# Patient Record
Sex: Female | Born: 1945 | Race: White | Hispanic: No | Marital: Married | Smoking: Former smoker
Health system: Southern US, Community
[De-identification: ages and names within clinical notes are randomized; demographics above are authoritative.]

## PROBLEM LIST (undated history)

## (undated) DIAGNOSIS — F419 Anxiety disorder, unspecified: Secondary | ICD-10-CM

## (undated) DIAGNOSIS — K859 Acute pancreatitis without necrosis or infection, unspecified: Secondary | ICD-10-CM

## (undated) DIAGNOSIS — Z86718 Personal history of other venous thrombosis and embolism: Secondary | ICD-10-CM

## (undated) DIAGNOSIS — I774 Celiac artery compression syndrome: Secondary | ICD-10-CM

## (undated) DIAGNOSIS — B029 Zoster without complications: Secondary | ICD-10-CM

## (undated) DIAGNOSIS — D689 Coagulation defect, unspecified: Secondary | ICD-10-CM

## (undated) DIAGNOSIS — M810 Age-related osteoporosis without current pathological fracture: Secondary | ICD-10-CM

## (undated) DIAGNOSIS — M8580 Other specified disorders of bone density and structure, unspecified site: Secondary | ICD-10-CM

## (undated) DIAGNOSIS — I771 Stricture of artery: Secondary | ICD-10-CM

## (undated) DIAGNOSIS — I499 Cardiac arrhythmia, unspecified: Secondary | ICD-10-CM

## (undated) DIAGNOSIS — H3530 Unspecified macular degeneration: Secondary | ICD-10-CM

## (undated) DIAGNOSIS — R609 Edema, unspecified: Secondary | ICD-10-CM

## (undated) DIAGNOSIS — K219 Gastro-esophageal reflux disease without esophagitis: Secondary | ICD-10-CM

## (undated) DIAGNOSIS — D735 Infarction of spleen: Secondary | ICD-10-CM

## (undated) DIAGNOSIS — M1990 Unspecified osteoarthritis, unspecified site: Secondary | ICD-10-CM

## (undated) DIAGNOSIS — C50919 Malignant neoplasm of unspecified site of unspecified female breast: Secondary | ICD-10-CM

## (undated) HISTORY — PX: MENISCUS REPAIR: SHX5179

## (undated) HISTORY — DX: Personal history of other venous thrombosis and embolism: Z86.718

## (undated) HISTORY — DX: Malignant neoplasm of unspecified site of unspecified female breast: C50.919

## (undated) HISTORY — DX: Zoster without complications: B02.9

## (undated) HISTORY — DX: Stricture of artery: I77.1

## (undated) HISTORY — DX: Coagulation defect, unspecified: D68.9

## (undated) HISTORY — DX: Anxiety disorder, unspecified: F41.9

## (undated) HISTORY — PX: BREAST LUMPECTOMY: SHX2

## (undated) HISTORY — PX: TONSILLECTOMY: SUR1361

## (undated) HISTORY — DX: Age-related osteoporosis without current pathological fracture: M81.0

## (undated) HISTORY — DX: Edema, unspecified: R60.9

## (undated) HISTORY — DX: Unspecified macular degeneration: H35.30

## (undated) HISTORY — DX: Unspecified osteoarthritis, unspecified site: M19.90

## (undated) HISTORY — DX: Celiac artery compression syndrome: I77.4

## (undated) HISTORY — DX: Infarction of spleen: D73.5

## (undated) HISTORY — DX: Other specified disorders of bone density and structure, unspecified site: M85.80

## (undated) HISTORY — DX: Cardiac arrhythmia, unspecified: I49.9

## (undated) HISTORY — DX: Acute pancreatitis without necrosis or infection, unspecified: K85.90

---

## 1998-02-26 ENCOUNTER — Other Ambulatory Visit: Admission: RE | Admit: 1998-02-26 | Discharge: 1998-02-26 | Payer: Self-pay | Admitting: *Deleted

## 1999-02-27 ENCOUNTER — Other Ambulatory Visit: Admission: RE | Admit: 1999-02-27 | Discharge: 1999-02-27 | Payer: Self-pay | Admitting: *Deleted

## 2000-03-31 ENCOUNTER — Other Ambulatory Visit: Admission: RE | Admit: 2000-03-31 | Discharge: 2000-03-31 | Payer: Self-pay | Admitting: *Deleted

## 2001-04-11 ENCOUNTER — Other Ambulatory Visit: Admission: RE | Admit: 2001-04-11 | Discharge: 2001-04-11 | Payer: Self-pay | Admitting: *Deleted

## 2001-04-12 ENCOUNTER — Other Ambulatory Visit: Admission: RE | Admit: 2001-04-12 | Discharge: 2001-04-12 | Payer: Self-pay | Admitting: Radiology

## 2001-04-22 ENCOUNTER — Encounter: Payer: Self-pay | Admitting: Surgery

## 2001-04-22 ENCOUNTER — Ambulatory Visit (HOSPITAL_BASED_OUTPATIENT_CLINIC_OR_DEPARTMENT_OTHER): Admission: RE | Admit: 2001-04-22 | Discharge: 2001-04-22 | Payer: Self-pay | Admitting: Surgery

## 2001-04-22 ENCOUNTER — Encounter (INDEPENDENT_AMBULATORY_CARE_PROVIDER_SITE_OTHER): Payer: Self-pay | Admitting: *Deleted

## 2001-05-02 ENCOUNTER — Ambulatory Visit: Admission: RE | Admit: 2001-05-02 | Discharge: 2001-07-31 | Payer: Self-pay | Admitting: Radiation Oncology

## 2001-11-02 HISTORY — PX: FOOT SURGERY: SHX648

## 2002-05-02 ENCOUNTER — Other Ambulatory Visit: Admission: RE | Admit: 2002-05-02 | Discharge: 2002-05-02 | Payer: Self-pay | Admitting: *Deleted

## 2002-05-18 ENCOUNTER — Ambulatory Visit (HOSPITAL_BASED_OUTPATIENT_CLINIC_OR_DEPARTMENT_OTHER): Admission: RE | Admit: 2002-05-18 | Discharge: 2002-05-18 | Payer: Self-pay | Admitting: Orthopedic Surgery

## 2002-10-19 ENCOUNTER — Ambulatory Visit (HOSPITAL_BASED_OUTPATIENT_CLINIC_OR_DEPARTMENT_OTHER): Admission: RE | Admit: 2002-10-19 | Discharge: 2002-10-19 | Payer: Self-pay | Admitting: Orthopedic Surgery

## 2003-11-19 ENCOUNTER — Other Ambulatory Visit: Admission: RE | Admit: 2003-11-19 | Discharge: 2003-11-19 | Payer: Self-pay | Admitting: *Deleted

## 2004-12-26 ENCOUNTER — Ambulatory Visit: Payer: Self-pay | Admitting: Oncology

## 2005-06-24 ENCOUNTER — Other Ambulatory Visit: Admission: RE | Admit: 2005-06-24 | Discharge: 2005-06-24 | Payer: Self-pay | Admitting: *Deleted

## 2005-06-29 ENCOUNTER — Ambulatory Visit: Payer: Self-pay | Admitting: Oncology

## 2005-12-29 ENCOUNTER — Ambulatory Visit: Payer: Self-pay | Admitting: Oncology

## 2006-05-31 ENCOUNTER — Other Ambulatory Visit: Admission: RE | Admit: 2006-05-31 | Discharge: 2006-05-31 | Payer: Self-pay | Admitting: Radiology

## 2006-07-01 ENCOUNTER — Ambulatory Visit: Payer: Self-pay | Admitting: Oncology

## 2006-07-06 LAB — COMPREHENSIVE METABOLIC PANEL
ALT: 23 U/L (ref 0–40)
BUN: 18 mg/dL (ref 6–23)
CO2: 26 mEq/L (ref 19–32)
Calcium: 9.5 mg/dL (ref 8.4–10.5)
Chloride: 104 mEq/L (ref 96–112)
Creatinine, Ser: 0.82 mg/dL (ref 0.40–1.20)
Glucose, Bld: 115 mg/dL — ABNORMAL HIGH (ref 70–99)

## 2006-07-06 LAB — CBC WITH DIFFERENTIAL/PLATELET
BASO%: 0.3 % (ref 0.0–2.0)
Basophils Absolute: 0 10*3/uL (ref 0.0–0.1)
Eosinophils Absolute: 0.3 10*3/uL (ref 0.0–0.5)
HCT: 41.3 % (ref 34.8–46.6)
HGB: 14.9 g/dL (ref 11.6–15.9)
LYMPH%: 17.5 % (ref 14.0–48.0)
MONO#: 0.6 10*3/uL (ref 0.1–0.9)
NEUT#: 6.6 10*3/uL — ABNORMAL HIGH (ref 1.5–6.5)
NEUT%: 72.7 % (ref 39.6–76.8)
Platelets: 312 10*3/uL (ref 145–400)
WBC: 9.1 10*3/uL (ref 3.9–10.0)
lymph#: 1.6 10*3/uL (ref 0.9–3.3)

## 2006-08-25 ENCOUNTER — Other Ambulatory Visit: Admission: RE | Admit: 2006-08-25 | Discharge: 2006-08-25 | Payer: Self-pay | Admitting: *Deleted

## 2007-11-30 ENCOUNTER — Other Ambulatory Visit: Admission: RE | Admit: 2007-11-30 | Discharge: 2007-11-30 | Payer: Self-pay | Admitting: *Deleted

## 2008-08-02 ENCOUNTER — Ambulatory Visit: Payer: Self-pay | Admitting: Internal Medicine

## 2008-08-30 ENCOUNTER — Ambulatory Visit: Payer: Self-pay | Admitting: Internal Medicine

## 2008-09-25 ENCOUNTER — Ambulatory Visit: Payer: Self-pay | Admitting: Internal Medicine

## 2008-11-13 ENCOUNTER — Ambulatory Visit: Payer: Self-pay | Admitting: Internal Medicine

## 2008-12-31 ENCOUNTER — Ambulatory Visit: Payer: Self-pay | Admitting: Internal Medicine

## 2009-01-22 ENCOUNTER — Ambulatory Visit: Payer: Self-pay | Admitting: Internal Medicine

## 2009-03-22 ENCOUNTER — Ambulatory Visit: Payer: Self-pay | Admitting: Internal Medicine

## 2009-04-18 ENCOUNTER — Ambulatory Visit: Payer: Self-pay | Admitting: Internal Medicine

## 2009-08-27 ENCOUNTER — Ambulatory Visit: Payer: Self-pay | Admitting: Internal Medicine

## 2009-10-31 ENCOUNTER — Ambulatory Visit: Payer: Self-pay | Admitting: Internal Medicine

## 2009-12-23 ENCOUNTER — Ambulatory Visit: Payer: Self-pay | Admitting: Internal Medicine

## 2010-01-23 ENCOUNTER — Ambulatory Visit: Payer: Self-pay | Admitting: Internal Medicine

## 2010-01-23 ENCOUNTER — Encounter: Payer: Self-pay | Admitting: Internal Medicine

## 2010-03-06 ENCOUNTER — Ambulatory Visit: Payer: Self-pay | Admitting: Internal Medicine

## 2010-03-06 DIAGNOSIS — J328 Other chronic sinusitis: Secondary | ICD-10-CM

## 2010-03-06 DIAGNOSIS — J309 Allergic rhinitis, unspecified: Secondary | ICD-10-CM | POA: Insufficient documentation

## 2010-03-06 LAB — CONVERTED CEMR LAB: IgE (Immunoglobulin E), Serum: 228.6 intl units/mL — ABNORMAL HIGH (ref 0.0–180.0)

## 2010-03-07 LAB — CONVERTED CEMR LAB
Basophils Absolute: 0 10*3/uL (ref 0.0–0.1)
Basophils Relative: 0.4 % (ref 0.0–3.0)
Eosinophils Absolute: 0.4 10*3/uL (ref 0.0–0.7)
Eosinophils Relative: 4.1 % (ref 0.0–5.0)
HCT: 43.2 % (ref 36.0–46.0)
Hemoglobin: 15.3 g/dL — ABNORMAL HIGH (ref 12.0–15.0)
Lymphocytes Relative: 20.1 % (ref 12.0–46.0)
Lymphs Abs: 2.1 10*3/uL (ref 0.7–4.0)
MCHC: 35.3 g/dL (ref 30.0–36.0)
MCV: 92.7 fL (ref 78.0–100.0)
Monocytes Absolute: 0.7 10*3/uL (ref 0.1–1.0)
Monocytes Relative: 6.9 % (ref 3.0–12.0)
Neutro Abs: 7.2 10*3/uL (ref 1.4–7.7)
Neutrophils Relative %: 68.5 % (ref 43.0–77.0)
Platelets: 296 10*3/uL (ref 150.0–400.0)
RBC: 4.66 M/uL (ref 3.87–5.11)
RDW: 12.4 % (ref 11.5–14.6)
WBC: 10.5 10*3/uL (ref 4.5–10.5)

## 2010-04-10 ENCOUNTER — Ambulatory Visit: Payer: Self-pay | Admitting: Internal Medicine

## 2010-04-25 ENCOUNTER — Encounter: Payer: Self-pay | Admitting: Internal Medicine

## 2010-07-17 ENCOUNTER — Ambulatory Visit: Payer: Self-pay | Admitting: Internal Medicine

## 2010-08-12 ENCOUNTER — Ambulatory Visit: Payer: Self-pay | Admitting: Internal Medicine

## 2010-12-04 NOTE — Assessment & Plan Note (Signed)
Summary: allergy problem/ mbw   Vital Signs:  Patient profile:   65 year old female Weight:      171 pounds O2 Sat:      96 % on Room air Pulse rate:   84 / minute BP sitting:   126 / 66  (left arm) Cuff size:   regular  Vitals Entered By: Reynaldo Minium CMA (Mar 06, 2010 2:45 PM)  O2 Flow:  Room air  Primary Provider/Referring Provider:  Baxley  CC:  Allergy Consult-Dr.Baxley.  History of Present Illness: Mar 06, 2010- 64 yoF seen courtesy of Dr Lenord Fellers, concerned about recurrent sinus problems and possible allergy. She denies any hx of allergy or sinus problem before about 8 years ago when she had dental surgery on an upper front tooth- relevance unclear.. Since then she's had occasional congestion especially in Winter and Spring. In the last 2 years this has been more pronounced and maybe less clearly seasonal. She describes rhinorhea, sneeze, watery eyes, ear pressure. Occasionally some dyspnea, but no cough or wheeze. She denies bloody or purulent discharge. Treated repeatedly with Z paks for otitis and sinusitis, but says it doesn't seem to work as well as it did.   Current Medications (verified): 1)  Spironolactone 100 Mg Tabs (Spironolactone) .... Take 1 By Mouth Once Daily 2)  Ocuvite  Tabs (Multiple Vitamins-Minerals) .... Take 1 By Mouth Once Daily 3)  B-100 Complex  Tabs (Vitamins-Lipotropics) .... Take 1 By Mouth Once Daily 4)  Caltrate 600+d 600-400 Mg-Unit Tabs (Calcium Carbonate-Vitamin D) .... Take 1 By Mouth Once Daily  Allergies (verified): 1)  ! Codeine 2)  ! Pcn 3)  ! * Tequin 4)  ! Left Arm Bp Ck Only  Family History: Cancer-self Heart Disease-Aunt  Social History: Married with children Smoked x 38 years 1ppd; quit 2002 Self Employed   Impression & Recommendations:  Problem # 1:  ALLERGIC RHINITIS (ICD-477.9)  Adutlt onset rhinitis with waqtering eyues,eustachian dysfunction. She doesn't clearly recognize a pattrern beyong Winter/Spring, so much  of this may be viral.She has had a lot of Z paks and I can't exclude a choronic sinusitis. Eventually she may need a sinus CT. For now we will send an IgE assay and let her try allegra or Claritin, since she doesn't like the way Zyrtec makes her feel. Consider a steroid nasal spray and/ or Singulair. Consider skin testing.  Orders: Consultation Level IV [id number]) T-Allergy Profile Region II-[place], [place], [place], [place], [place] (5484) TLB-CBC Platelet - w/Differential (85025-CBCD)  Medications Added to Medication List This Visit: 1)  Spironolactone 100 Mg Tabs (Spironolactone) .... Take 1 by mouth once daily 2)  Ocuvite Tabs (Multiple vitamins-minerals) .... Take 1 by mouth once daily 3)  B-100 Complex Tabs (Vitamins-lipotropics) .... Take 1 by mouth once daily 4)  Caltrate 600+d 600-400 Mg-unit Tabs (Calcium carbonate-vitamin d) .... Take 1 by mouth once daily  Patient Instructions: 1)  Please schedule a follow-up appointment in 1 month. 2)  Consider a different antihistamine instead of Zyrtec since you don't like the way it makes you feel. 3)  Allegra/ fexofenadine  12 or 24 hour 4)  Claritin/ loratadine 5)  Try otc decongestant like Sudafed or Sudafed-PE 6)  Lab  Appended Document: allergy problem/ mbw     Primary Provider/Referring Provider:  Baxley   History of Present Illness: This is a rewrite- original closed before proof reading. 03/06/10- 64yoF seen in allergy consultation at kind request of Dr Lenord Fellers, concerned about recurrent sinus problems  and possible allergy. She denies any hx of allergy or sinus problems before about 8 years ago,when she had dental surgery on an upper front tooth- relevance unclear. Since then she's had occasional congestion especialy in winter and spring. In the last 2 years this has been more pronounced and maybe less clearly seasonal. She describes rhinorhea, sneeze, wateryeyes, ear pressure. Occasionally some dyspnea, but no cough or wheeze.She denies bloody or purulent  discharge. She snores withut reported apnea. Treated several times for sinus headache and otitis with Zpaks, but says it doesn't seem to work as well as it did.  Zyrtec and sudafed worked well, but she blamed them for stomach upset, possibly taking antibioitic at the same  time. Dislikes the way Zyrtec makes her feel less energetic. had take Miacalcin in the past but it irritated her nose. Prednisone gave her too much energy- unpleasant. Has had episodes of epsitaxis for which she will use short term Afrin. Has had 2 episodes of vertigo. There had been concern about water under the house in past- remediated and now dry without mold or mildew.  Allergies: 1)  ! Codeine 2)  ! Pcn 3)  ! * Tequin 4)  ! 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  The patient denies anorexia, fever, weight loss, weight gain, vision loss, decreased hearing, hoarseness, chest pain, syncope, peripheral edema, prolonged cough, hemoptysis, abdominal pain, and severe indigestion/heartburn.         palpitation toothache  Vital Signs:  Patient profile:   65 year old female Weight:      171 pounds O2 Sat:      96 % on Room air Pulse rate:   84 / minute Resp:     18 per minute BP sitting:   126 / 66  (left arm) Cuff size:   regular  O2 Flow:  Room air  Physical  Exam  Additional Exam:  General: A/Ox3; pleasant and cooperative, NAD, calm SKIN: no rash, lesions NODES: no lymphadenopathy HEENT: [place]/AT, EOM- WNL, Conjuctivae- clear, PERRLA, TM-WNL, Nose- turbinate edema, Throat- clear and wnl, Mallampati  III, no drainage NECK: Supple w/ fair ROM, JVD- none, normal carotid impulses w/o bruits Thyroid- normal to palpation CHEST: Clear to P&A HEART: RRR, no m/g/r heard ABDOMEN: Soft and nl; nml bowel sounds; no organomegaly or masses noted GNF:AOZH, nl pulses, no edema  NEURO: Grossly intact to observation      Impression & Recommendations:  Problem # 1:  RHINOSINUSITIS, CHRONIC (ICD-473.8)  Adult onset rhinitis with watering eyes,eustachian dysfunction. She doesn't clearly recognize a pattern beyond Winter/Spring, so much of this may be viral. She has had a lot of Z paks and I can't exclude a chronic sinusitis. Eventually she may need a sinus CT. For now we will send an IgE assay and let her try allegra or Claritin, since she doesn't like the way  Zyrtec makes her feel. Consider a steroid nasal spray and/ or Singulair. Consider skin testing.  Orders: Consultation Level IV (16109)  Other Orders: TLB-CBC Platelet - w/Differential (85025-[id number]) T-Allergy Profile Region II-[place], [place], [place], [place], [place] [id number])  Patient Instructions: 1)  Please schedule a follow-up appointment in 1 month. 2)  Consider a different antihistamine for trial instead of Zyrtec, since you don't like the way it makes you feel. 3)  Claritin/ loratadine 4)  Allegra/ fexofenadine 12 or 24 hour 5)  Lab

## 2010-12-04 NOTE — Letter (Signed)
Summary: Date Range: 12-23-09 to 01-23-10/Mary Waymond Cera MD  Date Range: 12-23-09 to 01-23-10/Mary Waymond Cera MD   Imported By: Sherian Rein 03/10/2010 10:52:37  _____________________________________________________________________  External Attachment:    Type:   Image     Comment:   External Document

## 2010-12-04 NOTE — Assessment & Plan Note (Signed)
Summary: rov 1 month///kp   Primary Provider/Referring Provider:  Lenord Fellers  CC:  1 month follow up visit-allergies. .  History of Present Illness: CC:  Allergy Consult-Dr.Baxley.  History of Present Illness: Mar 06, 2010- 65 yoF seen courtesy of Dr Lenord Fellers, concerned about recurrent sinus problems and possible allergy. She denies any hx of allergy or sinus problem before about 8 years ago when she had dental surgery on an upper front tooth- relevance unclear.. Since then she's had occasional congestion especially in Winter and Spring. In the last 2 years this has been more pronounced and maybe less clearly seasonal. She describes rhinorhea, sneeze, watery eyes, ear pressure. Occasionally some dyspnea, but no cough or wheeze. She denies bloody or purulent discharge. Treated repeatedly with Z paks for otitis and sinusitis, but says it doesn't seem to work as well as it did.  April 10, 2010- Recurrent rhinosinusitis As usual, she is doing well in the summer. We discussed her experience with congestion and need for Sudafed especially in the winter time. In the winter she gets a "sinus" headache, that she notes inmproves on travel to [place], and worsens again as they come back into the [place], not necessarily back to her home.  Miacalcin irritated her nose- epistaxis and made eyes water. Admits some tendency to snore.   Preventive Screening-Counseling & Management  Alcohol-Tobacco     Smoking Status: quit     Year Started: 1968     Year Quit: 2003  Current Medications (verified): 1)  Spironolactone 100 Mg Tabs (Spironolactone) .... Take 1 By Mouth Once Daily 2)  Ocuvite  Tabs (Multiple Vitamins-Minerals) .... Take 1 By Mouth Once Daily 3)  B-100 Complex  Tabs (Vitamins-Lipotropics) .... Take 1 By Mouth Once Daily 4)  Caltrate 600+d 600-400 Mg-Unit Tabs (Calcium Carbonate-Vitamin D) .... Take 1 By Mouth Once Daily 5)  Vision Formula  Tabs (Multiple Vitamins-Minerals) .... Take 2 By Mouth  Once Daily  Allergies (verified): 1)  ! Codeine 2)  ! Pcn 3)  ! * Tequin 4)  ! 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  The patient denies shortness of breath with activity, shortness of breath at rest, productive cough, non-productive cough, coughing up blood, chest pain, irregular heartbeats, acid heartburn, indigestion, loss of appetite, weight change, abdominal pain, difficulty swallowing, sore throat, tooth/dental problems, and headaches.    Vital Signs:  Patient profile:   65 year old female O2 Sat:      97 % on Room air Pulse rate:   86 / minute BP sitting:   122 / 74  (left arm) Cuff size:   regular  Vitals Entered By: Reynaldo Minium CMA (April 10, 2010 1:57 PM)  O2 Flow:  Room air CC: 1 month follow up visit-allergies.    Physical Exam  Additional Exam:  General: A/Ox3; pleasant and cooperative, NAD, calm SKIN:  no rash, lesions NODES: no lymphadenopathy HEENT: [place]/AT, EOM- WNL, Conjuctivae- clear, PERRLA, TM-WNL, Nose- turbinate edema, Throat- clear and wnl, Mallampati  III, no drainage NECK: Supple w/ fair ROM, JVD- none, normal carotid impulses w/o bruits Thyroid- normal to palpation CHEST: Clear to P&A HEART: RRR, no m/g/r heard ABDOMEN: Soft and nl; ZOX:WRUE, nl pulses, no edema  NEURO: Grossly  intact to observation      Impression & Recommendations:  Problem # 1:  ALLERGIC RHINITIS (ICD-477.9)  Based on specific IgE I can't identify an allergy pattern to explain winter symptoms and her elevated total IgE. She is worried about hx of epistaxis that may have been related to winter drying and to Miacalcin. She is interested in seeing if an ENT  could cauterize a bleeding-prone area before next winter. Family had a good general ENT experience with Dr Dorma Russell. If there isn't much concern about epistaxis, I would think to offer a nasal streroid next winter if needed.  Problem # 2:  RHINOSINUSITIS, CHRONIC (ICD-473.8) There may be a chronic irritant/ vasomotor component. anatomic narrowing can be assessed on ENT exam.  Medications Added to Medication List This Visit: 1)  Vision Formula Tabs (Multiple vitamins-minerals) .... Take 2 by mouth once daily  Other Orders: Est. Patient Level IV (45409) ENT Referral (ENT)  Patient Instructions: 1)  Please schedule a follow-up appointment as needed. 2)  See [facility] for ENT referral

## 2010-12-04 NOTE — Consult Note (Signed)
Summary: [facility]  [facility]   Imported By: Sherian Rein 05/01/2010 07:56:41  _____________________________________________________________________  External Attachment:    Type:   Image     Comment:   External Document

## 2010-12-09 ENCOUNTER — Ambulatory Visit (INDEPENDENT_AMBULATORY_CARE_PROVIDER_SITE_OTHER): Payer: Medicare Other | Admitting: Internal Medicine

## 2010-12-09 DIAGNOSIS — J069 Acute upper respiratory infection, unspecified: Secondary | ICD-10-CM

## 2010-12-09 DIAGNOSIS — H6690 Otitis media, unspecified, unspecified ear: Secondary | ICD-10-CM

## 2011-03-20 NOTE — Op Note (Signed)
[place]  Patient:    Annette Beltran, Annette Beltran                          MRN: [id number] Proc. Date: 04/22/01 Adm. Date:  [id number] Disc. Date: [id number] Attending:  Charlton Haws CC:         Darius Bump, M.D.  Almedia Balls. Randell Patient, M.D.  Jeralyn Ruths, M.D.   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  PROCEDURE:  Right partial mastectomy with sentinel lymph node dissection and blue dye injection.  SURGEON:  Currie Paris, M.D.  ANESTHESIA:  General.  CLINICAL HISTORY:  This patient is a 65 year old who was recently found to have a right breast mass which on biopsy showed carcinoma, possibly tubular. After discussion with the patient and alternatives we elected to proceed to a right partial mastectomy/lumpectomy with blue dye injection and sentinel node dissection.  DESCRIPTION OF PROCEDURE:  The patient was seen in the holding area and questions reviewed and answered.  She was then taken to the operating room and after satisfactory general anesthesia the right breast was prepped and draped. Approximately 4 cc lymphazurin blue dye was injected subareolarly and a little bit in the dermis in the prior biopsy site in the upper outer quadrant of the breast.  This was massaged in for a few minutes.  Then using a neoprobe I found a hot area in the axilla, made a transverse low axillary incision directly over this.  The subcutaneous tissues were divided and as I got into the axillary fat I did see two blue lymphatics both of which I traced a little further up into the axillae and found a cluster of blue nodes that I could not really separate one from the other but which were quite hot.  These were pulled out en block and I trimmed some of the fat off but could not really identify  the planes of dissection readily.  I sent this as a hot blue sentinel nodes.  The second blue lymphatic, a little bit posteriorly, was identified, and using a neoprobe I found another hot area adjacent to that and found what appeared to be two nodes, again stuck together and these were excised.  There was no palpable other adenopathy nor any other blue hot areas and counts returned to baseline at 0-15 with the nodes being as high as 1500.  A pack was placed here while waiting for pathology.  Attention was returned to the breast.  There was a prior core biopsy site in the upper outer quadrant and this was directly over the tumor so I made an elliptical incision over and to include the core site.  Using cautery I took a wide excision going down the chest wall leaving only a few mm of fat on the deep margin.  By palpation I felt like I was well around this in all directions although it felt a little close along the superior margins so I took another 3 mm of superior margin.  I was almost into the axillae in doing so and there was really no breast tissue there.  There was some breast tissue inferiorly but it was felt to be normal.  This was sent for touch preps and for pathology to look at.  The wound was irrigated.  I put some  clips in to mark the margins of excision and closed with some 3-0 Vicryl.  The axillary incision was also closed with 3-0 Vicryl.  The skin was closed with 4-0 monacryl in both sites.  I injected with Marcaine prior to closing to help with postoperatively analgesia.  The patient tolerated the procedure well.  Pathology reported that the sentinel nodes were negative and the margin appeared to be adequate on examination and sectioning of the specimen.  The patient tolerated the procedure well.  There were no operative complications.  All counts were correct. DD:  04/22/01 TD:  04/24/01 Job: [id number]

## 2011-03-20 NOTE — Op Note (Signed)
Annette Beltran, Annette Beltran                           ACCOUNT NO.:  [id number]   MEDICAL RECORD NO.:  [id number]                   PATIENT TYPE:  AMB   LOCATION:  DSC                                  FACILITY:  [facility]   PHYSICIAN:  Nadara Mustard, M.D.                DATE OF BIRTH:  November 09, 1945   DATE OF PROCEDURE:  10/19/2002  DATE OF DISCHARGE:                                 OPERATIVE REPORT   PREOPERATIVE DIAGNOSES:  Hallux rigidus, left great toe.   POSTOPERATIVE DIAGNOSES:  Hallux rigidus, left great toe.   PROCEDURE:  Left great toe cheilectomy.   SURGEON:  Nadara Mustard, M.D.   ANESTHESIA:  LMA.   ESTIMATED BLOOD LOSS:  Minimal.   TOURNIQUET TIME:  Esmarch at the ankle for approximately 22 minutes.   ANTIBIOTICS:  1 gm of Kefzol.   DISPOSITION:  To PACU in a stable condition.   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  She is status post a fusion of the  right great toe and presents at this time for a cheilectomy of the left  great toe.  She does have a noncongruent joint with significant degenerative  changes.  The options of cheilectomy versus fusion were discussed, and the  patient wished to proceed with a cheilectomy.  The risks and benefits were  discussed, including infection, neurovascular injury, persistent pain, and  need for additional surgery.  The patient states that he understands and  wishes to proceed at this time.   PROCEDURE:  The patient was brought to OR room #5 and underwent a general  LMA anesthetic.  After an adequate level of anesthesia had been obtained,  the patient's left lower extremity was prepped using Duraprep and draped in  a sterile field, and Ioban was used to cover all exposed skin.  A dorsal  incision was made longitudinally over the MCP joint of the great toe.  This  was carried down to the joint.  A subperiosteal dissection was then  performed around the edges  of the joint to expose the MCP joint.  Using an  oscillating saw and an osteotome, the distal osteophytic spurs were removed  from the distal metatarsal head.  Osteophytic spurs off the proximal first  phalanx were also removed, and ostephytic spurs off the medial and lateral  edges of the MCP joint were also debrided on both sides of the joint.  The  wound was irrigated with normal saline.  Loose bodies were removed.  The  patient preoperatively had a total of 10 degrees of range of motion, and  postoperatively, he had 70 degrees of dorsiflexion.  After the wound was  irrigated, the incision was closed with a vertical mattress with 2-0 nylon.  The wound was covered with Adaptic orthopedic sponges, sterile Webril, and a  loosely wrapped Coban  dressing was  applied.  The patient was extubated and taken to PACU in stable condition.  Plans for non-weightbearing.  Followup in the office in two weeks.  She may  bathe at three weeks and weightbearing as tolerated at two weeks with  progressive range of motion.                                               Nadara Mustard, M.D.    MVD/MEDQ  D:  10/19/2002  T:  10/20/2002  Job:  [id number]

## 2011-03-20 NOTE — Op Note (Signed)
[place]  Patient:    Annette Beltran, Annette Beltran Visit Number: [id number] MRN: [id number]          Service Type: DSU Location: [facility] Attending Physician:  Nadara Mustard Dictated by:   Nadara Mustard, M.D. Proc. Date: 05/18/02 Admit Date:  05/18/2002 Discharge Date: 05/18/2002                             Operative Report  PREOPERATIVE DIAGNOSIS:  Severe hallux rigis, right great toe.  POSTOPERATIVE DIAGNOSIS:  Severe hallux rigis, right great toe.  PROCEDURE:  Right great toe MTP fusion.  SURGEON:  Nadara Mustard, M.D.  ANESTHESIA:  General LMA.  ESTIMATED BLOOD LOSS:  Minimal.  ANTIBIOTICS: One gram of Kefzol.  TOURNIQUET TIME:  Forty-one minutes with the Esmarch at the ankle.  DRAINS:  None.  COMPLICATIONS:  None.  DISPOSITION:  To PACU in stable condition.  INDICATIONS OF PROCEDURE:  The patient is a 65 year old woman who is status post a cheilectomy for a hallux rigis of her right great toe.  The patient has failed further conservative care.  She has essentially no range of motion of the MTP joint.  She has large osteophytic bone spurs and presents at this time for surgical intervention.  The risks and benefits were discussed including infection, neurovascular injury, persistent pain, failure of the fusion, or need for additional surgery.  The patient states that she understands and wishes to proceed at this time.  DESCRIPTION OF PROCEDURE:  The patient was brought to OR #5 and underwent a general anesthetic.  After adequate level of anesthesia was obtained, the patients right lower extremity was prepped using DuraPrep and draped into a sterile field.  Collier Flowers was used to cover all exposed skin.  She previously had a dorsal curvilinear incision which was used for her chielectomy.  While this was not in the optimal position, this was used to minimize the risk of wound problems due to the small area of the toe.  The previous incision was  used. This was carried sharply down to bone.  The large osteophytes were removed using an oscillating saw and an osteotome.  The distal aspects of both bone articular surfaces were removed and the toe was placed in about 20 degrees of dorsiflexion equal to the dorsiflexion of the other toes in slight valgus. This was stabilized with a K-wire and then was further stabilized with two cortical screws, 3.5 cortical screws used in a lag screw technique.  C-arm fluoroscopy verified reduction in the AP and lateral planes.  The great toe was plantar grade with the lesser toes.  The wound was irrigated with normal saline.  The wound was closed using a vertical mattress with 2-0 nylon with no tension on the skin.  The wound was covered with Adaptic orthopedic sponges, sterile web roll, and a loosely wrapped Coban.  The patient was extubated and taken to the PACU in stable condition with plan for postoperative shoe, nonweight-bearing.  Follow up in the office in one week.  The Esmarch was used at the ankle for tourniquet control and it was used for approximately 41 minutes.  The patient has prescriptions at home for pain medications. Follow up in the office in one week. Dictated by:   Nadara Mustard, M.D. Attending Physician:  Nadara Mustard DD:  05/18/02 TD:  05/23/02 Job: [id number]

## 2011-06-07 ENCOUNTER — Emergency Department (HOSPITAL_COMMUNITY): Payer: Medicare Other

## 2011-06-07 ENCOUNTER — Inpatient Hospital Stay (HOSPITAL_COMMUNITY)
Admission: EM | Admit: 2011-06-07 | Discharge: 2011-06-11 | DRG: 815 | Disposition: A | Discharge status: Home / Self Care | Payer: Medicare Other | Attending: Internal Medicine | Admitting: Internal Medicine

## 2011-06-07 DIAGNOSIS — Z880 Allergy status to penicillin: Secondary | ICD-10-CM

## 2011-06-07 DIAGNOSIS — I708 Atherosclerosis of other arteries: Secondary | ICD-10-CM | POA: Diagnosis present

## 2011-06-07 DIAGNOSIS — D7389 Other diseases of spleen: Principal | ICD-10-CM | POA: Diagnosis present

## 2011-06-07 DIAGNOSIS — Z923 Personal history of irradiation: Secondary | ICD-10-CM

## 2011-06-07 DIAGNOSIS — I774 Celiac artery compression syndrome: Secondary | ICD-10-CM | POA: Diagnosis present

## 2011-06-07 DIAGNOSIS — Z853 Personal history of malignant neoplasm of breast: Secondary | ICD-10-CM

## 2011-06-07 DIAGNOSIS — Z79899 Other long term (current) drug therapy: Secondary | ICD-10-CM

## 2011-06-07 LAB — URINALYSIS, ROUTINE W REFLEX MICROSCOPIC
Bilirubin Urine: NEGATIVE
Ketones, ur: NEGATIVE mg/dL
Nitrite: NEGATIVE
Protein, ur: NEGATIVE mg/dL

## 2011-06-07 LAB — DIFFERENTIAL
Basophils Relative: 0 % (ref 0–1)
Eosinophils Relative: 1 % (ref 0–5)
Lymphs Abs: 1.8 10*3/uL (ref 0.7–4.0)
Monocytes Absolute: 0.9 10*3/uL (ref 0.1–1.0)
Monocytes Relative: 7 % (ref 3–12)

## 2011-06-07 LAB — COMPREHENSIVE METABOLIC PANEL
ALT: 25 U/L (ref 0–35)
AST: 28 U/L (ref 0–37)
Albumin: 4.1 g/dL (ref 3.5–5.2)
Alkaline Phosphatase: 52 U/L (ref 39–117)
BUN: 13 mg/dL (ref 6–23)
CO2: 25 mEq/L (ref 19–32)
Calcium: 10 mg/dL (ref 8.4–10.5)
Chloride: 100 mEq/L (ref 96–112)
Creatinine, Ser: 0.55 mg/dL (ref 0.50–1.10)
GFR calc Af Amer: >60 mL/min (ref >60)
GFR calc non Af Amer: >60 mL/min (ref >60)
Glucose, Bld: 100 mg/dL — ABNORMAL HIGH (ref 70–99)
Potassium: 4.1 mEq/L (ref 3.5–5.1)
Sodium: 136 mEq/L (ref 135–145)
Total Bilirubin: 0.5 mg/dL (ref 0.3–1.2)
Total Protein: 7.7 g/dL (ref 6.0–8.3)

## 2011-06-07 LAB — OCCULT BLOOD, POC DEVICE: Fecal Occult Bld: NEGATIVE

## 2011-06-07 LAB — CBC
HCT: 40.1 % (ref 36.0–46.0)
Hemoglobin: 14.3 g/dL (ref 12.0–15.0)
MCH: 31.3 pg (ref 26.0–34.0)
MCHC: 35.7 g/dL (ref 30.0–36.0)
RBC: 4.57 MIL/uL (ref 3.87–5.11)

## 2011-06-07 LAB — LIPASE, BLOOD: Lipase: 51 U/L (ref 11–59)

## 2011-06-07 MED ORDER — IOHEXOL 300 MG/ML  SOLN
100.0000 mL | Freq: Once | INTRAMUSCULAR | Status: AC | PRN
  Administered 2011-06-07: 100 mL via INTRAVENOUS

## 2011-06-08 ENCOUNTER — Inpatient Hospital Stay (HOSPITAL_COMMUNITY): Payer: Medicare Other

## 2011-06-08 DIAGNOSIS — D689 Coagulation defect, unspecified: Secondary | ICD-10-CM

## 2011-06-08 LAB — COMPREHENSIVE METABOLIC PANEL
ALT: 21 U/L (ref 0–35)
AST: 21 U/L (ref 0–37)
CO2: 27 mEq/L (ref 19–32)
Calcium: 8.9 mg/dL (ref 8.4–10.5)
Chloride: 100 mEq/L (ref 96–112)
GFR calc Af Amer: >60 mL/min (ref >60)
GFR calc non Af Amer: >60 mL/min (ref >60)
Glucose, Bld: 102 mg/dL — ABNORMAL HIGH (ref 70–99)
Sodium: 135 mEq/L (ref 135–145)
Total Bilirubin: 0.8 mg/dL (ref 0.3–1.2)

## 2011-06-08 LAB — MONONUCLEOSIS SCREEN: Mono Screen: NEGATIVE

## 2011-06-08 MED ORDER — IOHEXOL 300 MG/ML  SOLN
100.0000 mL | Freq: Once | INTRAMUSCULAR | Status: AC | PRN
  Administered 2011-06-08: 100 mL via INTRAVENOUS

## 2011-06-08 NOTE — H&P (Signed)
Annette Beltran, Annette Beltran               ACCOUNT NO.:  [id number]  MEDICAL RECORD NO.:  [id number]  LOCATION:  WLED                         FACILITY:  [facility]  PHYSICIAN:  Gery Pray, MD      DATE OF BIRTH:  Jan 26, 1946  DATE OF ADMISSION:  06/07/2011 DATE OF DISCHARGE:                             HISTORY & PHYSICAL   PRIMARY CARE PHYSICIAN:  Luanna Cole. Lenord Fellers, MD  GASTROENTEROLOGIST:  Hedwig Morton. Juanda Chance, MD with [employer].  CODE STATUS:  Full code.  The patient goes to team 1.  CHIEF COMPLAINT:  Left upper quadrant abdominal pain.  HISTORY OF PRESENT ILLNESS:  This is a pleasant 66 year old female who states that she woke up this morning and had left upper quadrant abdominal pain. She states that she has never had this before.  She has no fever or chills.  No nausea.  No vomiting.  No diarrhea.  It was not associated with food. She had no cough.  She may have had a mild shortness of breath, mostly associated with the pain.  She had no burning urination.  The pain became progressively worse, it was constant, it was dull, at its worst ranges of 8/10 to 9/10.  She became concerned and came to the ER. History provided by the patient who is alert and oriented.  Husband present at bedside.  PAST MEDICAL HISTORY:  Stage I breast cancer over 10 years ago, treated with lumpectomy and radiation.  She also has a history of colitis.  PAST SURGICAL HISTORY:  Lumpectomy and 3 foot surgeries.  MEDICATIONS:  Spironolactone and Xanax p.r.n.  ALLERGIES: 1. CODEINE. 2. PENICILLIN. 3. TEQUIN.  SOCIAL HISTORY:  Negative for tobacco, alcohol, or illicit drugs.  The patient is married to Annette Beltran who is at the bedside.  FAMILY HISTORY:  Significant for cancer and osteoporosis.  REVIEW OF SYSTEMS:  All 10-point systems reviewed are negative except as noted in HPI.  PHYSICAL EXAMINATION:  VITAL SIGNS:  Blood pressure 152/85, pulse 65, respirations 16, temperature 97.7. GENERAL:  Alert and  oriented female, currently in no acute distress. EYES:  Pink conjunctivae.  PERRLA.  No scleral icterus. ENT:  Moist oral mucosa.  Trachea midline.  No obvious dental caries. NECK:  Supple.  Good range of motion.  No thyromegaly. LUNGS:  Clear to auscultation bilaterally.  No wheeze.  No use of accessory muscles. CARDIOVASCULAR:  Regular rate and rhythm without murmurs, rubs, or gallops.  No JVD. ABDOMEN:  Soft.  Positive bowel sounds.  Positive tenderness to palpation in the left upper quadrant.  No guarding.  No rebound. No acute surgical abdomen.  I am unable to assess organomegaly. NEUROLOGIC:  Cranial nerves II through XII grossly intact.  Sensation intact.  MUSCULOSKELETAL: Strength 5/5 in all extremities.  No clubbing, cyanosis, or edema. SKIN:  No rashes.  No subcutaneous crepitation.  No decubitus. PSYCHIATRIC:  The patient is alert, oriented.  No homicidal or suicidal ideations.  LABORATORY DATA:  CT abdomen and pelvis shows wedge-shape hyper enhancing splenic lesions, suspicious for splenic infarcts.  White blood count 12.6, hemoglobin 14.3, platelets 253.  UA is negative with rare bacteria, white blood cells 0-2.  Occult blood  is negative.  Sodium 136, potassium 4.1, chloride 100, CO2 of 25, glucose 100, BUN 13, creatinine 0.55.  LFTs are normal.  Lipase is 51.  EKG shows normal sinus rhythm.  ASSESSMENT AND PLAN: 1. Left upper quadrant pain. 2. Splenic infarcts.  It is unclear what could be the possible     etiology for the patient's splenic infarct. Different causes could     be some sort of an emboli, ?septic emboli vs cardiac.  The patient has     normal sinus rhythm.  No atrial fibrillation on her EKG.  We will     go ahead and order blood cultures to evaluate for infection,     however, the patient is afebrile.  Presence of a hypercoagulable     state such as malignancy, antiphospholipid syndrome could cause     this.  The patient does have a history of breast  cancer.  I will go     ahead and order tumor markers and do an anticoagulation workup.     Other causes could be lymphoma, however, the patient's CT scan is     not showing any evidence of this.  The patient has had no recent     trauma to her abdomen, therefore, this is not a possible cause.     Another possible cause could be infectious mononucleosis which     seems unlikely in this case, but I will go ahead and order     monospot. 3. History of breast cancer. 4. Colitis, stable.          ______________________________ Gery Pray, MD     DC/MEDQ  D:  06/08/2011  T:  06/08/2011  Job:  [id number]  Electronically Signed by Gery Pray MD on 06/08/2011 04:53:06 AM

## 2011-06-09 LAB — CBC
HCT: 38.3 % (ref 36.0–46.0)
MCH: 31.3 pg (ref 26.0–34.0)
MCHC: 35.2 g/dL (ref 30.0–36.0)
MCV: 88.9 fL (ref 78.0–100.0)
Platelets: 234 10*3/uL (ref 150–400)
RDW: 12 % (ref 11.5–15.5)

## 2011-06-09 LAB — COMPREHENSIVE METABOLIC PANEL
Alkaline Phosphatase: 53 U/L (ref 39–117)
BUN: 9 mg/dL (ref 6–23)
Calcium: 9.2 mg/dL (ref 8.4–10.5)
Creatinine, Ser: 0.62 mg/dL (ref 0.50–1.10)
GFR calc Af Amer: >60 mL/min (ref >60)
Glucose, Bld: 96 mg/dL (ref 70–99)
Potassium: 3.8 mEq/L (ref 3.5–5.1)
Total Protein: 7.2 g/dL (ref 6.0–8.3)

## 2011-06-09 LAB — DIFFERENTIAL
Eosinophils Absolute: 0.3 10*3/uL (ref 0.0–0.7)
Eosinophils Relative: 3 % (ref 0–5)
Lymphocytes Relative: 15 % (ref 12–46)
Lymphs Abs: 1.8 10*3/uL (ref 0.7–4.0)
Monocytes Absolute: 1 10*3/uL (ref 0.1–1.0)
Monocytes Relative: 9 % (ref 3–12)

## 2011-06-09 LAB — PROTEIN C ACTIVITY: Protein C Activity: 165 % — ABNORMAL HIGH (ref 75–133)

## 2011-06-09 LAB — SEDIMENTATION RATE: Sed Rate: 25 mm/hr — ABNORMAL HIGH (ref 0–22)

## 2011-06-10 ENCOUNTER — Inpatient Hospital Stay (HOSPITAL_COMMUNITY): Payer: Medicare Other

## 2011-06-10 ENCOUNTER — Inpatient Hospital Stay: Payer: Medicare Other

## 2011-06-10 LAB — CBC
MCH: 31.6 pg (ref 26.0–34.0)
MCHC: 36 g/dL (ref 30.0–36.0)
Platelets: 223 10*3/uL (ref 150–400)
RBC: 4.27 MIL/uL (ref 3.87–5.11)

## 2011-06-10 LAB — DIFFERENTIAL
Basophils Relative: 1 % (ref 0–1)
Eosinophils Absolute: 0.3 10*3/uL (ref 0.0–0.7)
Monocytes Absolute: 1 10*3/uL (ref 0.1–1.0)
Monocytes Relative: 10 % (ref 3–12)
Neutrophils Relative %: 66 % (ref 43–77)

## 2011-06-10 LAB — BASIC METABOLIC PANEL
CO2: 24 mEq/L (ref 19–32)
Calcium: 9.1 mg/dL (ref 8.4–10.5)
Creatinine, Ser: 0.6 mg/dL (ref 0.50–1.10)

## 2011-06-10 LAB — HEPARIN LEVEL (UNFRACTIONATED): Heparin Unfractionated: 0.3 IU/mL (ref 0.30–0.70)

## 2011-06-10 LAB — FACTOR 5 LEIDEN

## 2011-06-10 LAB — HEPATITIS B SURFACE ANTIGEN: Hepatitis B Surface Ag: NEGATIVE

## 2011-06-10 MED ORDER — IOHEXOL 300 MG/ML  SOLN: 150.0000 mL | Freq: Once | INTRAMUSCULAR | Status: AC | PRN

## 2011-06-10 MED ORDER — IOHEXOL 300 MG/ML  SOLN
150.0000 mL | Freq: Once | INTRAMUSCULAR | Status: AC | PRN
  Administered 2011-06-10: 95 mL via INTRA_ARTERIAL

## 2011-06-11 ENCOUNTER — Telehealth: Payer: Self-pay | Admitting: Internal Medicine

## 2011-06-11 LAB — ANTIPHOSPHOLIPID SYNDROME EVAL, BLD
Anticardiolipin IgA: 3 APL U/mL — ABNORMAL LOW (ref <22)
Anticardiolipin IgG: 11 GPL U/mL — ABNORMAL LOW (ref <23)
Anticardiolipin IgM: 7 MPL U/mL — ABNORMAL LOW (ref <11)
Lupus Anticoagulant: NOT DETECTED
Phosphatydalserine, IgA: <20 U/mL (ref <20)
Phosphatydalserine, IgM: <25 U/mL (ref <25)

## 2011-06-11 NOTE — Telephone Encounter (Signed)
We will see her Monday. Have spoken with her by phone Monday and Tuesday while she was hospitalized and cared for by Hospitalists.

## 2011-06-12 ENCOUNTER — Encounter: Payer: Self-pay | Admitting: Internal Medicine

## 2011-06-12 ENCOUNTER — Other Ambulatory Visit: Payer: Self-pay | Admitting: Oncology

## 2011-06-12 DIAGNOSIS — D735 Infarction of spleen: Secondary | ICD-10-CM

## 2011-06-12 DIAGNOSIS — C50919 Malignant neoplasm of unspecified site of unspecified female breast: Secondary | ICD-10-CM

## 2011-06-12 NOTE — Consult Note (Signed)
Annette Beltran, Annette Beltran NO.:  [id number]  MEDICAL RECORD NO.:  [id number]  LOCATION:  1442                         FACILITY:  [facility]  PHYSICIAN:  Annette Beltran, M.D.DATE OF BIRTH:  09-10-1946  DATE OF CONSULTATION: DATE OF DISCHARGE:                                CONSULTATION   I was asked to consult on this 65 year old [place] woman, I formerly followed for a remote history of stage I breast cancer.  Annette Beltran presented this time with acute left upper quadrant pain.  This brought her to the emergency room on August 5th where initial lab work included a normal lipase at 51 and normal liver function tests and normal CMET as well as a CBC, which showed white cell count of 12.6, absolute neutrophil count 9.8, hemoglobin 14.3, MCV 87.7, and platelets 253,000.  A CT of the abdomen and pelvis was obtained to further evaluate the left upper quadrant pain and this showed two wedge-shaped hypoenhancing areas in the spleen consistent with splenic infarct.  There was really nothing else of note in the CTs of the abdomen and pelvis.  Certainly, no evidence of cancer, no evidence of adenopathy, and importantly no evidence of splenomegaly.  The pancreas in particular also appeared unremarkable.  There was some atherosclerotic calcification of the abdominal aorta.  The patient was admitted and further workup included a CT angio of the chest on August 6th.  This showed no evidence of pulmonary embolus.  The atherosclerotic changes in the thoracic aorta were felt to be minimal. There was no aneurysm or dissection.  Again in the chest, there was no adenopathy and no evidence of recurrent breast cancer or any other suggestion of primary tumor.  At this time, I felt the patient should be on a monitored bed under heparin.  This was done and cardiogram was obtained. This showed only bradycardia with occasional atrial escaped beats.  24- hour monitoring here was  unremarkable.  The differential of isolated splenic infarct is broad.  In the absence of splenomegaly, a primary hematologic problem such as myeloproliferative syndrome or myeloid metaplasia, a primary non- Hodgkin's lymphoma, or similar problems would be extremely unlikely. All of them are associated with massive splenomegaly.  The cause of the infarcts certainly could be embolic and the patient had an echocardiogram to evaluate this on August 7th.  This showed an ejection fraction in the 55%-60% range with normal wall motion.  There was minimal left ventricular diastolic dysfunction, mild aortic regurgitation, trivial mitral regurgitation with mildly thickened leaflet, some increased atrial septal thickness, and unremarkable inferior vena cava consistent with normal central venous pressures.  There was nothing to suggest the heart as an embolic source, and given the appearance of the aorta atherosclerotic emboli were also unlikely  Accordingly, we obtained an arteriogram on August 8th to evaluate for arteritis.  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  Additional views showed a short segment narrowing of the celiac artery approximately 70% just distal to its origin from the abdominal aorta. because of this a decision was made not to do subselective injection of the  celiac artery because of concerns regarding dissection or occlusion.  I think at this point we have a clear understanding of why the patient had left upper quadrant pain and namely she had splenic infarcts.  I think we have a better idea of why she got the splenic infarcts, namely there is significant narrowing of the celiac artery and the splenic artery with poststenotic dilatation of the splenic artery likely leading to turbulence and clot formation.  What is less clear at this point is why theseparticular arteries are  stenotic.  We worked the patient up for possible arteritis.  She had an elevated C-reactive protein at 10, but the ESR was only 25.  Hepatitis B surface antigen was negative. Importantly, we do not see evidence of a pan arteritis and the renal arteries in particular were looked at and were unremarkable.  I doubt that this is Takayasu's, though that would be in the differential. ANCA is pending.  A second possibility would be polycythemia vera.  This can cause both arterial and venous clots.  We have a JAK2 pending.  If that were positive, I think we would consider lifelong anticoagulation with warfarin; however, I do not expect that lab test to be positive.  The possibility of intermittent subclinical pancreatitis has to be considered in this patient, who does drink 2 or 3 glasses of wine daily. There is in fact some evidence of inflammation ("fat stranding") in the relevant area by CT scans. It is possible that she may have had mild symptoms of abdominal discomfort and since she has a history of collagenous colitis, she may have thought that these mild pancreatitis symptoms were related to that. At any rate, I have strongly advised the patient to drink no more than 3 to 5 drinks a week.  I think that would certainly take care of that issue.  She does have a history of collagenous colitis made by Dr. Juanda Beltran about 10 years ago.  It is causing the patient some diarrhea.  I think she and Dr. Juanda Beltran might discuss whether budesonide would be helpful to her diarrhea problem (I do not think this was available 10 years ago when she was originally diagnosed).  However, I do not believe the collagenous colitis is likely the reason for her focal, celiac, and splenic arterial narrowing.  At least, I cannot find any reports of that in the literature.  Finally, the patient was worked up for hypercoagulable state.  She was negative for the factor V Leiden mutation, negative for  antiphospholipid syndrome workup and has normal protein C and protein S levels.  We still have a prothrombin gene mutation study pending at this time.  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  A question is how to follow up on this. We could consider observation only, aspirin, aspirin plus clopidrogel or warfarin. On the analogy of treatment of a completed heart infarct I would treat this  with aspirin alone, and she will be initially on 325  mg daily.  I am going to see her again in about 6 weeks.  We will repeat a splenic ultrasound prior to that visit.  If all looks stable, we will probably switch to 81 mg at that time.  By then, the patient also will have had a 30-day cardiac monitor and we will have those results as well as the final results  of her lab work here.  Annette Beltran certainly knows to call for any problems that may develop in the future.     Annette Beltran, M.D.     Ronna Polio  D:  06/11/2011  T:  06/11/2011  Job:  [id number]  cc:   Dr. Doy Mince. Lenord Fellers, M.D. Fax: [phone]  Hedwig Morton. Annette Chance, MD [address] [place] [address]  Thereasa Solo. Little, M.D. Fax: [phone]  Electronically Signed by Ruthann Cancer M.D. on 06/12/2011 08:51:01 AM

## 2011-06-12 NOTE — Telephone Encounter (Signed)
Left a message for patient to call me. 

## 2011-06-12 NOTE — Telephone Encounter (Signed)
Patient returned our call. Scheduled her on 06/22/11 at 10:00 AM. Letter mailed.

## 2011-06-12 NOTE — Consult Note (Signed)
NAMEANAALICIA, REIMANN NO.:  [id number]  MEDICAL RECORD NO.:  [id number]  LOCATION:  1442                         FACILITY:  [facility]  PHYSICIAN:  Lowella Dell, M.D.DATE OF BIRTH:  April 21, 1946  DATE OF CONSULTATION: DATE OF DISCHARGE:                                CONSULTATION   HISTORY:  Annette Beltran is a 65 year old [place] woman that I formally followed for history of breast cancer.  This is detailed below, but in brief, she was released from followup as of September 2007.  The patient was in her usual state of good health until Sunday, August 5.  The day before, for example, was a normal day, she went to the farmer's market, did some cooking, had no fever, rash, weakness, pruritus, bleeding, sweats, or any other complaints.  She goes to the gym 3 times a week and tolerates exercise without difficulty.  Yesterday morning, however, she developed a sudden pain in the left upper quadrant.  This persisted and actually got worse, so by the time, her husband came home, she felt it would be best if she were evaluated in the emergency room.  Evaluation there showed unremarkable vitals and particularly no fever.  The lab work included a normal lipase, normal creatinine, normal liver function tests, normal electrolytes, and a CBC showed a white cell count of 12.6 with an absolute neutrophil of 9.8 and normal lymphocyte count at 1.8, hemoglobin of 14.3 with an MCV of 87.7 and a platelet count of 253,000.  CT scan of the abdomen and pelvis was obtained and shows 2 wedge-shaped hypoenhancing areas consistent with infarcts.  The patient was accordingly admitted for further evaluation.  PAST MEDICAL HISTORY:  Significant for collagenous colitis followed by Dr. Lina Sar and initially diagnosed in 2003, this has not required any treatment or other interventions.  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  Larry,her husband of 10 years is  present today. Annette Beltran has 2 daughters and multiple grandchildren.  She is a [employer].  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  The tumor was grade 1.  There was no lymph node involvement.  She received postlumpectomy radiation, then aromatase inhibitors until September 2007 when she was released from followup.  ALLERGIES:  No known drug allergies.  CURRENT MEDICATIONS:  She is on potassium, hydromorphone, ondansetron, Hydrocodone, APAP, Ambien, and Tylenol.  REVIEW OF SYSTEMS:  There have been no fever, no drenching night sweats, no malaise, no unexplained fatigue or weight loss, and in short, no "B" symptoms.  She denies unusual headaches, seizures, strokes, or fainting spells, or any visual changes.  She has a cough sometimes associated with allergies and that is the only time she feels a little "short of breath."  She does have epistaxis sometimes when she blows her nose, this is minimal.  She denies palpitations, chest pain, or pressure and particularly she never has shortness of breath when exercising vigorously at the gym, which as stated she does 3 times a week.  She does have irregular bowel movements.  This is not new for her with her history of "colitis," but this has not changed in frequency, intensity, or pattern, has been no bleeding per rectum or melena.  There has been no dysuria or hematuria.  No rash or any skin changes.  She has no moles that she is concerned about.  She denies a history of hypertension, diabetes, or thyroid problems.  PHYSICAL EXAMINATION:  GENERAL:  This evening, Annette Beltran appears comfortable (she had 1 mg of hydromorphone approximately 2 hours ago and had had no other pain medicines for approximately 12 hours prior). VITAL SIGNS:  Temperature is 98.3, pulse 63, respiratory rate 18, and blood  pressure 145/70.  Her room air saturation is 94%. HEENT:  Sclerae are not icteric.  Pupils are equal and reactive. Oropharynx is clear.  I do not palpate any peripheral adenopathy. LUNGS:  No crackles or wheezes with good excursion bilaterally. HEART:  An irregular rate.  The rhythm appears to be "bunched" with 3 beats at a time, but not otherwise in an obvious pattern.  I do not hear a murmur. ABDOMEN:  Soft.  No tenderness to palpation.  Bowel sounds are positive. I do not palpate a spleen. EXTREMITIES:  There is no peripheral edema.  There are no skin changes. NEUROLOGIC:  Nonfocal.  LABORATORY DATA:  Labs are as already noted, in addition, the patient had a normal CA 15-3 and CA 27-29 today.  She had a normal LDH. Urinalysis showed rare bacteria, but was otherwise unremarkable with no blood or nitrites.  Stool was negative for occult blood.  IMAGING:  Films as already discussed.  IMPRESSION AND PLAN:  A 65 year old [place] woman with a remote history of stage I breast cancer detailed above, presenting with acute left upper quadrant pain, with CT of the abdomen and pelvis consistent with splenic infarcts.  I reviewed the CT scans with the radiologist and in addition to the infarcts, there was a possible small filling defect in the portal vein, which may represent a clot.  There was, however, no portal hypertension by films, no evidence of liver abnormality, and no adenopathy. Importantly, the spleen was not enlarged.  I do not think we are dealing with a primary splenic lymphoma, or with a myelodysplastic or myeloproliferative syndrome since in these cases, the spleen is almost invariably enlarged.  There is also no evidence of adenopathy and there is no edema or venous engorgement or other signs or symptoms to suggest portal hypertension.  There is also no liver abnormality noted.  Finally, there is no evidence of recurrent breast cancer in these films (also the patient had  a negative mammogram a year ago at [facility]).  I think it is much more likely that we are dealing with an embolic cause of these infarcts, I requested an electrocardiogram this evening, which showed bradycardia with a rate of 53 with atrial escape beats.  I think it would be helpful to have the patient in a monitored bed to assess for arrhythmias. Dr. Janee Morn has already written for transthoracic echocardiogram and it may be that transesophageal echo may also be needed.  He has also written for a CT angiogram of the chest I think that will be useful as well although I do not think we will see evidence of pulmonary embolism.  If nothing else it will reassurance that there is no evidence of a breast cancer recurrence in the chest, as there is not in the abdomen or pelvis.  It may show significant  atherosclerotic lesions, which may be a noncardiac cause of emboli if that is what we are dealing with.  In the interim, I would suggest the patient be heparinized.  I have discussed this with the patient and with Dr. Janee Morn and went ahead and wrote the order.  The patient has seen Dr. Julieanne Manson, in Cardiology and she requests if a cardiology consult is obtained that he be contacted.     Lowella Dell, M.D.     Ronna Polio  D:  06/08/2011  T:  06/09/2011  Job:  [id number]  cc:   Dr. Janee Morn  [facility]. Lenord Fellers, M.D. Fax: [phone]  Thereasa Solo. Little, M.D. Fax: [phone]  Darius Bump, M.D. Fax: [phone]  Electronically Signed by Ruthann Cancer M.D. on 06/12/2011 08:40:44 AM

## 2011-06-14 LAB — CULTURE, BLOOD (ROUTINE X 2)
Culture  Setup Time: 201208060833
Culture: NO GROWTH
Culture: NO GROWTH

## 2011-06-15 ENCOUNTER — Encounter: Payer: Self-pay | Admitting: Internal Medicine

## 2011-06-15 ENCOUNTER — Ambulatory Visit (INDEPENDENT_AMBULATORY_CARE_PROVIDER_SITE_OTHER): Payer: Medicare Other | Admitting: Internal Medicine

## 2011-06-15 VITALS — BP 112/58 | HR 68 | Temp 98.8°F | Ht 64.0 in | Wt 170.0 lb

## 2011-06-15 DIAGNOSIS — D7389 Other diseases of spleen: Secondary | ICD-10-CM

## 2011-06-15 DIAGNOSIS — I774 Celiac artery compression syndrome: Secondary | ICD-10-CM

## 2011-06-15 DIAGNOSIS — F411 Generalized anxiety disorder: Secondary | ICD-10-CM

## 2011-06-15 DIAGNOSIS — F419 Anxiety disorder, unspecified: Secondary | ICD-10-CM

## 2011-06-15 DIAGNOSIS — K52831 Collagenous colitis: Secondary | ICD-10-CM

## 2011-06-15 DIAGNOSIS — Z853 Personal history of malignant neoplasm of breast: Secondary | ICD-10-CM

## 2011-06-15 DIAGNOSIS — D735 Infarction of spleen: Secondary | ICD-10-CM

## 2011-06-15 DIAGNOSIS — K5289 Other specified noninfective gastroenteritis and colitis: Secondary | ICD-10-CM

## 2011-06-15 MED ORDER — SPIRONOLACTONE 100 MG PO TABS: 100.0000 mg | ORAL_TABLET | Freq: Every day | ORAL | Status: DC

## 2011-06-17 ENCOUNTER — Encounter: Payer: Self-pay | Admitting: Internal Medicine

## 2011-06-18 ENCOUNTER — Other Ambulatory Visit (HOSPITAL_COMMUNITY): Payer: Medicare Other

## 2011-06-19 ENCOUNTER — Ambulatory Visit (HOSPITAL_COMMUNITY)
Admission: RE | Admit: 2011-06-19 | Discharge: 2011-06-19 | Disposition: A | Discharge status: Home / Self Care | Payer: Medicare Other | Source: Ambulatory Visit | Attending: Oncology | Admitting: Oncology

## 2011-06-19 DIAGNOSIS — D7389 Other diseases of spleen: Secondary | ICD-10-CM | POA: Insufficient documentation

## 2011-06-19 DIAGNOSIS — D735 Infarction of spleen: Secondary | ICD-10-CM

## 2011-06-21 DIAGNOSIS — K52831 Collagenous colitis: Secondary | ICD-10-CM | POA: Insufficient documentation

## 2011-06-22 ENCOUNTER — Encounter: Payer: Self-pay | Admitting: Internal Medicine

## 2011-06-22 ENCOUNTER — Other Ambulatory Visit (INDEPENDENT_AMBULATORY_CARE_PROVIDER_SITE_OTHER): Payer: Medicare Other

## 2011-06-22 ENCOUNTER — Ambulatory Visit (INDEPENDENT_AMBULATORY_CARE_PROVIDER_SITE_OTHER): Payer: Medicare Other | Admitting: Internal Medicine

## 2011-06-22 VITALS — BP 104/60 | HR 64 | Ht 65.0 in | Wt 174.0 lb

## 2011-06-22 DIAGNOSIS — K52831 Collagenous colitis: Secondary | ICD-10-CM

## 2011-06-22 DIAGNOSIS — R197 Diarrhea, unspecified: Secondary | ICD-10-CM

## 2011-06-22 DIAGNOSIS — K5289 Other specified noninfective gastroenteritis and colitis: Secondary | ICD-10-CM

## 2011-06-22 LAB — SEDIMENTATION RATE: Sed Rate: 28 mm/hr — ABNORMAL HIGH (ref 0–22)

## 2011-06-22 MED ORDER — PREDNISONE 10 MG PO TABS: ORAL_TABLET | ORAL | Status: DC

## 2011-06-22 MED ORDER — PEG-KCL-NACL-NASULF-NA ASC-C 100 G PO SOLR: 1.0000 | Freq: Once | ORAL | Status: DC

## 2011-06-22 NOTE — Patient Instructions (Addendum)
You have been scheduled for a colonoscopy. Please follow written instructions given to you at your visit today.  Please pick up your Moviprep kit at the pharmacy within the next 2-3 days. Your physician has requested that you go to the basement for the following lab work before leaving today: TtG, Sedimentation Rate, Iga We have sent the following medications to your pharmacy for you to pick up at your convenience: Bentyl 10 mg. Take 1 tablet by mouth at night. CC: Dr Sharlet Salina, Dr Darnelle Catalan

## 2011-06-22 NOTE — Progress Notes (Signed)
Annette Beltran 04/05/1946 MRN [id number]     History of Present Illness:  This is a 65 year old white female with a history of collagenous colitis made on a colonoscopy in January 2005. She was having diarrhea and she continues to have diarrheal stools during the day as well as at night. She was recently hospitalized for LUQ abdominal pain attributed to splenic infarcts. No particular cause has been found. She has a history of breast cancer in 2002. She has lactose intolerance. She denies abdominal pain except for the pain attributed to splenic infarcts which has now subsided. Her weight has been stable.   Past Medical History  Diagnosis Date  . Breast cancer   . Macular degeneration   . Fluid retention   . Colitis, collagenous   . Vitamin D deficiency   . Osteopenia   . Celiac artery stenosis   . Splenic infarction   . Arthritis   . History of blood clots   . Anxiety   . Arrhythmia    Past Surgical History  Procedure Date  . Foot surgery 2003    x 3  . Breast lumpectomy     right    reports that she quit smoking about 10 years ago. She has never used smokeless tobacco. She reports that she drinks alcohol. She reports that she does not use illicit drugs. family history is negative for Colon cancer. Allergies  Allergen Reactions  . Codeine Nausea And Vomiting  . Tequin     Severe stomach pain  . Penicillins Nausea Only and Rash        Review of Systems: Denies dysphagia, odynophagia, shortness of breath or chest pain  The remainder of the 10  point ROS is negative except as outlined in H&P   Physical Exam: General appearance  Well developed, in no distress. Eyes- non icteric. HEENT nontraumatic, normocephalic. Mouth no lesions, tongue papillated, no cheilosis. Neck supple without adenopathy, thyroid not enlarged, no carotid bruits, no JVD. Lungs Clear to auscultation bilaterally. Cor normal S1 normal S2, regular rhythm , no murmur,  quiet precordium. Abdomen  mildly obese. Soft. Nontender. Normoactive bowel sounds. No abnormal rushes. No fullness. Rectal: Soft Hemoccult negative stool. Extremities no pedal edema. Skin no lesions. Neurological alert and oriented x 3. Psychological normal mood and affect.  Assessment and Plan:  Problem #1 Collagenous colitis diagnosed 7 years ago. Low-grade diarrhea continues. It seems to bother her at night. She may be interested in treatment. We will repeat a colonoscopy and biopsies to confirm the diagnosis. We discussed the possibility of a short term Entocort taper. In the meantime, we will try Bentyl 10 mg at bedtime to keep her from having bowel movements at night. We are also checking tissue transglutaminase levels and sedimentation rate as well as IgA level.   06/22/2011 Lina Sar

## 2011-06-23 ENCOUNTER — Telehealth: Payer: Self-pay | Admitting: *Deleted

## 2011-06-23 NOTE — Telephone Encounter (Signed)
Left patient a message with results.

## 2011-06-23 NOTE — Telephone Encounter (Signed)
Message copied by Daphine Deutscher on Tue Jun 23, 2011  1:45 PM ------      Message from: Hart Carwin      Created: Tue Jun 23, 2011  1:02 PM       Please call pt with normal sprue test.

## 2011-07-05 NOTE — Consult Note (Signed)
Annette Beltran, Annette Beltran               ACCOUNT NO.:  [id number]  MEDICAL RECORD NO.:  [id number]  LOCATION:  1442                         FACILITY:  [facility]  PHYSICIAN:  Thereasa Solo. Huxley Shurley, M.D. DATE OF BIRTH:  14-May-1946  DATE OF CONSULTATION:  06/10/2011 DATE OF DISCHARGE:                                CONSULTATION   HISTORY OF PRESENT ILLNESS:  Ms. Annette Beltran is a 65 year old female followed by Dr. Lenord Fellers who was admitted on June 07, 2011 with splenic infarcts. We were asked to see her regarding the indications and need for a transesophageal echocardiogram.  The patient had seen Dr. Clarene Duke in the past.  She had a stress test approximately 7 years ago for some dyspnea that was reportedly normal, she has not seen him since.  She has no other cardiac history, there is no history of coronary artery disease or prior heart catheterization.  She denies any history of arrhythmia or tachycardia.  On telemetry, she has had PACs and sinus rhythm, but no documented arrhythmia.  PAST MEDICAL HISTORY:  Remarkable for breast cancer, she was treated with surgery and radiation in 2002.  She had seen Dr. Juanda Chance in the past for history of colitis.  HOME MEDICATIONS: 1. Xanax 0.5 p.r.n. 2. Aldactone 100 mg a day. 3. Ocuvite daily. 4. Aleve p.r.n.  ALLERGIES: 1. PENICILLIN. 2. CODEINE. 3. TEQUIN.  SOCIAL HISTORY:  She is married.  She is a nonsmoker.  She has 2 children.  FAMILY HISTORY:  Unremarkable.  REVIEW OF SYSTEMS:  Essentially unremarkable except for noted above. For complete review of systems, see admission history and physical.  PHYSICAL EXAMINATION:  VITAL SIGNS:  Blood pressure 107/67, pulse 64, temperature 98.4. GENERAL:  She is a well-developed, well-nourished female in no acute distress. HEENT:  Normocephalic.  Extraocular movements are intact.  Sclerae are nonicteric.  Lids and conjunctivae within normal limits. NECK:  Without JVD or bruits. CHEST:  Clear to auscultation  percussion. CARDIAC:  Reveals regular rate and rhythm without murmur, rub, or gallop.  Normal S1 and S2. ABDOMEN:  Nontender, nondistended. EXTREMITIES:  Without edema.  Distal pulses are intact. NEURO:  Grossly intact.  She is awake, alert, oriented, cooperative, moves all extremities without obvious deficit. SKIN:  Cool and dry.  LABORATORY DATA:  White count 9.5, hemoglobin 13.5, hematocrit 37.5, platelets 223.  Sodium 139, potassium 3.8, BUN 11, creatinine 0.6. Hypercoagulable profile per Dr. Darnelle Catalan, who saw her in consult is unremarkable.  IMPRESSION: 1. Embolic splenic infarcts. 2. History of breast cancer, treated with surgery and radiation in     2002. 3. Past history of colitis.  PLAN:  Echocardiogram has been done and was relatively unremarkable with an EF of 55% to 60%.  Her left atrium is mildly enlarged at 36.  She will be seen by Dr. Fredirick Maudlin partner today and further recommendations regarding a transesophageal echo are pending.     Abelino Derrick, P.A.   ______________________________ Thereasa Solo. Shyonna Carlin, M.D.    Lenard Lance  D:  06/10/2011  T:  06/10/2011  Job:  [id number]  cc:   Thereasa Solo. Seena Face, M.D. Fax: [phone]  Luanna Cole. Lenord Fellers, M.D. Fax: [phone]  Electronically Signed by Franky Macho  Estevan Ryder. on 06/10/2011 10:57:52 AM Electronically Signed by Julieanne Manson M.D. on 07/05/2011 02:26:52 PM

## 2011-07-08 NOTE — Discharge Summary (Signed)
Annette Beltran, Annette Beltran               ACCOUNT NO.:  [id number]  MEDICAL RECORD NO.:  [id number]  LOCATION:  1442                         FACILITY:  [facility]  PHYSICIAN:  Annette Beltran       DATE OF BIRTH:  19-Aug-1946  DATE OF ADMISSION:  06/07/2011 DATE OF DISCHARGE:  06/11/2011                              DISCHARGE SUMMARY   PRIMARY CARE PHYSICIAN:  Annette Beltran, M.D.  REASON FOR ADMISSION:  Left upper quadrant abdominal pain.  DISCHARGE DIAGNOSES: 1. Splenic infarction. 2. 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 3. History of stage I breast cancer over 10 years ago, treated with     lumpectomy and radiation. 4. History of colitis.  DISCHARGE MEDICATIONS: 1. Aspirin 325 mg p.o. daily. 2. Aleve 220 mg 1 tablet every 12 hours as needed for pain. 3. Calcium carbonate with vitamin D one tablet p.o. daily. 4. Unknown tablet p.o. b.i.d. 5. Ocuvite 1 tablet p.o. daily. 6. PreserVision 1 tablet p.o. daily. 7. Spironolactone 100 mg p.o. daily. 8. Vitamin B complex 1 tablet p.o. daily. 9. Xanax 0.5 mg 1 tablet daily at bedtime.  RADIOLOGY: 1. Abdominal arteriogram showed a short-segment stenosis of celiac     artery approximately 70%, long segment narrowing of the central     aspect of both common hepatic and splenic arteries with post     stenotic dilatation within the mid aspect of the splenic artery.     The constellation of these findings are nonspecific, however, may     be seen secondary to an isolated vasculitis, the sequel of prior     trauma, dissection, or even result of remote adjacent inflammatory     or infectious disease as pancreatitis. 2. CT angio of the chest on August 6th showed negative for significant     PE.  There is scattered bilateral atelectasis, probable splenic     infarct. 3. CT abdomen on August 5th showed wedge-shape hypoenhancing splenic     lesions suspicious for splenic infarcts.  Finding called to the ED     physician at the time of  interpretation.  BRIEF HISTORY AND EXAMINATION:  Mrs. Annette Beltran is a 65 year old female with a past medical history of remote stage I breast cancer treated with lumpectomy with radiation.  The patient came into the hospital complaining about left upper quadrant abdominal pain.  The onset was very sudden.  The patient woke up the morning as the pain and stated she had never had any pain like this before.  The patient denies any nausea, vomiting, diarrhea.  Denies any relationship of the pain with food. Denies any fever.  The pain is 8/9, constant, progressively worse since she woke up.  Upon initial evaluation in the emergency department, CT abdomen and pelvis with contrast was done.  It showed 2 areas of hypodense lesions, which were consistent with splenic infarcts.  The patient admitted to the hospital for further evaluation.  BRIEF HOSPITAL COURSE: 1. Splenic infarct.  As mentioned above, there are 2 areas of wedge-     shaped hypodense lesions consistent with splenic infarct.  The     patient's hematologist Dr. Darnelle Beltran was consulted as well as hercardiologist  Dr. Clarene Beltran.  The patient worked out for connective     tissue disease for hypercoagulable state as well as RT riders.  Her     protein C, protein S, antiphospholipid antibodies, and factor V     Leiden were all normal or consistent with the current clot     situation.  Hepatitis B surface antigen was negative as well.  The     patient also has CA 15-3, which was negative for breast and cancer     antigen 27.29 was negative as well.  Monospot test was negative. So, at that time, Cardiology was consulted to evaluate for necessity of transesophageal echocardiogram.  But before that, abdominal aortogram was ordered which showed stenosis in the celiac trunk and some stenosis in the hepatic arteries as well, which might likely caused the splenic infarction.  Dr. Darnelle Beltran not recommended a full dose of aspirin at 325 until the patient  sees him and probably at that time he will switch her to low-dose aspirin.  At time of discharge, the patient was complaining about no pain, ambulating, and had no other complaints.  Please note that 2-D echocardiogram showed ejection fraction of about 58% and there were no wall motion abnormalities.  There were no intraventricular lesions identified.  Dr. Clarene Beltran deferred TEE as the aortogram results can explain the splenic infarction. 1. Other health issues.  The patient appears to be relatively healthy.     She is taking supplements and Aleve as needed for pain.  The     patient is taking spironolactone, it was not clear to me with that     for fluid retention or hypotension, but that was continued at the     time of discharge.  The patient to follow up with her primary care     physician as well as the other consultants.  DISCHARGE INSTRUCTIONS: 1. Activity as tolerated. 2. Disposition, home. 3. Diet, heart-healthy diet.     Annette Beltran     ME/MEDQ  D:  06/11/2011  T:  06/11/2011  Job:  [id number]  cc:   Annette Beltran, M.D. Fax: [phone]  Annette Morton. Juanda Chance, Beltran [address] [place] [address]  Annette Beltran, M.D. Fax: [phone]  Annette Beltran, M.D. Fax: [phone]  Electronically Signed by Annette Beltran  on 07/08/2011 02:47:01 PM

## 2011-07-13 ENCOUNTER — Other Ambulatory Visit: Payer: Self-pay | Admitting: Oncology

## 2011-07-13 ENCOUNTER — Encounter (HOSPITAL_BASED_OUTPATIENT_CLINIC_OR_DEPARTMENT_OTHER): Payer: Medicare Other | Admitting: Oncology

## 2011-07-13 DIAGNOSIS — C50419 Malignant neoplasm of upper-outer quadrant of unspecified female breast: Secondary | ICD-10-CM

## 2011-07-13 LAB — COMPREHENSIVE METABOLIC PANEL
Albumin: 4.3 g/dL (ref 3.5–5.2)
BUN: 17 mg/dL (ref 6–23)
CO2: 22 mEq/L (ref 19–32)
Calcium: 9.9 mg/dL (ref 8.4–10.5)
Glucose, Bld: 94 mg/dL (ref 70–99)
Potassium: 4.6 mEq/L (ref 3.5–5.3)
Sodium: 137 mEq/L (ref 135–145)
Total Protein: 6.7 g/dL (ref 6.0–8.3)

## 2011-07-13 LAB — CBC WITH DIFFERENTIAL/PLATELET
Basophils Absolute: 0 10*3/uL (ref 0.0–0.1)
Eosinophils Absolute: 0.3 10*3/uL (ref 0.0–0.5)
HGB: 14.3 g/dL (ref 11.6–15.9)
LYMPH%: 18.1 % (ref 14.0–49.7)
MCV: 89.8 fL (ref 79.5–101.0)
MONO#: 0.6 10*3/uL (ref 0.1–0.9)
NEUT#: 5.5 10*3/uL (ref 1.5–6.5)
Platelets: 244 10*3/uL (ref 145–400)
RBC: 4.45 10*6/uL (ref 3.70–5.45)
RDW: 12 % (ref 11.2–14.5)
WBC: 7.9 10*3/uL (ref 3.9–10.3)

## 2011-07-22 ENCOUNTER — Encounter (HOSPITAL_BASED_OUTPATIENT_CLINIC_OR_DEPARTMENT_OTHER): Payer: Medicare Other | Admitting: Oncology

## 2011-07-22 DIAGNOSIS — Z170 Estrogen receptor positive status [ER+]: Secondary | ICD-10-CM

## 2011-07-22 DIAGNOSIS — Z853 Personal history of malignant neoplasm of breast: Secondary | ICD-10-CM

## 2011-07-22 DIAGNOSIS — D7389 Other diseases of spleen: Secondary | ICD-10-CM

## 2011-07-30 DIAGNOSIS — D735 Infarction of spleen: Secondary | ICD-10-CM | POA: Insufficient documentation

## 2011-07-30 DIAGNOSIS — F419 Anxiety disorder, unspecified: Secondary | ICD-10-CM | POA: Insufficient documentation

## 2011-07-30 DIAGNOSIS — Z853 Personal history of malignant neoplasm of breast: Secondary | ICD-10-CM | POA: Insufficient documentation

## 2011-07-30 NOTE — Progress Notes (Signed)
  Subjective:    Patient ID: Annette Beltran, female    DOB: February 18, 1946, 65 y.o.   MRN: [id number]  HPI for hospital followup of recent severe left upper quadrant pain which turned out to be do to 2 areas of wedged shaped hypodense lesions consistent with splenic infarctions. Patient was extremely frightened about this. She has a remote history of stage I breast cancer treated with lumpectomy and radiation followed by Dr. Darnelle Catalan. History of collagenous colitis treated by Dr. Lina Sar. History of osteopenia and vitamin D deficiency. History of macular degeneration and fluid retention. Had foot surgery in both feet by Dr. due to 2003. Had Pneumovax May 2010 and influenza immunization October 2011. Had Cardiolite study 2006, bone density study October 2011 and shingles vaccine 2008. Patient accompanied by her husband today. She was seen by cardiologist in the hospital is well. No evidence of pulmonary embolism during hospitalization. CT angiogram the chest was negative. Patient was placed on heparin and antibiotics. She had toxic granulation on CBC. It was felt she had a significant narrowing of the celiac artery and splenic artery with post stenotic dilatation of the splenic artery likely leading to turbulence and clot formation. Is less clear why these arteries are stenotic. She had an elevated C. reactive protein FTN the sedimentation rate was only 25. Hepatitis B surface antigen was negative. No evidence of pan arteritis. Patient is placed on aspirin 325 mg daily. Patient was worked up for hypercoagulable state and that workup was negative for factor V Leiden mutation, negative for antiphospholipid syndrome, patient had normal protein C and protein S.    Review of Systems     Objective:   Physical Exam chest clear to auscultation; cardiac exam regular rate and rhythm without ectopy; extremities without edema        Assessment & Plan:  Recent admission for left upper quadrant pain with diagnosis  of to splenic infarcts apparently do to stenosis of celiac and splenic arteries. Patient now on aspirin therapy.  Remote history of breast cancer  Anxiety  History of collagenous colitis  Return in 6 months

## 2011-07-31 ENCOUNTER — Other Ambulatory Visit: Payer: Self-pay

## 2011-07-31 MED ORDER — ALPRAZOLAM 0.5 MG PO TABS: 0.5000 mg | ORAL_TABLET | Freq: Every evening | ORAL | Status: DC | PRN

## 2011-08-14 ENCOUNTER — Encounter: Payer: Self-pay | Admitting: Internal Medicine

## 2011-08-20 ENCOUNTER — Ambulatory Visit (AMBULATORY_SURGERY_CENTER): Payer: Medicare Other | Admitting: Internal Medicine

## 2011-08-20 ENCOUNTER — Encounter: Payer: Self-pay | Admitting: Internal Medicine

## 2011-08-20 VITALS — BP 146/77 | HR 70 | Temp 97.6°F | Resp 20 | Ht 65.0 in | Wt 174.0 lb

## 2011-08-20 DIAGNOSIS — Z1211 Encounter for screening for malignant neoplasm of colon: Secondary | ICD-10-CM

## 2011-08-20 DIAGNOSIS — K5289 Other specified noninfective gastroenteritis and colitis: Secondary | ICD-10-CM

## 2011-08-20 DIAGNOSIS — K52831 Collagenous colitis: Secondary | ICD-10-CM

## 2011-08-20 MED ORDER — SODIUM CHLORIDE 0.9 % IV SOLN: 500.0000 mL | INTRAVENOUS | Status: DC

## 2011-08-21 ENCOUNTER — Telehealth: Payer: Self-pay | Admitting: *Deleted

## 2011-08-21 NOTE — Telephone Encounter (Signed)

## 2011-08-25 ENCOUNTER — Encounter: Payer: Self-pay | Admitting: Internal Medicine

## 2011-08-25 ENCOUNTER — Ambulatory Visit (INDEPENDENT_AMBULATORY_CARE_PROVIDER_SITE_OTHER): Payer: Medicare Other | Admitting: Internal Medicine

## 2011-08-25 DIAGNOSIS — Z23 Encounter for immunization: Secondary | ICD-10-CM

## 2011-08-25 NOTE — Progress Notes (Signed)
  Subjective:    Patient ID: Annette Beltran, female    DOB: 25-Jul-1946, 66 y.o.   MRN: [id number]  HPI influenza immunization administered today. Not seen by M.D.    Review of Systems     Objective:   Physical Exam        Assessment & Plan:

## 2011-08-25 NOTE — Patient Instructions (Signed)
Influenza immunization administered today

## 2011-08-27 ENCOUNTER — Telehealth: Payer: Self-pay | Admitting: *Deleted

## 2011-08-27 NOTE — Telephone Encounter (Signed)
OK , no steroids, please check with [place]  Soon to give [place] a follow up, if we can help.

## 2011-08-27 NOTE — Telephone Encounter (Signed)
Message copied by Daphine Deutscher on Thu Aug 27, 2011 10:13 AM ------      Message from: Hart Carwin      Created: Wed Aug 26, 2011 10:12 PM       I would like to start her on Prednisone low dose 10mg , po qd x 4 weeks, 5 mg po qd x 4 weeks, then 5mg  po qod x 4 weeks. #60, if she is willing to take it. Entecort is too expensive,

## 2011-08-27 NOTE — Telephone Encounter (Signed)
Left a message for patient to call

## 2011-08-27 NOTE — Telephone Encounter (Signed)
Left patient a message with Dr. Regino Schultze answer and for her to call us with follow up on how she is doing soon.

## 2011-08-27 NOTE — Telephone Encounter (Signed)
Spoke with patient and she states Dr. Juanda Chance told her she would not have to go on steroids if she could handle the way things are now. She states she is doing okay and does not want to take the steroids because they hype her up and she cannot sleep.

## 2011-09-29 ENCOUNTER — Other Ambulatory Visit: Payer: Self-pay

## 2011-09-29 MED ORDER — ALPRAZOLAM 0.5 MG PO TABS: 0.5000 mg | ORAL_TABLET | Freq: Every evening | ORAL | Status: DC | PRN

## 2011-12-07 ENCOUNTER — Telehealth: Payer: Self-pay | Admitting: Internal Medicine

## 2011-12-07 NOTE — Telephone Encounter (Signed)
With her medical problems, she needs OV- can see tomorrow.

## 2011-12-08 ENCOUNTER — Ambulatory Visit (INDEPENDENT_AMBULATORY_CARE_PROVIDER_SITE_OTHER): Payer: Medicare Other | Admitting: Internal Medicine

## 2011-12-08 ENCOUNTER — Encounter: Payer: Self-pay | Admitting: Internal Medicine

## 2011-12-08 VITALS — BP 130/74 | HR 68 | Temp 97.3°F | Wt 175.0 lb

## 2011-12-08 DIAGNOSIS — J329 Chronic sinusitis, unspecified: Secondary | ICD-10-CM

## 2011-12-08 DIAGNOSIS — H6503 Acute serous otitis media, bilateral: Secondary | ICD-10-CM

## 2011-12-08 DIAGNOSIS — H6500 Acute serous otitis media, unspecified ear: Secondary | ICD-10-CM

## 2011-12-08 NOTE — Progress Notes (Signed)
  Subjective:    Patient ID: Annette Beltran, female    DOB: 05/12/1946, 66 y.o.   MRN: [id number]  HPI 2 week history of URI symptoms. Yesterday, had maxillary sinus pressure and right periorbital pain. No fever. No significant cough. Voice has been hoarse. Feels better today.  History of splenic infarcts. Now taking aspirin 81 mg daily.    Review of Systems     Objective:   Physical Exam  HEENT exam: Both TMs are full bilaterally the right more so than the left. TMs are not red. Pharynx is slightly injected. Neck is supple without adenopathy. Chest is clear to auscultation. Cardiac exam regular rate and rhythm        Assessment & Plan:  Sinusitis  Bilateral serous otitis media  Plan: Zithromax Z-Pak 2 tablets by mouth day one followed by 1 tablet by mouth days 2 through 5 with 1 refill. Prescription faxed to [facility]

## 2011-12-08 NOTE — Patient Instructions (Signed)
Take Zithromax Z-PAK as directed. You have one refill on this prescription. Call if not better in 2 weeks or sooner if worse.

## 2011-12-24 ENCOUNTER — Other Ambulatory Visit: Payer: Medicare Other | Admitting: Internal Medicine

## 2011-12-24 DIAGNOSIS — E7800 Pure hypercholesterolemia, unspecified: Secondary | ICD-10-CM

## 2011-12-24 DIAGNOSIS — J309 Allergic rhinitis, unspecified: Secondary | ICD-10-CM

## 2011-12-24 DIAGNOSIS — Z853 Personal history of malignant neoplasm of breast: Secondary | ICD-10-CM

## 2011-12-24 DIAGNOSIS — E559 Vitamin D deficiency, unspecified: Secondary | ICD-10-CM

## 2011-12-24 DIAGNOSIS — F419 Anxiety disorder, unspecified: Secondary | ICD-10-CM

## 2011-12-25 ENCOUNTER — Ambulatory Visit (INDEPENDENT_AMBULATORY_CARE_PROVIDER_SITE_OTHER): Payer: Medicare Other | Admitting: Internal Medicine

## 2011-12-25 ENCOUNTER — Encounter: Payer: Self-pay | Admitting: Internal Medicine

## 2011-12-25 DIAGNOSIS — Z23 Encounter for immunization: Secondary | ICD-10-CM

## 2011-12-25 DIAGNOSIS — M8580 Other specified disorders of bone density and structure, unspecified site: Secondary | ICD-10-CM

## 2011-12-25 DIAGNOSIS — Z0000 Encounter for general adult medical examination without abnormal findings: Secondary | ICD-10-CM

## 2011-12-25 LAB — CBC WITH DIFFERENTIAL/PLATELET
Basophils Relative: 1 % (ref 0–1)
Eosinophils Absolute: 0.4 10*3/uL (ref 0.0–0.7)
Eosinophils Relative: 4 % (ref 0–5)
HCT: 43.1 % (ref 36.0–46.0)
Hemoglobin: 14.9 g/dL (ref 12.0–15.0)
MCH: 31.6 pg (ref 26.0–34.0)
MCHC: 34.6 g/dL (ref 30.0–36.0)
MCV: 91.3 fL (ref 78.0–100.0)
Monocytes Absolute: 0.6 10*3/uL (ref 0.1–1.0)
Monocytes Relative: 7 % (ref 3–12)
RDW: 13.2 % (ref 11.5–15.5)

## 2011-12-25 LAB — POCT URINALYSIS DIPSTICK
Blood, UA: NEGATIVE
Ketones, UA: NEGATIVE
Protein, UA: NEGATIVE
Spec Grav, UA: 1.025
Urobilinogen, UA: NEGATIVE

## 2011-12-25 LAB — COMPREHENSIVE METABOLIC PANEL
Albumin: 4.3 g/dL (ref 3.5–5.2)
Alkaline Phosphatase: 53 U/L (ref 39–117)
BUN: 12 mg/dL (ref 6–23)
Creat: 0.79 mg/dL (ref 0.50–1.10)
Glucose, Bld: 91 mg/dL (ref 70–99)
Potassium: 4.7 mEq/L (ref 3.5–5.3)
Total Bilirubin: 0.8 mg/dL (ref 0.3–1.2)

## 2011-12-25 LAB — LIPID PANEL
HDL: 62 mg/dL (ref >39)
LDL Cholesterol: 107 mg/dL — ABNORMAL HIGH (ref 0–99)
Total CHOL/HDL Ratio: 3.2 Ratio
Triglycerides: 153 mg/dL — ABNORMAL HIGH (ref <150)

## 2011-12-27 ENCOUNTER — Encounter: Payer: Self-pay | Admitting: Internal Medicine

## 2012-01-03 DIAGNOSIS — M8580 Other specified disorders of bone density and structure, unspecified site: Secondary | ICD-10-CM | POA: Insufficient documentation

## 2012-01-03 NOTE — Progress Notes (Signed)
Subjective:    Patient ID: Annette Beltran, female    DOB: [birth year], 66 y.o.   MRN: [id number]  HPI 66 year old white female recently hospitalized with abdominal pain and found to have 2 splenic infarctions. Was seen by Dr. Darnelle Catalan. Hypercoagulable workup was negative. Patient was found to have on abdominal aortogram stenosis in the celiac trunk (celiac artery stenosis of 70%) and some stenosis in the hepatic arteries as well which may have caused a splenic infarction. Dr. Darnelle Catalan recommended full dose aspirin 325 mg daily. She's done well since that time. 2-D echocardiogram showed ejection fraction of 58% with no wall motion abnormalities.  History of stage I breast cancer treated with lumpectomy and radiation. History of colitis followed by Dr. Juanda Chance. History of anxiety treated with Xanax. Patient had colonoscopy March 09/22/2003. Had negative Cardiolite study in 2006 after abnormal Bruce protocol exercise stress test.  Patient was diagnosed with breast cancer in 2002. Became menopausal at age 54. In 2002 was smoking one pack per day. Social alcohol consumption. Has taken aspirin a lactone for leg swelling for many years. Had been unable to get her to quit that. Breast cancer was on the right. Sentinel no dissection was negative. Tumor was ER/PR positive,HER-2 neu negative. 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 She was treated with radiation and then was on Arava Demadex from October 2002 and 09/13/2002 then Femara until June 2004, then on Evista and subsequently Aromasin. Antiestrogen treatment was stopped in 2007.  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 Also had eustachian tube dysfunction.  History of fractured fifth metatarsal October 2005 during a trip to [place]. Patient had bone density study October 2011 showing T score in femoral neck -2.2 and spine of -0.3. This is consistent with osteopenia and currently has been treated in the past with  Miacalcin nasal spray. Now just with calcium supplementation and vitamin D.    Review of Systems  Constitutional: Negative.   HENT: Positive for rhinorrhea.   Eyes:       History of macular degeneration  Respiratory: Negative.   Cardiovascular: Negative.   Gastrointestinal:       History of collagenous colitis currently not on any treatment  Genitourinary: Negative.   Musculoskeletal: Negative.   Neurological: Negative.   Hematological: Negative.   Psychiatric/Behavioral:       Anxiety       Objective:   Physical Exam  Nursing note and vitals reviewed. Constitutional: She is oriented to person, place, and time. She appears well-developed and well-nourished. No distress.  HENT:  Head: Normocephalic and atraumatic.  Right Ear: External ear normal.  Left Ear: External ear normal.  Mouth/Throat: Oropharynx is clear and moist.  Eyes: Conjunctivae and EOM are normal. Pupils are equal, round, and reactive to light. Right eye exhibits no discharge. Left eye exhibits no discharge. No scleral icterus.  Neck: Neck supple. No JVD present. No thyromegaly present.  Cardiovascular: Normal rate, normal heart sounds and intact distal pulses.   No murmur heard. Pulmonary/Chest: Effort normal and breath sounds normal. She has no wheezes. She has no rales.  Abdominal: Soft. Bowel sounds are normal. She exhibits no distension and no mass. There is no tenderness. There is no rebound and no guarding.       No splenomegaly  Musculoskeletal: She exhibits no edema.  Lymphadenopathy:    She has no cervical adenopathy.  Neurological: She is alert and oriented to person, place, and time. She has normal reflexes. No  cranial nerve deficit. Coordination normal.  Skin: Skin is warm and dry.  Psychiatric: She has a normal mood and affect. Her behavior is normal.          Assessment & Plan:  2 splenic infarctions from the celiac artery stenosis August 2012 now treated with aspirin  History of  breast cancer 2002 now off antiestrogen therapy since 2007  Anxiety  Dependent edema treated with broad a lactone  Macular degeneration  History of collagenous colitis followed by Dr. Juanda Chance  History of vitamin D deficiency  Osteopenia  Allergic rhinitis  Plan: Patient is to return in one year or as needed. Okay to refill Xanax  for anxiety.

## 2012-01-03 NOTE — Patient Instructions (Signed)
Continue same medications. Return one year or as needed 

## 2012-02-04 ENCOUNTER — Ambulatory Visit (INDEPENDENT_AMBULATORY_CARE_PROVIDER_SITE_OTHER): Payer: Medicare Other | Admitting: Internal Medicine

## 2012-02-04 ENCOUNTER — Encounter: Payer: Self-pay | Admitting: Internal Medicine

## 2012-02-04 VITALS — BP 136/86 | HR 64 | Temp 98.9°F | Wt 176.0 lb

## 2012-02-04 DIAGNOSIS — F4321 Adjustment disorder with depressed mood: Secondary | ICD-10-CM

## 2012-02-04 DIAGNOSIS — J329 Chronic sinusitis, unspecified: Secondary | ICD-10-CM

## 2012-02-05 ENCOUNTER — Encounter: Payer: Self-pay | Admitting: Internal Medicine

## 2012-02-05 DIAGNOSIS — F4321 Adjustment disorder with depressed mood: Secondary | ICD-10-CM | POA: Insufficient documentation

## 2012-02-05 NOTE — Progress Notes (Signed)
  Subjective:    Patient ID: Annette Beltran, female    DOB: 1946-02-04, 66 y.o.   MRN: [id number]  HPI Patient's mother died recently in [place] of complications of a cerebral hemorrhage. Family made decision to take her off life support. Patient says mother had a living will. Despite this, patient is having significant grief reaction. Not sleeping well. In the meantime, has developed upper respiratory infection symptoms and sinusitis. Sounds nasally congested. Has maxillary sinus pressure. No fever or shaking chills. Has malaise and fatigue.    Review of Systems     Objective:   Physical Exam HEENT exam: TMs are normal, pharynx clear, sounds nasally congested, neck supple, chest clear. Spent 30 minutes speaking with patient about grief reaction.        Assessment & Plan:  Sinusitis  Grief reaction  Plan: Takes Xanax sparingly as needed for anxiety with grief reaction. Zithromax Z-PAK take 2 tablets by mouth day one followed by 1 tablet by mouth days 2 through 5.

## 2012-02-05 NOTE — Patient Instructions (Signed)
Take Zithromax Z-PAK as prescribed. Use Xanax as needed for grief reaction. Return as needed.

## 2012-02-08 ENCOUNTER — Telehealth: Payer: Self-pay | Admitting: Internal Medicine

## 2012-02-08 NOTE — Telephone Encounter (Signed)
error 

## 2012-03-04 ENCOUNTER — Telehealth: Payer: Self-pay | Admitting: Internal Medicine

## 2012-03-04 DIAGNOSIS — J069 Acute upper respiratory infection, unspecified: Secondary | ICD-10-CM

## 2012-03-04 NOTE — Telephone Encounter (Signed)
Call in Zithromax Z Pak 2 tabs day 1 then 1 tab days 2-5. See in one week if no better.  It has been nearly 30 days since last seen.  MJB

## 2012-03-04 NOTE — Telephone Encounter (Signed)
Called in RX for Z-Pack to [facility] per MJB orders.  Adv pt of this info and adv pt to be seen if not feeling better in 1 week.  Pt verbalizes understanding.

## 2012-04-01 ENCOUNTER — Encounter: Payer: Self-pay | Admitting: Internal Medicine

## 2012-04-01 ENCOUNTER — Ambulatory Visit (INDEPENDENT_AMBULATORY_CARE_PROVIDER_SITE_OTHER): Payer: BLUE CROSS/BLUE SHIELD | Admitting: Internal Medicine

## 2012-04-01 VITALS — BP 126/74 | HR 68 | Temp 98.9°F | Wt 176.0 lb

## 2012-04-01 DIAGNOSIS — H6590 Unspecified nonsuppurative otitis media, unspecified ear: Secondary | ICD-10-CM | POA: Insufficient documentation

## 2012-04-01 NOTE — Patient Instructions (Signed)
Take Zithromax Z-Pak 2 tablets day one followed by 1 tablet by mouth days 2 through 5 with 1 refill given. Take Xanax 0.5 mg twice daily as needed for anxiety. Consider allergy testing if symptoms recur.

## 2012-04-01 NOTE — Progress Notes (Signed)
  Subjective:    Patient ID: Omari Koslosky, female    DOB: 03-29-1946, 66 y.o.   MRN: [id number]  HPI 66 year old white female with history of recurrent bouts of otitis media and anxiety. Has had several bouts this spring with otitis media. Says left ear is hurting today. Says she has never been allergy tested. May want to consider that in the near future. Some sinus drainage. Has not been using saline in her nostrils as much as she did last year.    Review of Systems     Objective:   Physical Exam both TMs are full but the right one is much fuller than the left. They are not red. Pharynx is clear. Neck is supple. Chest clear.        Assessment & Plan:  Bilateral serous otitis media  Possible allergic rhinitis  Anxiety  Plan: At her request refill Xanax 0.5 mg #60 one by mouth twice daily with 3 refills. Zithromax Z-PAK take 2 tablets by mouth day one followed by 1 tablet by mouth days 2 through 5 with 1 refill.

## 2012-04-25 ENCOUNTER — Encounter: Payer: Self-pay | Admitting: Internal Medicine

## 2012-04-25 ENCOUNTER — Ambulatory Visit (INDEPENDENT_AMBULATORY_CARE_PROVIDER_SITE_OTHER): Payer: BLUE CROSS/BLUE SHIELD | Admitting: Internal Medicine

## 2012-04-25 VITALS — BP 128/78 | HR 68 | Temp 98.6°F | Ht 64.5 in | Wt 175.5 lb

## 2012-04-25 DIAGNOSIS — F341 Dysthymic disorder: Secondary | ICD-10-CM

## 2012-04-25 DIAGNOSIS — G4700 Insomnia, unspecified: Secondary | ICD-10-CM

## 2012-04-25 DIAGNOSIS — B029 Zoster without complications: Secondary | ICD-10-CM

## 2012-04-25 DIAGNOSIS — F329 Major depressive disorder, single episode, unspecified: Secondary | ICD-10-CM

## 2012-04-30 NOTE — Patient Instructions (Addendum)
Start Lexapro after finishing course of Valtrex. Take 10 mg of Lexapro daily. Take Xanax as needed for anxiety. Take Vicodin as needed for right leg pain. Return in 4-6 weeks for reevaluation of depression.

## 2012-04-30 NOTE — Progress Notes (Signed)
  Subjective:    Patient ID: Annette Beltran, female    DOB: 15-Jun-1946, 66 y.o.   MRN: [id number]  HPI 66 year old white female who said she developed severe pain popliteal fossa right leg on Friday, June 21. She could get any sleep and didn't know what was causing the pain. Had not had any knee injury or overexertion. Within the next 48 hours a rash developed in the popliteal area of her right leg. It's been irritated feeling. No prior history of shingles. This made her quite anxious. She did not seek emergency attention over the weekend. Still having pain in the right knee shooting down right lower extremity. Did have chickenpox as a child.    Her friends think she is depressed she says. She still grieving the loss of her mother. Her daughter recently separated from her husband and she's worried about her grandson. Multiple stresses and patient has history of anxiety and insomnia. Spent 20 minutes talking with patient about these issues.  Review of Systems     Objective:   Physical Exam has a vesicular rash with macular erythema popliteal fossa right lower extremity. No evidence of secondary infection. She is alert and oriented x3. Affect and judgment are appropriate but she seems a bit down. With regard to right leg. Muscle strength is normal. Deep tendon reflexes are 2+ and symmetrical. Chest is clear. Cardiac exam regular rate and rhythm.        Assessment & Plan:  Depression  Anxiety herpes zoster right popliteal fossa  Herpes zoster right popliteal fossa  Anxiety  Depression  Insomnia  Plan: Patient does not want to go for counseling at this point in time. Start Lexapro 10 mg daily. Has Xanax for anxiety. Valtrex 1 g 3 times a day for 7 days. Call if not better in 48-72 hours with regard to shingles outbreak. Vicodin 5/500 one by mouth every 6 hours when necessary pain (#40).

## 2012-05-16 ENCOUNTER — Other Ambulatory Visit: Payer: Self-pay

## 2012-05-16 MED ORDER — SPIRONOLACTONE 100 MG PO TABS: 100.0000 mg | ORAL_TABLET | Freq: Every day | ORAL | Status: DC

## 2012-05-26 ENCOUNTER — Telehealth: Payer: Self-pay | Admitting: Internal Medicine

## 2012-05-26 NOTE — Telephone Encounter (Signed)
Pt called in; states to please advise Annette Beltran that she doesn't want to keep appt on 7/26 @ 12:00.  Feels she doesn't want to try any other medication at this time for depression.  States if she decides she needs something, she'll call us back and make an appointment.   Dr. Lenord Fellers was made aware of this info and appointment cancelled.

## 2012-05-27 ENCOUNTER — Ambulatory Visit (INDEPENDENT_AMBULATORY_CARE_PROVIDER_SITE_OTHER): Payer: BLUE CROSS/BLUE SHIELD | Admitting: Internal Medicine

## 2012-05-27 ENCOUNTER — Encounter: Payer: Self-pay | Admitting: Internal Medicine

## 2012-05-27 ENCOUNTER — Ambulatory Visit: Payer: BLUE CROSS/BLUE SHIELD | Admitting: Internal Medicine

## 2012-05-27 ENCOUNTER — Ambulatory Visit (HOSPITAL_COMMUNITY)
Admission: RE | Admit: 2012-05-27 | Discharge: 2012-05-27 | Disposition: A | Discharge status: Home / Self Care | Payer: Medicare Other | Source: Ambulatory Visit | Attending: Internal Medicine | Admitting: Internal Medicine

## 2012-05-27 VITALS — BP 126/72 | HR 76 | Temp 98.5°F | Ht 64.5 in | Wt 175.0 lb

## 2012-05-27 DIAGNOSIS — Z8619 Personal history of other infectious and parasitic diseases: Secondary | ICD-10-CM

## 2012-05-27 DIAGNOSIS — R52 Pain, unspecified: Secondary | ICD-10-CM

## 2012-05-27 DIAGNOSIS — M79606 Pain in leg, unspecified: Secondary | ICD-10-CM

## 2012-05-27 DIAGNOSIS — F411 Generalized anxiety disorder: Secondary | ICD-10-CM

## 2012-05-27 DIAGNOSIS — M79609 Pain in unspecified limb: Secondary | ICD-10-CM

## 2012-05-27 DIAGNOSIS — F419 Anxiety disorder, unspecified: Secondary | ICD-10-CM

## 2012-05-27 DIAGNOSIS — M25561 Pain in right knee: Secondary | ICD-10-CM

## 2012-05-27 DIAGNOSIS — F329 Major depressive disorder, single episode, unspecified: Secondary | ICD-10-CM

## 2012-05-27 DIAGNOSIS — M25569 Pain in unspecified knee: Secondary | ICD-10-CM

## 2012-05-27 NOTE — Progress Notes (Signed)
  Subjective:    Patient ID: Annette Beltran, female    DOB: Mar 28, 1946, 66 y.o.   MRN: [id number]  HPI C/o right knee pain and swelling onset last night. Did not sleep because of pain. Hx Herpes zoster back of knee popliteal area late June. She says it hurts to bend her knee and she's noted some slight swelling about the knee. Was doing a lot of work yesterday around the house and helping husband move things to the addict but says she was not climbing stairs that much. Recently said she thought she was depressed after the death of her mother and some other family situations. Tried initially on low-dose antidepressant which she only took for a few days and said she could not tolerate. Does not want to take another antidepressant at this point in time.    Review of Systems     Objective:   Physical Exam complaining bitterly of knee pain. Right knee is slightly swollen without significant effusion but there were prominent superficial varicosities over right knee. Calf is nontender. Decreased range of motion right knee secondary to pain. Tender in right medial knee joint and along the right medial collateral ligament. Homans sign is negative. She is very anxious. 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 She is alert and oriented x3. No lower extremity edema.        Assessment & Plan:  Right knee pain   Hx Herpes zoster posterior leg   R/o DVT  Depression-patient did not tolerate antidepressant and does not want to try another one at this point in time  Anxiety   Addendum:  Doppler of RLE negative for DVT  Plan: Patient referred to orthopedist. Appointment for Wednesday, July 31. He is to ice knee down for 20 minutes twice daily. Offered Sterapred DS 10 mg 6 day dosepak but she declined. Gave her samples of Celebrex 200 mg daily.

## 2012-05-27 NOTE — Progress Notes (Signed)
VASCULAR LAB PRELIMINARY  PRELIMINARY  PRELIMINARY  PRELIMINARY  Right lower extremity venous duplex completed.    Preliminary report:  Right:  No evidence of DVT, superficial thrombosis, or Baker's cyst.  Paloma Grange, RVS 05/27/2012, 4:03 PM

## 2012-05-28 ENCOUNTER — Encounter: Payer: Self-pay | Admitting: Internal Medicine

## 2012-05-28 NOTE — Patient Instructions (Addendum)
Appointment made to see orthopedist Wednesday, July 31. Takes Celebrex 200 mg daily (samples provided). Ice knee down for 20 minutes twice daily. Avoid heavy activity until seen orthopedist.

## 2012-07-01 ENCOUNTER — Telehealth: Payer: Self-pay

## 2012-07-01 NOTE — Telephone Encounter (Signed)
Patient calls today stating her shingles has returned behind her left  Knee. Has a rash and pain in lower leg and shin. Per Dr. Lenord Fellers, refer to derm. Is established at [facility], so will be seen by Dr. Nicholas Lose today at 3:00 pm. She is informed of this appointment.

## 2012-07-05 ENCOUNTER — Telehealth: Payer: Self-pay

## 2012-07-05 NOTE — Telephone Encounter (Signed)
Wanted you to know she saw Dr. Nicholas Lose on Friday, and was diagnosed with tinea versicolor. Per Dr. Nicholas Lose, if the 1st shingles diagnosis was certain, then she may be experiencing post-herpetic  Neuralgia, and would probably benefit from some Gabapentin.

## 2012-07-12 ENCOUNTER — Encounter: Payer: Self-pay | Admitting: Internal Medicine

## 2012-07-12 ENCOUNTER — Ambulatory Visit (INDEPENDENT_AMBULATORY_CARE_PROVIDER_SITE_OTHER): Payer: Medicare Other | Admitting: Internal Medicine

## 2012-07-12 VITALS — BP 126/74 | HR 80 | Temp 99.2°F

## 2012-07-12 DIAGNOSIS — M25569 Pain in unspecified knee: Secondary | ICD-10-CM

## 2012-07-14 ENCOUNTER — Other Ambulatory Visit: Payer: Medicare Other | Admitting: Internal Medicine

## 2012-07-14 DIAGNOSIS — Z0189 Encounter for other specified special examinations: Secondary | ICD-10-CM

## 2012-07-14 LAB — CREATININE, SERUM: Creat: 0.8 mg/dL (ref 0.50–1.10)

## 2012-07-14 LAB — BUN: BUN: 15 mg/dL (ref 6–23)

## 2012-07-16 ENCOUNTER — Other Ambulatory Visit: Payer: Medicare Other

## 2012-07-20 ENCOUNTER — Other Ambulatory Visit: Payer: Self-pay | Admitting: Internal Medicine

## 2012-07-20 ENCOUNTER — Ambulatory Visit
Admission: RE | Admit: 2012-07-20 | Discharge: 2012-07-20 | Disposition: A | Discharge status: Home / Self Care | Payer: Medicare Other | Source: Ambulatory Visit | Attending: Internal Medicine | Admitting: Internal Medicine

## 2012-07-20 ENCOUNTER — Telehealth: Payer: Self-pay

## 2012-07-20 DIAGNOSIS — M25569 Pain in unspecified knee: Secondary | ICD-10-CM

## 2012-07-21 NOTE — Telephone Encounter (Signed)
Patient informed of MRI knee results. Per Dr. Lenord Fellers, needs to see an orthopedist, of her choice.

## 2012-07-22 ENCOUNTER — Telehealth: Payer: Self-pay

## 2012-07-22 NOTE — Telephone Encounter (Signed)
Appointment scheduled for patient to see Dr. Cleophas Dunker, her choice, on 08/12/2012. Informed of this

## 2012-07-30 NOTE — Progress Notes (Signed)
  Subjective:    Patient ID: Annette Beltran, female    DOB: 10-May-1946, 66 y.o.   MRN: [id number]  HPI Patient continued to have intermittent pain right popliteal area. She thought she may have had a bout of shingles  originally but I am not convinced of that. Now she thinks she might have postherpetic neuralgia. She did see orthopedist recently who did some plain x-rays and told her everything looked okay but she still has recurrent, intermittent pain which is rather severe. Difficulty ambulating because of pain. She saw dermatologist recently who told her she had tinea versicolor.    Review of Systems     Objective:   Physical Exam patient has good range of motion in the joint. No evidence of popliteal cyst. Does appear to have dermatitis consistent with tinea versicolor popliteal area        Assessment & Plan:  Popliteal pain-rule out internal the range right knee  Tinea versicolor-treated by dermatologist  Plan: Patient is to have MRI of knee tomorrow.  Addendum: Patient has large tear right lateral meniscus with parameniscal cyst which extends deep and caudally. Refer to orthopedist of her choice.

## 2012-07-30 NOTE — Patient Instructions (Addendum)
We will arrange for MRI of your knee to rule out internal derangement. If internal derangement is discovered, you  will need to see orthopedist

## 2012-08-10 ENCOUNTER — Ambulatory Visit (INDEPENDENT_AMBULATORY_CARE_PROVIDER_SITE_OTHER): Payer: BLUE CROSS/BLUE SHIELD | Admitting: Internal Medicine

## 2012-08-10 DIAGNOSIS — Z23 Encounter for immunization: Secondary | ICD-10-CM

## 2012-10-07 ENCOUNTER — Encounter: Payer: Self-pay | Admitting: Internal Medicine

## 2012-10-07 ENCOUNTER — Ambulatory Visit (INDEPENDENT_AMBULATORY_CARE_PROVIDER_SITE_OTHER): Payer: Medicare Other | Admitting: Internal Medicine

## 2012-10-07 VITALS — BP 128/74 | HR 72 | Temp 98.9°F

## 2012-10-07 DIAGNOSIS — J069 Acute upper respiratory infection, unspecified: Secondary | ICD-10-CM

## 2012-10-07 DIAGNOSIS — H6593 Unspecified nonsuppurative otitis media, bilateral: Secondary | ICD-10-CM

## 2012-10-07 DIAGNOSIS — H6590 Unspecified nonsuppurative otitis media, unspecified ear: Secondary | ICD-10-CM

## 2012-10-07 MED ORDER — AZITHROMYCIN 250 MG PO TABS: ORAL_TABLET | ORAL | Status: DC

## 2012-10-07 NOTE — Progress Notes (Signed)
  Subjective:    Patient ID: Annette Beltran, female    DOB: [birth year], 66 y.o.   MRN: [id number]  HPI   Patient recently had right arthroscopic knee surgery by Dr. Cleophas Dunker  and is recovering well from that. Hasn't been able to return to exercise program yet. Has developed left ear pain. Has some nasal congestion. No sore throat. No fever or shaking chills. History of recurrent bouts of serous otitis media. History of anxiety.    Review of Systems     Objective:   Physical Exam Left TM:   is slightly full but not red. Right TM: is full and dull but not red. Neck: Supple; Pharynx is only slightly injected without exudate. Chest is clear.        Assessment & Plan:   Bilateral serous otitis media  URI  Plan: Zithromax Z-Pak-take 2 tablets day one followed by 1 tablet days 2 through 5 with 1 refill. If not better in one week have prescription refill. Prescription called into [facility] at patient request

## 2012-10-07 NOTE — Patient Instructions (Addendum)
Take Zithromax Z-PAK as directed. If not better in one week have prescription refilled

## 2012-10-10 MED ORDER — SPIRONOLACTONE 100 MG PO TABS: 100.0000 mg | ORAL_TABLET | Freq: Every day | ORAL | Status: DC

## 2012-10-10 NOTE — Addendum Note (Signed)
Addended by: Judy Pimple on: 10/10/2012 11:01 AM   Modules accepted: Orders

## 2012-10-21 ENCOUNTER — Other Ambulatory Visit: Payer: Self-pay

## 2012-10-21 MED ORDER — ALPRAZOLAM 0.5 MG PO TABS: 0.5000 mg | ORAL_TABLET | Freq: Every evening | ORAL | Status: DC | PRN

## 2012-10-24 ENCOUNTER — Encounter: Payer: Self-pay | Admitting: Internal Medicine

## 2012-10-24 ENCOUNTER — Ambulatory Visit (INDEPENDENT_AMBULATORY_CARE_PROVIDER_SITE_OTHER): Payer: Medicare Other | Admitting: Internal Medicine

## 2012-10-24 VITALS — BP 146/72 | HR 72 | Temp 98.4°F

## 2012-10-24 DIAGNOSIS — L304 Erythema intertrigo: Secondary | ICD-10-CM

## 2012-10-24 DIAGNOSIS — B349 Viral infection, unspecified: Secondary | ICD-10-CM

## 2012-10-24 DIAGNOSIS — F4321 Adjustment disorder with depressed mood: Secondary | ICD-10-CM

## 2012-10-24 DIAGNOSIS — H6590 Unspecified nonsuppurative otitis media, unspecified ear: Secondary | ICD-10-CM

## 2012-10-24 DIAGNOSIS — L538 Other specified erythematous conditions: Secondary | ICD-10-CM

## 2012-10-24 DIAGNOSIS — B9789 Other viral agents as the cause of diseases classified elsewhere: Secondary | ICD-10-CM

## 2012-10-24 DIAGNOSIS — F419 Anxiety disorder, unspecified: Secondary | ICD-10-CM

## 2012-10-24 DIAGNOSIS — F411 Generalized anxiety disorder: Secondary | ICD-10-CM

## 2012-10-24 MED ORDER — METHYLPREDNISOLONE ACETATE 80 MG/ML IJ SUSP
80.0000 mg | Freq: Once | INTRAMUSCULAR | Status: AC
  Administered 2012-10-24: 80 mg via INTRAMUSCULAR

## 2012-10-24 NOTE — Patient Instructions (Addendum)
Takes Xanax as needed for anxiety. You have been given injection of Depo-Medrol IM today for serous otitis media. Please try to rest and take care of herself.

## 2012-10-24 NOTE — Progress Notes (Signed)
  Subjective:    Patient ID: Annette Beltran, female    DOB: 09/17/1946, 66 y.o.   MRN: [id number]  HPI Patient in today complaining of swollen glands in her neck. She has had 2 rounds of Zithromax. Is under a lot of stress with daughter who is going through a divorce. Also having grief reaction having lost her mother this year. Says she's tired and worn out. Spent 20 minutes talking with patient about her symptoms and her situation. She also has noticed a fine rash right side of her neck. She's wondering if it's a drug reaction to Zithromax.    Review of Systems     Objective:   Physical Exam erythematous macular rash right neck which is very limited and seems to be discrete in the folds of her skin. Anterior cervical nodes bilaterally. TMs are full bilaterally but not red. Pharynx slightly injected. Neck is supple. Chest is clear. Cardiac exam is normal.        Assessment & Plan:  Viral syndrome  Persistent bilateral serous otitis media  Fatigue  Grief reaction  Intertrigo right neck  Plan: Depo-Medrol 80 mg IM for persistent serous otitis media. Patient is to rest, drink plenty of fluids, take Xanax as needed for anxiety. Xanax was refilled to [facility]  today . For intertrigo, Lotrisone cream 30 g to use twice daily to right neck.  Spent 25 minutes talking with patient about her situation.

## 2012-10-24 NOTE — Addendum Note (Signed)
Addended by: Judy Pimple on: 10/24/2012 04:19 PM   Modules accepted: Orders

## 2012-11-17 ENCOUNTER — Encounter: Payer: Self-pay | Admitting: Internal Medicine

## 2012-11-17 ENCOUNTER — Ambulatory Visit (INDEPENDENT_AMBULATORY_CARE_PROVIDER_SITE_OTHER): Payer: Medicare Other | Admitting: Internal Medicine

## 2012-11-17 VITALS — BP 118/74 | HR 68 | Temp 98.5°F

## 2012-11-17 DIAGNOSIS — B349 Viral infection, unspecified: Secondary | ICD-10-CM

## 2012-11-17 DIAGNOSIS — H6590 Unspecified nonsuppurative otitis media, unspecified ear: Secondary | ICD-10-CM

## 2012-11-17 DIAGNOSIS — H6593 Unspecified nonsuppurative otitis media, bilateral: Secondary | ICD-10-CM

## 2012-11-17 DIAGNOSIS — J309 Allergic rhinitis, unspecified: Secondary | ICD-10-CM

## 2012-11-17 DIAGNOSIS — B9789 Other viral agents as the cause of diseases classified elsewhere: Secondary | ICD-10-CM

## 2012-11-17 DIAGNOSIS — J029 Acute pharyngitis, unspecified: Secondary | ICD-10-CM

## 2012-11-17 NOTE — Patient Instructions (Addendum)
Rapid strep screen is is negative. Consider allergy testing. Take Claritin 10 mg daily.

## 2012-11-17 NOTE — Progress Notes (Signed)
  Subjective:    Patient ID: Annette Beltran, female    DOB: [birth year], 67 y.o.   MRN: [id number]  HPI 67 year old white female who was here  December 6  and again December 23 with respiratory infection symptoms. In early December, she had 2  Zithromax Z-Paks. On December 23 she got an injection of Depo-Medrol which she said helped a great deal. She did rest of the holidays and didn't push herself which I think also help. She was clearly under a lot of stress with family issues. This past weekend she entertained her brother and his wife. This coming weekend she has folks coming to her house for an exposition of retail clothing. She says she doesn't feel well. Says that her throat hurts in her ears hurt. She has no fever. She says she did see Dr. Shella Maxim a while back who did not think she needed allergy testing. She was sent to an ENT physician regarding nosebleeds. I am wondering if some of the symptoms could be allergy related since they seem to be recurrent and frequent.    Review of Systems     Objective:   Physical Exam HEENT exam: Has bilateral serous otitis media with TMs full bilaterally. Pharynx is only very slightly injected. Rapid strep screen is negative. Neck is supple without adenopathy. Chest is clear.        Assessment & Plan:  Persistent bilateral serous otitis media  Pharyngitis  Possible viral syndrome versus allergic rhinitis  Plan: I did not give patient any medication at all today. Suggested she try Claritin 10 mg daily. Do not take over-the-counter decongestants. She has a history of splenic infarction. She may want to consider allergy testing however she tells me today she is about to have dental implants on February 3. She will be on Cleocin preoperatively per Dr. Retta Mac. She does not want to pursue allergy testing at this point in time. She is to call if symptoms do not improve or become worse such as discolored nasal drainage, discolored sputum production, fever  chills etc.

## 2013-01-05 ENCOUNTER — Other Ambulatory Visit: Payer: Medicare Other | Admitting: Internal Medicine

## 2013-01-05 DIAGNOSIS — M810 Age-related osteoporosis without current pathological fracture: Secondary | ICD-10-CM

## 2013-01-05 DIAGNOSIS — Z79899 Other long term (current) drug therapy: Secondary | ICD-10-CM

## 2013-01-05 DIAGNOSIS — Z0000 Encounter for general adult medical examination without abnormal findings: Secondary | ICD-10-CM

## 2013-01-05 DIAGNOSIS — E559 Vitamin D deficiency, unspecified: Secondary | ICD-10-CM

## 2013-01-05 LAB — CBC WITH DIFFERENTIAL/PLATELET
Eosinophils Absolute: 0.4 10*3/uL (ref 0.0–0.7)
HCT: 42.3 % (ref 36.0–46.0)
Hemoglobin: 15 g/dL (ref 12.0–15.0)
Lymphs Abs: 2.2 10*3/uL (ref 0.7–4.0)
MCH: 32.3 pg (ref 26.0–34.0)
MCHC: 35.5 g/dL (ref 30.0–36.0)
Monocytes Absolute: 0.8 10*3/uL (ref 0.1–1.0)
Monocytes Relative: 9 % (ref 3–12)
Neutro Abs: 5.8 10*3/uL (ref 1.7–7.7)
Neutrophils Relative %: 62 % (ref 43–77)
RBC: 4.65 MIL/uL (ref 3.87–5.11)

## 2013-01-05 LAB — LIPID PANEL
Cholesterol: 211 mg/dL — ABNORMAL HIGH (ref 0–200)
HDL: 75 mg/dL (ref >39)
Triglycerides: 115 mg/dL (ref <150)

## 2013-01-05 LAB — COMPREHENSIVE METABOLIC PANEL
Albumin: 4.2 g/dL (ref 3.5–5.2)
BUN: 15 mg/dL (ref 6–23)
CO2: 23 mEq/L (ref 19–32)
Glucose, Bld: 99 mg/dL (ref 70–99)
Potassium: 4.7 mEq/L (ref 3.5–5.3)
Sodium: 139 mEq/L (ref 135–145)
Total Protein: 7.1 g/dL (ref 6.0–8.3)

## 2013-01-06 ENCOUNTER — Encounter: Payer: Medicare Other | Admitting: Internal Medicine

## 2013-01-10 ENCOUNTER — Ambulatory Visit (INDEPENDENT_AMBULATORY_CARE_PROVIDER_SITE_OTHER): Payer: BLUE CROSS/BLUE SHIELD | Admitting: Internal Medicine

## 2013-01-10 ENCOUNTER — Encounter: Payer: Self-pay | Admitting: Internal Medicine

## 2013-01-10 VITALS — BP 142/74 | HR 84 | Temp 99.7°F

## 2013-01-10 DIAGNOSIS — H6690 Otitis media, unspecified, unspecified ear: Secondary | ICD-10-CM

## 2013-01-10 DIAGNOSIS — H6693 Otitis media, unspecified, bilateral: Secondary | ICD-10-CM

## 2013-01-10 DIAGNOSIS — J069 Acute upper respiratory infection, unspecified: Secondary | ICD-10-CM

## 2013-01-11 ENCOUNTER — Encounter: Payer: Self-pay | Admitting: Internal Medicine
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
## 2013-01-11 NOTE — Patient Instructions (Addendum)
Take Zithromax Z-Pak 2 tablets day one followed by 1 tablet days 2 through 5.

## 2013-01-30 ENCOUNTER — Encounter: Payer: Medicare Other | Admitting: Internal Medicine

## 2013-02-03 ENCOUNTER — Ambulatory Visit (INDEPENDENT_AMBULATORY_CARE_PROVIDER_SITE_OTHER): Payer: Medicare Other | Admitting: Internal Medicine

## 2013-02-03 ENCOUNTER — Encounter: Payer: Self-pay | Admitting: Internal Medicine

## 2013-02-03 ENCOUNTER — Other Ambulatory Visit (HOSPITAL_COMMUNITY)
Admission: RE | Admit: 2013-02-03 | Discharge: 2013-02-03 | Disposition: A | Discharge status: Home / Self Care | Payer: Medicare Other | Source: Ambulatory Visit | Attending: Internal Medicine | Admitting: Internal Medicine

## 2013-02-03 VITALS — BP 140/76 | HR 76 | Temp 98.7°F | Ht 64.25 in | Wt 174.0 lb

## 2013-02-03 DIAGNOSIS — Z853 Personal history of malignant neoplasm of breast: Secondary | ICD-10-CM

## 2013-02-03 DIAGNOSIS — H6590 Unspecified nonsuppurative otitis media, unspecified ear: Secondary | ICD-10-CM

## 2013-02-03 DIAGNOSIS — Z0000 Encounter for general adult medical examination without abnormal findings: Secondary | ICD-10-CM

## 2013-02-03 DIAGNOSIS — M949 Disorder of cartilage, unspecified: Secondary | ICD-10-CM

## 2013-02-03 DIAGNOSIS — H6593 Unspecified nonsuppurative otitis media, bilateral: Secondary | ICD-10-CM

## 2013-02-03 DIAGNOSIS — F411 Generalized anxiety disorder: Secondary | ICD-10-CM

## 2013-02-03 DIAGNOSIS — Z124 Encounter for screening for malignant neoplasm of cervix: Secondary | ICD-10-CM | POA: Insufficient documentation

## 2013-02-03 DIAGNOSIS — K52831 Collagenous colitis: Secondary | ICD-10-CM

## 2013-02-03 DIAGNOSIS — M8580 Other specified disorders of bone density and structure, unspecified site: Secondary | ICD-10-CM

## 2013-02-03 DIAGNOSIS — R609 Edema, unspecified: Secondary | ICD-10-CM

## 2013-02-03 DIAGNOSIS — K5289 Other specified noninfective gastroenteritis and colitis: Secondary | ICD-10-CM

## 2013-02-03 DIAGNOSIS — Z8639 Personal history of other endocrine, nutritional and metabolic disease: Secondary | ICD-10-CM

## 2013-02-03 DIAGNOSIS — J309 Allergic rhinitis, unspecified: Secondary | ICD-10-CM

## 2013-02-03 DIAGNOSIS — I774 Celiac artery compression syndrome: Secondary | ICD-10-CM

## 2013-02-03 DIAGNOSIS — Z87891 Personal history of nicotine dependence: Secondary | ICD-10-CM
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
## 2013-02-07 ENCOUNTER — Other Ambulatory Visit: Payer: Self-pay

## 2013-02-07 MED ORDER — SPIRONOLACTONE 100 MG PO TABS: 100.0000 mg | ORAL_TABLET | Freq: Every day | ORAL | Status: DC

## 2013-04-27 ENCOUNTER — Telehealth: Payer: Self-pay

## 2013-04-27 DIAGNOSIS — N760 Acute vaginitis: Secondary | ICD-10-CM

## 2013-04-27 DIAGNOSIS — K1230 Oral mucositis (ulcerative), unspecified: Secondary | ICD-10-CM

## 2013-04-27 DIAGNOSIS — K121 Other forms of stomatitis: Secondary | ICD-10-CM

## 2013-04-27 MED ORDER — MAGIC MOUTHWASH W/LIDOCAINE: 10.0000 mL | Freq: Four times a day (QID) | ORAL | Status: DC

## 2013-04-27 NOTE — Telephone Encounter (Signed)
Patient declined the Diflucan. Has already purchased otc Monistat 3 day treatment.

## 2013-04-27 NOTE — Telephone Encounter (Signed)
Has several canker sores in her mouth, also a yeast infection for which she is using Vagisil externally only. Advised to get some OTC Monistat or Gynelotrimin to use internally. She voices understanding.

## 2013-04-27 NOTE — Telephone Encounter (Signed)
Prescribe Magic Mouthwash 16 oz 2 tsp swish do not swallow qid. Also, Diflucan 150 mg po once.

## 2013-05-01 ENCOUNTER — Telehealth: Payer: Self-pay | Admitting: Internal Medicine

## 2013-05-01 NOTE — Telephone Encounter (Signed)
Patient would like to be excused from jury duty. She states she has a history of colitis and when she get nervous she has diarrhea. She is scheduled for duty on 05/30/13 at [facility]. Juror number [id number]. She understands that Dr. Juanda Chance is out of the office this week and will not answer request until next week.

## 2013-05-01 NOTE — Telephone Encounter (Signed)
She ought to have an appointment first since  Last OV 08/2011 and she did not want to have a treatment for collagenous colitis. If she is having diarrhea then we need to consider Entecort or Prednisone.

## 2013-05-02 NOTE — Telephone Encounter (Signed)
Spoke with patient and scheduled on 05/16/13 at 8:45 AM with Dr. Juanda Chance.

## 2013-05-12 ENCOUNTER — Other Ambulatory Visit: Payer: Self-pay | Admitting: Internal Medicine

## 2013-05-12 NOTE — Telephone Encounter (Signed)
Refill x 6 months 

## 2013-05-16 ENCOUNTER — Ambulatory Visit (INDEPENDENT_AMBULATORY_CARE_PROVIDER_SITE_OTHER): Payer: Medicare Other | Admitting: Internal Medicine

## 2013-05-16 ENCOUNTER — Encounter: Payer: Self-pay | Admitting: Internal Medicine

## 2013-05-16 VITALS — BP 130/60 | HR 84 | Ht 64.5 in | Wt 175.5 lb

## 2013-05-16 DIAGNOSIS — K5289 Other specified noninfective gastroenteritis and colitis: Secondary | ICD-10-CM

## 2013-05-16 MED ORDER — BUDESONIDE 3 MG PO CP24: 3.0000 mg | ORAL_CAPSULE | ORAL | Status: DC

## 2013-05-16 NOTE — Patient Instructions (Addendum)
We have sent the following medications to your pharmacy for you to pick up at your convenience: entocort  We have given you a jury letter today.  CC: Dr [email or web address]

## 2013-05-16 NOTE — Progress Notes (Signed)
Annette Beltran 1946/07/15 MRN [id number]   History of Present Illness:  This is a 67 year old white female with chronic diarrhea, up to 5 bowel movements a day, usually in the mornings. She has a history of collagenous colitis on a colonoscopy in 2004 and again in October 2012. Biopsies showed intraepithelial lymphocytes and subepithelial collagen. She has declined using steroids in the past but she has called recently wanting an excuse for jury duty on account of the diarrhea. I asked her to come to discuss her diarrhea again. She agrees that her daily schedule is limited by her frequent diarrhea and urgency. She denies any rectal bleeding or abdominal pain. Her sprue profile in the past was negative. She has been reluctant to take steroids again based on her past experience. She has a history of splenic infarcts.   Past Medical History  Diagnosis Date  . Breast cancer   . Macular degeneration   . Fluid retention   . Colitis, collagenous   . Vitamin D deficiency   . Osteopenia   . Celiac artery stenosis   . Splenic infarction   . Arthritis   . History of blood clots   . Anxiety   . Arrhythmia   . Shingles    Past Surgical History  Procedure Laterality Date  . Foot surgery  2003    x 3  . Breast lumpectomy      right  . Meniscus repair Right     reports that she quit smoking about 12 years ago. Her smoking use included Cigarettes. She has a 9 pack-year smoking history. She has never used smokeless tobacco. She reports that  drinks alcohol. She reports that she does not use illicit drugs. family history is negative for Colon cancer. Allergies  Allergen Reactions  . Codeine Nausea And Vomiting  . Tequin     Severe stomach pain  . Penicillins Nausea Only and Rash        Review of Systems: Stable weight. No abdominal pain  The remainder of the 10 point ROS is negative except as outlined in H&P   Physical Exam: General appearance  Well developed, in no distress. Eyes- non  icteric. HEENT nontraumatic, normocephalic. Mouth no lesions, tongue papillated, no cheilosis. Neck supple without adenopathy, thyroid not enlarged, no carotid bruits, no JVD. Lungs Clear to auscultation bilaterally. Cor normal S1, normal S2, regular rhythm, no murmur,  quiet precordium. Abdomen: Soft nontender abdomen with slightly hyperactive bowel sounds. No tenderness. No tympany.  Rectal: Not done. Extremities no pedal edema. Skin no lesions. Neurological alert and oriented x 3. Psychological normal mood and affect.  Assessment and Plan:  Problem #61 67 year old white female with collagenous colitis of at least 10 years duration. We have discussed again  treatment with Entocort and she is willing to try a small dose for 30 days.   Another alternative is for her to take antispasmodics or antidiarrheals such as Imodium. She is up-to-date on her colonoscopy.  Per patient's request, we will provide an excuse for jury duty today due to her diarrhea.  05/16/2013 Lina Sar

## 2013-05-22 ENCOUNTER — Other Ambulatory Visit: Payer: Self-pay

## 2013-07-10 ENCOUNTER — Encounter: Payer: Self-pay | Admitting: Internal Medicine

## 2013-07-10 ENCOUNTER — Ambulatory Visit (INDEPENDENT_AMBULATORY_CARE_PROVIDER_SITE_OTHER): Payer: Medicare Other | Admitting: Internal Medicine

## 2013-07-10 VITALS — BP 136/76 | HR 72 | Temp 99.2°F | Wt 177.5 lb

## 2013-07-10 DIAGNOSIS — R1012 Left upper quadrant pain: Secondary | ICD-10-CM

## 2013-07-23 NOTE — Patient Instructions (Addendum)
Continue same medications and return in one year or as needed. 

## 2013-07-23 NOTE — Progress Notes (Signed)
  Subjective:    Patient ID: Annette Beltran, female    DOB: 08-07-1946, 67 y.o.   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 Had onset of left upper quadrant pain yesterday. No nausea, vomiting, fever, or chills. She also has a history of collagenous colitis and a remote history of breast cancer. Is very busy. 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 Is under a lot of stress with family issues with her daughter and her husband's son. She is very anxious and worried about recurrence of splenic infarction. No constipation or diarrhea. In fact seems to be better today with less pain. Pain is not nearly as severe as when she had the splenic infarctions.    Review of Systems     Objective:   Physical Exam Bowel sounds are active. Abdomen is soft and nondistended. No hepatosplenomegaly appreciated. Mild left upper quadrant tenderness without rebound. Chest clear to auscultation. Cardiac exam regular rate and rhythm normal S1 and S2. Skin is warm and dry. Affect is anxious.        Assessment & Plan:  Left upper quadrant abdominal pain-suspect musculoskeletal pain  Anxiety-discussed situational stress at length  Plan: We will continue close observation. She will call if symptoms persist or worsen.  Time spent with patient 25 minutes

## 2013-07-23 NOTE — Patient Instructions (Addendum)
Continue close observation. Call if symptoms do not improve or worsen.

## 2013-08-16 ENCOUNTER — Ambulatory Visit (INDEPENDENT_AMBULATORY_CARE_PROVIDER_SITE_OTHER): Payer: Medicare Other | Admitting: Internal Medicine

## 2013-08-16 DIAGNOSIS — Z23 Encounter for immunization: Secondary | ICD-10-CM

## 2013-08-21 ENCOUNTER — Ambulatory Visit: Payer: Medicare Other | Admitting: Internal Medicine

## 2013-08-21 ENCOUNTER — Telehealth: Payer: Self-pay | Admitting: Internal Medicine

## 2013-08-21 ENCOUNTER — Emergency Department (HOSPITAL_COMMUNITY)
Admission: EM | Admit: 2013-08-21 | Discharge: 2013-08-21 | Disposition: A | Discharge status: Home / Self Care | Payer: Medicare Other | Attending: Emergency Medicine | Admitting: Emergency Medicine

## 2013-08-21 ENCOUNTER — Emergency Department (HOSPITAL_COMMUNITY): Payer: Medicare Other

## 2013-08-21 ENCOUNTER — Encounter (HOSPITAL_COMMUNITY): Payer: Self-pay | Admitting: Emergency Medicine

## 2013-08-21 DIAGNOSIS — Z7982 Long term (current) use of aspirin: Secondary | ICD-10-CM | POA: Insufficient documentation

## 2013-08-21 DIAGNOSIS — E559 Vitamin D deficiency, unspecified: Secondary | ICD-10-CM | POA: Insufficient documentation

## 2013-08-21 DIAGNOSIS — Z862 Personal history of diseases of the blood and blood-forming organs and certain disorders involving the immune mechanism: Secondary | ICD-10-CM | POA: Insufficient documentation

## 2013-08-21 DIAGNOSIS — H3530 Unspecified macular degeneration: Secondary | ICD-10-CM | POA: Insufficient documentation

## 2013-08-21 DIAGNOSIS — Z853 Personal history of malignant neoplasm of breast: Secondary | ICD-10-CM | POA: Insufficient documentation

## 2013-08-21 DIAGNOSIS — Z8619 Personal history of other infectious and parasitic diseases: Secondary | ICD-10-CM | POA: Insufficient documentation

## 2013-08-21 DIAGNOSIS — Z880 Allergy status to penicillin: Secondary | ICD-10-CM | POA: Insufficient documentation

## 2013-08-21 DIAGNOSIS — F411 Generalized anxiety disorder: Secondary | ICD-10-CM | POA: Insufficient documentation

## 2013-08-21 DIAGNOSIS — Z8679 Personal history of other diseases of the circulatory system: Secondary | ICD-10-CM | POA: Insufficient documentation

## 2013-08-21 DIAGNOSIS — Z79899 Other long term (current) drug therapy: Secondary | ICD-10-CM | POA: Insufficient documentation

## 2013-08-21 DIAGNOSIS — Z8719 Personal history of other diseases of the digestive system: Secondary | ICD-10-CM | POA: Insufficient documentation

## 2013-08-21 DIAGNOSIS — R1012 Left upper quadrant pain: Secondary | ICD-10-CM | POA: Insufficient documentation

## 2013-08-21 DIAGNOSIS — Z87891 Personal history of nicotine dependence: Secondary | ICD-10-CM | POA: Insufficient documentation

## 2013-08-21 DIAGNOSIS — Z8739 Personal history of other diseases of the musculoskeletal system and connective tissue: Secondary | ICD-10-CM | POA: Insufficient documentation

## 2013-08-21 DIAGNOSIS — R109 Unspecified abdominal pain: Secondary | ICD-10-CM

## 2013-08-21 DIAGNOSIS — Z86718 Personal history of other venous thrombosis and embolism: Secondary | ICD-10-CM | POA: Insufficient documentation

## 2013-08-21 DIAGNOSIS — M129 Arthropathy, unspecified: Secondary | ICD-10-CM | POA: Insufficient documentation

## 2013-08-21 DIAGNOSIS — Z8639 Personal history of other endocrine, nutritional and metabolic disease: Secondary | ICD-10-CM | POA: Insufficient documentation

## 2013-08-21 LAB — CBC WITH DIFFERENTIAL/PLATELET
Basophils Absolute: 0.1 10*3/uL (ref 0.0–0.1)
Basophils Relative: 1 % (ref 0–1)
Eosinophils Absolute: 0.8 10*3/uL — ABNORMAL HIGH (ref 0.0–0.7)
Lymphocytes Relative: 16 % (ref 12–46)
MCH: 31.2 pg (ref 26.0–34.0)
MCHC: 35.3 g/dL (ref 30.0–36.0)
Monocytes Relative: 9 % (ref 3–12)
Neutro Abs: 6.4 10*3/uL (ref 1.7–7.7)
Neutrophils Relative %: 66 % (ref 43–77)
Platelets: 317 10*3/uL (ref 150–400)
RDW: 12.9 % (ref 11.5–15.5)

## 2013-08-21 LAB — POCT I-STAT, CHEM 8
Calcium, Ion: 1.2 mmol/L (ref 1.13–1.30)
Chloride: 103 mEq/L (ref 96–112)
Glucose, Bld: 100 mg/dL — ABNORMAL HIGH (ref 70–99)
HCT: 41 % (ref 36.0–46.0)
Hemoglobin: 13.9 g/dL (ref 12.0–15.0)
Potassium: 4.1 mEq/L (ref 3.5–5.1)
Sodium: 140 mEq/L (ref 135–145)

## 2013-08-21 MED ORDER — IOHEXOL 350 MG/ML SOLN
100.0000 mL | Freq: Once | INTRAVENOUS | Status: AC | PRN
  Administered 2013-08-21: 100 mL via INTRAVENOUS

## 2013-08-21 NOTE — ED Notes (Signed)
Patient reports that she has been c/o left upper abdominal pain x 1 month. Patient saw her PCP and was told she had a pulled muscle and was told to take Aleve. Patient states at times the Aleve does not relieve the pain. Patient c/o increased pain when she takes a deep breath. [place] also reports that she was told to take Nexium when she c/o mid epigastric pain x 1 week.

## 2013-08-21 NOTE — Telephone Encounter (Signed)
Pt. Seen here with LUQ abdominal pain on Sept 8th thought to be musculoskeletal in nature but has hx of slenic infarcts. Called to day c/o of recurrent abd pain. Advise to go to ED for evaluation with hx of splenic infarcts.

## 2013-08-21 NOTE — ED Provider Notes (Signed)
CSN: [id number]     Arrival date & time 08/21/13  1231 History   First MD Initiated Contact with Patient 08/21/13 1400     Chief Complaint  Patient presents with  . Abdominal Pain   (Consider location/radiation/quality/duration/timing/severity/associated sxs/prior Treatment) HPI Comments: 67 year old female, history of breast cancer as well as a splenic infarct, states that over the last month there has been left upper quadrant abdominal pain for the last month, this is intermittent, worse with deep breathing, not associated with swelling of the legs. Nothing seems to make this better, no associated fevers or coughing. She did note that she was short of breath while walking in the last couple of days. She has seen her family doctor, initially treated conservatively for possible muscle strain symptoms have worsened and that she was sent her from her family doctor's office. She does state that she is improving over the last week by taking Nexium.  Patient is a 67 y.o. female presenting with abdominal pain. The history is provided by the patient.  Abdominal Pain   Past Medical History  Diagnosis Date  . Breast cancer   . Macular degeneration   . Fluid retention   . Colitis, collagenous   . Vitamin D deficiency   . Osteopenia   . Celiac artery stenosis   . Splenic infarction   . Arthritis   . History of blood clots   . Anxiety   . Arrhythmia   . Shingles    Past Surgical History  Procedure Laterality Date  . Foot surgery  2003    x 3  . Breast lumpectomy      right  . Meniscus repair Right    Family History  Problem Relation Age of Onset  . Colon cancer Neg Hx   . Aneurysm Mother   . Cancer Father   . Cancer Brother   . Heart failure Other    History  Substance Use Topics  . Smoking status: Former Smoker -- 0.50 packs/day for 18 years    Types: Cigarettes    Quit date: 11/02/2000  . Smokeless tobacco: Never Used  . Alcohol Use: Yes     Comment: 1-2 alcohol drinks/day    OB History   Grav Para Term Preterm Abortions TAB SAB Ect Mult Living                 Review of Systems  Gastrointestinal: Positive for abdominal pain.  All other systems reviewed and are negative.    Allergies  Codeine; Tequin; and Penicillins  Home Medications   Current Outpatient Rx  Name  Route  Sig  Dispense  Refill  . ALPRAZolam (XANAX) 0.5 MG tablet   Oral   Take 0.5 mg by mouth at bedtime as needed for sleep.         . Ascorbic Acid (VITAMIN C PO)   Oral   Take 1 tablet by mouth 2 (two) times daily.         Marland Kitchen aspirin 81 MG tablet   Oral   Take 81 mg by mouth daily.         Marland Kitchen b complex vitamins tablet   Oral   Take 1 tablet by mouth daily.         . diphenhydrAMINE (BENADRYL) 25 MG tablet   Oral   Take 25 mg by mouth every 6 (six) hours as needed for itching or allergies.         . Esomeprazole Magnesium (NEXIUM PO)  Oral   Take 1 tablet by mouth daily.         Marland Kitchen loratadine (CLARITIN) 10 MG tablet   Oral   Take 10 mg by mouth daily.         . Multiple Vitamins-Minerals (PRESERVISION AREDS PO)   Oral   Take by mouth.           . naproxen sodium (ANAPROX) 220 MG tablet   Oral   Take 220 mg by mouth as needed (pain).          Marland Kitchen spironolactone (ALDACTONE) 100 MG tablet   Oral   Take 1 tablet (100 mg total) by mouth daily.   90 tablet   3    BP 111/51  Pulse 70  Temp(Src) 98.4 F (36.9 C) (Oral)  Resp 16  SpO2 97% Physical Exam  Nursing note and vitals reviewed. Constitutional: She appears well-developed and well-nourished. No distress.  HENT:  Head: Normocephalic and atraumatic.  Mouth/Throat: Oropharynx is clear and moist. No oropharyngeal exudate.  Eyes: Conjunctivae and EOM are normal. Pupils are equal, round, and reactive to light. Right eye exhibits no discharge. Left eye exhibits no discharge. No scleral icterus.  Neck: Normal range of motion. Neck supple. No JVD present. No thyromegaly present.   Cardiovascular: Normal rate, regular rhythm, normal heart sounds and intact distal pulses.  Exam reveals no gallop and no friction rub.   No murmur heard. Pulmonary/Chest: Effort normal and breath sounds normal. No respiratory distress. She has no wheezes. She has no rales.  Abdominal: Soft. Bowel sounds are normal. She exhibits no distension and no mass. There is tenderness ( Minimal epigastric and left upper quadrant tenderness, no guarding, no masses).  Musculoskeletal: Normal range of motion. She exhibits no edema and no tenderness.  Lymphadenopathy:    She has no cervical adenopathy.  Neurological: She is alert. Coordination normal.  Skin: Skin is warm and dry. No rash noted. No erythema.  Psychiatric: She has a normal mood and affect. Her behavior is normal.    ED Course  Procedures (including critical care time) Labs Review Labs Reviewed  CBC WITH DIFFERENTIAL - Abnormal; Notable for the following:    Eosinophils Relative 8 (*)    Eosinophils Absolute 0.8 (*)    All other components within normal limits  POCT I-STAT, CHEM 8 - Abnormal; Notable for the following:    Glucose, Bld 100 (*)    All other components within normal limits   Imaging Review Ct Angio Chest Pe W/cm &/or Wo Cm  08/21/2013   CLINICAL DATA:  Patient complains of left upper abdominal pain for 1 month, worse with deep inspiration, patient has history of blood clots and breast cancer  EXAM: CT ANGIOGRAPHY CHEST WITH CONTRAST  TECHNIQUE: Multidetector CT imaging of the chest was performed using the standard protocol during bolus administration of intravenous contrast. Multiplanar CT image reconstructions including MIPs were obtained to evaluate the vascular anatomy.  CONTRAST:  OMNIPAQUE IOHEXOL 350 MG/ML SOLN  COMPARISON:  06/08/2011  FINDINGS: Mild dependent atelectasis with no significant pulmonary parenchymal abnormality. No filling defect in the pulmonary arterial system. Mild cardiac enlargement. Thoracic  aorta shows no evidence of dissection or dilatation. There is mild atherosclerotic calcification of the aorta and coronary arteries. No significant hilar or mediastinal adenopathy. No pleural effusions. No significant soft tissue or axillary abnormalities. No acute osseous findings. Scans through the upper abdomen show no significant abnormalities.  Review of the MIP images confirms the above findings.  IMPRESSION: No evidence of pulmonary arterial embolism. No acute abnormalities.   Electronically Signed   By: Esperanza Heir M.D.   On: 08/21/2013 15:36    EKG Interpretation     Ventricular Rate:  65 PR Interval:    QRS Duration: 89 QT Interval:  422 QTC Calculation: 439 R Axis:   71 Text Interpretation:  Atrial fibrillation Low voltage, precordial leads since last tracing no significant change            MDM   1. Abdominal  pain, other specified site    Overall the patient is well-appearing, normal vital signs, no hypoxia or tachycardia, history of prior venous thromboembolism or arterial thromboembolism with splenic infarct. Her breast cancer predisposes her to thromboembolic disease, with her shortness of breath and left-sided pain would be concern for pulmonary embolism as well as recurrent splenic injury. CT scan ordered, labs, the patient appeared comfortable and is only having intermittent pain at this time.  Pt has normal CT, labs reassuring, can f/u with PMD.  Meds given in ED:  Medications  iohexol (OMNIPAQUE) 350 MG/ML injection 100 mL (100 mLs Intravenous Contrast Given 08/21/13 1504)    New Prescriptions   No medications on file      Vida Roller, MD 08/21/13 1614

## 2013-08-21 NOTE — ED Notes (Signed)
Bed: I[facility]47 Expected date:  Expected time:  Means of arrival:  Comments: [place]

## 2013-08-22 ENCOUNTER — Encounter: Payer: Self-pay | Admitting: Internal Medicine

## 2013-08-22 ENCOUNTER — Ambulatory Visit
Admission: RE | Admit: 2013-08-22 | Discharge: 2013-08-22 | Disposition: A | Discharge status: Home / Self Care | Payer: Medicare Other | Source: Ambulatory Visit | Attending: Internal Medicine | Admitting: Internal Medicine

## 2013-08-22 ENCOUNTER — Ambulatory Visit (INDEPENDENT_AMBULATORY_CARE_PROVIDER_SITE_OTHER): Payer: Medicare Other | Admitting: Internal Medicine

## 2013-08-22 VITALS — BP 122/70 | HR 60 | Temp 98.6°F | Wt 166.0 lb

## 2013-08-22 DIAGNOSIS — M25519 Pain in unspecified shoulder: Secondary | ICD-10-CM

## 2013-08-22 DIAGNOSIS — R109 Unspecified abdominal pain: Secondary | ICD-10-CM

## 2013-08-22 DIAGNOSIS — M25512 Pain in left shoulder: Secondary | ICD-10-CM

## 2013-08-22 DIAGNOSIS — R1012 Left upper quadrant pain: Secondary | ICD-10-CM

## 2013-08-22 LAB — COMPREHENSIVE METABOLIC PANEL
Albumin: 4 g/dL (ref 3.5–5.2)
BUN: 14 mg/dL (ref 6–23)
CO2: 23 mEq/L (ref 19–32)
Calcium: 9.6 mg/dL (ref 8.4–10.5)
Chloride: 101 mEq/L (ref 96–112)
Creat: 0.8 mg/dL (ref 0.50–1.10)
Glucose, Bld: 138 mg/dL — ABNORMAL HIGH (ref 70–99)
Potassium: 4.1 mEq/L (ref 3.5–5.3)
Sodium: 138 mEq/L (ref 135–145)

## 2013-08-22 LAB — AMYLASE: Amylase: 124 U/L — ABNORMAL HIGH (ref 0–105)

## 2013-08-22 LAB — LIPASE: Lipase: 446 U/L — ABNORMAL HIGH (ref 0–75)

## 2013-08-22 LAB — CBC WITH DIFFERENTIAL/PLATELET
HCT: 41.4 % (ref 36.0–46.0)
Hemoglobin: 14 g/dL (ref 12.0–15.0)
Lymphocytes Relative: 17 % (ref 12–46)
MCHC: 33.8 g/dL (ref 30.0–36.0)
Monocytes Absolute: 0.4 10*3/uL (ref 0.1–1.0)
Neutro Abs: 9.2 10*3/uL — ABNORMAL HIGH (ref 1.7–7.7)
Neutrophils Relative %: 79 % — ABNORMAL HIGH (ref 43–77)
Platelets: 329 10*3/uL (ref 150–400)
RDW: 12.1 % (ref 11.5–15.5)
WBC: 11.6 10*3/uL — ABNORMAL HIGH (ref 4.0–10.5)

## 2013-08-22 MED ORDER — IOHEXOL 300 MG/ML  SOLN
100.0000 mL | Freq: Once | INTRAMUSCULAR | Status: AC | PRN
  Administered 2013-08-22: 100 mL via INTRAVENOUS

## 2013-08-22 MED ORDER — ALPRAZOLAM 0.5 MG PO TABS: 0.5000 mg | ORAL_TABLET | Freq: Two times a day (BID) | ORAL | Status: DC

## 2013-08-22 MED ORDER — IOHEXOL 300 MG/ML  SOLN
40.0000 mL | Freq: Once | INTRAMUSCULAR | Status: AC | PRN
  Administered 2013-08-22: 40 mL via ORAL

## 2013-08-22 NOTE — Addendum Note (Signed)
Addended by: Fayne Mediate on: 08/22/2013 02:28 PM   Modules accepted: Orders

## 2013-08-22 NOTE — Patient Instructions (Signed)
Increase Xanax to twice daily. Go for CT of the abdomen with contrast, left shoulder and left rib detail films today. Take Percogesic for pain.

## 2013-08-22 NOTE — Progress Notes (Signed)
  Subjective:    Patient ID: Annette Beltran, female    DOB: 1946/08/26, 67 y.o.   MRN: [id number]  HPI Patient went to the emergency department at my direction yesterday complaining of left upper quadrant abdominal pain. She had a CT of the abdomen with images into the upper abdomen which showed no evidence of splenic infarction, no PE and no tumor. She was offered pain medication which she declined. She cannot tolerate codeine. Around midnight she awakened with left upper quadrant abdominal pain which she says radiated up into her left shoulder. She took 2 Percogesic tablets and in about 45 minutes was able to get pain relief. Patient says she is very tense. She only takes Xanax once a day. Has a lot of situational stress. History of collagenous colitis. She saw Dr. Juanda Chance in September. Patient has several bowel movements a day due to colitis. Feels better after bowel movement. Recently started taking some Nexium for presumed GE reflux. Had some epigastric and substernal chest pain at the time. This is improved. Says she has had 2 severe attacks of pain in the left upper quadrant. One was last night and one was about 3 weeks ago at a ball game in [place]. At the ball game she took 2 Aleve and one aspirin and got better. No nausea and vomiting. No  chills or fever. Lab work was done in the emergency department.    Review of Systems     Objective:   Physical Exam she is anxious and tense. Skin is warm and dry. Nodes none. Chest is clear to auscultation without rales or wheezing. Cardiac exam regular rate and rhythm, tachycardia. Abdomen soft nondistended. Bowel sounds are active. She is tender in the left upper quadrant with slight rebound tenderness. She's tender in the epigastric area. She is tender left shoulder and left upper chest. She has full range of motion of the left shoulder. Extremities are without edema.        Assessment & Plan:  Left shoulder pain  Left chest wall pain  Left  upper quadrant abdominal pain  Anxiety  History of collagenous colitis  Epigastric pain  Plan: Patient will be sent for CT of the abdomen with contrast. She will have left rib detail and left shoulder films. She will take Percogesic for pain. She will continue with over-the-counter Nexium. She will increase Xanax to 0.5 mg twice daily.  45 minutes spent with patient

## 2013-08-23 ENCOUNTER — Telehealth: Payer: Self-pay | Admitting: *Deleted

## 2013-08-23 DIAGNOSIS — R109 Unspecified abdominal pain: Secondary | ICD-10-CM

## 2013-08-23 NOTE — Telephone Encounter (Signed)
Patient notified of appointment tomorrow, will Lucrezia Europe, [place] (in Dr. Regino Schultze office-quickest appt their office had), at 2pm.  Patient verbalized understanding and understanding of clear liquid/bland diet.

## 2013-08-24 ENCOUNTER — Ambulatory Visit (INDEPENDENT_AMBULATORY_CARE_PROVIDER_SITE_OTHER): Payer: Medicare Other | Admitting: Physician Assistant

## 2013-08-24 ENCOUNTER — Encounter: Payer: Self-pay | Admitting: Physician Assistant

## 2013-08-24 VITALS — BP 110/70 | HR 64 | Ht 64.5 in | Wt 173.2 lb

## 2013-08-24 DIAGNOSIS — R9389 Abnormal findings on diagnostic imaging of other specified body structures: Secondary | ICD-10-CM

## 2013-08-24 DIAGNOSIS — K859 Acute pancreatitis without necrosis or infection, unspecified: Secondary | ICD-10-CM

## 2013-08-24 NOTE — Patient Instructions (Signed)
We scheduled the MRI/MRCP for Sun 09-03-2013 at 2:00 PM.  [facility], [phone]. Arrive at 1:45 PM.  Have nothing to eat or drink for 4 hours prior to the test.    No Alcohol. Use Tylenol or aleve as needed. Use Demerol for severe pain. Stop Nexium.  We made  You an appointment with Dr. Juanda Chance on 09-26-2013 at 3:15 PM.

## 2013-08-24 NOTE — Progress Notes (Signed)
reviewed and agree, I will follow up, likely need an EUS or follow up imaging in 3 months

## 2013-08-24 NOTE — Progress Notes (Signed)
Subjective:    Patient ID: Annette Beltran, female    DOB: [birth year], 67 y.o.   MRN: [id number]  HPI  Annette Beltran is a pleasant 67 year old female known to Dr. Juanda Chance who has history of collagenous colitis. She's not currently maintained on any prescription medications and tries to manage her symptoms by dietary measures. She comes in today after being referred by [facility] for an episode of acute pancreatitis. Patient reports an episode of left upper abdominal pain which occurred about 3 weeks ago while she was at a sporting event. He said this episode lasted for about 2 hours was not associated with any nausea or vomiting and then seemed to resolve. Since that time she has had some tenderness and vague discomfort in her left and upper abdomen and somewhat decreased appetite. Earlier this week she had a second episode of more intense abdominal pain which again lasted for several hours and then E. stopped. She had Percocet at home and took to those which did seem to help. She was seen by Dr. Lenord Fellers had labs done showing a WBC of 11.6, hemoglobin of 14 electrolytes within normal limits liver function studies within normal limits, lipase of 446, and amylase of 124. She underwent CT scan of the abdomen and pelvis on 08/22/2013 which showed no calcified gallstones, there did appear to be slight prominence of the pancreatic duct in the distal body and tail of the pancreas and a small low attenuation structure emanating from the tail of the pancreas not seen on any previous images and may represent a pseudocyst. Also consider a small cystic pancreatic tail carcinoma. There was a small amount of pancreatic exudate present. Also noted some concentric narrowing of the proximal transverse colon near the hepatic flexure possibly due to under distention. Patient has been placed on a clear liquid diet and says that over the past 2 days she is actually having less discomfort and states she wants to make it clear that she has  never had severe pain but more discomfort. She is tolerating clear liquids without difficulty and is hungry. She has no complaints of cough shortness of breath fever chills or change in her bowel habits. She says normal for her is to have 4-5 bowel movements per day. She has not started any new medications other than Nexium which she just been taking for a week. She does drink alcohol the form of 1 generally 2 glasses per day. She's not had any episodes of prior pancreatitis.     Review of Systems  Constitutional: Negative.   HENT: Negative.   Eyes: Negative.   Respiratory: Negative.   Cardiovascular: Negative.   Gastrointestinal: Positive for abdominal pain and diarrhea.  Endocrine: Negative.   Genitourinary: Negative.   Musculoskeletal: Negative.   Skin: Negative.   Allergic/Immunologic: Negative.   Neurological: Negative.   Hematological: Negative.   Psychiatric/Behavioral: Negative.    Outpatient Prescriptions Prior to Visit  Medication Sig Dispense Refill  . ALPRAZolam (XANAX) 0.5 MG tablet Take 1 tablet (0.5 mg total) by mouth 2 (two) times daily.  60 tablet  3  . Ascorbic Acid (VITAMIN C PO) Take 1 tablet by mouth 2 (two) times daily.      Marland Kitchen aspirin 81 MG tablet Take 81 mg by mouth daily.      Marland Kitchen b complex vitamins tablet Take 1 tablet by mouth daily.      . diphenhydrAMINE (BENADRYL) 25 MG tablet Take 25 mg by mouth every 6 (six) hours as needed for itching or  allergies.      . Esomeprazole Magnesium (NEXIUM PO) Take 1 tablet by mouth daily.      Marland Kitchen loratadine (CLARITIN) 10 MG tablet Take 10 mg by mouth daily.      . Multiple Vitamins-Minerals (PRESERVISION AREDS PO) Take by mouth.        . spironolactone (ALDACTONE) 100 MG tablet Take 1 tablet (100 mg total) by mouth daily.  90 tablet  3   No facility-administered medications prior to visit.   Allergies  Allergen Reactions  . Codeine Nausea And Vomiting  . Tequin     Severe stomach pain  . Penicillins Nausea Only and  Rash   Patient Active Problem List   Diagnosis Date Noted  . Recurrent serous otitis media 04/01/2012  . Grief reaction 02/05/2012  . Osteopenia 01/03/2012  . Anxiety 07/30/2011  . 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 History of breast cancer 07/30/2011  . Collagenous colitis 06/21/2011    Class: History of  . RHINOSINUSITIS, CHRONIC 03/06/2010  . ALLERGIC RHINITIS 03/06/2010   History  Substance Use Topics  . Smoking status: Former Smoker -- 0.50 packs/day for 18 years    Types: Cigarettes    Quit date: 11/02/2000  . Smokeless tobacco: Never Used  . Alcohol Use: Yes     Comment: 1-2 alcohol drinks/day   family history includes Aneurysm in her mother; Cancer in her brother and father; Heart failure in her other. There is no history of Colon cancer.     Objective:   Physical Exam well-developed white female in no acute distress, pleasant blood pressure 110/70 pulse 64 height 5 foot 4 weight 173. HEENT; nontraumatic normocephalic EOMI PERRLA sclera anicteric, Supple ;no JVD, Cardiovascular; regular rate and rhythm with S1-S2 no murmur or gallop, Pulmonary; clear bilaterally, Abdomen ;soft very mildly tender in the epigastrium and left upper quadrant there is no guarding or rebound no palpable mass or hepatosplenomegaly, Bowel sounds are present, Rectal; exam not done, Extremities; no clubbing cyanosis or edema skin warm dry, Psych; mood and affect normal and appropriate.      Assessment & Plan: #1 #1  #47   #16  67 year old female with acute pancreatitis involving the tail of the pancreas with mild peripancreatic exudate possible early pseudocyst and prominence of the distal pancreatic duct. Etiology of pancreatitis is unclear, will need to rule out a small cystic pancreatic neoplasm in the tail of the pancreas. No gallstones seen on CT scan but gallbladder is in situ Patient does use alcohol regularly/generally 2 glasses of wine per day  #2 history of splenic infarct #3 history of  breast cancer #4 collagenous colitis stable  Plan; long discussion with patient today, will advance to low-fat full liquid diet then low-fat diet as she tolerates She will use Tylenol or a leave sparingly for pain and also has Demerol which was given to her by Dr. Lenord Fellers for more severe pain She is asked to stop EtOH use Will schedule for MRI abdomen/MRCP in 7-10 days to allow time for her pancreatitis to cool down She is aware to call for any worsening of symptoms in the interim and have scheduled for a followup visit with Dr. Juanda Chance in 2 weeks. She may need further workup with EUS depending on findings on MRI.

## 2013-08-25 ENCOUNTER — Ambulatory Visit: Payer: Medicare Other | Admitting: Internal Medicine

## 2013-08-31 ENCOUNTER — Telehealth: Payer: Self-pay | Admitting: Internal Medicine

## 2013-08-31 NOTE — Telephone Encounter (Signed)
Spoke with patient and reassured her that we will call with MRI results and plans as soon as we have results reviewed.

## 2013-09-03 ENCOUNTER — Ambulatory Visit
Admission: RE | Admit: 2013-09-03 | Discharge: 2013-09-03 | Disposition: A | Discharge status: Home / Self Care | Payer: Medicare Other | Source: Ambulatory Visit | Attending: Physician Assistant | Admitting: Physician Assistant

## 2013-09-03 DIAGNOSIS — R9389 Abnormal findings on diagnostic imaging of other specified body structures: Secondary | ICD-10-CM

## 2013-09-03 DIAGNOSIS — K859 Acute pancreatitis without necrosis or infection, unspecified: Secondary | ICD-10-CM

## 2013-09-03 MED ORDER — GADOBENATE DIMEGLUMINE 529 MG/ML IV SOLN
15.0000 mL | Freq: Once | INTRAVENOUS | Status: AC | PRN
  Administered 2013-09-03: 15 mL via INTRAVENOUS

## 2013-09-26 ENCOUNTER — Other Ambulatory Visit (INDEPENDENT_AMBULATORY_CARE_PROVIDER_SITE_OTHER): Payer: Medicare Other

## 2013-09-26 ENCOUNTER — Ambulatory Visit (INDEPENDENT_AMBULATORY_CARE_PROVIDER_SITE_OTHER): Payer: Medicare Other | Admitting: Internal Medicine

## 2013-09-26 ENCOUNTER — Encounter: Payer: Self-pay | Admitting: Internal Medicine

## 2013-09-26 VITALS — BP 120/70 | HR 66 | Ht 64.5 in | Wt 167.6 lb

## 2013-09-26 DIAGNOSIS — R932 Abnormal findings on diagnostic imaging of liver and biliary tract: Secondary | ICD-10-CM

## 2013-09-26 DIAGNOSIS — K862 Cyst of pancreas: Secondary | ICD-10-CM

## 2013-09-26 DIAGNOSIS — K859 Acute pancreatitis without necrosis or infection, unspecified: Secondary | ICD-10-CM

## 2013-09-26 LAB — SEDIMENTATION RATE: Sed Rate: 42 mm/hr — ABNORMAL HIGH (ref 0–22)

## 2013-09-26 LAB — AMYLASE: Amylase: 237 U/L — ABNORMAL HIGH (ref 27–131)

## 2013-09-26 NOTE — Progress Notes (Signed)
Annette Beltran 05-01-1946 MRN [id number]        History of Present Illness:  This is a 67 year old white female with collagenous colitis diagnosed in 2004 with subsequent hospitalization for splenic infarcts in 2012 and now with episode of acute pancreatitis in October 2014 presenting with acute abdominal pain which resolved within days. CT scan and MRI of the pancreas showed mildly dilated main pancreatic duct. Pancreatic body was atrophied.. 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 Her amylase and lipase was elevated. She saw Annette Beltran  and was put on strict low-fat diet. She has lost about 7 pounds and is feeling better well. She denies abdominal pain. Her collagenous colitis is still symptomatic causing her to have frequent diarrhea but she does not want to be treated for it. She drinks one to 2 glasses of wine/day and has  a positive family history of gallbladder disease in one of her cousins.The etiology of pancreatitis is unknown.   Past Medical History  Diagnosis Date  . Breast cancer   . Macular degeneration   . Fluid retention   . Colitis, collagenous   . Vitamin D deficiency   . Osteopenia   . Celiac artery stenosis   . Splenic infarction   . Arthritis   . History of blood clots   . Anxiety   . Arrhythmia   . Shingles   . Pancreatitis    Past Surgical History  Procedure Laterality Date  . Foot surgery  2003    x 3, 2 on right, 1 on left  . Breast lumpectomy Right     radiation for 6 weeks  . Meniscus repair Right     reports that she quit smoking about 12 years ago. Her smoking use included Cigarettes. She has a 9 pack-year smoking history. She has never used smokeless tobacco. She reports that she drinks alcohol. She reports that she does not use illicit drugs. family history includes Aneurysm in her mother; Heart failure in  her paternal aunt; Pancreatic cancer in her maternal grandmother; Prostate cancer in her father; Skin cancer in her brother. There is no history of Colon cancer. Allergies  Allergen Reactions  . Codeine Nausea And Vomiting  . Tequin     Severe stomach pain  . Penicillins Nausea Only and Rash        Review of Systems: Denies abdominal pain fever jaundice  The remainder of the 10 point ROS is negative except as outlined in H&P   Physical Exam: General appearance  Well developed, in no distress. Eyes- non icteric. HEENT nontraumatic, normocephalic. Mouth no lesions, tongue papillated, no cheilosis. Neck supple without adenopathy, thyroid not enlarged, no carotid bruits, no JVD. Lungs Clear to auscultation bilaterally. Cor normal S1, normal S2, regular rhythm, no murmur,  quiet precordium. Abdomen: Soft nontender Rectal: Not done Extremities no pedal edema. Skin no lesions. Neurological alert and oriented x 3. Psychological normal mood and affect.  Assessment and Plan:  2 white female with recent episode of acute pancreatitis of unknown etiology. Possibilities include autoimmune, biliary or, less likely,  alcohol related. She has concomitant thrombosis or portal vein and splenic veins and has a history of splenic infarctions 2 years ago. One wonders if there is a hypercoagulable state. She has collagenous colitis based on autoimmune process. She had collagenous colitis in 2004 and again in the October 2012. Her tissue  transglutaminase is negative but her sedimentation rate has been elevated to 28. We will recheck her amylase, lipase and sedimentation rate is today we will obtain upper abdominal ultrasound for followup of the pancreatic pseudocyst and of gallbladder in 3 months which would be in mid February 2015. She will remain on strict low-fat diet   09/26/2013 Lina Sar

## 2013-09-26 NOTE — Patient Instructions (Addendum)
Your physician has requested that you go to the basement for the following lab work before leaving today: Amylase, Lipase, Sed Rate  You have been scheduled for an abdominal ultrasound at [facility] (1st floor of hospital) on Wednesday 12/06/13 at 8:30 am. Please arrive 15 minutes prior to your appointment for registration. Make certain not to have anything to eat or drink 6 hours prior to your appointment. Should you need to reschedule your appointment, please contact radiology at [phone]. This test typically takes about 30 minutes to perform.  CC: Dr Lenord Fellers, Dr Darnelle Catalan

## 2013-09-27 LAB — LIPASE: Lipase: 513 U/L — ABNORMAL HIGH (ref 11.0–59.0)

## 2013-10-17 ENCOUNTER — Telehealth: Payer: Self-pay | Admitting: Internal Medicine

## 2013-10-17 DIAGNOSIS — K859 Acute pancreatitis without necrosis or infection, unspecified: Secondary | ICD-10-CM

## 2013-10-17 NOTE — Telephone Encounter (Signed)
Patient states she was bad over the weekend and ate things she should not(eggs benedict, grits, pecan pie, wine). Last night, she had abdominal pain and swelling. She has not had to use pain medication since last night. She is feeling better today but her husband made her call.

## 2013-10-17 NOTE — Telephone Encounter (Signed)
OK, she is for a procedure tomorrow.

## 2013-10-18 NOTE — Telephone Encounter (Signed)
Patient is allergic to Codeine( nausea and vomiting) Do you want to order some thing besides Tramadol?

## 2013-10-18 NOTE — Telephone Encounter (Signed)
Please obtain amylase, lipase, LFT's and CBC in follow up of pancreatitis. Usually Tramadol does not cross react with Codeine allergy. OK to fill.

## 2013-10-18 NOTE — Telephone Encounter (Signed)
Patient does not have [place] until 11/13/2013. She is doing better today swelling has gone down, less pain. She is worried about needing something for pain over the holidays. She has Demerol 4 tablets left that her PCP gave her. Please, advise.

## 2013-10-18 NOTE — Telephone Encounter (Signed)
Please send Tramadol 50mg , #20, 1 or 2 po q 4-6 hrs prn pain no refill

## 2013-10-19 MED ORDER — TRAMADOL HCL 50 MG PO TABS: ORAL_TABLET | ORAL | Status: DC

## 2013-10-19 NOTE — Telephone Encounter (Signed)
Spoke with patient and gave her Dr. Regino Schultze recommendations. Labs in EPIC.

## 2013-10-20 ENCOUNTER — Other Ambulatory Visit (INDEPENDENT_AMBULATORY_CARE_PROVIDER_SITE_OTHER): Payer: Medicare Other

## 2013-10-20 DIAGNOSIS — K859 Acute pancreatitis without necrosis or infection, unspecified: Secondary | ICD-10-CM

## 2013-10-20 LAB — HEPATIC FUNCTION PANEL
ALT: 23 U/L (ref 0–35)
AST: 23 U/L (ref 0–37)
Albumin: 3.6 g/dL (ref 3.5–5.2)
Alkaline Phosphatase: 55 U/L (ref 39–117)
Total Bilirubin: 0.6 mg/dL (ref 0.3–1.2)
Total Protein: 7.5 g/dL (ref 6.0–8.3)

## 2013-10-20 LAB — CBC WITH DIFFERENTIAL/PLATELET
Basophils Absolute: 0 10*3/uL (ref 0.0–0.1)
Basophils Relative: 0.5 % (ref 0.0–3.0)
Eosinophils Absolute: 0.3 10*3/uL (ref 0.0–0.7)
Eosinophils Relative: 2.6 % (ref 0.0–5.0)
HCT: 37.8 % (ref 36.0–46.0)
Lymphs Abs: 1.8 10*3/uL (ref 0.7–4.0)
Monocytes Relative: 8.5 % (ref 3.0–12.0)
Neutrophils Relative %: 70.7 % (ref 43.0–77.0)
Platelets: 448 10*3/uL — ABNORMAL HIGH (ref 150.0–400.0)
RDW: 12.9 % (ref 11.5–14.6)
WBC: 10.4 10*3/uL (ref 4.5–10.5)

## 2013-10-23 ENCOUNTER — Other Ambulatory Visit: Payer: Self-pay | Admitting: *Deleted

## 2013-10-23 ENCOUNTER — Telehealth: Payer: Self-pay | Admitting: *Deleted

## 2013-10-23 DIAGNOSIS — K859 Acute pancreatitis without necrosis or infection, unspecified: Secondary | ICD-10-CM

## 2013-10-23 NOTE — Telephone Encounter (Signed)
Spoke with patient to give her lab results. She states she feels like she is 6 months pregnant. States abdomen is swollen and she "feels awful." States she is not eating much because she is afraid too. Patient gave verbal update on what she is eating and she is following a low fat diet well. She states the pain medication makes her dizzy and she is asking if she can take Aleve. Patient is concerned because she is not feeling any better. Please, advise.

## 2013-10-23 NOTE — Telephone Encounter (Signed)
Spoke with patient's husband and scheduled her at 3:00 PM on 10/24/13. He will have patient call me(she is not available currently)

## 2013-10-23 NOTE — Telephone Encounter (Signed)
Please work her in with a health extender. May need CT scan to follow  Up on pancreatic cysts.

## 2013-10-24 ENCOUNTER — Ambulatory Visit (INDEPENDENT_AMBULATORY_CARE_PROVIDER_SITE_OTHER): Payer: Medicare Other | Admitting: Physician Assistant

## 2013-10-24 ENCOUNTER — Encounter: Payer: Self-pay | Admitting: Physician Assistant

## 2013-10-24 ENCOUNTER — Other Ambulatory Visit: Payer: Medicare Other

## 2013-10-24 VITALS — BP 130/70 | HR 60 | Ht 64.5 in | Wt 165.4 lb

## 2013-10-24 DIAGNOSIS — R141 Gas pain: Secondary | ICD-10-CM

## 2013-10-24 DIAGNOSIS — R140 Abdominal distension (gaseous): Secondary | ICD-10-CM

## 2013-10-24 DIAGNOSIS — K859 Acute pancreatitis without necrosis or infection, unspecified: Secondary | ICD-10-CM | POA: Insufficient documentation

## 2013-10-24 NOTE — Progress Notes (Signed)
Reviewed and agree.

## 2013-10-24 NOTE — Patient Instructions (Signed)
Please go to the basement level to have your labs drawn.  Take Omeprazole 20 mg daily. Amy Esterwood PA-C will talk to Dr. Lina Sar about a possible CT scan.  We will let you knowl

## 2013-10-24 NOTE — Progress Notes (Signed)
Subjective:    Patient ID: Annette Beltran, female    DOB: September 15, 1946, 67 y.o.   MRN: [id number]  HPI  Annette Beltran is a pleasant 67 year old white female known to Dr. Lina Beltran who has history of collagenous colitis previous history of breast cancer and history of a splenic infarct in 2012. She had hypercoagulable workup at that time which was negative. She was seen in October of 2014 with an acute episode of pancreatitis. She did not require hospitalization. CT scan of the abdomen and pelvis at that time showed an acute pancreatitis with slight prominence of the pancreatic duct and distal body and tail of the pancreas there was also a structure emanating from the tail of the pancreas possibly representing a pseudocyst she also had some concentric narrowing of the proximal transverse colon near the hepatic flexure possibly due to under distention. Subsequent MRI MRCP was done which showed pancreatic body and tail atrophy mild dilation of the pancreatic duct in the body of the pancreas and several fluid collections between the gastric fundus and splenic hilum consistent with early pseudocysts. She also had splenic vein thrombosis felt to be chronic She was seen back by Dr. Juanda Beltran in November, feeling better but some persistence of upper Donald discomfort. Her lipase had remained elevated for multiple weeks. This was checked on 1125 and was still elevated at 513. She had a lipase checked last week which was 76. She comes in today for followup stating that her abdominal discomfort has really never completely resolved. She has lost about 20 pounds but says she's been bile following a very strict low fat diet. She says she'll have good days and bad days and that her upper abdominal discomfort and bloating comments and goes. She gets some sharp pains in her epigastrium as well she has had some recent heartburn as well and indigestion. She has taken Ultram but this makes her a bit dizzy so has been substituted with  Aleve. Most days taking 3 or 4 Aleve. She's not currently on omeprazole or any other PPI. She says her stools state loose has not had any significant change in her bowel habits but says she usually does feel better after a bowel movement.    Review of Systems  Constitutional: Positive for appetite change.  HENT: Negative.   Eyes: Negative.   Respiratory: Negative.   Cardiovascular: Negative.   Gastrointestinal: Positive for abdominal pain, diarrhea and abdominal distention.  Endocrine: Negative.   Genitourinary: Negative.   Musculoskeletal: Negative.   Skin: Negative.   Allergic/Immunologic: Negative.   Neurological: Negative.   Hematological: Negative.   Psychiatric/Behavioral: Negative.    Outpatient Prescriptions Prior to Visit  Medication Sig Dispense Refill  . ALPRAZolam (XANAX) 0.5 MG tablet Take 1 tablet (0.5 mg total) by mouth 2 (two) times daily.  60 tablet  3  . Ascorbic Acid (VITAMIN C PO) Take 1 tablet by mouth 2 (two) times daily.      Marland Kitchen aspirin 81 MG tablet Take 81 mg by mouth daily.      Marland Kitchen b complex vitamins tablet Take 1 tablet by mouth daily.      . diphenhydrAMINE (BENADRYL) 25 MG tablet Take 25 mg by mouth every 6 (six) hours as needed for itching or allergies.      Marland Kitchen loratadine (CLARITIN) 10 MG tablet Take 10 mg by mouth daily.      . Multiple Vitamins-Minerals (PRESERVISION AREDS PO) Take by mouth.        . spironolactone (ALDACTONE)  100 MG tablet Take 1 tablet (100 mg total) by mouth daily.  90 tablet  3  . traMADol (ULTRAM) 50 MG tablet Take one or two po every 4-6 hours prn pain  20 tablet  0   No facility-administered medications prior to visit.   Allergies  Allergen Reactions  . Codeine Nausea And Vomiting  . Tequin     Severe stomach pain  . Penicillins Nausea Only and Rash   Patient Active Problem List   Diagnosis Date Noted  . Pancreatitis, acute 10/24/2013  . Recurrent serous otitis media 04/01/2012  . Grief reaction 02/05/2012  .  Osteopenia 01/03/2012  . Anxiety 07/30/2011  . 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 History of breast cancer 07/30/2011  . Collagenous colitis 06/21/2011    Class: History of  . RHINOSINUSITIS, CHRONIC 03/06/2010  . ALLERGIC RHINITIS 03/06/2010   History  Substance Use Topics  . Smoking status: Former Smoker -- 0.50 packs/day for 18 years    Types: Cigarettes    Quit date: 11/02/2000  . Smokeless tobacco: Never Used  . Alcohol Use: Yes     Comment: 1-2 alcohol drinks/day      family history includes Aneurysm in her mother; Heart failure in her paternal aunt; Pancreatic cancer in her maternal grandmother; Prostate cancer in her father; Skin cancer in her brother. There is no history of Colon cancer.  Objective:   Physical Exam Well-developed older white female in no acute distress, pleasant blood pressure 130/70 pulse 60 height 5 foot 4 weight 165. HEENT ;nontraumatic normocephalic EOMI PERRLA sclera anicteric, Supple; no JVD, Cardiovascular; regular rate and rhythm with S1-S2 no murmur or gallop, Pulmonary; clear bilaterally is, Abdomen; soft mildly tender across the upper abdomen there is no guarding or rebound no palpable mass or hepatosplenomegaly bowel sounds are present, Rectal; exam not done, Extremities; no clubbing cyanosis or edema skin warm dry and       Assessment & Plan:  #73  67 year old female with an episode of acute pancreatitis onset October 2014 she had a significant amount of inflammation and exudate and probably has small pseudocysts. She has had persistent upper abdominal discomfort which has not been severe Etiology of the pancreatitis is not clear this may be autoimmune, she also does consume some alcohol. #2 history of collagenous colitis which has been stable and not requiring any maintenance medication. Last colonoscopy was in 2004. Patient does feel that her abdominal pain improves after evacuating her bowels. We may need to consider a followup colonoscopy #3  GERD-probably being exacerbated by NSAIDs and pancreatitis #4 chronic splenic vein thrombosis #5 history of splenic infarct 2012  Plan; patient is caution to limit the use of Aleve Add omeprazole 20 mg by mouth every morning Low-fat diet Check ANA and IgG Patient is scheduled for an ultrasound in January and then has followup with Dr. Juanda Beltran in February. I feel that CT of the abdomen and pelvis should be repeated as well to followup on the extensive pancreatitis and pseudocysts. I will discuss with Dr. Juanda Beltran. Patient did not want to schedule now until after the holidays. When patient returns for followup also need to discuss followup colonoscopy.

## 2013-11-07 ENCOUNTER — Telehealth: Payer: Self-pay | Admitting: *Deleted

## 2013-11-07 ENCOUNTER — Other Ambulatory Visit: Payer: Self-pay | Admitting: *Deleted

## 2013-11-07 DIAGNOSIS — K859 Acute pancreatitis without necrosis or infection, unspecified: Secondary | ICD-10-CM

## 2013-11-07 NOTE — Telephone Encounter (Signed)
Spoke with Rose and scheduled for CT abd, pelvis with contrast on 11/09/13 at 9:00 AM. Contrast at 7 and 8 AM. NPO 4 hours prior. Left a message for patient to call me.

## 2013-11-07 NOTE — Telephone Encounter (Signed)
Spoke with patient and gave her appointment date, time and instructions for CT. She will come for labs also. She states she is feeling great now. No pain and she is not taking pain medication. The Tramadol made her have nausea and diarrhea. She is asking if she has to have the CT since she is much better. Per Nicoletta Ba, PA . May cancel CT. Patient notified.

## 2013-11-07 NOTE — Telephone Encounter (Signed)
Message copied by Hulan Saas on Tue Nov 07, 2013 10:42 AM ------      Message from: Nicoletta Ba S      Created: Mon Nov 06, 2013  5:00 PM       Yes, I would like her to have a CT abd/pelvis with Contrast- for follow up pancreatitis with pseudocyts      ----- Message -----         From: Hulan Saas, RN         Sent: 11/06/2013   2:28 PM           To: Amy S Esterwood, PA-C            Amy,      I have a note that you might order a CT on this patient. Do you want too do this?      Aisia Correira       ------

## 2013-11-09 ENCOUNTER — Other Ambulatory Visit: Payer: Medicare Other

## 2013-11-13 ENCOUNTER — Telehealth: Payer: Self-pay | Admitting: Internal Medicine

## 2013-11-13 ENCOUNTER — Other Ambulatory Visit (INDEPENDENT_AMBULATORY_CARE_PROVIDER_SITE_OTHER): Payer: Medicare HMO

## 2013-11-13 ENCOUNTER — Ambulatory Visit (HOSPITAL_COMMUNITY)
Admission: RE | Admit: 2013-11-13 | Discharge: 2013-11-13 | Disposition: A | Discharge status: Home / Self Care | Payer: Medicare HMO | Source: Ambulatory Visit | Attending: Internal Medicine | Admitting: Internal Medicine

## 2013-11-13 DIAGNOSIS — R109 Unspecified abdominal pain: Secondary | ICD-10-CM

## 2013-11-13 DIAGNOSIS — K863 Pseudocyst of pancreas: Secondary | ICD-10-CM

## 2013-11-13 DIAGNOSIS — D7389 Other diseases of spleen: Secondary | ICD-10-CM | POA: Insufficient documentation

## 2013-11-13 DIAGNOSIS — R932 Abnormal findings on diagnostic imaging of liver and biliary tract: Secondary | ICD-10-CM | POA: Insufficient documentation

## 2013-11-13 DIAGNOSIS — K859 Acute pancreatitis without necrosis or infection, unspecified: Secondary | ICD-10-CM

## 2013-11-13 DIAGNOSIS — K862 Cyst of pancreas: Secondary | ICD-10-CM

## 2013-11-13 DIAGNOSIS — K8020 Calculus of gallbladder without cholecystitis without obstruction: Secondary | ICD-10-CM | POA: Insufficient documentation

## 2013-11-13 DIAGNOSIS — Z8719 Personal history of other diseases of the digestive system: Secondary | ICD-10-CM | POA: Insufficient documentation

## 2013-11-13 LAB — COMPREHENSIVE METABOLIC PANEL
ALBUMIN: 3.7 g/dL (ref 3.5–5.2)
ALT: 23 U/L (ref 0–35)
AST: 22 U/L (ref 0–37)
Alkaline Phosphatase: 60 U/L (ref 39–117)
BUN: 14 mg/dL (ref 6–23)
CALCIUM: 9.6 mg/dL (ref 8.4–10.5)
CO2: 28 meq/L (ref 19–32)
CREATININE: 0.9 mg/dL (ref 0.4–1.2)
Chloride: 102 mEq/L (ref 96–112)
GFR: 69.35 mL/min (ref >60.00)
Glucose, Bld: 108 mg/dL — ABNORMAL HIGH (ref 70–99)
POTASSIUM: 4.3 meq/L (ref 3.5–5.1)
Sodium: 138 mEq/L (ref 135–145)
Total Bilirubin: 0.5 mg/dL (ref 0.3–1.2)
Total Protein: 7.4 g/dL (ref 6.0–8.3)

## 2013-11-13 LAB — CBC WITH DIFFERENTIAL/PLATELET
BASOS ABS: 0.1 10*3/uL (ref 0.0–0.1)
Basophils Relative: 1 % (ref 0.0–3.0)
Eosinophils Absolute: 0.4 10*3/uL (ref 0.0–0.7)
Eosinophils Relative: 3.2 % (ref 0.0–5.0)
HEMATOCRIT: 38.7 % (ref 36.0–46.0)
Hemoglobin: 13.5 g/dL (ref 12.0–15.0)
LYMPHS PCT: 14.7 % (ref 12.0–46.0)
Lymphs Abs: 1.8 10*3/uL (ref 0.7–4.0)
MCHC: 34.9 g/dL (ref 30.0–36.0)
MCV: 85.8 fl (ref 78.0–100.0)
MONO ABS: 0.7 10*3/uL (ref 0.1–1.0)
Monocytes Relative: 5.3 % (ref 3.0–12.0)
Neutro Abs: 9.5 10*3/uL — ABNORMAL HIGH (ref 1.4–7.7)
Neutrophils Relative %: 75.8 % (ref 43.0–77.0)
PLATELETS: 395 10*3/uL (ref 150.0–400.0)
RBC: 4.52 Mil/uL (ref 3.87–5.11)
RDW: 13.4 % (ref 11.5–14.6)
WBC: 12.5 10*3/uL — ABNORMAL HIGH (ref 4.5–10.5)

## 2013-11-13 LAB — AMYLASE: AMYLASE: 110 U/L (ref 27–131)

## 2013-11-13 LAB — LIPASE
Lipase: 96 U/L — ABNORMAL HIGH (ref 11.0–59.0)
Lipase: 98 U/L — ABNORMAL HIGH (ref 11.0–59.0)

## 2013-11-13 MED ORDER — MEPERIDINE HCL 50 MG PO TABS: ORAL_TABLET | ORAL | Status: DC

## 2013-11-13 NOTE — Telephone Encounter (Signed)
Per Nicoletta Ba, PA,  Have patient continue pain medications she is taking. Patient states she is taking Demerol not Darvon. She only has one pill left.Spoke with Rose and scheduled CT on 11/14/13 at 2:00 PM at [place] 4 hours prior. Contrast 2 and 1 hour prior. Per Nicoletta Ba, PA , Demerol 50 mg #15 tablets. Take 1/2 tablet every 6 hour prn pain. Patient states her insurance has changed to [place] from [place]. She will bring a new card today when she comes for labs.

## 2013-11-13 NOTE — Telephone Encounter (Signed)
Spoke with patient and she had her ultrasound this AM. She had been doing great until this morning at 5:15 AM. She woke up with pain. She took a Darvon she had on hand.(tramadol made her sick in past). It has helped ease the pain but it is still there. Please, advise.

## 2013-11-13 NOTE — Telephone Encounter (Signed)
Patient is allergic to Codeine. Receiving a warning for Vicodin. Please, advise.

## 2013-11-13 NOTE — Telephone Encounter (Signed)
Ok- US shows gallstones and a probable pseudocyst in tail of pancreas. I think she needs to have a follow up CT abd/pelvis to further evaluate pancreas and pseudocyts.  Should eat clears to day , and get labs- lipase, cmet,cbc. Offer her vicoden if she needs something else for pain

## 2013-11-14 ENCOUNTER — Other Ambulatory Visit: Payer: Self-pay | Admitting: Physician Assistant

## 2013-11-14 ENCOUNTER — Ambulatory Visit (HOSPITAL_COMMUNITY)
Admission: RE | Admit: 2013-11-14 | Discharge: 2013-11-14 | Disposition: A | Discharge status: Home / Self Care | Payer: Medicare HMO | Source: Ambulatory Visit | Attending: Physician Assistant | Admitting: Physician Assistant

## 2013-11-14 ENCOUNTER — Telehealth: Payer: Self-pay | Admitting: Internal Medicine

## 2013-11-14 ENCOUNTER — Ambulatory Visit
Admission: RE | Admit: 2013-11-14 | Discharge: 2013-11-14 | Disposition: A | Discharge status: Home / Self Care | Payer: Medicare HMO | Source: Ambulatory Visit | Attending: Physician Assistant | Admitting: Physician Assistant

## 2013-11-14 DIAGNOSIS — K863 Pseudocyst of pancreas: Secondary | ICD-10-CM

## 2013-11-14 DIAGNOSIS — K859 Acute pancreatitis without necrosis or infection, unspecified: Secondary | ICD-10-CM | POA: Insufficient documentation

## 2013-11-14 DIAGNOSIS — R109 Unspecified abdominal pain: Secondary | ICD-10-CM

## 2013-11-14 DIAGNOSIS — K862 Cyst of pancreas: Secondary | ICD-10-CM

## 2013-11-14 DIAGNOSIS — K8020 Calculus of gallbladder without cholecystitis without obstruction: Secondary | ICD-10-CM | POA: Insufficient documentation

## 2013-11-14 MED ORDER — IOHEXOL 300 MG/ML  SOLN: 80.0000 mL | Freq: Once | INTRAMUSCULAR | Status: AC | PRN

## 2013-11-14 MED ORDER — IOHEXOL 300 MG/ML  SOLN
80.0000 mL | Freq: Once | INTRAMUSCULAR | Status: AC | PRN
  Administered 2013-11-14: 80 mL via INTRAVENOUS

## 2013-11-14 NOTE — Telephone Encounter (Signed)
Spoke with patient and she states she is eating congealed salad with celery. Encouraged patient to eat bland foods only. She states she "will try."

## 2013-11-15 ENCOUNTER — Ambulatory Visit (INDEPENDENT_AMBULATORY_CARE_PROVIDER_SITE_OTHER): Payer: Medicare HMO | Admitting: Internal Medicine

## 2013-11-15 ENCOUNTER — Encounter: Payer: Self-pay | Admitting: Internal Medicine

## 2013-11-15 VITALS — BP 110/70 | HR 68 | Ht 64.5 in | Wt 159.0 lb

## 2013-11-15 DIAGNOSIS — K8020 Calculus of gallbladder without cholecystitis without obstruction: Secondary | ICD-10-CM

## 2013-11-15 DIAGNOSIS — K859 Acute pancreatitis without necrosis or infection, unspecified: Secondary | ICD-10-CM

## 2013-11-15 DIAGNOSIS — K862 Cyst of pancreas: Secondary | ICD-10-CM

## 2013-11-15 DIAGNOSIS — K863 Pseudocyst of pancreas: Secondary | ICD-10-CM

## 2013-11-15 MED ORDER — OMEPRAZOLE 20 MG PO CPDR: 20.0000 mg | DELAYED_RELEASE_CAPSULE | Freq: Every day | ORAL | Status: DC

## 2013-11-15 NOTE — Progress Notes (Signed)
Annette Beltran 02-09-1946 [id number]  Note: This dictation was prepared with Dragon digital system. Any transcriptional errors that result from this procedure are unintentional.   History of Present Illness:  This is a 68 year old white female with a known history of breast cancer, collagenous colitis since 2004 and hospitalization for splenic infarcts in 2012 with a recent onset of acute pancreatitis starting in October 2014 presenting with acute abdominal pain which subsided after several days. A CT scan and MRCP showed a dilated main pancreatic duct. There was no mass. There were several pseudocyst and there was some stranding at the splenic vein with partial thrombosis of the splenic vein and portal vein. Her amylase and lipase has been elevated. She has had about 3 glasses of wine since October 2014 but has no significant alcohol intake history. She has a positive family history of gallbladder disease and presently has multiple gallstones as per an upper abdominal ultrasound. She was evaluated for hypocoagulable state in 2012 and it was negative. Her most recent CT scan of the abdomen showed a 4x5 cm pancreatic pseudocyst with mildly dilated main pancreatic duct and gallstones. She had an aortogram in August 2012 which showed a short segment of the celiac artery with 70% stenosis and a long segment narrowing of the common hepatic and splenic arteries with post stenotic dilatation.possibly a results of prior inflammatory process, possibly pancreatitis. She is currently not having any abdominal pain. She had a brief attack of abdominal pain last week which woke her up at 5 in the morning. She denies fever. She has continued to lose weight from 176 to a current 154 pounds.    Past Medical History  Diagnosis Date  . Breast cancer   . Macular degeneration   . Fluid retention   . Colitis, collagenous   . Vitamin D deficiency   . Osteopenia   . Celiac artery stenosis   . Splenic infarction   .  Arthritis   . History of blood clots   . Anxiety   . Arrhythmia   . Shingles   . Pancreatitis     Past Surgical History  Procedure Laterality Date  . Foot surgery  2003    x 3, 2 on right, 1 on left  . Breast lumpectomy Right     radiation for 6 weeks  . Meniscus repair Right     Allergies  Allergen Reactions  . Codeine Nausea And Vomiting  . Tequin     Severe stomach pain  . Penicillins Nausea Only and Rash    Family history and social history have been reviewed.  Review of Systems: Negative for abdominal pain. Positive for diarrhea. Negative for rectal bleeding  The remainder of the 10 point ROS is negative except as outlined in the H&P  Physical Exam: General Appearance Well developed, in no distress Eyes  Non icteric  HEENT  Non traumatic, normocephalic  Mouth No lesion, tongue papillated, no cheilosis Neck Supple without adenopathy, thyroid not enlarged, no carotid bruits, no JVD Lungs Clear to auscultation bilaterally COR Normal S1, normal S2, regular rhythm, no murmur, quiet precordium Abdomen minimal tenderness in left upper quadrant. No fullness or pulsations Rectal not done Extremities  No pedal edema Skin No lesions Neurological Alert and oriented x 3 Psychological Normal mood and affect  Assessment and Plan:   Problem #1 known cholelithiasis with an otherwise normal-appearing gallbladder. She has acute pancreatitis with development of a pancreatic pseudocyst occupying the gastrosplenic space measuring 4x5 cm. She has a mildly  dilated main pancreatic duct consistent with chronic pancreatitis. The pancreatitis may be biliary in origin or possibly autoimmune. She has t collagenous colitis. She has also splenic vein and portal vein thrombosis which raises a question of a chronic inflammatory process in the past affecting the portal vein. She will likely need a cholecystectomy but before we refer her, I would like to do an EUS to define her pancreas and the  pancreatic pseudocyst to r/o PMNeoplasm. I have instructed her on a strict low fat diet and absolutely no alcohol. She does not want her collagenous colitis to be treated. She was on Entocort in the past.but prefers to have frequent stools.    Delfin Edis 11/15/2013

## 2013-11-15 NOTE — Patient Instructions (Addendum)
You will be scheduled for an endoscopic ultrasound with Dr Owens Loffler. We will be in touch with you on that appointment once we have discussed your case with Dr Ardis Hughs.   We have sent the following medications to your pharmacy for you to pick up at your convenience: Prilosec 20 mg daily  Please follow a low fat diet.  CC: Dr Merrilee Jansky. [place], Dr Owens Loffler  Fat and Cholesterol Control Diet Fat and cholesterol levels in your blood and organs are influenced by your diet. High levels of fat and cholesterol may lead to diseases of the heart, small and large blood vessels, gallbladder, liver, and pancreas. CONTROLLING FAT AND CHOLESTEROL WITH DIET Although exercise and lifestyle factors are important, your diet is key. That is because certain foods are known to raise cholesterol and others to lower it. The goal is to balance foods for their effect on cholesterol and more importantly, to replace saturated and trans fat with other types of fat, such as monounsaturated fat, polyunsaturated fat, and omega-3 fatty acids. On average, a person should consume no more than 15 to 17 g of saturated fat daily. Saturated and trans fats are considered "bad" fats, and they will raise LDL cholesterol. Saturated fats are primarily found in animal products such as meats, butter, and cream. However, that does not mean you need to give up all your favorite foods. Today, there are good tasting, low-fat, low-cholesterol substitutes for most of the things you like to eat. Choose low-fat or nonfat alternatives. Choose round or loin cuts of red meat. These types of cuts are lowest in fat and cholesterol. Chicken (without the skin), fish, veal, and ground [place] breast are great choices. Eliminate fatty meats, such as hot dogs and salami. Even shellfish have little or no saturated fat. Have a 3 oz (85 g) portion when you eat lean meat, poultry, or fish. Trans fats are also called "partially hydrogenated oils." They are oils that  have been scientifically manipulated so that they are solid at room temperature resulting in a longer shelf life and improved taste and texture of foods in which they are added. Trans fats are found in stick margarine, some tub margarines, cookies, crackers, and baked goods.  When baking and cooking, oils are a great substitute for butter. The monounsaturated oils are especially beneficial since it is believed they lower LDL and raise HDL. The oils you should avoid entirely are saturated tropical oils, such as coconut and palm.  Remember to eat a lot from food groups that are naturally free of saturated and trans fat, including fish, fruit, vegetables, beans, grains (barley, rice, couscous, bulgur wheat), and pasta (without cream sauces).  IDENTIFYING FOODS THAT LOWER FAT AND CHOLESTEROL  Soluble fiber may lower your cholesterol. This type of fiber is found in fruits such as apples, vegetables such as broccoli, potatoes, and carrots, legumes such as beans, peas, and lentils, and grains such as barley. Foods fortified with plant sterols (phytosterol) may also lower cholesterol. You should eat at least 2 g per day of these foods for a cholesterol lowering effect.  Read package labels to identify low-saturated fats, trans fat free, and low-fat foods at the supermarket. Select cheeses that have only 2 to 3 g saturated fat per ounce. Use a heart-healthy tub margarine that is free of trans fats or partially hydrogenated oil. When buying baked goods (cookies, crackers), avoid partially hydrogenated oils. Breads and muffins should be made from whole grains (whole-wheat or whole oat flour, instead of "  flour" or "enriched flour"). Buy non-creamy canned soups with reduced salt and no added fats.  FOOD PREPARATION TECHNIQUES  Never deep-fry. If you must fry, either stir-fry, which uses very little fat, or use non-stick cooking sprays. When possible, broil, bake, or roast meats, and steam vegetables. Instead of putting  butter or margarine on vegetables, use lemon and herbs, applesauce, and cinnamon (for squash and sweet potatoes). Use nonfat yogurt, salsa, and low-fat dressings for salads.  LOW-SATURATED FAT / LOW-FAT FOOD SUBSTITUTES Meats / Saturated Fat (g)  Avoid: Steak, marbled (3 oz/85 g) / 11 g  Choose: Steak, lean (3 oz/85 g) / 4 g  Avoid: Hamburger (3 oz/85 g) / 7 g  Choose: Hamburger, lean (3 oz/85 g) / 5 g  Avoid: Ham (3 oz/85 g) / 6 g  Choose: Ham, lean cut (3 oz/85 g) / 2.4 g  Avoid: Chicken, with skin, dark meat (3 oz/85 g) / 4 g  Choose: Chicken, skin removed, dark meat (3 oz/85 g) / 2 g  Avoid: Chicken, with skin, light meat (3 oz/85 g) / 2.5 g  Choose: Chicken, skin removed, light meat (3 oz/85 g) / 1 g Dairy / Saturated Fat (g)  Avoid: Whole milk (1 cup) / 5 g  Choose: Low-fat milk, 2% (1 cup) / 3 g  Choose: Low-fat milk, 1% (1 cup) / 1.5 g  Choose: Skim milk (1 cup) / 0.3 g  Avoid: Hard cheese (1 oz/28 g) / 6 g  Choose: Skim milk cheese (1 oz/28 g) / 2 to 3 g  Avoid: Cottage cheese, 4% fat (1 cup) / 6.5 g  Choose: Low-fat cottage cheese, 1% fat (1 cup) / 1.5 g  Avoid: Ice cream (1 cup) / 9 g  Choose: Sherbet (1 cup) / 2.5 g  Choose: Nonfat frozen yogurt (1 cup) / 0.3 g  Choose: Frozen fruit bar / trace  Avoid: Whipped cream (1 tbs) / 3.5 g  Choose: Nondairy whipped topping (1 tbs) / 1 g Condiments / Saturated Fat (g)  Avoid: Mayonnaise (1 tbs) / 2 g  Choose: Low-fat mayonnaise (1 tbs) / 1 g  Avoid: Butter (1 tbs) / 7 g  Choose: Extra light margarine (1 tbs) / 1 g  Avoid: Coconut oil (1 tbs) / 11.8 g  Choose: Olive oil (1 tbs) / 1.8 g  Choose: Corn oil (1 tbs) / 1.7 g  Choose: Safflower oil (1 tbs) / 1.2 g  Choose: Sunflower oil (1 tbs) / 1.4 g  Choose: Soybean oil (1 tbs) / 2.4 g  Choose: Canola oil (1 tbs) / 1 g Document Released: 10/19/2005 Document Revised: 02/13/2013 Document Reviewed: 04/09/2011 Exit[employer] Patient Information 2014  [place], [place].  Dr Renold Genta, Dr Jana Hakim, Dr Ardis Hughs, Sanda Linger

## 2013-11-17 ENCOUNTER — Telehealth: Payer: Self-pay

## 2013-11-17 ENCOUNTER — Other Ambulatory Visit: Payer: Self-pay

## 2013-11-17 ENCOUNTER — Telehealth: Payer: Self-pay | Admitting: Internal Medicine

## 2013-11-17 ENCOUNTER — Other Ambulatory Visit: Payer: Self-pay | Admitting: Internal Medicine

## 2013-11-17 DIAGNOSIS — R933 Abnormal findings on diagnostic imaging of other parts of digestive tract: Secondary | ICD-10-CM

## 2013-11-17 NOTE — Telephone Encounter (Signed)
Patient states she has been on Spironolactone since she was 68 years old. She called Dr. Renold Genta to get it refilled and she was told they would not refill because it can cause pancreatitis. Wants to know if she should not be on it. Please, advise.

## 2013-11-17 NOTE — Telephone Encounter (Signed)
Annette Beltran, She needs upper EUS, radial +/- linear for abnormal pancreas, pancreatic cyst, dilated pancreatic duct. 60 min, ++ MAC, next avail EUS spot [name], We'll get her scheduled.

## 2013-11-17 NOTE — Telephone Encounter (Signed)
She should stop taking it 

## 2013-11-17 NOTE — Telephone Encounter (Signed)
Drugs can cause  Pancreatitis. Would like her to stay off this for now.

## 2013-11-17 NOTE — Telephone Encounter (Signed)
Pt has been scheduled for EUS on 11/23/13 830 am EUS scheduled, pt instructed and medications reviewed.  Patient instructions mailed to home.  Patient to call with any questions or concerns.

## 2013-11-20 ENCOUNTER — Encounter (HOSPITAL_COMMUNITY): Payer: Self-pay | Admitting: *Deleted

## 2013-11-20 ENCOUNTER — Encounter (HOSPITAL_COMMUNITY): Payer: Self-pay | Admitting: Pharmacy Technician

## 2013-11-20 NOTE — Telephone Encounter (Signed)
Spoke with patient and gave her Dr. Nichola Sizer recommendation. Patient states her MyChart has expired and she needs a new number

## 2013-11-20 NOTE — Telephone Encounter (Signed)
Left a message that I have a new activation code for her.

## 2013-11-21 NOTE — Telephone Encounter (Signed)
Spoke with patient and gave her the number activation code.

## 2013-11-23 ENCOUNTER — Other Ambulatory Visit: Payer: Self-pay | Admitting: *Deleted

## 2013-11-23 ENCOUNTER — Telehealth: Payer: Self-pay | Admitting: Internal Medicine

## 2013-11-23 ENCOUNTER — Telehealth: Payer: Self-pay | Admitting: *Deleted

## 2013-11-23 ENCOUNTER — Ambulatory Visit (HOSPITAL_COMMUNITY): Payer: Medicare HMO | Admitting: Certified Registered"

## 2013-11-23 ENCOUNTER — Encounter (HOSPITAL_COMMUNITY): Payer: Medicare HMO | Admitting: Certified Registered"

## 2013-11-23 ENCOUNTER — Ambulatory Visit (HOSPITAL_COMMUNITY)
Admission: RE | Admit: 2013-11-23 | Discharge: 2013-11-23 | Disposition: A | Discharge status: Home / Self Care | Payer: Medicare HMO | Source: Ambulatory Visit | Attending: Gastroenterology | Admitting: Gastroenterology

## 2013-11-23 ENCOUNTER — Telehealth: Payer: Self-pay | Admitting: Oncology

## 2013-11-23 ENCOUNTER — Encounter (HOSPITAL_COMMUNITY)
Admission: RE | Disposition: A | Discharge status: Home / Self Care | Payer: Self-pay | Source: Ambulatory Visit | Attending: Gastroenterology

## 2013-11-23 ENCOUNTER — Encounter (HOSPITAL_COMMUNITY): Payer: Self-pay | Admitting: *Deleted

## 2013-11-23 DIAGNOSIS — C251 Malignant neoplasm of body of pancreas: Secondary | ICD-10-CM

## 2013-11-23 DIAGNOSIS — I774 Celiac artery compression syndrome: Secondary | ICD-10-CM | POA: Insufficient documentation

## 2013-11-23 DIAGNOSIS — K862 Cyst of pancreas: Secondary | ICD-10-CM | POA: Insufficient documentation

## 2013-11-23 DIAGNOSIS — K8689 Other specified diseases of pancreas: Secondary | ICD-10-CM

## 2013-11-23 DIAGNOSIS — R933 Abnormal findings on diagnostic imaging of other parts of digestive tract: Secondary | ICD-10-CM

## 2013-11-23 DIAGNOSIS — K859 Acute pancreatitis without necrosis or infection, unspecified: Secondary | ICD-10-CM | POA: Insufficient documentation

## 2013-11-23 DIAGNOSIS — K5289 Other specified noninfective gastroenteritis and colitis: Secondary | ICD-10-CM | POA: Insufficient documentation

## 2013-11-23 DIAGNOSIS — K869 Disease of pancreas, unspecified: Secondary | ICD-10-CM

## 2013-11-23 DIAGNOSIS — I81 Portal vein thrombosis: Secondary | ICD-10-CM | POA: Insufficient documentation

## 2013-11-23 DIAGNOSIS — K8020 Calculus of gallbladder without cholecystitis without obstruction: Secondary | ICD-10-CM | POA: Insufficient documentation

## 2013-11-23 DIAGNOSIS — D7389 Other diseases of spleen: Secondary | ICD-10-CM | POA: Insufficient documentation

## 2013-11-23 DIAGNOSIS — Z853 Personal history of malignant neoplasm of breast: Secondary | ICD-10-CM | POA: Insufficient documentation

## 2013-11-23 DIAGNOSIS — K863 Pseudocyst of pancreas: Secondary | ICD-10-CM

## 2013-11-23 HISTORY — PX: EUS: SHX5427

## 2013-11-23 SURGERY — UPPER ENDOSCOPIC ULTRASOUND (EUS) LINEAR
Anesthesia: Monitor Anesthesia Care

## 2013-11-23 MED ORDER — CIPROFLOXACIN IN D5W 400 MG/200ML IV SOLN
INTRAVENOUS | Status: DC | PRN
  Administered 2013-11-23: 400 mg via INTRAVENOUS

## 2013-11-23 MED ORDER — PROPOFOL 10 MG/ML IV BOLUS
INTRAVENOUS | Status: AC
  Filled 2013-11-23: qty 20

## 2013-11-23 MED ORDER — SODIUM CHLORIDE 0.9 % IV SOLN: INTRAVENOUS | Status: DC

## 2013-11-23 MED ORDER — CIPROFLOXACIN HCL 500 MG PO TABS: 500.0000 mg | ORAL_TABLET | Freq: Two times a day (BID) | ORAL | Status: DC

## 2013-11-23 MED ORDER — LIDOCAINE HCL (CARDIAC) 20 MG/ML IV SOLN
INTRAVENOUS | Status: AC
  Filled 2013-11-23: qty 5

## 2013-11-23 MED ORDER — PROPOFOL INFUSION 10 MG/ML OPTIME
INTRAVENOUS | Status: DC | PRN
  Administered 2013-11-23: 75 ug/kg/min via INTRAVENOUS

## 2013-11-23 MED ORDER — LACTATED RINGERS IV SOLN
INTRAVENOUS | Status: DC
  Administered 2013-11-23: 1000 mL via INTRAVENOUS

## 2013-11-23 MED ORDER — CIPROFLOXACIN IN D5W 400 MG/200ML IV SOLN
INTRAVENOUS | Status: AC
  Filled 2013-11-23: qty 200

## 2013-11-23 MED ORDER — LIDOCAINE HCL (CARDIAC) 20 MG/ML IV SOLN
INTRAVENOUS | Status: DC | PRN
  Administered 2013-11-23: 50 mg via INTRAVENOUS

## 2013-11-23 NOTE — Telephone Encounter (Signed)
Low fat diet, no alsohol, tell her I have spoken to Dr Ardis Hughs

## 2013-11-23 NOTE — Progress Notes (Signed)
Pt wants to see Dr. Fredric Dine with [place]

## 2013-11-23 NOTE — Discharge Instructions (Signed)

## 2013-11-23 NOTE — Telephone Encounter (Signed)
s.w. pt adn advised on Jan appt .Marland KitchenMarland Kitchenpt ok and aware

## 2013-11-23 NOTE — Op Note (Signed)
[facility] [place], [address]   ENDOSCOPIC ULTRASOUND PROCEDURE REPORT  PATIENT: Annette, Beltran  MR#: [id number] BIRTHDATE: [birth year]  GENDER: Female ENDOSCOPIST: Milus Banister, MD REFERRED BY:  Lafayette Dragon, M.D. 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  Since then imaging has shown increasing cystic fluid collection in/around tail of pancreas; ++ weight loss 20 pounds since October 2014. MEDICATIONS: MAC sedation, administered by CRNA and Cipro 400 mg IV   DESCRIPTION OF PROCEDURE:   After the risks benefits and alternatives of the procedure were  explained, informed consent was obtained. The patient was then placed in the left, lateral, decubitus postion and IV sedation was administered. Throughout the procedure, the patients blood pressure, pulse and oxygen saturations were monitored continuously.  Under direct visualization, the [id number]  endoscope was introduced through the mouth  and advanced to the descending duodenum .  Water was used as necessary to provide an acoustic interface.  Upon completion of the imaging, water was removed and the patient was sent to the recovery room in satisfactory condition.   Endoscopic findings: 1. Non-obstructing Schatzki's type ring at GE junction otherwise UGI tract was normal  EUS findings: 1. Irregular pancreas throught the gland except for fairly normal appearing head, neck of parenchyma.  There was a large pancreatic/peripancreatid fluid collection at the tail of pancreas (measuring at least 5cm). This was nearly completely aspirated using a single transgastric pass of a 22 guage EUS FNA needle.  This yielded 25cc of chocolate colored fluid which was all sent for cytology. 2. The main pancreatic duct was normal in head, neck and then there was a transition to irregular (ectactic and somewhat dilated) main duct in the region of body, neck.  At this location there was very subtle 2.5cm mass like soft tissue that was more hypoechoic than surrounding parenchyma. This mass directly abuts the splenic vein but no other major vessels. The mass was sampled with two transgastric passes with a 25 guage EUS FNA needle. 3. There 2-3 scattered, round, hyopechoic lymphnodes near the pancreatic tail fluid collection, not sampled. 4. CBD was normal, non-dilated. 5. Gallbladder contained stones  Impression: 2.5cm subtle mass in body/neck of pancreas which is causing obstruction, dilation of the main pancreatid duct. The mass directly abuts the splenic vein but no other major vessels. Preliminary cytology review from the mass is positive for malignancy (adenocarcinoma), awaiting final reading. The fluid collection in region of tail of pancreas is probably result of main PD obstruction caused by the mass. The fluid was aspirated and sent for cytology, preliminary reading showed old blood, no clear neoplastic cells. She will complete 3 days of twice daily cipro. My office will begin referrals to Dr. Benay Spice, Barry Dienes.  I discussed findings with Dr. Olevia Perches today.   _______________________________ eSigned:  Milus Banister, MD 11/23/2013 10:16 AM   PATIENT NAME:  Annette, Beltran MR#: [id number]

## 2013-11-23 NOTE — Telephone Encounter (Signed)
Spoke with patient by phone and gave appointment with Dr. Jana Hakim for 11/30/13.  She requested appointment with dietician ASAP.  Referral placed and appointment given.  She was appreciative of the call.

## 2013-11-23 NOTE — Anesthesia Preprocedure Evaluation (Addendum)
Anesthesia Evaluation  Patient identified by MRN, date of birth, ID band Patient awake    Reviewed: Allergy & Precautions, H&P , NPO status , Patient's Chart, lab work & pertinent test results  Airway Mallampati: II TM Distance: >3 FB Neck ROM: Full    Dental no notable dental hx.    Pulmonary neg pulmonary ROS, former smoker,  breath sounds clear to auscultation  Pulmonary exam normal       Cardiovascular negative cardio ROS  Rhythm:Regular Rate:Normal     Neuro/Psych negative neurological ROS  negative psych ROS   GI/Hepatic negative GI ROS, Neg liver ROS,   Endo/Other  negative endocrine ROS  Renal/GU negative Renal ROS  negative genitourinary   Musculoskeletal negative musculoskeletal ROS (+)   Abdominal   Peds negative pediatric ROS (+)  Hematology negative hematology ROS (+)   Anesthesia Other Findings Two temporary crowns upper back left  Reproductive/Obstetrics negative OB ROS                          Anesthesia Physical Anesthesia Plan  ASA: II  Anesthesia Plan: MAC   Post-op Pain Management:    Induction:   Airway Management Planned:   Additional Equipment:   Intra-op Plan:   Post-operative Plan:   Informed Consent: I have reviewed the patients History and Physical, chart, labs and discussed the procedure including the risks, benefits and alternatives for the proposed anesthesia with the patient or authorized representative who has indicated his/her understanding and acceptance.   Dental advisory given  Plan Discussed with: CRNA  Anesthesia Plan Comments:         Anesthesia Quick Evaluation

## 2013-11-23 NOTE — Transfer of Care (Signed)
Immediate Anesthesia Transfer of Care Note  Patient: Annette Beltran  Procedure(s) Performed: Procedure(s): UPPER ENDOSCOPIC ULTRASOUND (EUS) LINEAR (N/A)  Patient Location: PACU  Anesthesia Type:MAC  Level of Consciousness: awake, alert  and oriented  Airway & Oxygen Therapy: Patient Spontanous Breathing and Patient connected to nasal cannula oxygen  Post-op Assessment: Report given to PACU RN and Post -op Vital signs reviewed and stable  Post vital signs: Reviewed and stable  Complications: No apparent anesthesia complications

## 2013-11-23 NOTE — Telephone Encounter (Signed)
Patient wants to know if she needs to stay on low fat diet. Please, advise.

## 2013-11-23 NOTE — Interval H&P Note (Signed)
History and Physical Interval Note:  11/23/2013 8:42 AM  Annette Beltran  has presented today for surgery, with the diagnosis of Nonspecific (abnormal) findings on radiological and other examination of gastrointestinal tract [793.4]  The various methods of treatment have been discussed with the patient and family. After consideration of risks, benefits and other options for treatment, the patient has consented to  Procedure(s): UPPER ENDOSCOPIC ULTRASOUND (EUS) LINEAR (N/A) as a surgical intervention .  The patient's history has been reviewed, patient examined, no change in status, stable for surgery.  I have reviewed the patient's chart and labs.  Questions were answered to the patient's satisfaction.     Milus Banister

## 2013-11-23 NOTE — H&P (View-Only) (Signed)
Annette Beltran 11/16/1945 [id number]  Note: This dictation was prepared with Dragon digital system. Any transcriptional errors that result from this procedure are unintentional.   History of Present Illness:  This is a 67-year-old white female with a known history of breast cancer, collagenous colitis since 2004 and hospitalization for splenic infarcts in 2012 with a recent onset of acute pancreatitis starting in October 2014 presenting with acute abdominal pain which subsided after several days. A CT scan and MRCP showed a dilated main pancreatic duct. There was no mass. There were several pseudocyst and there was some stranding at the splenic vein with partial thrombosis of the splenic vein and portal vein. Her amylase and lipase has been elevated. She has had about 3 glasses of wine since October 2014 but has no significant alcohol intake history. She has a positive family history of gallbladder disease and presently has multiple gallstones as per an upper abdominal ultrasound. She was evaluated for hypocoagulable state in 2012 and it was negative. Her most recent CT scan of the abdomen showed a 4x5 cm pancreatic pseudocyst with mildly dilated main pancreatic duct and gallstones. She had an aortogram in August 2012 which showed a short segment of the celiac artery with 70% stenosis and a long segment narrowing of the common hepatic and splenic arteries with post stenotic dilatation.possibly a results of prior inflammatory process, possibly pancreatitis. She is currently not having any abdominal pain. She had a brief attack of abdominal pain last week which woke her up at 5 in the morning. She denies fever. She has continued to lose weight from 176 to a current 154 pounds.    Past Medical History  Diagnosis Date  . Breast cancer   . Macular degeneration   . Fluid retention   . Colitis, collagenous   . Vitamin D deficiency   . Osteopenia   . Celiac artery stenosis   . Splenic infarction   .  Arthritis   . History of blood clots   . Anxiety   . Arrhythmia   . Shingles   . Pancreatitis     Past Surgical History  Procedure Laterality Date  . Foot surgery  2003    x 3, 2 on right, 1 on left  . Breast lumpectomy Right     radiation for 6 weeks  . Meniscus repair Right     Allergies  Allergen Reactions  . Codeine Nausea And Vomiting  . Tequin     Severe stomach pain  . Penicillins Nausea Only and Rash    Family history and social history have been reviewed.  Review of Systems: Negative for abdominal pain. Positive for diarrhea. Negative for rectal bleeding  The remainder of the 10 point ROS is negative except as outlined in the H&P  Physical Exam: General Appearance Well developed, in no distress Eyes  Non icteric  HEENT  Non traumatic, normocephalic  Mouth No lesion, tongue papillated, no cheilosis Neck Supple without adenopathy, thyroid not enlarged, no carotid bruits, no JVD Lungs Clear to auscultation bilaterally COR Normal S1, normal S2, regular rhythm, no murmur, quiet precordium Abdomen minimal tenderness in left upper quadrant. No fullness or pulsations Rectal not done Extremities  No pedal edema Skin No lesions Neurological Alert and oriented x 3 Psychological Normal mood and affect  Assessment and Plan:   Problem #1 known cholelithiasis with an otherwise normal-appearing gallbladder. She has acute pancreatitis with development of a pancreatic pseudocyst occupying the gastrosplenic space measuring 4x5 cm. She has a mildly   dilated main pancreatic duct consistent with chronic pancreatitis. The pancreatitis may be biliary in origin or possibly autoimmune. She has t collagenous colitis. She has also splenic vein and portal vein thrombosis which raises a question of a chronic inflammatory process in the past affecting the portal vein. She will likely need a cholecystectomy but before we refer her, I would like to do an EUS to define her pancreas and the  pancreatic pseudocyst to r/o PMNeoplasm. I have instructed her on a strict low fat diet and absolutely no alcohol. She does not want her collagenous colitis to be treated. She was on Entocort in the past.but prefers to have frequent stools.    Delfin Edis 11/15/2013

## 2013-11-23 NOTE — Anesthesia Postprocedure Evaluation (Signed)
  Anesthesia Post-op Note  Patient: Annette Beltran  Procedure(s) Performed: Procedure(s) (LRB): UPPER ENDOSCOPIC ULTRASOUND (EUS) LINEAR (N/A)  Patient Location: PACU  Anesthesia Type: MAC  Level of Consciousness: awake and alert   Airway and Oxygen Therapy: Patient Spontanous Breathing  Post-op Pain: mild  Post-op Assessment: Post-op Vital signs reviewed, Patient's Cardiovascular Status Stable, Respiratory Function Stable, Patent Airway and No signs of Nausea or vomiting  Last Vitals:  Filed Vitals:   11/23/13 1002  BP: 131/59  Pulse: 47  Temp:   Resp: 18    Post-op Vital Signs: stable   Complications: No apparent anesthesia complications

## 2013-11-24 ENCOUNTER — Telehealth: Payer: Self-pay

## 2013-11-24 ENCOUNTER — Encounter (HOSPITAL_COMMUNITY): Payer: Self-pay | Admitting: Gastroenterology

## 2013-11-24 DIAGNOSIS — C259 Malignant neoplasm of pancreas, unspecified: Secondary | ICD-10-CM

## 2013-11-24 NOTE — Telephone Encounter (Signed)
Message copied by Barron Alvine on Fri Nov 24, 2013  8:37 AM ------      Message from: Owens Loffler P      Created: Thu Nov 23, 2013 10:18 AM       See below, newly diagnosed pancreatic adenocarcinoma.            Noel Henandez, she needs referral to Drs. Julieanne Manson, [place].            Barnett Applebaum, Can you put her on for next week GI cancer conference.                        Impression:      2.5cm subtle mass in body/neck of pancreas which is causing obstruction, dilation of the main pancreatid duct. The mass directly abuts the splenic vein but no other major vessels. Preliminary cytology review from the mass is positive for malignancy (adenocarcinoma), awaiting final reading. The fluid collection in region of tail of pancreas is probably result of main PD obstruction caused by the mass. The fluid was aspirated and sent for cytology, preliminary reading showed old blood, no clear neoplastic cells. She will complete 3 days of twice daily cipro.  My office will begin referrals to Dr. Benay Spice, Barry Dienes.  I discussed findings with Dr. Olevia Perches today.       ------

## 2013-11-24 NOTE — Telephone Encounter (Signed)
Referral has been made.

## 2013-11-24 NOTE — Telephone Encounter (Signed)
Spoke with patient and gave her recommendations. 

## 2013-11-24 NOTE — Telephone Encounter (Signed)
Referral made Barnett Applebaum has appt for Dr Barry Dienes

## 2013-11-24 NOTE — Telephone Encounter (Signed)
Message copied by Barron Alvine on Fri Nov 24, 2013  8:41 AM ------      Message from: Owens Loffler P      Created: Thu Nov 23, 2013 10:18 AM       See below, newly diagnosed pancreatic adenocarcinoma.            Alizae Bechtel, she needs referral to Drs. Julieanne Manson, [place].            Barnett Applebaum, Can you put her on for next week GI cancer conference.                        Impression:      2.5cm subtle mass in body/neck of pancreas which is causing obstruction, dilation of the main pancreatid duct. The mass directly abuts the splenic vein but no other major vessels. Preliminary cytology review from the mass is positive for malignancy (adenocarcinoma), awaiting final reading. The fluid collection in region of tail of pancreas is probably result of main PD obstruction caused by the mass. The fluid was aspirated and sent for cytology, preliminary reading showed old blood, no clear neoplastic cells. She will complete 3 days of twice daily cipro.  My office will begin referrals to Dr. Benay Spice, Barry Dienes.  I discussed findings with Dr. Olevia Perches today.       ------

## 2013-11-27 ENCOUNTER — Telehealth: Payer: Self-pay | Admitting: Gastroenterology

## 2013-11-27 NOTE — Telephone Encounter (Signed)
See results note from this AM.

## 2013-11-27 NOTE — Telephone Encounter (Signed)
On call note @ 2200. Pt calls with fever of 100.3 this evening and several episodes of LUQ pain earlier today. She took aleve about 2100 and temp in decreasing. Had EUS with cyst aspiration and FNA on 1/22. She states she completed a 3 day course of Cipro today. Advised to stay on clear liquids. Call back or go to ED if fever or pain returns. Dr Ardis Hughs to review Monday and provide further advice.

## 2013-11-28 ENCOUNTER — Ambulatory Visit: Payer: Medicare HMO | Admitting: Nutrition

## 2013-11-28 ENCOUNTER — Ambulatory Visit (INDEPENDENT_AMBULATORY_CARE_PROVIDER_SITE_OTHER): Payer: Medicare HMO | Admitting: Internal Medicine

## 2013-11-28 ENCOUNTER — Ambulatory Visit
Admission: RE | Admit: 2013-11-28 | Discharge: 2013-11-28 | Disposition: A | Discharge status: Home / Self Care | Payer: Medicare HMO | Source: Ambulatory Visit | Attending: Internal Medicine | Admitting: Internal Medicine

## 2013-11-28 ENCOUNTER — Encounter: Payer: Self-pay | Admitting: Internal Medicine

## 2013-11-28 VITALS — BP 110/74 | HR 76 | Temp 99.7°F | Wt 160.0 lb

## 2013-11-28 DIAGNOSIS — J13 Pneumonia due to Streptococcus pneumoniae: Secondary | ICD-10-CM

## 2013-11-28 DIAGNOSIS — J181 Lobar pneumonia, unspecified organism: Secondary | ICD-10-CM

## 2013-11-28 DIAGNOSIS — R059 Cough, unspecified: Secondary | ICD-10-CM

## 2013-11-28 DIAGNOSIS — H6500 Acute serous otitis media, unspecified ear: Secondary | ICD-10-CM

## 2013-11-28 DIAGNOSIS — R05 Cough: Secondary | ICD-10-CM

## 2013-11-28 DIAGNOSIS — J069 Acute upper respiratory infection, unspecified: Secondary | ICD-10-CM

## 2013-11-28 DIAGNOSIS — H6503 Acute serous otitis media, bilateral: Secondary | ICD-10-CM

## 2013-11-28 DIAGNOSIS — C259 Malignant neoplasm of pancreas, unspecified: Secondary | ICD-10-CM

## 2013-11-28 DIAGNOSIS — R1013 Epigastric pain: Secondary | ICD-10-CM

## 2013-11-28 MED ORDER — AZITHROMYCIN 250 MG PO TABS: ORAL_TABLET | ORAL | Status: DC

## 2013-11-28 NOTE — Progress Notes (Addendum)
Patient is a 68 year old female diagnosed with pancreatitis.  She is a patient of Dr. Jana Hakim.  Past medical history includes breast cancer, Macular degeneration, vitamin D deficiency, osteopenia, arthritis, anxiety, shingles, and pancreatitis.  Medications include Xanax, vitamin C, B complex, multivitamin, and Prilosec.  Labs include glucose of 108.  Height: 64.5 inches. Weight: 152 pounds January 22. Usual body weight: 175 pounds October 2014. BMI: 25.7.  Patient reports she was told to follow a low-fat diet with no alcohol intake.  She reports compliance with the diet but she is not happy to be on this diet.  She recently switched to clear liquids secondary to not feeling well.  She reports she is lactose intolerant.  Patient expresses frustration over not being able to eat foods she wants to eat.  Patient complains of indigestion.    Nutrition diagnosis: Food and nutrition related knowledge deficit related to diagnosis of pancreas cancer and associated treatments as evidenced by no prior need for nutrition related information.  Intervention: Patient was educated on consuming low-fat diet and continuing to avoid alcohol.  I have encouraged patient to keep her fat below 40-50 grams daily.  Explained the importance of measuring and controlling portions of fat-containing foods.  I also educated patient on strategies for reflux. I encouraged patient to consume small, frequent meals and snacks, including increased protein foods.  Questions were answered.  Teach back method used.  Monitoring, evaluation, goals: Patient will tolerate adequate calories and protein to minimize further weight loss.  Next visit: Followup will be completed during patient's chemotherapy.  This has not been scheduled yet.  She agrees to call me with questions before treatment begins.

## 2013-11-29 ENCOUNTER — Other Ambulatory Visit: Payer: Self-pay | Admitting: Gastroenterology

## 2013-11-29 ENCOUNTER — Other Ambulatory Visit: Payer: Medicare HMO

## 2013-11-29 ENCOUNTER — Telehealth: Payer: Self-pay

## 2013-11-29 ENCOUNTER — Ambulatory Visit
Admission: RE | Admit: 2013-11-29 | Discharge: 2013-11-29 | Disposition: A | Discharge status: Home / Self Care | Payer: Medicare HMO | Source: Ambulatory Visit | Attending: Internal Medicine | Admitting: Internal Medicine

## 2013-11-29 DIAGNOSIS — C259 Malignant neoplasm of pancreas, unspecified: Secondary | ICD-10-CM

## 2013-11-29 LAB — COMPREHENSIVE METABOLIC PANEL
ALT: 14 U/L (ref 0–35)
AST: 17 U/L (ref 0–37)
Albumin: 3.5 g/dL (ref 3.5–5.2)
Alkaline Phosphatase: 64 U/L (ref 39–117)
BUN: 10 mg/dL (ref 6–23)
CALCIUM: 9.1 mg/dL (ref 8.4–10.5)
CHLORIDE: 100 meq/L (ref 96–112)
CO2: 29 mEq/L (ref 19–32)
Creat: 0.62 mg/dL (ref 0.50–1.10)
Glucose, Bld: 110 mg/dL — ABNORMAL HIGH (ref 70–99)
Potassium: 3.5 mEq/L (ref 3.5–5.3)
SODIUM: 139 meq/L (ref 135–145)
Total Bilirubin: 0.4 mg/dL (ref 0.3–1.2)
Total Protein: 6.4 g/dL (ref 6.0–8.3)

## 2013-11-29 LAB — CBC WITH DIFFERENTIAL/PLATELET
BASOS ABS: 0 10*3/uL (ref 0.0–0.1)
Basophils Relative: 0 % (ref 0–1)
EOS PCT: 2 % (ref 0–5)
Eosinophils Absolute: 0.3 10*3/uL (ref 0.0–0.7)
HCT: 35.5 % — ABNORMAL LOW (ref 36.0–46.0)
Hemoglobin: 12.8 g/dL (ref 12.0–15.0)
LYMPHS PCT: 15 % (ref 12–46)
Lymphs Abs: 1.6 10*3/uL (ref 0.7–4.0)
MCH: 30.1 pg (ref 26.0–34.0)
MCHC: 36.1 g/dL — ABNORMAL HIGH (ref 30.0–36.0)
MCV: 83.5 fL (ref 78.0–100.0)
Monocytes Absolute: 1 10*3/uL (ref 0.1–1.0)
Monocytes Relative: 10 % (ref 3–12)
NEUTROS PCT: 73 % (ref 43–77)
Neutro Abs: 7.5 10*3/uL (ref 1.7–7.7)
PLATELETS: 391 10*3/uL (ref 150–400)
RBC: 4.25 MIL/uL (ref 3.87–5.11)
RDW: 13.9 % (ref 11.5–15.5)
WBC: 10.4 10*3/uL (ref 4.0–10.5)

## 2013-11-29 LAB — AMYLASE: Amylase: 47 U/L (ref 0–105)

## 2013-11-29 LAB — LIPASE: Lipase: 110 U/L — ABNORMAL HIGH (ref 0–75)

## 2013-11-29 MED ORDER — IOHEXOL 300 MG/ML  SOLN
75.0000 mL | Freq: Once | INTRAMUSCULAR | Status: AC | PRN
  Administered 2013-11-29: 75 mL via INTRAVENOUS

## 2013-11-29 NOTE — Telephone Encounter (Signed)
Patient is scheduled for a CT chest with contrast today at 1:15 at [facility] at [address] precertification obtained and sent there. Patient aware of appointment.

## 2013-11-30 ENCOUNTER — Ambulatory Visit (HOSPITAL_BASED_OUTPATIENT_CLINIC_OR_DEPARTMENT_OTHER): Payer: Medicare HMO | Admitting: Oncology

## 2013-11-30 ENCOUNTER — Telehealth: Payer: Self-pay | Admitting: *Deleted

## 2013-11-30 ENCOUNTER — Other Ambulatory Visit: Payer: Self-pay | Admitting: Oncology

## 2013-11-30 VITALS — BP 127/78 | HR 72 | Temp 97.8°F | Resp 18 | Ht 64.5 in | Wt 158.2 lb

## 2013-11-30 DIAGNOSIS — K52831 Collagenous colitis: Secondary | ICD-10-CM

## 2013-11-30 DIAGNOSIS — F1010 Alcohol abuse, uncomplicated: Secondary | ICD-10-CM

## 2013-11-30 DIAGNOSIS — C50911 Malignant neoplasm of unspecified site of right female breast: Secondary | ICD-10-CM

## 2013-11-30 DIAGNOSIS — Z853 Personal history of malignant neoplasm of breast: Secondary | ICD-10-CM

## 2013-11-30 DIAGNOSIS — C259 Malignant neoplasm of pancreas, unspecified: Secondary | ICD-10-CM

## 2013-11-30 DIAGNOSIS — D735 Infarction of spleen: Secondary | ICD-10-CM

## 2013-11-30 DIAGNOSIS — D7389 Other diseases of spleen: Secondary | ICD-10-CM

## 2013-11-30 DIAGNOSIS — J90 Pleural effusion, not elsewhere classified: Secondary | ICD-10-CM

## 2013-11-30 DIAGNOSIS — I700 Atherosclerosis of aorta: Secondary | ICD-10-CM

## 2013-11-30 DIAGNOSIS — M8580 Other specified disorders of bone density and structure, unspecified site: Secondary | ICD-10-CM

## 2013-11-30 DIAGNOSIS — C251 Malignant neoplasm of body of pancreas: Secondary | ICD-10-CM | POA: Insufficient documentation

## 2013-11-30 LAB — CANCER ANTIGEN 19-9: CA 19-9: 47 U/mL — ABNORMAL HIGH (ref <35.0)

## 2013-11-30 NOTE — Telephone Encounter (Signed)
appts made and printed...td 

## 2013-11-30 NOTE — Progress Notes (Signed)
   Subjective:    Patient ID: Annette Beltran, female    DOB: Oct 30, 1946, 68 y.o.   MRN: [id number]  HPI Patient had endoscopic ultrasound procedure last week on January 22. 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 Surgical resection is being considered by consultants. She has developed URI symptoms. Has had low-grade fever and congestion. Some cough. Had an attack of abdominal pain on Sunday evening 3 different times. No vomiting.    Review of Systems     Objective:   Physical Exam TMs are full bilaterally. Pharynx is clear. Neck is supple. Chest clear to auscultation however there appears to be some bronchial breath sounds left chest but no rales or wheezing.        Assessment & Plan:  Bilateral serous otitis media  URI  Carcinoma of the pancreas-recent attacks of abdominal pain. Check amylase and lipase. History of pancreatitis.  Rule out pneumonia  Plan: Patient to have chest x-ray. Start Zithromax Z-Pak 2 tablets day one followed by 1 tablet days 2 through 5. Appointment to  see Dr. Jana Hakim January 29  Addendum: Chest x-ray abnormal. Pleural fluid noted in left chest with possible consolidation. CT of chest ordered  25 minutes spent with patient discussing issues

## 2013-11-30 NOTE — Patient Instructions (Addendum)
Chest x-ray shows possible consolidation and pleural effusion. Have chest CT. Start Zithromax Z-Pak take as directed

## 2013-12-01 ENCOUNTER — Other Ambulatory Visit: Payer: Self-pay | Admitting: Oncology

## 2013-12-01 ENCOUNTER — Telehealth: Payer: Self-pay | Admitting: Internal Medicine

## 2013-12-01 ENCOUNTER — Ambulatory Visit (HOSPITAL_COMMUNITY)
Admission: RE | Admit: 2013-12-01 | Discharge: 2013-12-01 | Disposition: A | Discharge status: Home / Self Care | Payer: Medicare HMO | Source: Ambulatory Visit | Attending: Radiology | Admitting: Radiology

## 2013-12-01 ENCOUNTER — Ambulatory Visit (HOSPITAL_COMMUNITY)
Admission: RE | Admit: 2013-12-01 | Discharge: 2013-12-01 | Disposition: A | Discharge status: Home / Self Care | Payer: Medicare HMO | Source: Ambulatory Visit | Attending: Oncology | Admitting: Oncology

## 2013-12-01 DIAGNOSIS — K859 Acute pancreatitis without necrosis or infection, unspecified: Secondary | ICD-10-CM

## 2013-12-01 DIAGNOSIS — J918 Pleural effusion in other conditions classified elsewhere: Secondary | ICD-10-CM

## 2013-12-01 DIAGNOSIS — J90 Pleural effusion, not elsewhere classified: Secondary | ICD-10-CM | POA: Insufficient documentation

## 2013-12-01 DIAGNOSIS — J9819 Other pulmonary collapse: Secondary | ICD-10-CM | POA: Insufficient documentation

## 2013-12-01 DIAGNOSIS — C259 Malignant neoplasm of pancreas, unspecified: Secondary | ICD-10-CM | POA: Insufficient documentation

## 2013-12-01 LAB — LACTATE DEHYDROGENASE, PLEURAL OR PERITONEAL FLUID: LD, Fluid: 128 U/L — ABNORMAL HIGH (ref 3–23)

## 2013-12-01 LAB — ALBUMIN, FLUID (OTHER): ALBUMIN FL: 2.3 g/dL

## 2013-12-01 LAB — AMYLASE, PLEURAL FLUID: Amylase, Pleural Fluid: 80 U/L

## 2013-12-01 NOTE — Telephone Encounter (Signed)
No generic for Nexium. Is she having reflux symtoms? Otherwise Nexium will not help. Xanax is the best for anxiety unless she wants to start a chronic med such as Paxil for depression 10 mg daily.

## 2013-12-01 NOTE — Procedures (Signed)
Successful US guided left thoracentesis. Yielded 400 ml of serous fluid. Pt tolerated procedure well. No immediate complications.  Specimen was sent for labs. CXR ordered.  Tsosie Billing D PA-C 12/01/2013 11:56 AM

## 2013-12-02 DIAGNOSIS — C50911 Malignant neoplasm of unspecified site of right female breast: Secondary | ICD-10-CM | POA: Insufficient documentation

## 2013-12-02 DIAGNOSIS — I700 Atherosclerosis of aorta: Secondary | ICD-10-CM | POA: Insufficient documentation

## 2013-12-02 NOTE — Progress Notes (Signed)
[place]  [phone] [phone]     ID: Annette Beltran OB: [birth year]  MR#: [id number]  IEP#:[id number]  PCP: [name], MD GYN:   SU: [name] OTHER MD: Delfin Edis, [name]  CHIEF COMPLAINT: "I haven't felt well since September"  HISTORY OF PRESENT ILLNESS: I formerly followed Annette Beltran for a stage I breast cancer, which was treated with surgery, radiation, and anti-estrogens. She was released from followup here in 2007. In addition, in 2012 she had abdominal pain leading to hospitalization. 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 The patient also has a history of collagenous colitis, which of course carries its own set of symptoms, making identification of her new attic problem more difficult.  Annette Beltran tells me she started "feeling bad" in September of 2014. There was abdominal discomfort localizing to the left upper o'clock on, worse with inspiration. CT angiography 08/21/2013 showed no clot and no significant abnormalities. Plain rib and left shoulder views were negative. CT abdomen and pelvis with contrast 08/22/2013 showed slight prominence of the pancreatic duct in the distal body and tail with a new small structure emanating from the tail of the pancreas consistent with a pseudocyst. Exudate near the tail of the pancreas suggested pancreatitis. MRI of the abdomen 09/03/2013 confirmed several ill-defined fluid collections between the gastric fundus and the splenic hilum surrounding the pancreatic tail. There was no evidence of a pancreatic mass. The pancreatic tail did enhanced following contrast. The splenic vein appeared chronically thrombosed with a small amount of nonocclusive thrombus extending into the main and left portal  veins.  On 09/26/2013 amylase was 237 and lipase 513.0, consistent with acute pancreatitis. These numbers subsequently dropped in on 11/28/2013 amylase was normal at 47 and lipase was mildly elevated at 110  With continuing left-sided abdominal discomfort, abdominal ultrasound 11/13/2013 showed multiple gallstones with no evidence of cholecystitis. Evaluation of the pancreas showed a hypoechoic lobulated focus measuring up to 7.4 cm felt to be nonspecific. Repeat abdominal CT 11/14/2013 showed diffuse pancreatic atrophy without evidence of a mass, again consistent with chronic pancreatitis. There was mild soft tissue stranding suspicious for mild superimposed acute pancreatitis. A new rim-enhancing fluid collection was noted in the gastrosplenic ligament measuring 4.5 cm, consistent with a pseudocyst. Finally on 11/23/2013, endoscopic ultrasonography under Oretha Caprice showed a subtle 2.5 cm soft tissue mass in the mid-pancreas, abutting the splenic vein. This was biopsied x2, with cytology (NZB [id number]) showing adenocarcinoma. A peripancreatic fluid collection measuring over 5 cm was aspirated yielding 25 cc of chocolate colored fluid. Two or three scattered round hypoechoic lymph nodes were noted near the pancreatic tail but were not sampled.  In addition, on 11/28/2013 a chest x-ray obtained to evaluate upper respiratory symptoms showed a small to moderate left effusion. Chest CT scan 11/29/2013 confirmed a moderate left-sided pleural effusion with no suspicious appearing pulmonary nodules or masses associated with it. The complex cystic lesion associated with the tail of the pancreas was again noted, measuring 5.5 cm despite the recent aspiration procedure.  The patient's subsequent history is as detailed below.  INTERVAL HISTORY: Annette Beltran was evaluated in our clinic on 11/30/2013 accompanied by her husband Annette Beltran and her daughters Annette Beltran and Annette Beltran.  REVIEW OF SYSTEMS: Annette Beltran continues to complain of mild  temperatures, left shoulder and left upper  quadrant discomfort, which is intermittent, but can be severe. She describes herself is fatigued. She tells me she has lost approximately 20 pounds in the last few months. She has a little bit of a runny nose, sore throat, and a dry cough. She has some shortness of breath with vigorous activity. A detailed review of systems today was otherwise noncontributory  PAST MEDICAL HISTORY: Past Medical History  Diagnosis Date  . Breast cancer   . Macular degeneration   . Fluid retention   . Colitis, collagenous   . Vitamin D deficiency   . Osteopenia   . Celiac artery stenosis   . Splenic infarction   . Arthritis   . History of blood clots   . Anxiety   . Shingles   . Pancreatitis   . Arrhythmia     PAST SURGICAL HISTORY: Past Surgical History  Procedure Laterality Date  . Foot surgery  2003    x 3, 2 on right, 1 on left  . Breast lumpectomy Right     radiation for 6 weeks  . Meniscus repair Right   . Eus N/A 11/23/2013    Procedure: UPPER ENDOSCOPIC ULTRASOUND (EUS) LINEAR;  Surgeon: Milus Banister, MD;  Location: [facility];  Service: Endoscopy;  Laterality: N/A;    FAMILY HISTORY Family History  Problem Relation Age of Onset  . Colon cancer Neg Hx   . Aneurysm Mother   . Prostate cancer Father   . Skin cancer Brother   . Heart failure Paternal Aunt   . Pancreatic cancer Maternal Grandmother    the patient's father had a diagnosis of prostate cancer metastatic to the liver or a died at age 61. The patient's mother died secondary to carotid third brain aneurysm at the age of 17. The patient had 2 brothers, no sisters. One brother has a history of melanoma. The patient's mother is mother was diagnosed with pancreatic cancer in her 41s. There is a history of breast cancer and second degree relatives, none before the age of 29  GYNECOLOGIC HISTORY:  Menarche age 51, first live birth age 30, the patient is [place] P2.. Stopped in her early  76s. 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 Her husband of 88 years, Annette Beltran, retired from [employer] and currently works part-time as a Cabin crew with [employer]. Daughter Annette Beltran teaches first grade. Daughter Annette Beltran is a homemaker. The patient has 4 grandchildren Annette Beltran has an additional 5 grandchildren of his own). The patient attends a [employer]    ADVANCED DIRECTIVES: In place   HEALTH MAINTENANCE: History  Substance Use Topics  . Smoking status: Former Smoker -- 0.50 packs/day for 18 years    Types: Cigarettes    Quit date: 11/02/2000  . Smokeless tobacco: Never Used  . Alcohol Use: No     Comment: 1-2 alcohol drinks/day- NONE AS OF 11-20-2013     Colonoscopy:  PAP:  Bone density:  Lipid panel:  Mammography:  Allergies  Allergen Reactions  . Codeine Nausea And Vomiting  . Lactose Intolerance (Gi)   . Tequin     Severe stomach pain  . Penicillins Nausea Only and Rash    Current Outpatient Prescriptions  Medication Sig Dispense Refill  . ALPRAZolam (XANAX) 0.5 MG tablet Take 1 tablet (0.5 mg total) by mouth 2 (two) times daily.  60 tablet  3  . Ascorbic Acid (VITAMIN C PO)  Take 1 tablet by mouth 2 (two) times daily.      Marland Kitchen aspirin 81 MG tablet Take 81 mg by mouth daily.      Marland Kitchen azithromycin (ZITHROMAX Z-PAK) 250 MG tablet Take 2 tablets (500 mg) on  Day 1,  followed by 1 tablet (250 mg) once daily on Days 2 through 5.  6 each  0  . b complex vitamins tablet Take 1 tablet by mouth daily.      . diphenhydrAMINE (BENADRYL) 25 MG tablet Take 25 mg by mouth every 6 (six) hours as needed for itching or allergies.      Marland Kitchen loratadine (CLARITIN) 10 MG tablet Take 10 mg by mouth daily.      . meperidine (DEMEROL) 50 MG tablet Take 50 mg by mouth every 6 (six) hours as needed for moderate pain or severe pain.      . Multiple Vitamins-Minerals (PRESERVISION AREDS PO) Take by mouth.        . naproxen sodium (ANAPROX) 220 MG tablet Take 220 mg by mouth 2 (two) times daily with a meal.      . omeprazole (PRILOSEC) 20 MG capsule Take 1 capsule (20 mg total) by mouth daily.  30 capsule  2   No current facility-administered medications for this visit.    OBJECTIVE: Middle-aged white woman who appears well Filed Vitals:   11/30/13 1433  BP: 127/78  Pulse: 72  Temp: 97.8 F (36.6 C)  Resp: 18     Body mass index is 26.75 kg/(m^2).    ECOG FS:1 - Symptomatic but completely ambulatory  Ocular: Sclerae unicteric, pupils equal, round and reactive to light Ear-nose-throat: Oropharynx clear, dentition in good repair Lymphatic: No cervical or supraclavicular adenopathy Lungs no rales or rhonchi, minimal dullness at the left base  Heart regular rate and rhythm, no murmur appreciated Abd soft, nontender to moderate palpation including the left upper quadrant and epigastrium, positive bowel sounds MSK no focal spinal tenderness, no joint edema Neuro: non-focal, well-oriented, appropriate affect Breasts: Deferred   LAB RESULTS:  Results for ANQUINETTE, PIERRO (MRN [id number]) as of 12/02/2013 10:16  Ref. Range 12/01/2013 11:50  Albumin, Fluid No range found 2.3  Fluid Type-FALB No range found PLEURAL  Fluid Type-FLDH No range found PLEURAL  LD, Fluid Latest Range: 3-23 U/L 128 (H)  Amylase, Pleural Fluid No range found 80    CMP     Component Value Date/Time   NA 139 11/28/2013 1635   K 3.5 11/28/2013 1635   CL 100 11/28/2013 1635   CO2 29 11/28/2013 1635   GLUCOSE 110* 11/28/2013 1635   BUN 10 11/28/2013 1635   CREATININE 0.62 11/28/2013 1635   CREATININE 0.9 11/13/2013 1511   CALCIUM 9.1 11/28/2013 1635   PROT 6.4 11/28/2013 1635   ALBUMIN 3.5 11/28/2013 1635   AST 17 11/28/2013 1635   ALT 14 11/28/2013 1635   ALKPHOS 64 11/28/2013 1635   BILITOT 0.4 11/28/2013 1635   GFRNONAA >60 06/10/2011 0540   GFRAA >60 06/10/2011 0540    I No results found for this basename: SPEP, UPEP,   kappa and lambda light chains    Lab Results  Component Value Date   WBC 10.4 11/28/2013   NEUTROABS 7.5 11/28/2013   HGB 12.8 11/28/2013   HCT 35.5* 11/28/2013   MCV 83.5 11/28/2013   PLT 391 11/28/2013      Chemistry      Component Value Date/Time   NA 139 11/28/2013 1635   K  3.5 11/28/2013 1635   CL 100 11/28/2013 1635   CO2 29 11/28/2013 1635   BUN 10 11/28/2013 1635   CREATININE 0.62 11/28/2013 1635   CREATININE 0.9 11/13/2013 1511      Component Value Date/Time   CALCIUM 9.1 11/28/2013 1635   ALKPHOS 64 11/28/2013 1635   AST 17 11/28/2013 1635   ALT 14 11/28/2013 1635   BILITOT 0.4 11/28/2013 1635       Lab Results  Component Value Date   LABCA2 8 06/08/2011    No components found with this basename: [id number]    No results found for this basename: INR,  in the last 168 hours  Urinalysis    Component Value Date/Time   COLORURINE YELLOW 06/07/2011 1942   APPEARANCEUR CLEAR 06/07/2011 1942   LABSPEC 1.009 06/07/2011 1942   PHURINE 6.0 06/07/2011 1942   GLUCOSEU NEGATIVE 06/07/2011 1942   HGBUR NEGATIVE 06/07/2011 1942   BILIRUBINUR neg 12/25/2011 [place] 06/07/2011 1942   KETONESUR NEGATIVE 06/07/2011 1942   PROTEINUR NEGATIVE 06/07/2011 1942   UROBILINOGEN negative 12/25/2011 1216   UROBILINOGEN 0.2 06/07/2011 1942   NITRITE neg 12/25/2011 1216   NITRITE NEGATIVE 06/07/2011 1942   LEUKOCYTESUR Negative 12/25/2011 1216    STUDIES: Dg Chest 1 View  12/01/2013   CLINICAL DATA:  Left pleural effusion. Immediately status post left thoracentesis.  EXAM: CHEST - 1 VIEW  COMPARISON:  Chest x-ray dated 11/28/2013  FINDINGS: There has been almost complete elimination of the left pleural effusion. Minimal residual fluid and atelectasis at the left lung base. No pneumothorax. Right lung is clear. Heart size and vascularity are normal. No osseous abnormality.  IMPRESSION: No pneumothorax after thoracentesis. Minimal residual fluid and atelectasis at the left base.   Electronically  Signed   By: Rozetta Nunnery M.D.   On: 12/01/2013 12:18   Dg Chest 2 View  11/28/2013   CLINICAL DATA:  Endoscopic exam 4 days ago, sore throat and fever since  EXAM: CHEST  2 VIEW  COMPARISON:  08/22/2013  FINDINGS: The heart size and vascular pattern are normal. The right lung is clear. On the left side, there is a small to moderate pleural effusion with underlying consolidation. This is not seen on the prior study.  IMPRESSION: Small to moderate left effusion with underlying consolidation. Pneumonitis is not excluded.   Electronically Signed   By: Skipper Cliche M.D.   On: 11/28/2013 17:07   Ct Chest W Contrast  11/29/2013   CLINICAL DATA:  Newly diagnosed pancreatic cancer. New left pleural effusion.  EXAM: CT CHEST WITH CONTRAST  TECHNIQUE: Multidetector CT imaging of the chest was performed during intravenous contrast administration.  CONTRAST:  3m OMNIPAQUE IOHEXOL 300 MG/ML  SOLN  COMPARISON:  CT of the abdomen and pelvis 11/14/2013.  FINDINGS: Mediastinum: Heart size is normal. There is no significant pericardial fluid, thickening or pericardial calcification. No pathologically enlarged mediastinal hilar lymph nodes. The esophagus is unremarkable in appearance.  Lungs/Pleura: New moderate left-sided pleural effusion layering dependently with associated passive atelectasis in the left lower lobe basal segments and inferior segment of the lingula. No acute consolidative airspace disease. No definite suspicious appearing pulmonary nodules or masses.  Upper Abdomen: Complex cystic lesion emanating from the tail of the pancreas measuring approximately 5.3 x 4.0 x 5.5 cm, extending into the gastrosplenic ligament. Compared to the recent prior examination from 11/14/2013, there is increasing low-attenuation fluid associated with the splenic capsule, which may be reactive, or may be  related to extension from the mass in the tail of the pancreas, as this does have a similar appearance to the primary lesion.  This extends underneath the left hemidiaphragm. Mild fat stranding is noted adjacent to the tail of the pancreas and in the upper left retroperitoneum, potentially related to inflammation or lymphatic congestion. Geographic low attenuation throughout the right lobe of the liver as compared to the left is favored to represent regional fatty infiltration.  Musculoskeletal: There are no aggressive appearing lytic or blastic lesions noted in the visualized portions of the skeleton.  IMPRESSION: 1. New moderate left pleural effusion with extensive passive atelectasis in the left lung base, as above. 2. No definite signs of metastatic disease to the thorax at this time. 3. Slight interval enlargement of cystic mass in the tail of the pancreas, which now appears likely associated with some subcapsular spread along the posterior and superior aspect of the spleen. Alternatively, the subcapsular splenic fluid collections could be simply reactive fluid or pseudocysts. 4. Probable geographic fatty infiltration of the right lobe of the liver.   Electronically Signed   By: Vinnie Langton M.D.   On: 11/29/2013 14:23   US Abdomen Complete  11/13/2013   CLINICAL DATA:  Known pancreatic cyst now for follow-up. The patient is reporting left-sided abdominal pain. History of previous pancreatitis and splenic infarction.  EXAM: ULTRASOUND ABDOMEN COMPLETE  COMPARISON:  MRI of the abdomen dated September 03, 2013.  FINDINGS: Gallbladder:  The gallbladder is adequately distended. There are multiple small mobile stones with distal shadowing. There is no gallbladder wall thickening or pericholecystic fluid. There is no positive sonographic Murphy's sign. Next item next  Common bile duct:  Diameter: 2.5 mm  Liver:  No focal lesion identified. Within normal limits in parenchymal echogenicity.  IVC:  No abnormality visualized.  Pancreas:  There is no focal cystic or solid appearing pancreatic mass. There is no pancreatic ductal dilation.   Spleen:  The is spleen is not enlarged. In the splenic hilum there is a hypoechoic focus which is been previously demonstrated and may reflect a lesion in the adjacent pancreatic tail or be related to other structures in the splenic hilum.  Right Kidney:  Length: 10.7 cm. Echogenicity within normal limits. No mass or hydronephrosis visualized.  Left Kidney:  Length: 11.8 cm. Echogenicity within normal limits. No mass or hydronephrosis visualized.  Abdominal aorta:  No aneurysm visualized.  Other findings:  No ascites is demonstrated  IMPRESSION: 1. There are gallstones present without evidence of acute cholecystitis. 2. In the region of the pancreatic tail there is a hypoechoic lobulated appearing focus measuring up to 7.4 cm which is nonspecific. This is the area of abnormalities on the previous MRI. If the patient is having acute symptoms or has laboratory abnormalities, follow-up MRI or abdominal CT scan would be useful. 3. There is no acute abnormality of the liver or kidneys or abdominal aorta or inferior vena cava.   Electronically Signed   By: David  Martinique   On: 11/13/2013 09:14   Ct Abdomen Pelvis W Contrast  11/14/2013   CLINICAL DATA:  Cystic pancreatic lesion seen on ultrasound. Pancreatitis. Cholelithiasis.  EXAM: CT ABDOMEN AND PELVIS WITH CONTRAST  TECHNIQUE: Multidetector CT imaging of the abdomen and pelvis was performed using the standard protocol following bolus administration of intravenous contrast.  CONTRAST:  80 mL Omnipaque 300  COMPARISON:  MRI on 09/03/2013 and CT on 08/22/2013  FINDINGS: Diffuse pancreatic atrophy and mild pancreatic ductal dilatation are  again seen, without evidence of pancreatic mass. This is consistent with chronic pancreatitis. There is mild soft tissue stranding surrounding the pancreatic body and tail, suspicious for mild superimposed acute pancreatitis. New rim enhancing fluid collection is seen in the gastrosplenic ligament which measures 3.7 x 4.5 cm,  consistent with pancreatic pseudocysts.  No evidence of cholecystitis or biliary dilatation. No liver masses are identified. The spleen is normal in size and appearance. Adrenal glands and kidneys are also normal in appearance. No evidence of hydronephrosis.  No soft tissue masses identified within the abdomen or pelvis. Uterus and adnexal regions are unremarkable in appearance. No evidence of other inflammatory process or abnormal fluid collections within the pelvis. No evidence of bowel wall thickening or obstruction.  IMPRESSION: Findings suspicious for mild acute on chronic pancreatitis.  New 4.5 cm rim enhancing fluid collection in gastrosplenic ligament, consistent with pancreatic pseudocyst.   Electronically Signed   By: Earle Gell M.D.   On: 11/14/2013 16:14   US Thoracentesis Asp Pleural Space W/img Guide  12/01/2013   CLINICAL DATA:  Carcinoma of the pancreas, left pleural effusion, shortness of breath, request for diagnostic and therapeutic thoracentesis.  EXAM: ULTRASOUND GUIDED left THORACENTESIS  COMPARISON:  None.  FINDINGS: A total of approximately 400 ml of serous fluid was removed. A fluid sample was sent for laboratory analysis.  IMPRESSION: Successful ultrasound guided left thoracentesis yielding 400 ml of pleural fluid.  Read By:  Tsosie Billing PA-C  PROCEDURE: An ultrasound guided thoracentesis was thoroughly discussed with the patient and questions answered. The benefits, risks, alternatives and complications were also discussed. The patient understands and wishes to proceed with the procedure. Written consent was obtained.  Ultrasound was performed to localize and mark an adequate pocket of fluid in the left chest. The area was then prepped and draped in the normal sterile fashion. 1% Lidocaine was used for local anesthesia. Under ultrasound guidance a 19 gauge Yueh catheter was introduced. Thoracentesis was performed. The catheter was removed and a dressing applied.  Complications:   none.   Electronically Signed   By: Aletta Edouard M.D.   On: 12/01/2013 12:17    ASSESSMENT: 68 y.o.  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 Unfortunately, because the pancreas has no capsule, there tends to be early shedding into the peritoneum as well as hematogenous spread to the liver and spread to the regional lymphatics. Direct spread to the multiple structures immediately adjacent to  the pancreas is also possible. Early spread would make her tumor unresectable, and in the absence of resection there is no possibility of cure.  What we actually see is a 2.5 cm mass in the body of the pancreas, associated with a cystic fluid collection measuring more than 5 cm, which was aspirated. The mass directly abuts the splenic vein and there were 2 or 3 round hypoechoic  lymph nodes near the pancreatic tail fluid collection. None of these findings in themselves would make the tumor unresectable.  There is also a new left pleural effusion. I have discussed this with Dr. Barry Dienes and have arranged for left thoracentesis tomorrow. We will send the fluid for cytology as well as standard studies.  If if with this information Dr. Barry Dienes feels the pancreatic cancer is potentially resectable, then surgery would be step 1. Once the patient recovers, we would discuss the final pathology and likely proceed to adjuvant chemotherapy. My suggestion would be 3 months of chemotherapy before adding radiation. This would give any metastases that may be occult in the liver an opportunity to manifest themselves, which would make radiation otiose. Ideally the patient would receive 6 months of chemotherapy with radiation along with the second-half of those treatments.  Devon has a very good understanding of the overall plan. She agrees with it. She will call with any problems that may develop before her next visit here which I have tentatively scheduled for 12/25/2013    Chauncey Cruel, MD   12/02/2013 10:23 AM

## 2013-12-04 ENCOUNTER — Other Ambulatory Visit: Payer: Self-pay

## 2013-12-04 ENCOUNTER — Encounter (INDEPENDENT_AMBULATORY_CARE_PROVIDER_SITE_OTHER): Payer: Self-pay | Admitting: General Surgery

## 2013-12-04 ENCOUNTER — Telehealth (INDEPENDENT_AMBULATORY_CARE_PROVIDER_SITE_OTHER): Payer: Self-pay

## 2013-12-04 ENCOUNTER — Ambulatory Visit (INDEPENDENT_AMBULATORY_CARE_PROVIDER_SITE_OTHER): Payer: Medicare HMO | Admitting: General Surgery

## 2013-12-04 VITALS — BP 136/74 | HR 64 | Temp 98.4°F | Resp 14 | Ht 64.5 in | Wt 156.2 lb

## 2013-12-04 DIAGNOSIS — R609 Edema, unspecified: Secondary | ICD-10-CM

## 2013-12-04 DIAGNOSIS — C259 Malignant neoplasm of pancreas, unspecified: Secondary | ICD-10-CM

## 2013-12-04 MED ORDER — PAROXETINE HCL 10 MG PO TABS: 10.0000 mg | ORAL_TABLET | Freq: Every day | ORAL | Status: DC

## 2013-12-04 MED ORDER — FUROSEMIDE 20 MG PO TABS: 20.0000 mg | ORAL_TABLET | Freq: Every day | ORAL | Status: DC

## 2013-12-04 MED ORDER — POTASSIUM CHLORIDE ER 10 MEQ PO TBCR: 10.0000 meq | EXTENDED_RELEASE_TABLET | Freq: Every day | ORAL | Status: DC

## 2013-12-04 NOTE — Telephone Encounter (Signed)
Pt made aware that her cytology results show no cancer cells.

## 2013-12-04 NOTE — Patient Instructions (Signed)
We will be optimistic about cytology and schedule surgery.  If it is positive, we will put in port a cath.    If negative, we will proceed with surgery.  Plan is to do diagnostic laparoscopy (put in camera to look around).  If no sign of spread, would open and take out end of pancreas, spleen, and possibly part of stomach.  If we see that cancer is spread, would take out gallbladder and place port a cath.     Pancreas Surgery  THE OPERATION: The goal of the operation is to remove masses in the tail of the pancreas.  The distal pancreas and spleen will be removed.  The reason so much needs to be removed is that the blood supply and lymphatic channels and nodes are closely interconnected.  The operation takes between 2-4 hours depending on the amount of scar tissue you have present from prior surgeries, or from the mass.    WHAT HAPPENS IN THE OPERATING ROOM: I will start the operation with a diagnostic laparoscopy.  This means we will make small incisions to see if there is visible cancer outside of the pancreas.  If there is visible spread of cancer, I will stop the operation because research has shown that survival for pancreatic cancer is worse if you undergo a big operation without being able to remove all the cancer.  If no other cancer is seen, I will proceed with the rest of the operation.    Whatever the operative findings, I will discuss the case with your family after we are done in the operating room.  I will talk to you in the next few days when you are more awake.  I will see you in the hospital every weekday that I am not out of town.  My partners help see patients on the weekends and if I am out of town.    WHAT HAPPENS AFTER SURGERY: After surgery, you will go first to the recovery room,then to an appropriate level of room.  You will have many tubes and lines in place which is standard for this operation.   You will have a tube in your nose for 1-2 days in order to suction out  the stomach.  YOU WILL NOT BE ABLE TO EAT FOR SEVERAL DAYS AFTER SURGERY.  You will have a catheter in your bladder.  On your abdomen, you will have a surgical drains and possibly a pain pump with numbing medicine.  You will have compression stockings on your legs to decrease the risk of blood clots.    We will address your pain in several ways.  We will use an IV pain pump called a "PCA," or Patient Controlled Analgesia.  This allows you to press a button and immediately receive a dose of pain medication without waiting for a nurse.  We also use IV Tylenol and sometimes IV Toradol which is similar to ibuprofen.  You may also have a pump with numbing medicine delivered directly to your incision.  I use doses and medications that work for the majority of people, but you may need an adjustment to the dose or type of medicine if your pain is not adequately controlled.  Your throat may be sore, in which case you may need a throat spray or lozenges.    We will ask you to get out of bed the day after surgery in order to maximize your chances of not having complications.  Your risk of pneumonia and blood clots  is lower with walking and sitting in the chair.  We will also ask you to perform breathing exercises.  We will also ask to you walk in your room and in the halls for the above reasons, but also in order for you to keep up your strength.    EATING: We will usually start you on clear liquids in around 1-2 days if your bowel function seems to have returned.  We advance your diet slowly to make sure you are tolerating each step.  All patients do not have a normal appetite when they go home Most patients also find that their taste buds do not seem the same right after surgery, and this can continue into the time of possible post operative chemotherapy and radiation.  Some patients develop diabetes and will need assistance from a primary care doctor for medication.    OTHER TREATMENT? I will present your case  when the pathology is available at our GI Multidisciplinary conference which occurs every 1-2 weeks.  Medical and Radiation Oncologists are present, as well as the pathologist and radiologist in addition to myself.  If you have a larger tumor or if you have positive lymph nodes, we may have the oncologist stop by to see you while you are in the hospital.  Most firm post op treatment plans occur, however, once you are an outpatient because we will need to see how you are recovering from surgery.  GOING HOME! Usually you are able to go home in 3-7 days, depending on whether or not complications happen and what is going on with your overall health status.   If you have more health problems or if you have limited help at home, the therapists and nurses may recommend a temporary rehab or nursing facility to help you get back on your feet before you go home.  These decisions would be made while you are in the hospital with the assistance of a social worker or case manager.    Please bring all insurance/disability forms to our office for the staff to fill out   POSSIBLE COMPLICATIONS This is a very extensive operation and includes complications listed below: Bleeding Infection and possible wound complications such as hernia Damage to adjacent structures Leak of pancreas (5-10%) Possible need for other procedures, such as abscess drains in radiology or endoscopy.   Possible prolonged hospital stay Possible development of diabetes or worsening of current diabetes. (5-20%) Possible diarrhea from lack of pancreatic enzymes.   Prolonged fatigue/weakness/appetite MOST PATIENTS' ENERGY LEVEL IS NOT BACK TO NORMAL FOR AT LEAST 2-3 MONTHS.  OLDER PATIENTS MAY FEEL WEAK FOR LONGER PERIODS OF TIME.   Possible early recurrence of cancer Possible complications of your medical problems such as heart disease or arrhythmias. Death (less than 1%)  All possible complications are not listed, just the most common.     FURTHER INFORMATION? Please ask questions if you find something that we did not discuss in the office and would like more information.  If you would like another appointment if you have many questions or if your family members would like to come as well, please contact the office.    IF YOU ARE TAKING ASPIRIN, PLAVIX, COUMADIN, OR OTHER BLOOD THINNERS, LET us KNOW IMMEDIATELY SO WE CAN CONTACT YOUR PRESCRIBING HEALTH CARE PROVIDER TO HOLD THE MEDICATION FOR 5-7 DAYS BEFORE SURGERY

## 2013-12-04 NOTE — Telephone Encounter (Signed)
Patient informed. Will start Paxil 10mg  daily. Rx sent to [facility].

## 2013-12-05 LAB — BODY FLUID CULTURE: CULTURE: NO GROWTH

## 2013-12-06 ENCOUNTER — Other Ambulatory Visit (HOSPITAL_COMMUNITY): Payer: Medicare Other

## 2013-12-06 NOTE — Assessment & Plan Note (Addendum)
We will await cytology from the thoracentesis. If negative, will plan diagnostic laparoscopy and open distal pancreatectomy/splenectomy.  Pt will also need cholecystectomy.  Will abort open resection if peritoneal mets are seen, but would still do cholecystectomy and place port. Pt will need to see genetics since she has personal history of breast cancer and first degree relative with prostate cancer in addition to her diagnosis of pancreatic cancer.   I discussed that due to the location of the mass, we would proceed with open resection.  The mass is directly over the confluence of the vessels.  I think we would be much less likely to get good margins if we attempted to do laparoscopically.  I discussed the surgery with the patient including diagrams of anatomy.  I discussed the potential for diagnostic laparoscopy.  In the case of pancreatic cancer, if spread of the disease is found, we will abort the procedure and not proceed with resection.  The rationale for this was discussed with the patient.  There has not been data to support resection of Stage IV disease in terms of survival benefit.    We discussed possible complications including: Bleeding Infection and possible wound complications such as hernia Damage to adjacent structures Leak of the pancreas Possible need for other procedures Possible prolonged hospital stay Possible development of diabetes or worsening of current diabetes.  Possible pancreatic exocrine insufficiency Prolonged fatigue/weakness/appetite Difficulty with eating or post operative nausea Possible early recurrence of cancer   The patient understands and wishes to proceed.  The patient has been advised to turn in disability paperwork to our office.    45 min spent with exam and counseling.  >50% spent in counseling.

## 2013-12-07 ENCOUNTER — Other Ambulatory Visit (INDEPENDENT_AMBULATORY_CARE_PROVIDER_SITE_OTHER): Payer: Self-pay | Admitting: General Surgery

## 2013-12-07 ENCOUNTER — Telehealth: Payer: Self-pay | Admitting: Internal Medicine

## 2013-12-07 DIAGNOSIS — C259 Malignant neoplasm of pancreas, unspecified: Secondary | ICD-10-CM

## 2013-12-07 NOTE — Telephone Encounter (Signed)
Patient is having surgery on 12/20/13. She is also seeing oncology. She is asking if she needs a f/u with Dr. Olevia Perches. Please, advise.

## 2013-12-07 NOTE — Telephone Encounter (Signed)
I have spoken with the pt  Personally. I don't need to see her in the office at this time. We spoke about her surgery.

## 2013-12-07 NOTE — Progress Notes (Signed)
Chief Complaint  Patient presents with  . New Evaluation    eval possible pancreatic cancer    HISTORY: Pt is a 68 yo F who presents with history of splenic infarct and abdominal pain.  She had worsening of the pain which seemed to be left chest pain.  She went to the ED and got a chest CT which was negative for PE.  She continued to have horrible pain, and an abd ct was ordered the next day by her PCP.  This demonstrated a cystic mass in the tail of the pancreas and pancreatitis.  She subsequently underwent an endoscopic ultrasound and was found to a mass in the center of the pancreas.  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  She denies diabetes, diarrhea, or weight loss.  She has a history of breast cancer.  She was found to have a pleural effusion on left.  This was aspirated on 1/30.    Past Medical History  Diagnosis Date  . Breast cancer   . Macular degeneration   . Fluid retention   . Colitis, collagenous   . Vitamin D deficiency   . Osteopenia   . Celiac artery stenosis   . Splenic infarction   . Arthritis   . History of blood clots   . Anxiety   . Shingles   . Pancreatitis   . Arrhythmia   . Osteoporosis   . Clotting disorder     Past Surgical History  Procedure Laterality Date  . Foot surgery  2003    x 3, 2 on right, 1 on left  . Breast lumpectomy Right     radiation for 6 weeks  . Meniscus repair Right   . Eus N/A 11/23/2013    Procedure: UPPER ENDOSCOPIC ULTRASOUND (EUS) LINEAR;  Surgeon: Milus Banister, MD;  Location: [facility];  Service: Endoscopy;  Laterality: N/A;    Current Outpatient Prescriptions  Medication Sig Dispense Refill  . ALPRAZolam (XANAX) 0.5 MG tablet Take 1 tablet (0.5 mg total) by mouth 2 (two) times daily.  60 tablet  3  . Ascorbic Acid (VITAMIN C PO) Take 1 tablet by mouth 2 (two) times daily.      Marland Kitchen aspirin 81 MG tablet Take 81 mg by mouth daily.      Marland Kitchen azithromycin (ZITHROMAX Z-PAK) 250 MG tablet Take 2 tablets (500 mg) on   Day 1,  followed by 1 tablet (250 mg) once daily on Days 2 through 5.  6 each  0  . b complex vitamins tablet Take 1 tablet by mouth daily.      . diphenhydrAMINE (BENADRYL) 25 MG tablet Take 25 mg by mouth every 6 (six) hours as needed for itching or allergies.      Marland Kitchen loratadine (CLARITIN) 10 MG tablet Take 10 mg by mouth daily.      . meperidine (DEMEROL) 50 MG tablet Take 50 mg by mouth every 6 (six) hours as needed for moderate pain or severe pain.      . Multiple Vitamins-Minerals (PRESERVISION AREDS PO) Take by mouth.       . naproxen sodium (ANAPROX) 220 MG tablet Take 220 mg by mouth 2 (two) times daily with a meal.      . omeprazole (PRILOSEC) 20 MG capsule Take 1 capsule (20 mg total) by mouth daily.  30 capsule  2  . furosemide (LASIX) 20 MG tablet Take 1 tablet (20 mg total) by mouth daily.  30 tablet  0  . PARoxetine (  PAXIL) 10 MG tablet Take 1 tablet (10 mg total) by mouth daily.  30 tablet  5  . potassium chloride (K-DUR) 10 MEQ tablet Take 1 tablet (10 mEq total) by mouth daily.  30 tablet  0   No current facility-administered medications for this visit.     Allergies  Allergen Reactions  . Codeine Nausea And Vomiting  . Lactose Intolerance (Gi)   . Tequin     Severe stomach pain  . Penicillins Nausea Only and Rash     Family History  Problem Relation Age of Onset  . Colon cancer Neg Hx   . Aneurysm Mother   . Stroke Mother   . Prostate cancer Father   . Skin cancer Brother   . Heart failure Paternal Aunt   . Cancer Paternal Aunt     breast  . Pancreatic cancer Maternal Grandmother   . Cancer Maternal Aunt     breast     History   Social History  . Marital Status: Married    Spouse Name: N/A    Number of Children: 2  . Years of Education: N/A   Occupational History  . Self employed     Social History Main Topics  . Smoking status: Former Smoker -- 0.50 packs/day for 18 years    Types: Cigarettes    Quit date: 11/02/2000  . Smokeless tobacco:  Never Used  . Alcohol Use: No     Comment: 1-2 alcohol drinks/day- NONE AS OF 11-20-2013  . Drug Use: No  . Sexual Activity: None   Other Topics Concern  . None   Social History Narrative   2 caffeine drinks daily      REVIEW OF SYSTEMS - PERTINENT POSITIVES ONLY: 12 point review of systems negative other than HPI and PMH except for fatigue.  EXAMDanley Danker Vitals:   12/04/13 0958  BP: 136/74  Pulse: 64  Temp: 98.4 F (36.9 C)  Resp: 14    Wt Readings from Last 3 Encounters:  12/04/13 156 lb 3.2 oz (70.852 kg)  11/30/13 158 lb 3.2 oz (71.759 kg)  11/28/13 160 lb (72.576 kg)     Gen:  No acute distress.  Well nourished and well groomed.   Neurological: Alert and oriented to person, place, and time. Coordination normal.  Head: Normocephalic and atraumatic.  Eyes: Conjunctivae are normal. Pupils are equal, round, and reactive to light. No scleral icterus.  Neck: Normal range of motion. Neck supple. No tracheal deviation or thyromegaly present.  Cardiovascular: Normal rate, regular rhythm, normal heart sounds and intact distal pulses.  Exam reveals no gallop and no friction rub.  No murmur heard. Respiratory: Effort normal.  No respiratory distress. No chest wall tenderness. Breath sounds normal.  No wheezes, rales or rhonchi.  GI: Soft. Bowel sounds are normal. The abdomen is soft and nontender.  There is no rebound and no guarding.  Musculoskeletal: Normal range of motion. Extremities are nontender.  Lymphadenopathy: No cervical, preauricular, postauricular or axillary adenopathy is present Skin: Skin is warm and dry. No rash noted. No diaphoresis. No erythema. No pallor. No clubbing, cyanosis, or edema.   Psychiatric: Normal mood and affect. Behavior is normal. Judgment and thought content normal.    LABORATORY RESULTS: Available labs are reviewed   Recent Results (from the past 2160 hour(s))  SEDIMENTATION RATE     Status: Abnormal   Collection Time    09/26/13  4:20  PM      Result Value Range  Sed Rate 42 (*) 0 - 22 mm/hr  AMYLASE     Status: Abnormal   Collection Time    09/26/13  4:20 PM      Result Value Range   Amylase 237 (*) 27 - 131 U/L  LIPASE     Status: Abnormal   Collection Time    09/26/13  4:20 PM      Result Value Range   Lipase 513.0 Verified by manual dilution. (*) 11.0 - 59.0 U/L  AMYLASE     Status: None   Collection Time    10/20/13  3:26 PM      Result Value Range   Amylase 87  27 - 131 U/L  LIPASE     Status: Abnormal   Collection Time    10/20/13  3:26 PM      Result Value Range   Lipase 76.0 (*) 11.0 - 59.0 U/L  HEPATIC FUNCTION PANEL     Status: None   Collection Time    10/20/13  3:26 PM      Result Value Range   Total Bilirubin 0.6  0.3 - 1.2 mg/dL   Bilirubin, Direct 0.1  0.0 - 0.3 mg/dL   Alkaline Phosphatase 55  39 - 117 U/L   AST 23  0 - 37 U/L   ALT 23  0 - 35 U/L   Total Protein 7.5  6.0 - 8.3 g/dL   Albumin 3.6  3.5 - 5.2 g/dL  CBC WITH DIFFERENTIAL     Status: Abnormal   Collection Time    10/20/13  3:26 PM      Result Value Range   WBC 10.4  4.5 - 10.5 K/uL   RBC 4.31  3.87 - 5.11 Mil/uL   Hemoglobin 13.1  12.0 - 15.0 g/dL   HCT 37.8  36.0 - 46.0 %   MCV 87.8  78.0 - 100.0 fl   MCHC 34.8  30.0 - 36.0 g/dL   RDW 12.9  11.5 - 14.6 %   Platelets 448.0 (*) 150.0 - 400.0 K/uL   Neutrophils Relative % 70.7  43.0 - 77.0 %   Lymphocytes Relative 17.7  12.0 - 46.0 %   Monocytes Relative 8.5  3.0 - 12.0 %   Eosinophils Relative 2.6  0.0 - 5.0 %   Basophils Relative 0.5  0.0 - 3.0 %   Neutro Abs 7.3  1.4 - 7.7 K/uL   Lymphs Abs 1.8  0.7 - 4.0 K/uL   Monocytes Absolute 0.9  0.1 - 1.0 K/uL   Eosinophils Absolute 0.3  0.0 - 0.7 K/uL   Basophils Absolute 0.0  0.0 - 0.1 K/uL  ANA     Status: None   Collection Time    10/24/13  4:14 PM      Result Value Range   ANA NEG  NEGATIVE  IGG     Status: None   Collection Time    10/24/13  4:14 PM      Result Value Range   IgG (Immunoglobin G), Serum  1000  690 - 1700 mg/dL  CBC WITH DIFFERENTIAL     Status: Abnormal   Collection Time    11/13/13  3:11 PM      Result Value Range   WBC 12.5 (*) 4.5 - 10.5 K/uL   RBC 4.52  3.87 - 5.11 Mil/uL   Hemoglobin 13.5  12.0 - 15.0 g/dL   HCT 38.7  36.0 - 46.0 %   MCV 85.8  78.0 - 100.0  fl   MCHC 34.9  30.0 - 36.0 g/dL   RDW 13.4  11.5 - 14.6 %   Platelets 395.0  150.0 - 400.0 K/uL   Neutrophils Relative % 75.8  43.0 - 77.0 %   Lymphocytes Relative 14.7  12.0 - 46.0 %   Monocytes Relative 5.3  3.0 - 12.0 %   Eosinophils Relative 3.2  0.0 - 5.0 %   Basophils Relative 1.0  0.0 - 3.0 %   Neutro Abs 9.5 (*) 1.4 - 7.7 K/uL   Lymphs Abs 1.8  0.7 - 4.0 K/uL   Monocytes Absolute 0.7  0.1 - 1.0 K/uL   Eosinophils Absolute 0.4  0.0 - 0.7 K/uL   Basophils Absolute 0.1  0.0 - 0.1 K/uL  COMPREHENSIVE METABOLIC PANEL     Status: Abnormal   Collection Time    11/13/13  3:11 PM      Result Value Range   Sodium 138  135 - 145 mEq/L   Potassium 4.3  3.5 - 5.1 mEq/L   Chloride 102  96 - 112 mEq/L   CO2 28  19 - 32 mEq/L   Glucose, Bld 108 (*) 70 - 99 mg/dL   BUN 14  6 - 23 mg/dL   Creatinine, Ser 0.9  0.4 - 1.2 mg/dL   Total Bilirubin 0.5  0.3 - 1.2 mg/dL   Alkaline Phosphatase 60  39 - 117 U/L   AST 22  0 - 37 U/L   ALT 23  0 - 35 U/L   Total Protein 7.4  6.0 - 8.3 g/dL   Albumin 3.7  3.5 - 5.2 g/dL   Calcium 9.6  8.4 - 10.5 mg/dL   GFR 69.35  >60.00 mL/min  LIPASE     Status: Abnormal   Collection Time    11/13/13  3:11 PM      Result Value Range   Lipase 98.0 (*) 11.0 - 59.0 U/L  AMYLASE     Status: None   Collection Time    11/13/13  3:11 PM      Result Value Range   Amylase 110  27 - 131 U/L  LIPASE     Status: Abnormal   Collection Time    11/13/13  3:11 PM      Result Value Range   Lipase 96.0 (*) 11.0 - 59.0 U/L  CBC WITH DIFFERENTIAL     Status: Abnormal   Collection Time    11/28/13  4:35 PM      Result Value Range   WBC 10.4  4.0 - 10.5 K/uL   RBC 4.25  3.87 - 5.11 MIL/uL    Hemoglobin 12.8  12.0 - 15.0 g/dL   HCT 35.5 (*) 36.0 - 46.0 %   MCV 83.5  78.0 - 100.0 fL   MCH 30.1  26.0 - 34.0 pg   MCHC 36.1 (*) 30.0 - 36.0 g/dL   RDW 13.9  11.5 - 15.5 %   Platelets 391  150 - 400 K/uL   Neutrophils Relative % 73  43 - 77 %   Neutro Abs 7.5  1.7 - 7.7 K/uL   Lymphocytes Relative 15  12 - 46 %   Lymphs Abs 1.6  0.7 - 4.0 K/uL   Monocytes Relative 10  3 - 12 %   Monocytes Absolute 1.0  0.1 - 1.0 K/uL   Eosinophils Relative 2  0 - 5 %   Eosinophils Absolute 0.3  0.0 - 0.7 K/uL   Basophils Relative  0  0 - 1 %   Basophils Absolute 0.0  0.0 - 0.1 K/uL   Smear Review Criteria for review not met    COMPREHENSIVE METABOLIC PANEL     Status: Abnormal   Collection Time    11/28/13  4:35 PM      Result Value Range   Sodium 139  135 - 145 mEq/L   Potassium 3.5  3.5 - 5.3 mEq/L   Chloride 100  96 - 112 mEq/L   CO2 29  19 - 32 mEq/L   Glucose, Bld 110 (*) 70 - 99 mg/dL   BUN 10  6 - 23 mg/dL   Creat 0.62  0.50 - 1.10 mg/dL   Total Bilirubin 0.4  0.3 - 1.2 mg/dL   Comment: ** Please note change in reference range(s). **   Alkaline Phosphatase 64  39 - 117 U/L   AST 17  0 - 37 U/L   ALT 14  0 - 35 U/L   Total Protein 6.4  6.0 - 8.3 g/dL   Albumin 3.5  3.5 - 5.2 g/dL   Calcium 9.1  8.4 - 10.5 mg/dL  LIPASE     Status: Abnormal   Collection Time    11/28/13  4:35 PM      Result Value Range   Lipase 110 (*) 0 - 75 U/L  AMYLASE     Status: None   Collection Time    11/28/13  4:35 PM      Result Value Range   Amylase 47  0 - 105 U/L  CANCER ANTIGEN 19-9     Status: Abnormal   Collection Time    11/29/13  3:33 PM      Result Value Range   CA 19-9 47.0 (*) <35.0 U/mL  ALBUMIN, FLUID     Status: None   Collection Time    12/01/13 11:50 AM      Result Value Range   Albumin, Fluid 2.3     Comment: NO NORMAL RANGE ESTABLISHED FOR THIS TEST   Fluid Type-FALB PLEURAL     Comment: FLUID     LEFT  AMYLASE, PLEURAL FLUID     Status: None   Collection Time     12/01/13 11:50 AM      Result Value Range   Amylase, Pleural Fluid 80     Comment: (NOTE)     Reference range:     Values are considered abnormal if they are greater than or equal to     two times a simultaneously analyzed serum value.     Performed at [place] DEHYDROGENASE, BODY FLUID     Status: Abnormal   Collection Time    12/01/13 11:50 AM      Result Value Range   LD, Fluid 128 (*) 3 - 23 U/L   Fluid Type-FLDH PLEURAL     Comment: FLUID     LEFT  BODY FLUID CULTURE     Status: None   Collection Time    12/01/13 11:50 AM      Result Value Range   Specimen Description PLEURAL LEFT FLUID     Special Requests 60ML FLUID     Gram Stain       Value: MODERATE WBC PRESENT,BOTH PMN AND MONONUCLEAR     NO ORGANISMS SEEN     Performed at [facility]   Culture       Value: NO GROWTH 4 DAYS  Performed at [facility]   Report Status 12/05/2013 FINAL       RADIOLOGY RESULTS: See E-Chart or I-Site for most recent results.  Images and reports are reviewed.  Dg Chest 1 View  12/01/2013   CLINICAL DATA:  Left pleural effusion. Immediately status post left thoracentesis.  EXAM: CHEST - 1 VIEW  COMPARISON:  Chest x-ray dated 11/28/2013  FINDINGS: There has been almost complete elimination of the left pleural effusion. Minimal residual fluid and atelectasis at the left lung base. No pneumothorax. Right lung is clear. Heart size and vascularity are normal. No osseous abnormality.  IMPRESSION: No pneumothorax after thoracentesis. Minimal residual fluid and atelectasis at the left base.   Electronically Signed   By: Rozetta Nunnery M.D.   On: 12/01/2013 12:18   Dg Chest 2 View  11/28/2013   CLINICAL DATA:  Endoscopic exam 4 days ago, sore throat and fever since  EXAM: CHEST  2 VIEW  COMPARISON:  08/22/2013  FINDINGS: The heart size and vascular pattern are normal. The right lung is clear. On the left side, there is a small to moderate pleural effusion with  underlying consolidation. This is not seen on the prior study.  IMPRESSION: Small to moderate left effusion with underlying consolidation. Pneumonitis is not excluded.   Electronically Signed   By: Skipper Cliche M.D.   On: 11/28/2013 17:07   Ct Chest W Contrast  11/29/2013   CLINICAL DATA:  Newly diagnosed pancreatic cancer. New left pleural effusion.  EXAM: CT CHEST WITH CONTRAST  TECHNIQUE: Multidetector CT imaging of the chest was performed during intravenous contrast administration.  CONTRAST:  42m OMNIPAQUE IOHEXOL 300 MG/ML  SOLN  COMPARISON:  CT of the abdomen and pelvis 11/14/2013.  FINDINGS: Mediastinum: Heart size is normal. There is no significant pericardial fluid, thickening or pericardial calcification. No pathologically enlarged mediastinal hilar lymph nodes. The esophagus is unremarkable in appearance.  Lungs/Pleura: New moderate left-sided pleural effusion layering dependently with associated passive atelectasis in the left lower lobe basal segments and inferior segment of the lingula. No acute consolidative airspace disease. No definite suspicious appearing pulmonary nodules or masses.  Upper Abdomen: Complex cystic lesion emanating from the tail of the pancreas measuring approximately 5.3 x 4.0 x 5.5 cm, extending into the gastrosplenic ligament. Compared to the recent prior examination from 11/14/2013, there is increasing low-attenuation fluid associated with the splenic capsule, which may be reactive, or may be related to extension from the mass in the tail of the pancreas, as this does have a similar appearance to the primary lesion. This extends underneath the left hemidiaphragm. Mild fat stranding is noted adjacent to the tail of the pancreas and in the upper left retroperitoneum, potentially related to inflammation or lymphatic congestion. Geographic low attenuation throughout the right lobe of the liver as compared to the left is favored to represent regional fatty infiltration.   Musculoskeletal: There are no aggressive appearing lytic or blastic lesions noted in the visualized portions of the skeleton.  IMPRESSION: 1. New moderate left pleural effusion with extensive passive atelectasis in the left lung base, as above. 2. No definite signs of metastatic disease to the thorax at this time. 3. Slight interval enlargement of cystic mass in the tail of the pancreas, which now appears likely associated with some subcapsular spread along the posterior and superior aspect of the spleen. Alternatively, the subcapsular splenic fluid collections could be simply reactive fluid or pseudocysts. 4. Probable geographic fatty infiltration of the right lobe of  the liver.   Electronically Signed   By: Vinnie Langton M.D.   On: 11/29/2013 14:23   US Abdomen Complete  11/13/2013   CLINICAL DATA:  Known pancreatic cyst now for follow-up. The patient is reporting left-sided abdominal pain. History of previous pancreatitis and splenic infarction.  EXAM: ULTRASOUND ABDOMEN COMPLETE  COMPARISON:  MRI of the abdomen dated September 03, 2013.  FINDINGS: Gallbladder:  The gallbladder is adequately distended. There are multiple small mobile stones with distal shadowing. There is no gallbladder wall thickening or pericholecystic fluid. There is no positive sonographic Murphy's sign. Next item next  Common bile duct:  Diameter: 2.5 mm  Liver:  No focal lesion identified. Within normal limits in parenchymal echogenicity.  IVC:  No abnormality visualized.  Pancreas:  There is no focal cystic or solid appearing pancreatic mass. There is no pancreatic ductal dilation.  Spleen:  The is spleen is not enlarged. In the splenic hilum there is a hypoechoic focus which is been previously demonstrated and may reflect a lesion in the adjacent pancreatic tail or be related to other structures in the splenic hilum.  Right Kidney:  Length: 10.7 cm. Echogenicity within normal limits. No mass or hydronephrosis visualized.  Left Kidney:   Length: 11.8 cm. Echogenicity within normal limits. No mass or hydronephrosis visualized.  Abdominal aorta:  No aneurysm visualized.  Other findings:  No ascites is demonstrated  IMPRESSION: 1. There are gallstones present without evidence of acute cholecystitis. 2. In the region of the pancreatic tail there is a hypoechoic lobulated appearing focus measuring up to 7.4 cm which is nonspecific. This is the area of abnormalities on the previous MRI. If the patient is having acute symptoms or has laboratory abnormalities, follow-up MRI or abdominal CT scan would be useful. 3. There is no acute abnormality of the liver or kidneys or abdominal aorta or inferior vena cava.   Electronically Signed   By: David  Martinique   On: 11/13/2013 09:14   Ct Abdomen Pelvis W Contrast  11/14/2013   CLINICAL DATA:  Cystic pancreatic lesion seen on ultrasound. Pancreatitis. Cholelithiasis.  EXAM: CT ABDOMEN AND PELVIS WITH CONTRAST  TECHNIQUE: Multidetector CT imaging of the abdomen and pelvis was performed using the standard protocol following bolus administration of intravenous contrast.  CONTRAST:  80 mL Omnipaque 300  COMPARISON:  MRI on 09/03/2013 and CT on 08/22/2013  FINDINGS: Diffuse pancreatic atrophy and mild pancreatic ductal dilatation are again seen, without evidence of pancreatic mass. This is consistent with chronic pancreatitis. There is mild soft tissue stranding surrounding the pancreatic body and tail, suspicious for mild superimposed acute pancreatitis. New rim enhancing fluid collection is seen in the gastrosplenic ligament which measures 3.7 x 4.5 cm, consistent with pancreatic pseudocysts.  No evidence of cholecystitis or biliary dilatation. No liver masses are identified. The spleen is normal in size and appearance. Adrenal glands and kidneys are also normal in appearance. No evidence of hydronephrosis.  No soft tissue masses identified within the abdomen or pelvis. Uterus and adnexal regions are unremarkable in  appearance. No evidence of other inflammatory process or abnormal fluid collections within the pelvis. No evidence of bowel wall thickening or obstruction.  IMPRESSION: Findings suspicious for mild acute on chronic pancreatitis.  New 4.5 cm rim enhancing fluid collection in gastrosplenic ligament, consistent with pancreatic pseudocyst.   Electronically Signed   By: Earle Gell M.D.   On: 11/14/2013 16:14   US Thoracentesis Asp Pleural Space W/img Guide  12/01/2013   CLINICAL  DATA:  Carcinoma of the pancreas, left pleural effusion, shortness of breath, request for diagnostic and therapeutic thoracentesis.  EXAM: ULTRASOUND GUIDED left THORACENTESIS  COMPARISON:  None.  FINDINGS: A total of approximately 400 ml of serous fluid was removed. A fluid sample was sent for laboratory analysis.  IMPRESSION: Successful ultrasound guided left thoracentesis yielding 400 ml of pleural fluid.  Read By:  Tsosie Billing PA-C  PROCEDURE: An ultrasound guided thoracentesis was thoroughly discussed with the patient and questions answered. The benefits, risks, alternatives and complications were also discussed. The patient understands and wishes to proceed with the procedure. Written consent was obtained.  Ultrasound was performed to localize and mark an adequate pocket of fluid in the left chest. The area was then prepped and draped in the normal sterile fashion. 1% Lidocaine was used for local anesthesia. Under ultrasound guidance a 19 gauge Yueh catheter was introduced. Thoracentesis was performed. The catheter was removed and a dressing applied.  Complications:  none.   Electronically Signed   By: Aletta Edouard M.D.   On: 12/01/2013 12:17      ASSESSMENT AND PLAN: Carcinoma of pancreas We will await cytology from the thoracentesis. If negative, will plan diagnostic laparoscopy and open distal pancreatectomy/splenectomy.  Pt will also need cholecystectomy.  Will abort open resection if peritoneal mets are seen, but  would still do cholecystectomy and place port. Pt will need to see genetics since she has personal history of breast cancer and first degree relative with prostate cancer in addition to her diagnosis of pancreatic cancer.   I discussed that due to the location of the mass, we would proceed with open resection.  The mass is directly over the confluence of the vessels.  I think we would be much less likely to get good margins if we attempted to do laparoscopically.  I discussed the surgery with the patient including diagrams of anatomy.  I discussed the potential for diagnostic laparoscopy.  In the case of pancreatic cancer, if spread of the disease is found, we will abort the procedure and not proceed with resection.  The rationale for this was discussed with the patient.  There has not been data to support resection of Stage IV disease in terms of survival benefit.    We discussed possible complications including: Bleeding Infection and possible wound complications such as hernia Damage to adjacent structures Leak of the pancreas Possible need for other procedures Possible prolonged hospital stay Possible development of diabetes or worsening of current diabetes.  Possible pancreatic exocrine insufficiency Prolonged fatigue/weakness/appetite Difficulty with eating or post operative nausea Possible early recurrence of cancer   The patient understands and wishes to proceed.  The patient has been advised to turn in disability paperwork to our office.    45 min spent with exam and counseling.  >50% spent in counseling.        Milus Height MD Surgical On[facility]      Visit Diagnoses: 1. Peripheral edema   2. Carcinoma of pancreas     Primary Care Physician: [name], MD

## 2013-12-08 ENCOUNTER — Telehealth: Payer: Self-pay | Admitting: *Deleted

## 2013-12-08 NOTE — Telephone Encounter (Signed)
Message left by pt stating need to reschedule appt due to surgical date at end of Feb- she needs to see Dr Jannifer Rodney approximately 2 weeks post surgery.  This RN noted surgery scheduled for 2/20- POF sent to reschedule appointment.

## 2013-12-11 ENCOUNTER — Telehealth: Payer: Self-pay | Admitting: Oncology

## 2013-12-11 ENCOUNTER — Encounter (INDEPENDENT_AMBULATORY_CARE_PROVIDER_SITE_OTHER): Payer: Self-pay | Admitting: General Surgery

## 2013-12-11 NOTE — Telephone Encounter (Signed)
s.w. pt and gv new d.t for lab and est per GM request...pt ok and aware

## 2013-12-12 ENCOUNTER — Encounter (HOSPITAL_COMMUNITY): Payer: Self-pay | Admitting: Pharmacy Technician

## 2013-12-13 NOTE — Pre-Procedure Instructions (Signed)
Annette Beltran  12/13/2013  l Your procedure is scheduled on:  Wednesday, December 20, 2013 at 11:30 AM  Report to [facility] Short Stay (use Main Entrance "A'') at 9:30 AM.  Call this number if you have problems the morning of surgery: [phone]   Remember:   Do not eat food or drink liquids after midnight.   Take these medicines the morning of surgery with A SIP OF WATER: ALPRAZolam (XANAX), loratadine (CLARITIN, omeprazole (PRILOSEC) 20 MG capsule, PARoxetine (PAXIL) 10 MG tablet, If needed:meperidine (DEMEROL) 50 MG tablet moderate pain or severe pain. Stop taking Aspirin, vitamins and herbal medications. Do not take any NSAIDs ie: Ibuprofen, Advil, Naproxen or any medication containing Aspirin.   Do not wear jewelry, make-up or nail polish.  Do not wear lotions, powders, or perfumes.   Do not shave 48 hours prior to surgery.   Do not bring valuables to the hospital.  [facility] is not responsible for any belongings or valuables.               Contacts, dentures or bridgework may not be worn into surgery.  Leave suitcase in the car. After surgery it may be brought to your room.  For patients admitted to the hospital, discharge time is determined by your treatment team.               Patients discharged the day of surgery will not be allowed to drive home.  Name and phone number of your driver:   Special Instructions:  Special Instructions:Special Instructions: [facility] - Preparing for Surgery  Before surgery, you can play an important role.  Because skin is not sterile, your skin needs to be as free of germs as possible.  You can reduce the number of germs on you skin by washing with CHG (chlorahexidine gluconate) soap before surgery.  CHG is an antiseptic cleaner which kills germs and bonds with the skin to continue killing germs even after washing.  Please DO NOT use if you have an allergy to CHG or antibacterial soaps.  If your skin becomes reddened/irritated stop using the  CHG and inform your nurse when you arrive at Short Stay.  Do not shave (including legs and underarms) for at least 48 hours prior to the first CHG shower.  You may shave your face.  Please follow these instructions carefully:   1.  Shower with CHG Soap the night before surgery and the morning of Surgery.  2.  If you choose to wash your hair, wash your hair first as usual with your normal shampoo.  3.  After you shampoo, rinse your hair and body thoroughly to remove the Shampoo.  4.  Use CHG as you would any other liquid soap.  You can apply chg directly  to the skin and wash gently with scrungie or a clean washcloth.  5.  Apply the CHG Soap to your body ONLY FROM THE NECK DOWN.  Do not use on open wounds or open sores.  Avoid contact with your eyes, ears, mouth and genitals (private parts).  Wash genitals (private parts) with your normal soap.  6.  Wash thoroughly, paying special attention to the area where your surgery will be performed.  7.  Thoroughly rinse your body with warm water from the neck down.  8.  DO NOT shower/wash with your normal soap after using and rinsing off the CHG Soap.  9.  Pat yourself dry with a clean towel.  10.  Wear clean pajamas.            11.  Place clean sheets on your bed the night of your first shower and do not sleep with pets.  Day of Surgery  Do not apply any lotions/deodorants the morning of surgery.  Please wear clean clothes to the hospital/surgery center.   Please read over the following fact sheets that you were given: Pain Booklet, Coughing and Deep Breathing and Surgical Site Infection Prevention

## 2013-12-14 ENCOUNTER — Encounter (HOSPITAL_COMMUNITY): Payer: Self-pay

## 2013-12-14 ENCOUNTER — Encounter (HOSPITAL_COMMUNITY)
Admission: RE | Admit: 2013-12-14 | Discharge: 2013-12-14 | Disposition: A | Discharge status: Home / Self Care | Payer: Medicare HMO | Source: Ambulatory Visit | Attending: General Surgery | Admitting: General Surgery

## 2013-12-14 DIAGNOSIS — Z01812 Encounter for preprocedural laboratory examination: Secondary | ICD-10-CM | POA: Insufficient documentation

## 2013-12-14 LAB — BASIC METABOLIC PANEL
BUN: 11 mg/dL (ref 6–23)
CO2: 28 mEq/L (ref 19–32)
Calcium: 9.5 mg/dL (ref 8.4–10.5)
Chloride: 100 mEq/L (ref 96–112)
Creatinine, Ser: 0.69 mg/dL (ref 0.50–1.10)
GFR calc Af Amer: >90 mL/min (ref >90)
GFR, EST NON AFRICAN AMERICAN: 87 mL/min — AB (ref >90)
Glucose, Bld: 85 mg/dL (ref 70–99)
POTASSIUM: 4.1 meq/L (ref 3.7–5.3)
Sodium: 143 mEq/L (ref 137–147)

## 2013-12-14 LAB — CBC
HEMATOCRIT: 37.6 % (ref 36.0–46.0)
HEMOGLOBIN: 13.5 g/dL (ref 12.0–15.0)
MCH: 29.9 pg (ref 26.0–34.0)
MCHC: 35.9 g/dL (ref 30.0–36.0)
MCV: 83.4 fL (ref 78.0–100.0)
Platelets: 361 10*3/uL (ref 150–400)
RBC: 4.51 MIL/uL (ref 3.87–5.11)
RDW: 13.9 % (ref 11.5–15.5)
WBC: 8.6 10*3/uL (ref 4.0–10.5)

## 2013-12-15 ENCOUNTER — Ambulatory Visit: Payer: Medicare Other | Admitting: Internal Medicine

## 2013-12-20 ENCOUNTER — Encounter (HOSPITAL_COMMUNITY): Payer: Medicare HMO | Admitting: Anesthesiology

## 2013-12-20 ENCOUNTER — Inpatient Hospital Stay (HOSPITAL_COMMUNITY): Payer: Medicare HMO | Admitting: Anesthesiology

## 2013-12-20 ENCOUNTER — Encounter (HOSPITAL_COMMUNITY): Payer: Self-pay | Admitting: Anesthesiology

## 2013-12-20 ENCOUNTER — Encounter (HOSPITAL_COMMUNITY)
Admission: RE | Disposition: A | Discharge status: Home / Self Care | Payer: Self-pay | Source: Ambulatory Visit | Attending: General Surgery

## 2013-12-20 ENCOUNTER — Inpatient Hospital Stay (HOSPITAL_COMMUNITY)
Admission: RE | Admit: 2013-12-20 | Discharge: 2013-12-27 | DRG: 406 | Disposition: A | Discharge status: Home / Self Care | Payer: Medicare HMO | Source: Ambulatory Visit | Attending: General Surgery | Admitting: General Surgery

## 2013-12-20 DIAGNOSIS — K8010 Calculus of gallbladder with chronic cholecystitis without obstruction: Secondary | ICD-10-CM

## 2013-12-20 DIAGNOSIS — J9819 Other pulmonary collapse: Secondary | ICD-10-CM | POA: Diagnosis not present

## 2013-12-20 DIAGNOSIS — K811 Chronic cholecystitis: Secondary | ICD-10-CM | POA: Diagnosis present

## 2013-12-20 DIAGNOSIS — C259 Malignant neoplasm of pancreas, unspecified: Secondary | ICD-10-CM | POA: Diagnosis present

## 2013-12-20 DIAGNOSIS — Z880 Allergy status to penicillin: Secondary | ICD-10-CM

## 2013-12-20 DIAGNOSIS — Z79899 Other long term (current) drug therapy: Secondary | ICD-10-CM

## 2013-12-20 DIAGNOSIS — K861 Other chronic pancreatitis: Secondary | ICD-10-CM | POA: Diagnosis present

## 2013-12-20 DIAGNOSIS — Z853 Personal history of malignant neoplasm of breast: Secondary | ICD-10-CM

## 2013-12-20 DIAGNOSIS — K863 Pseudocyst of pancreas: Secondary | ICD-10-CM

## 2013-12-20 DIAGNOSIS — H3530 Unspecified macular degeneration: Secondary | ICD-10-CM | POA: Diagnosis present

## 2013-12-20 DIAGNOSIS — Z6827 Body mass index (BMI) 27.0-27.9, adult: Secondary | ICD-10-CM

## 2013-12-20 DIAGNOSIS — D62 Acute posthemorrhagic anemia: Secondary | ICD-10-CM | POA: Diagnosis not present

## 2013-12-20 DIAGNOSIS — I959 Hypotension, unspecified: Secondary | ICD-10-CM | POA: Diagnosis not present

## 2013-12-20 DIAGNOSIS — E440 Moderate protein-calorie malnutrition: Secondary | ICD-10-CM | POA: Insufficient documentation

## 2013-12-20 DIAGNOSIS — Z7982 Long term (current) use of aspirin: Secondary | ICD-10-CM

## 2013-12-20 DIAGNOSIS — R7309 Other abnormal glucose: Secondary | ICD-10-CM | POA: Diagnosis present

## 2013-12-20 DIAGNOSIS — K862 Cyst of pancreas: Secondary | ICD-10-CM | POA: Diagnosis present

## 2013-12-20 DIAGNOSIS — K560 Paralytic ileus: Secondary | ICD-10-CM | POA: Diagnosis not present

## 2013-12-20 DIAGNOSIS — Z800 Family history of malignant neoplasm of digestive organs: Secondary | ICD-10-CM

## 2013-12-20 DIAGNOSIS — R0902 Hypoxemia: Secondary | ICD-10-CM | POA: Diagnosis present

## 2013-12-20 DIAGNOSIS — C251 Malignant neoplasm of body of pancreas: Principal | ICD-10-CM | POA: Diagnosis present

## 2013-12-20 DIAGNOSIS — Z808 Family history of malignant neoplasm of other organs or systems: Secondary | ICD-10-CM

## 2013-12-20 DIAGNOSIS — Z888 Allergy status to other drugs, medicaments and biological substances status: Secondary | ICD-10-CM

## 2013-12-20 DIAGNOSIS — R609 Edema, unspecified: Secondary | ICD-10-CM | POA: Diagnosis present

## 2013-12-20 DIAGNOSIS — Z87891 Personal history of nicotine dependence: Secondary | ICD-10-CM

## 2013-12-20 DIAGNOSIS — E876 Hypokalemia: Secondary | ICD-10-CM | POA: Diagnosis not present

## 2013-12-20 DIAGNOSIS — F411 Generalized anxiety disorder: Secondary | ICD-10-CM | POA: Diagnosis present

## 2013-12-20 DIAGNOSIS — M810 Age-related osteoporosis without current pathological fracture: Secondary | ICD-10-CM | POA: Diagnosis present

## 2013-12-20 DIAGNOSIS — E559 Vitamin D deficiency, unspecified: Secondary | ICD-10-CM | POA: Diagnosis present

## 2013-12-20 DIAGNOSIS — Z823 Family history of stroke: Secondary | ICD-10-CM

## 2013-12-20 DIAGNOSIS — Z885 Allergy status to narcotic agent status: Secondary | ICD-10-CM

## 2013-12-20 HISTORY — PX: CHOLECYSTECTOMY: SHX55

## 2013-12-20 HISTORY — PX: LAPAROSCOPY: SHX197

## 2013-12-20 HISTORY — PX: SPLENECTOMY, TOTAL: SHX788

## 2013-12-20 HISTORY — PX: PARTIAL GASTRECTOMY: SHX6003

## 2013-12-20 LAB — POCT I-STAT 7, (LYTES, BLD GAS, ICA,H+H)
Acid-base deficit: 3 mmol/L — ABNORMAL HIGH (ref 0.0–2.0)
BICARBONATE: 21.4 meq/L (ref 20.0–24.0)
Bicarbonate: 24.9 mEq/L — ABNORMAL HIGH (ref 20.0–24.0)
CALCIUM ION: 1.12 mmol/L — AB (ref 1.13–1.30)
Calcium, Ion: 1.03 mmol/L — ABNORMAL LOW (ref 1.13–1.30)
HCT: 30 % — ABNORMAL LOW (ref 36.0–46.0)
HEMATOCRIT: 20 % — AB (ref 36.0–46.0)
HEMOGLOBIN: 10.2 g/dL — AB (ref 12.0–15.0)
Hemoglobin: 6.8 g/dL — CL (ref 12.0–15.0)
O2 Saturation: 100 %
O2 Saturation: 100 %
PCO2 ART: 38.3 mmHg (ref 35.0–45.0)
PH ART: 7.415 (ref 7.350–7.450)
POTASSIUM: 3.5 meq/L — AB (ref 3.7–5.3)
Patient temperature: 35.6
Potassium: 2.6 mEq/L — CL (ref 3.7–5.3)
SODIUM: 144 meq/L (ref 137–147)
Sodium: 140 mEq/L (ref 137–147)
TCO2: 22 mmol/L (ref 0–100)
TCO2: 26 mmol/L (ref 0–100)
pCO2 arterial: 29.9 mmHg — ABNORMAL LOW (ref 35.0–45.0)
pH, Arterial: 7.456 — ABNORMAL HIGH (ref 7.350–7.450)
pO2, Arterial: 330 mmHg — ABNORMAL HIGH (ref 80.0–100.0)
pO2, Arterial: 327 mmHg — ABNORMAL HIGH (ref 80.0–100.0)

## 2013-12-20 LAB — CBC
HCT: 23.2 % — ABNORMAL LOW (ref 36.0–46.0)
HCT: 32.2 % — ABNORMAL LOW (ref 36.0–46.0)
HEMOGLOBIN: 11.3 g/dL — AB (ref 12.0–15.0)
Hemoglobin: 8.2 g/dL — ABNORMAL LOW (ref 12.0–15.0)
MCH: 29.3 pg (ref 26.0–34.0)
MCH: 28.8 pg (ref 26.0–34.0)
MCHC: 35.3 g/dL (ref 30.0–36.0)
MCHC: 35.1 g/dL (ref 30.0–36.0)
MCV: 82.9 fL (ref 78.0–100.0)
MCV: 82.1 fL (ref 78.0–100.0)
Platelets: 230 10*3/uL (ref 150–400)
Platelets: 228 10*3/uL (ref 150–400)
RBC: 2.8 MIL/uL — ABNORMAL LOW (ref 3.87–5.11)
RBC: 3.92 MIL/uL (ref 3.87–5.11)
RDW: 14.1 % (ref 11.5–15.5)
RDW: 15.6 % — AB (ref 11.5–15.5)
WBC: 21.5 10*3/uL — ABNORMAL HIGH (ref 4.0–10.5)
WBC: 20.4 10*3/uL — ABNORMAL HIGH (ref 4.0–10.5)

## 2013-12-20 LAB — MAGNESIUM: Magnesium: 1.4 mg/dL — ABNORMAL LOW (ref 1.5–2.5)

## 2013-12-20 LAB — BASIC METABOLIC PANEL
BUN: 12 mg/dL (ref 6–23)
CHLORIDE: 105 meq/L (ref 96–112)
CO2: 24 mEq/L (ref 19–32)
CREATININE: 0.5 mg/dL (ref 0.50–1.10)
Calcium: 7.8 mg/dL — ABNORMAL LOW (ref 8.4–10.5)
GFR calc non Af Amer: >90 mL/min (ref >90)
GLUCOSE: 193 mg/dL — AB (ref 70–99)
Potassium: 3.9 mEq/L (ref 3.7–5.3)
Sodium: 141 mEq/L (ref 137–147)

## 2013-12-20 LAB — PREPARE RBC (CROSSMATCH)

## 2013-12-20 LAB — ABO/RH: ABO/RH(D): A POS

## 2013-12-20 LAB — PROTIME-INR
INR: 1.24 (ref 0.00–1.49)
Prothrombin Time: 15.3 seconds — ABNORMAL HIGH (ref 11.6–15.2)

## 2013-12-20 SURGERY — LAPAROSCOPY, DIAGNOSTIC
Anesthesia: General

## 2013-12-20 MED ORDER — ROCURONIUM BROMIDE 50 MG/5ML IV SOLN
INTRAVENOUS | Status: AC
  Filled 2013-12-20: qty 1

## 2013-12-20 MED ORDER — BUPIVACAINE 0.25 % ON-Q PUMP DUAL CATH 300 ML
300.0000 mL | INJECTION | Status: DC
  Filled 2013-12-20: qty 300

## 2013-12-20 MED ORDER — PANTOPRAZOLE SODIUM 40 MG IV SOLR
40.0000 mg | Freq: Every day | INTRAVENOUS | Status: DC
  Administered 2013-12-20 – 2013-12-24 (×5): 40 mg via INTRAVENOUS
  Filled 2013-12-20 (×9): qty 40

## 2013-12-20 MED ORDER — PHENYLEPHRINE 40 MCG/ML (10ML) SYRINGE FOR IV PUSH (FOR BLOOD PRESSURE SUPPORT)
PREFILLED_SYRINGE | INTRAVENOUS | Status: AC
  Filled 2013-12-20: qty 10

## 2013-12-20 MED ORDER — NEOSTIGMINE METHYLSULFATE 1 MG/ML IJ SOLN
INTRAMUSCULAR | Status: DC | PRN
  Administered 2013-12-20: 5 mg via INTRAVENOUS

## 2013-12-20 MED ORDER — ROCURONIUM BROMIDE 100 MG/10ML IV SOLN
INTRAVENOUS | Status: DC | PRN
  Administered 2013-12-20: 25 mg via INTRAVENOUS
  Administered 2013-12-20: 35 mg via INTRAVENOUS
  Administered 2013-12-20: 20 mg via INTRAVENOUS
  Administered 2013-12-20: 10 mg via INTRAVENOUS

## 2013-12-20 MED ORDER — SODIUM CHLORIDE 0.9 % IJ SOLN
9.0000 mL | INTRAMUSCULAR | Status: DC | PRN
Start: 2013-12-20 — End: 2013-12-21

## 2013-12-20 MED ORDER — MORPHINE SULFATE (PF) 1 MG/ML IV SOLN
INTRAVENOUS | Status: DC
  Administered 2013-12-20: 18:00:00 via INTRAVENOUS
  Administered 2013-12-21: 6 mg via INTRAVENOUS
  Filled 2013-12-20: qty 25

## 2013-12-20 MED ORDER — LACTATED RINGERS IV SOLN
INTRAVENOUS | Status: DC
  Administered 2013-12-20: 10:00:00 via INTRAVENOUS

## 2013-12-20 MED ORDER — BUPIVACAINE-EPINEPHRINE (PF) 0.25% -1:200000 IJ SOLN
INTRAMUSCULAR | Status: AC
  Filled 2013-12-20: qty 30

## 2013-12-20 MED ORDER — HYDROMORPHONE HCL PF 1 MG/ML IJ SOLN
0.2500 mg | INTRAMUSCULAR | Status: DC | PRN
  Administered 2013-12-20 (×2): 0.5 mg via INTRAVENOUS

## 2013-12-20 MED ORDER — LACTATED RINGERS IV SOLN
INTRAVENOUS | Status: DC | PRN
  Administered 2013-12-20 (×4): via INTRAVENOUS

## 2013-12-20 MED ORDER — LIDOCAINE HCL (PF) 1 % IJ SOLN
INTRAMUSCULAR | Status: AC
  Filled 2013-12-20: qty 30

## 2013-12-20 MED ORDER — PROPOFOL 10 MG/ML IV BOLUS
INTRAVENOUS | Status: DC | PRN
  Administered 2013-12-20: 80 mg via INTRAVENOUS

## 2013-12-20 MED ORDER — FENTANYL CITRATE 0.05 MG/ML IJ SOLN
INTRAMUSCULAR | Status: AC
  Filled 2013-12-20: qty 5

## 2013-12-20 MED ORDER — GLYCOPYRROLATE 0.2 MG/ML IJ SOLN
INTRAMUSCULAR | Status: DC | PRN
  Administered 2013-12-20: 1 mg via INTRAVENOUS

## 2013-12-20 MED ORDER — HYDROMORPHONE HCL PF 1 MG/ML IJ SOLN
INTRAMUSCULAR | Status: AC
  Filled 2013-12-20: qty 2

## 2013-12-20 MED ORDER — SODIUM CHLORIDE 0.9 % IR SOLN
Status: DC | PRN
  Administered 2013-12-20: 1000 mL

## 2013-12-20 MED ORDER — LIDOCAINE HCL 1 % IJ SOLN
INTRAMUSCULAR | Status: DC | PRN
  Administered 2013-12-20: 13:00:00 via SUBCUTANEOUS

## 2013-12-20 MED ORDER — DIPHENHYDRAMINE HCL 12.5 MG/5ML PO ELIX
12.5000 mg | ORAL_SOLUTION | Freq: Four times a day (QID) | ORAL | Status: DC | PRN
  Filled 2013-12-20: qty 5

## 2013-12-20 MED ORDER — ONDANSETRON HCL 4 MG/2ML IJ SOLN: 4.0000 mg | Freq: Four times a day (QID) | INTRAMUSCULAR | Status: DC | PRN

## 2013-12-20 MED ORDER — MORPHINE SULFATE 10 MG/ML IJ SOLN: 14.0000 mg | INTRAMUSCULAR | Status: DC | PRN

## 2013-12-20 MED ORDER — PROMETHAZINE HCL 25 MG/ML IJ SOLN: 6.2500 mg | INTRAMUSCULAR | Status: DC | PRN

## 2013-12-20 MED ORDER — DEXTROSE 5 % IV SOLN
1.0000 g | Freq: Four times a day (QID) | INTRAVENOUS | Status: AC
  Administered 2013-12-20 – 2013-12-21 (×3): 1 g via INTRAVENOUS
  Filled 2013-12-20 (×4): qty 1

## 2013-12-20 MED ORDER — PHENYLEPHRINE HCL 10 MG/ML IJ SOLN
INTRAMUSCULAR | Status: DC | PRN
  Administered 2013-12-20 (×2): 80 ug via INTRAVENOUS
  Administered 2013-12-20: 120 ug via INTRAVENOUS
  Administered 2013-12-20 (×5): 80 ug via INTRAVENOUS

## 2013-12-20 MED ORDER — NALOXONE HCL 0.4 MG/ML IJ SOLN: 0.4000 mg | INTRAMUSCULAR | Status: DC | PRN

## 2013-12-20 MED ORDER — FENTANYL CITRATE 0.05 MG/ML IJ SOLN
INTRAMUSCULAR | Status: DC | PRN
  Administered 2013-12-20: 50 ug via INTRAVENOUS
  Administered 2013-12-20: 100 ug via INTRAVENOUS
  Administered 2013-12-20: 50 ug via INTRAVENOUS
  Administered 2013-12-20: 100 ug via INTRAVENOUS
  Administered 2013-12-20 (×2): 50 ug via INTRAVENOUS
  Administered 2013-12-20: 100 ug via INTRAVENOUS

## 2013-12-20 MED ORDER — 0.9 % SODIUM CHLORIDE (POUR BTL) OPTIME
TOPICAL | Status: DC | PRN
  Administered 2013-12-20: 2000 mL
  Administered 2013-12-20: 1000 mL

## 2013-12-20 MED ORDER — EVICEL 5 ML EX KIT
PACK | CUTANEOUS | Status: AC
  Filled 2013-12-20: qty 1

## 2013-12-20 MED ORDER — HEPARIN SOD (PORK) LOCK FLUSH 100 UNIT/ML IV SOLN
INTRAVENOUS | Status: AC
  Filled 2013-12-20: qty 5

## 2013-12-20 MED ORDER — SODIUM CHLORIDE 0.9 % IV SOLN
20.0000 mg | INTRAVENOUS | Status: DC | PRN
  Administered 2013-12-20: 50 ug/min via INTRAVENOUS

## 2013-12-20 MED ORDER — CEFOXITIN SODIUM 2 G IV SOLR
2.0000 g | INTRAVENOUS | Status: AC
  Administered 2013-12-20: 2 g via INTRAVENOUS
  Filled 2013-12-20: qty 2

## 2013-12-20 MED ORDER — ONDANSETRON HCL 4 MG/2ML IJ SOLN
INTRAMUSCULAR | Status: DC | PRN
  Administered 2013-12-20: 4 mg via INTRAVENOUS

## 2013-12-20 MED ORDER — EPHEDRINE SULFATE 50 MG/ML IJ SOLN
INTRAMUSCULAR | Status: DC | PRN
  Administered 2013-12-20 (×2): 10 mg via INTRAVENOUS
  Administered 2013-12-20: 5 mg via INTRAVENOUS

## 2013-12-20 MED ORDER — SUCCINYLCHOLINE CHLORIDE 20 MG/ML IJ SOLN
INTRAMUSCULAR | Status: AC
  Filled 2013-12-20: qty 1

## 2013-12-20 MED ORDER — DIPHENHYDRAMINE HCL 50 MG/ML IJ SOLN: 12.5000 mg | Freq: Four times a day (QID) | INTRAMUSCULAR | Status: DC | PRN

## 2013-12-20 MED ORDER — MORPHINE SULFATE (PF) 1 MG/ML IV SOLN
INTRAVENOUS | Status: AC
  Filled 2013-12-20: qty 25

## 2013-12-20 MED ORDER — ALBUMIN HUMAN 5 % IV SOLN
INTRAVENOUS | Status: DC | PRN
  Administered 2013-12-20: 15:00:00 via INTRAVENOUS

## 2013-12-20 MED ORDER — ONDANSETRON HCL 4 MG PO TABS: 4.0000 mg | ORAL_TABLET | Freq: Four times a day (QID) | ORAL | Status: DC | PRN

## 2013-12-20 MED ORDER — PROPOFOL 10 MG/ML IV BOLUS
INTRAVENOUS | Status: AC
  Filled 2013-12-20: qty 20

## 2013-12-20 MED ORDER — LIDOCAINE HCL (CARDIAC) 20 MG/ML IV SOLN
INTRAVENOUS | Status: DC | PRN
  Administered 2013-12-20: 35 mg via INTRAVENOUS

## 2013-12-20 MED ORDER — BUPIVACAINE ON-Q PAIN PUMP (FOR ORDER SET NO CHG)
INJECTION | Status: DC
Start: 2013-12-20 — End: 2013-12-23
  Filled 2013-12-20: qty 1

## 2013-12-20 MED ORDER — LORAZEPAM BOLUS VIA INFUSION
0.5000 mg | Freq: Three times a day (TID) | INTRAVENOUS | Status: DC | PRN
  Filled 2013-12-20: qty 1

## 2013-12-20 MED ORDER — ONDANSETRON HCL 4 MG/2ML IJ SOLN
INTRAMUSCULAR | Status: AC
  Filled 2013-12-20: qty 2

## 2013-12-20 MED ORDER — PHENYLEPHRINE HCL 10 MG/ML IJ SOLN
INTRAMUSCULAR | Status: AC
  Filled 2013-12-20: qty 1

## 2013-12-20 MED ORDER — MIDAZOLAM HCL 2 MG/2ML IJ SOLN
INTRAMUSCULAR | Status: AC
  Filled 2013-12-20: qty 2

## 2013-12-20 MED ORDER — ACETAMINOPHEN 10 MG/ML IV SOLN
1000.0000 mg | Freq: Four times a day (QID) | INTRAVENOUS | Status: AC
  Administered 2013-12-20 – 2013-12-21 (×4): 1000 mg via INTRAVENOUS
  Filled 2013-12-20 (×5): qty 100

## 2013-12-20 MED ORDER — MIDAZOLAM HCL 5 MG/5ML IJ SOLN
INTRAMUSCULAR | Status: DC | PRN
  Administered 2013-12-20: 2 mg via INTRAVENOUS

## 2013-12-20 MED ORDER — ONDANSETRON HCL 4 MG/2ML IJ SOLN: 4.0000 mg | Freq: Once | INTRAMUSCULAR | Status: DC | PRN

## 2013-12-20 MED ORDER — KCL IN DEXTROSE-NACL 20-5-0.45 MEQ/L-%-% IV SOLN
INTRAVENOUS | Status: DC
  Administered 2013-12-20 – 2013-12-24 (×5): via INTRAVENOUS
  Filled 2013-12-20 (×13): qty 1000

## 2013-12-20 SURGICAL SUPPLY — 119 items
APPLIER CLIP ROT 10 11.4 M/L (STAPLE) ×3
BAG DECANTER FOR FLEXI CONT (MISCELLANEOUS) ×3 IMPLANT
BLADE SURG 11 STRL SS (BLADE) ×3 IMPLANT
BLADE SURG 15 STRL LF DISP TIS (BLADE) ×1 IMPLANT
BLADE SURG 15 STRL SS (BLADE) ×2
BLADE SURG ROTATE 9660 (MISCELLANEOUS) IMPLANT
CANISTER SUCTION 2500CC (MISCELLANEOUS) ×3 IMPLANT
CHLORAPREP W/TINT 10.5 ML (MISCELLANEOUS) ×3 IMPLANT
CHLORAPREP W/TINT 26ML (MISCELLANEOUS) ×3 IMPLANT
CLIP APPLIE ROT 10 11.4 M/L (STAPLE) ×1 IMPLANT
CLIP LIGATING HEM O LOK PURPLE (MISCELLANEOUS) ×6 IMPLANT
CLIP LIGATING HEMO O LOK GREEN (MISCELLANEOUS) ×6 IMPLANT
CLIP LIGATING HEMOLOK MED (MISCELLANEOUS) ×6 IMPLANT
CLIP TI LARGE 6 (CLIP) ×3 IMPLANT
CLIP TI MEDIUM 24 (CLIP) ×3 IMPLANT
COVER MAYO STAND STRL (DRAPES) ×3 IMPLANT
COVER PROBE W GEL 5X96 (DRAPES) ×3 IMPLANT
COVER SURGICAL LIGHT HANDLE (MISCELLANEOUS) ×3 IMPLANT
COVER TRANSDUCER ULTRASND (DRAPES) IMPLANT
CRADLE DONUT ADULT HEAD (MISCELLANEOUS) ×3 IMPLANT
DECANTER SPIKE VIAL GLASS SM (MISCELLANEOUS) ×6 IMPLANT
DERMABOND ADVANCED (GAUZE/BANDAGES/DRESSINGS) ×2
DERMABOND ADVANCED .7 DNX12 (GAUZE/BANDAGES/DRESSINGS) ×1 IMPLANT
DRAIN CHANNEL 19F RND (DRAIN) ×3 IMPLANT
DRAPE C-ARM 42X72 X-RAY (DRAPES) ×3 IMPLANT
DRAPE CHEST BREAST 15X10 FENES (DRAPES) ×3 IMPLANT
DRAPE LAPAROSCOPIC ABDOMINAL (DRAPES) ×3 IMPLANT
DRAPE UTILITY 15X26 W/TAPE STR (DRAPE) ×6 IMPLANT
DRAPE WARM FLUID 44X44 (DRAPE) ×3 IMPLANT
DRSG COVADERM 4X8 (GAUZE/BANDAGES/DRESSINGS) ×3 IMPLANT
DRSG TEGADERM 4X4.75 (GAUZE/BANDAGES/DRESSINGS) ×3 IMPLANT
ELECT BLADE 6.5 EXT (BLADE) ×6 IMPLANT
ELECT COATED BLADE 2.86 ST (ELECTRODE) ×3 IMPLANT
ELECT REM PT RETURN 9FT ADLT (ELECTROSURGICAL) ×3
ELECTRODE REM PT RTRN 9FT ADLT (ELECTROSURGICAL) ×1 IMPLANT
EVACUATOR SILICONE 100CC (DRAIN) ×3 IMPLANT
FILTER SMOKE EVAC LAPAROSHD (FILTER) IMPLANT
GAUZE SPONGE 4X4 16PLY XRAY LF (GAUZE/BANDAGES/DRESSINGS) ×3 IMPLANT
GEL PDS (MISCELLANEOUS) ×3 IMPLANT
GLOVE BIO SURGEON STRL SZ 6 (GLOVE) ×3 IMPLANT
GLOVE BIO SURGEON STRL SZ7.5 (GLOVE) ×24 IMPLANT
GLOVE BIOGEL PI IND STRL 6.5 (GLOVE) ×1 IMPLANT
GLOVE BIOGEL PI INDICATOR 6.5 (GLOVE) ×2
GOWN STRL NON-REIN LRG LVL3 (GOWN DISPOSABLE) ×9 IMPLANT
GOWN STRL REUS W/TWL 2XL LVL3 (GOWN DISPOSABLE) ×6 IMPLANT
HANDLE UNIV ENDO GIA (ENDOMECHANICALS) ×3 IMPLANT
HEMOSTAT SURGICEL 2X14 (HEMOSTASIS) IMPLANT
KIT BASIN OR (CUSTOM PROCEDURE TRAY) ×3 IMPLANT
KIT PORT POWER 8FR ISP CVUE (Catheter) IMPLANT
KIT PORT POWER 9.6FR MRI PREA (Catheter) IMPLANT
KIT PORT POWER ISP 8FR (Catheter) IMPLANT
KIT POWER CATH 8FR (Catheter) IMPLANT
KIT ROOM TURNOVER OR (KITS) ×3 IMPLANT
NEEDLE HYPO 25GX1X1/2 BEV (NEEDLE) ×3 IMPLANT
NS IRRIG 1000ML POUR BTL (IV SOLUTION) ×6 IMPLANT
PACK GENERAL/GYN (CUSTOM PROCEDURE TRAY) ×3 IMPLANT
PACK SURGICAL SETUP 50X90 (CUSTOM PROCEDURE TRAY) ×3 IMPLANT
PAD ARMBOARD 7.5X6 YLW CONV (MISCELLANEOUS) ×6 IMPLANT
PENCIL BUTTON HOLSTER BLD 10FT (ELECTRODE) ×6 IMPLANT
POUCH SPECIMEN RETRIEVAL 10MM (ENDOMECHANICALS) ×3 IMPLANT
RELOAD 45 VASCULAR/THIN (ENDOMECHANICALS) IMPLANT
RELOAD BLUE (STAPLE) ×6 IMPLANT
RELOAD EGIA 60 TAN VASC (STAPLE) ×6 IMPLANT
RELOAD GOLD (STAPLE) ×3 IMPLANT
RELOAD PROXIMATE 75MM BLUE (ENDOMECHANICALS) ×9 IMPLANT
RELOAD STAPLER LINE PROX 60 GR (STAPLE) ×1 IMPLANT
SCISSORS LAP 5X35 DISP (ENDOMECHANICALS) ×3 IMPLANT
SET CHOLANGIOGRAPH 5 50 .035 (SET/KITS/TRAYS/PACK) IMPLANT
SET IRRIG TUBING LAPAROSCOPIC (IRRIGATION / IRRIGATOR) ×3 IMPLANT
SLEEVE ENDOPATH XCEL 5M (ENDOMECHANICALS) ×3 IMPLANT
SPECIMEN JAR SMALL (MISCELLANEOUS) ×3 IMPLANT
SPECIMEN JAR X LARGE (MISCELLANEOUS) ×3 IMPLANT
SPONGE GAUZE 4X4 12PLY (GAUZE/BANDAGES/DRESSINGS) ×6 IMPLANT
SPONGE GAUZE 4X4 12PLY STER LF (GAUZE/BANDAGES/DRESSINGS) ×3 IMPLANT
SPONGE INTESTINAL PEANUT (DISPOSABLE) IMPLANT
SPONGE LAP 18X18 X RAY DECT (DISPOSABLE) ×21 IMPLANT
STAPLE ECHEON FLEX 60 POW ENDO (STAPLE) ×6 IMPLANT
STAPLER PROXIMATE 75MM BLUE (STAPLE) ×3 IMPLANT
STAPLER RELOAD LINE PROX 60 GR (STAPLE) ×3
STAPLER VISISTAT 35W (STAPLE) ×3 IMPLANT
STRIP PERI DRY VERITAS 60 (STAPLE) ×3 IMPLANT
SUCTION POOLE TIP (SUCTIONS) ×3 IMPLANT
SUT ETHILON 2 0 FS 18 (SUTURE) ×3 IMPLANT
SUT MNCRL AB 4-0 PS2 18 (SUTURE) ×3 IMPLANT
SUT MON AB 4-0 PC3 18 (SUTURE) ×3 IMPLANT
SUT NOVA 1 T20/GS 25DT (SUTURE) IMPLANT
SUT PDS AB 1 TP1 96 (SUTURE) ×3 IMPLANT
SUT PDS AB 3-0 SH 27 (SUTURE) ×6 IMPLANT
SUT PDS II 0 TP-1 LOOPED 60 (SUTURE) ×6 IMPLANT
SUT PROLENE 2 0 SH DA (SUTURE) ×6 IMPLANT
SUT PROLENE 3 0 SH 1 (SUTURE) ×6 IMPLANT
SUT PROLENE 4 0 RB 1 (SUTURE) ×4
SUT PROLENE 4-0 RB1 .5 CRCL 36 (SUTURE) ×2 IMPLANT
SUT SILK 2 0 REEL (SUTURE) IMPLANT
SUT SILK 2 0 SH CR/8 (SUTURE) ×6 IMPLANT
SUT SILK 2 0 TIES 10X30 (SUTURE) ×3 IMPLANT
SUT VIC AB 3-0 SH 18 (SUTURE) ×3 IMPLANT
SUT VIC AB 3-0 SH 27 (SUTURE) ×2
SUT VIC AB 3-0 SH 27X BRD (SUTURE) ×1 IMPLANT
SUT VIC AB 3-0 SH 8-18 (SUTURE) ×3 IMPLANT
SUT VICRYL AB 2 0 TIES (SUTURE) ×3 IMPLANT
SUT VICRYL AB 3 0 TIES (SUTURE) ×3 IMPLANT
SYR 20ML ECCENTRIC (SYRINGE) IMPLANT
SYR 5ML LUER SLIP (SYRINGE) IMPLANT
SYR CONTROL 10ML LL (SYRINGE) ×3 IMPLANT
TAPE CLOTH SURG 4X10 WHT LF (GAUZE/BANDAGES/DRESSINGS) ×3 IMPLANT
TIP RIGID 35CM EVICEL (HEMOSTASIS) ×3 IMPLANT
TOWEL OR 17X24 6PK STRL BLUE (TOWEL DISPOSABLE) ×3 IMPLANT
TOWEL OR 17X26 10 PK STRL BLUE (TOWEL DISPOSABLE) ×3 IMPLANT
TOWEL OR NON WOVEN STRL DISP B (DISPOSABLE) ×3 IMPLANT
TRAY FOLEY CATH 14FRSI W/METER (CATHETERS) ×3 IMPLANT
TRAY LAPAROSCOPIC (CUSTOM PROCEDURE TRAY) ×3 IMPLANT
TROCAR XCEL BLUNT TIP 100MML (ENDOMECHANICALS) ×3 IMPLANT
TROCAR XCEL NON-BLD 11X100MML (ENDOMECHANICALS) ×3 IMPLANT
TROCAR XCEL NON-BLD 5MMX100MML (ENDOMECHANICALS) ×3 IMPLANT
TUBE CONNECTING 12'X1/4 (SUCTIONS) ×1
TUBE CONNECTING 12X1/4 (SUCTIONS) ×2 IMPLANT
WATER STERILE IRR 1000ML POUR (IV SOLUTION) ×3 IMPLANT
YANKAUER SUCT BULB TIP NO VENT (SUCTIONS) IMPLANT

## 2013-12-20 NOTE — Anesthesia Preprocedure Evaluation (Signed)
Anesthesia Evaluation  Patient identified by MRN, date of birth, ID band Patient awake    Reviewed: Allergy & Precautions, H&P , NPO status , Patient's Chart, lab work & pertinent test results  Airway Mallampati: II TM Distance: >3 FB Neck ROM: Full    Dental  (+) Teeth Intact, Dental Advisory Given, Implants   Pulmonary former smoker,  breath sounds clear to auscultation        Cardiovascular Rhythm:Regular Rate:Normal     Neuro/Psych    GI/Hepatic   Endo/Other    Renal/GU      Musculoskeletal   Abdominal   Peds  Hematology   Anesthesia Other Findings   Reproductive/Obstetrics                           Anesthesia Physical Anesthesia Plan  ASA: II  Anesthesia Plan: General   Post-op Pain Management:    Induction: Intravenous  Airway Management Planned: Oral ETT  Additional Equipment:   Intra-op Plan:   Post-operative Plan: Extubation in OR  Informed Consent: I have reviewed the patients History and Physical, chart, labs and discussed the procedure including the risks, benefits and alternatives for the proposed anesthesia with the patient or authorized representative who has indicated his/her understanding and acceptance.   Dental advisory given  Plan Discussed with: CRNA and Anesthesiologist  Anesthesia Plan Comments:         Anesthesia Quick Evaluation

## 2013-12-20 NOTE — Op Note (Addendum)
PRE-OPERATIVE DIAGNOSIS: Pancreatic cancer, cT2N0M0, pancreatic pseudocyst, chronic pancreatitis, chronic cholecystitis  POST-OPERATIVE DIAGNOSIS:  Same  PROCEDURE:  Procedure(s): Diagnostic laparoscopy, laparoscopic cholecystectomy, open extended distal pancreatectomy, splenectomy, partial gastrectomy x2, intraoperative ultrasound of pancreas.    SURGEON:  Surgeon(s): [name], MD  ASSISTANT:  Greer Pickerel, MD (primary asst), Doreen Salvage, MD  ANESTHESIA:   general + local  DRAINS: (19 Fr ) Blake drain(s) in the LUQ of abdomen, tubing of drain comes out Right abdomen   LOCAL MEDICATIONS USED:  MARCAINE     SPECIMEN:  Source of Specimen:  gallbladder, spleen with segment of stomach, distal pancreas with section of stomach, additional pancreatic margin, peripancreatic lymph node.  DISPOSITION OF SPECIMEN:  PATHOLOGY  COUNTS:  YES  DICTATION: .Dragon Dictation  PLAN OF CARE: Admit to inpatient   PATIENT DISPOSITION:  ICU - extubated and stable.   EBL: 500 ml  FINDINGS:  Sequelae of pancreatitis and pseudocyst with spleen adherent to stomach.  Firm mass in body of pancreas.  No evidence of metastatic disease.  Chronic cholecystitis. Final pancreatic margin on top of SMV.    PROCEDURE:  The patient was identified in the holding area and taken the operating room where she was placed supine on the operating room table. General anesthesia was induced. Her abdomen was prepped and draped in sterile fashion. Timeouts performed according to the surgical safety checklist. When all was correct we continued. The supraumbilical skin was anesthetized with local and. A vertical incision was made approximately 1.5 cm in length just above the umbilicus. The Kelly clamp was used to spread the simultaneous tissues. The fascia was elevated in the midline with 2 Coker clamps. A vertical incision was made in the fascia. The Claiborne Billings was used to confirm entrance into the peritoneal cavity. A 0 Vicryl  pursestring was placed around the fascial incision. The [facility] trocar was introduced into the abdomen and held in place to the abdominal wall itself the suture. Pneumoperitoneum was achieved to a pressure of 15 mm mercury. The patient's abdomen was examined. There is no evidence of metastatic disease. A 11 mm port was placed in the subxiphoid position vertically oriented. 25 mm trocar she placed in the right upper quadrant after administration of local. The fundus the gallbladder was grasped and elevated toward the head. The infundibulum was retracted l. A [place] dissector was used to isolate the cystic duct and the cystic artery. These were clamped. They were divided after the critical view was definitively obtained. The cautery was used to take the gallbladder off the gallbladder fossa. There was some spillage of bile but no evidence of stones.  The patient's abdomen was then opened in the midline. A Bookwalter retractor was placed for visualization. The lesser sac was opened by taking the omentum off the colon. There was some omentum adherent to the left lateral abdominal wall which was taken down. The spleen was then mobilized from lateral to medial. This was quite difficult as there was significant inflammation. The spleen was also adherent to the stomach. For cost or for taken down partially but then the portion of stomach that was adherent to the spleen and pancreatic pseudocyst was taken down with the GIA stapler. Care was taken to make sure that the GE junction was not divided and that there is not significant narrowing. An additional portion of the stomach was adherent to the location of the mass in the mid body of the pancreas.  This section was also divided with the GIA-75 stapler.  The inferior border of the pancreas was isolated with accommodation of blunt and sharp dissection. The superior aspect of the pancreas with less well-defined. The harmonic scalpel was used to assist with this.  Care was  taken to avoid taking down the left gastric artery. The splenic artery and vein were difficult to see on the posterior pancreas. Because of bleeding from the spleen, the spleen was disconnected from the tail of the pancreas and sent as a separate specimen.  The ultrasound was used to locate the mass in the body/neck junction of the pancreas.  The body and tail were examined to look for the mass.  This was done since the pancreas was so firm.  Once we mobilized around a centimeter beyond where the masses palpated, the GIA 60 mm block with stapler was used to divide the pancreas.  Several different staplers were required. Multiple different loads would not close on the pancreas.  This was sent for frozen section.    A 19 Fr. Blake drain was placed through one of the right port sites and placed in the abdomen.  This was secured to the abdominal wall with a 2-0 nylon.  OnQ tunnelers were placed on both sides of the fascia.  The frozen returned and was suspicious.  Additional margin was taken which was very difficult.  The pancreas was mobilized with the overholdt. Several small vessels off the IMV/SMV confluence had to be suture ligated. An additional staple load was used to take the margin.  Pressure was held to make sure this was hemostatic.  Evicel was placed, and the omentum was laid down on the pancreatic margin.    The abdomen was irrigated.  The drain was placed in the correct location.  The fascia was closed with #1 looped PDS.  The skin was closed with staples.  The OnQ catheters were placed.  The wounds were cleaned, dried, dressed with soft dressings.    The patient was extubated and taken to the PACU in stable condition.  Needle, sponge, and instrument counts were correct x 2.

## 2013-12-20 NOTE — Anesthesia Postprocedure Evaluation (Signed)
  Anesthesia Post-op Note  Patient: Annette Beltran  Procedure(s) Performed: Procedure(s): LAPAROSCOPY DIAGNOSTIC  (N/A) OPEN DISTAL PANCREATECTOMY (N/A) LAPAROSCOPIC CHOLECYSTECTOMY (N/A) SPLENECTOMY (N/A) PARTIAL GASTRECTOMY (N/A)  Patient Location: PACU  Anesthesia Type:General  Level of Consciousness: awake, alert  and oriented  Airway and Oxygen Therapy: Patient Spontanous Breathing and Patient connected to nasal cannula oxygen  Post-op Pain: mild  Post-op Assessment: Post-op Vital signs reviewed, Patient's Cardiovascular Status Stable, Respiratory Function Stable, Patent Airway, No signs of Nausea or vomiting and Pain level controlled  Post-op Vital Signs: stable  Complications: No apparent anesthesia complications

## 2013-12-20 NOTE — Interval H&P Note (Signed)
History and Physical Interval Note:  12/20/2013 11:01 AM  Annette Beltran  has presented today for surgery, with the diagnosis of pancreatic cancer chronic cholecystitis   The various methods of treatment have been discussed with the patient and family. After consideration of risks, benefits and other options for treatment, the patient has consented to  Procedure(s): LAPAROSCOPY DIAGNOSTIC  (N/A) possible open  distal PANCREATECTOMY (N/A) Possible INSERTION PORT-A-CATH (N/A) LAPAROSCOPIC CHOLECYSTECTOMY (N/A) SPLENECTOMY (N/A) as a surgical intervention .  The patient's history has been reviewed, patient examined, no change in status, stable for surgery.  I have reviewed the patient's chart and labs.  Questions were answered to the patient's satisfaction.     Malia Corsi

## 2013-12-20 NOTE — Transfer of Care (Signed)
Immediate Anesthesia Transfer of Care Note  Patient: Annette Beltran  Procedure(s) Performed: Procedure(s): LAPAROSCOPY DIAGNOSTIC  (N/A) OPEN DISTAL PANCREATECTOMY (N/A) LAPAROSCOPIC CHOLECYSTECTOMY (N/A) SPLENECTOMY (N/A) PARTIAL GASTRECTOMY (N/A)  Patient Location: PACU  Anesthesia Type:General  Level of Consciousness: sedated and patient cooperative  Airway & Oxygen Therapy: Patient Spontanous Breathing and Patient connected to face mask oxygen  Post-op Assessment: Report given to PACU RN, Post -op Vital signs reviewed and stable and Patient moving all extremities X 4  Post vital signs: Reviewed and stable  Complications: No apparent anesthesia complications

## 2013-12-20 NOTE — Anesthesia Procedure Notes (Signed)
Procedure Name: Intubation Date/Time: 12/20/2013 12:02 PM Performed by: Greggory Stallion, Thaddus Mcdowell L Pre-anesthesia Checklist: Patient identified, Emergency Drugs available, Suction available, Patient being monitored and Timeout performed Patient Re-evaluated:Patient Re-evaluated prior to inductionOxygen Delivery Method: Circle system utilized Preoxygenation: Pre-oxygenation with 100% oxygen Intubation Type: IV induction and Cricoid Pressure applied Ventilation: Mask ventilation without difficulty Laryngoscope Size: Mac, 3, Miller and 2 Grade View: Grade III Tube type: Oral Number of attempts: 2 Airway Equipment and Method: Stylet Placement Confirmation: ETT inserted through vocal cords under direct vision,  positive ETCO2 and breath sounds checked- equal and bilateral Secured at: 7.5 cm Tube secured with: Tape Dental Injury: Teeth and Oropharynx as per pre-operative assessment  Difficulty Due To: Difficulty was unanticipated and Difficult Airway- due to anterior larynx Future Recommendations: Recommend- induction with short-acting agent, and alternative techniques readily available Comments: First dl with mac 3 and heavycrichoid grade 4 view with crichoid , second dl with miller 2 grade 3 view ett passed anterior . Atraumatic intubation teeth per preop

## 2013-12-20 NOTE — H&P (View-Only) (Signed)
Chief Complaint  Patient presents with  . New Evaluation    eval possible pancreatic cancer    HISTORY: Pt is a 68 yo F who presents with history of splenic infarct and abdominal pain.  She had worsening of the pain which seemed to be left chest pain.  She went to the ED and got a chest CT which was negative for PE.  She continued to have horrible pain, and an abd ct was ordered the next day by her PCP.  This demonstrated a cystic mass in the tail of the pancreas and pancreatitis.  She subsequently underwent an endoscopic ultrasound and was found to a mass in the center of the pancreas.  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  She denies diabetes, diarrhea, or weight loss.  She has a history of breast cancer.  She was found to have a pleural effusion on left.  This was aspirated on 1/30.    Past Medical History  Diagnosis Date  . Breast cancer   . Macular degeneration   . Fluid retention   . Colitis, collagenous   . Vitamin D deficiency   . Osteopenia   . Celiac artery stenosis   . Splenic infarction   . Arthritis   . History of blood clots   . Anxiety   . Shingles   . Pancreatitis   . Arrhythmia   . Osteoporosis   . Clotting disorder     Past Surgical History  Procedure Laterality Date  . Foot surgery  2003    x 3, 2 on right, 1 on left  . Breast lumpectomy Right     radiation for 6 weeks  . Meniscus repair Right   . Eus N/A 11/23/2013    Procedure: UPPER ENDOSCOPIC ULTRASOUND (EUS) LINEAR;  Surgeon: Milus Banister, MD;  Location: [facility];  Service: Endoscopy;  Laterality: N/A;    Current Outpatient Prescriptions  Medication Sig Dispense Refill  . ALPRAZolam (XANAX) 0.5 MG tablet Take 1 tablet (0.5 mg total) by mouth 2 (two) times daily.  60 tablet  3  . Ascorbic Acid (VITAMIN C PO) Take 1 tablet by mouth 2 (two) times daily.      Marland Kitchen aspirin 81 MG tablet Take 81 mg by mouth daily.      Marland Kitchen azithromycin (ZITHROMAX Z-PAK) 250 MG tablet Take 2 tablets (500 mg) on   Day 1,  followed by 1 tablet (250 mg) once daily on Days 2 through 5.  6 each  0  . b complex vitamins tablet Take 1 tablet by mouth daily.      . diphenhydrAMINE (BENADRYL) 25 MG tablet Take 25 mg by mouth every 6 (six) hours as needed for itching or allergies.      Marland Kitchen loratadine (CLARITIN) 10 MG tablet Take 10 mg by mouth daily.      . meperidine (DEMEROL) 50 MG tablet Take 50 mg by mouth every 6 (six) hours as needed for moderate pain or severe pain.      . Multiple Vitamins-Minerals (PRESERVISION AREDS PO) Take by mouth.       . naproxen sodium (ANAPROX) 220 MG tablet Take 220 mg by mouth 2 (two) times daily with a meal.      . omeprazole (PRILOSEC) 20 MG capsule Take 1 capsule (20 mg total) by mouth daily.  30 capsule  2  . furosemide (LASIX) 20 MG tablet Take 1 tablet (20 mg total) by mouth daily.  30 tablet  0  . PARoxetine (  PAXIL) 10 MG tablet Take 1 tablet (10 mg total) by mouth daily.  30 tablet  5  . potassium chloride (K-DUR) 10 MEQ tablet Take 1 tablet (10 mEq total) by mouth daily.  30 tablet  0   No current facility-administered medications for this visit.     Allergies  Allergen Reactions  . Codeine Nausea And Vomiting  . Lactose Intolerance (Gi)   . Tequin     Severe stomach pain  . Penicillins Nausea Only and Rash     Family History  Problem Relation Age of Onset  . Colon cancer Neg Hx   . Aneurysm Mother   . Stroke Mother   . Prostate cancer Father   . Skin cancer Brother   . Heart failure Paternal Aunt   . Cancer Paternal Aunt     breast  . Pancreatic cancer Maternal Grandmother   . Cancer Maternal Aunt     breast     History   Social History  . Marital Status: Married    Spouse Name: N/A    Number of Children: 2  . Years of Education: N/A   Occupational History  . Self employed     Social History Main Topics  . Smoking status: Former Smoker -- 0.50 packs/day for 18 years    Types: Cigarettes    Quit date: 11/02/2000  . Smokeless tobacco:  Never Used  . Alcohol Use: No     Comment: 1-2 alcohol drinks/day- NONE AS OF 11-20-2013  . Drug Use: No  . Sexual Activity: None   Other Topics Concern  . None   Social History Narrative   2 caffeine drinks daily      REVIEW OF SYSTEMS - PERTINENT POSITIVES ONLY: 12 point review of systems negative other than HPI and PMH except for fatigue.  EXAMDanley Danker Vitals:   12/04/13 0958  BP: 136/74  Pulse: 64  Temp: 98.4 F (36.9 C)  Resp: 14    Wt Readings from Last 3 Encounters:  12/04/13 156 lb 3.2 oz (70.852 kg)  11/30/13 158 lb 3.2 oz (71.759 kg)  11/28/13 160 lb (72.576 kg)     Gen:  No acute distress.  Well nourished and well groomed.   Neurological: Alert and oriented to person, place, and time. Coordination normal.  Head: Normocephalic and atraumatic.  Eyes: Conjunctivae are normal. Pupils are equal, round, and reactive to light. No scleral icterus.  Neck: Normal range of motion. Neck supple. No tracheal deviation or thyromegaly present.  Cardiovascular: Normal rate, regular rhythm, normal heart sounds and intact distal pulses.  Exam reveals no gallop and no friction rub.  No murmur heard. Respiratory: Effort normal.  No respiratory distress. No chest wall tenderness. Breath sounds normal.  No wheezes, rales or rhonchi.  GI: Soft. Bowel sounds are normal. The abdomen is soft and nontender.  There is no rebound and no guarding.  Musculoskeletal: Normal range of motion. Extremities are nontender.  Lymphadenopathy: No cervical, preauricular, postauricular or axillary adenopathy is present Skin: Skin is warm and dry. No rash noted. No diaphoresis. No erythema. No pallor. No clubbing, cyanosis, or edema.   Psychiatric: Normal mood and affect. Behavior is normal. Judgment and thought content normal.    LABORATORY RESULTS: Available labs are reviewed   Recent Results (from the past 2160 hour(s))  SEDIMENTATION RATE     Status: Abnormal   Collection Time    09/26/13  4:20  PM      Result Value Range  Sed Rate 42 (*) 0 - 22 mm/hr  AMYLASE     Status: Abnormal   Collection Time    09/26/13  4:20 PM      Result Value Range   Amylase 237 (*) 27 - 131 U/L  LIPASE     Status: Abnormal   Collection Time    09/26/13  4:20 PM      Result Value Range   Lipase 513.0 Verified by manual dilution. (*) 11.0 - 59.0 U/L  AMYLASE     Status: None   Collection Time    10/20/13  3:26 PM      Result Value Range   Amylase 87  27 - 131 U/L  LIPASE     Status: Abnormal   Collection Time    10/20/13  3:26 PM      Result Value Range   Lipase 76.0 (*) 11.0 - 59.0 U/L  HEPATIC FUNCTION PANEL     Status: None   Collection Time    10/20/13  3:26 PM      Result Value Range   Total Bilirubin 0.6  0.3 - 1.2 mg/dL   Bilirubin, Direct 0.1  0.0 - 0.3 mg/dL   Alkaline Phosphatase 55  39 - 117 U/L   AST 23  0 - 37 U/L   ALT 23  0 - 35 U/L   Total Protein 7.5  6.0 - 8.3 g/dL   Albumin 3.6  3.5 - 5.2 g/dL  CBC WITH DIFFERENTIAL     Status: Abnormal   Collection Time    10/20/13  3:26 PM      Result Value Range   WBC 10.4  4.5 - 10.5 K/uL   RBC 4.31  3.87 - 5.11 Mil/uL   Hemoglobin 13.1  12.0 - 15.0 g/dL   HCT 37.8  36.0 - 46.0 %   MCV 87.8  78.0 - 100.0 fl   MCHC 34.8  30.0 - 36.0 g/dL   RDW 12.9  11.5 - 14.6 %   Platelets 448.0 (*) 150.0 - 400.0 K/uL   Neutrophils Relative % 70.7  43.0 - 77.0 %   Lymphocytes Relative 17.7  12.0 - 46.0 %   Monocytes Relative 8.5  3.0 - 12.0 %   Eosinophils Relative 2.6  0.0 - 5.0 %   Basophils Relative 0.5  0.0 - 3.0 %   Neutro Abs 7.3  1.4 - 7.7 K/uL   Lymphs Abs 1.8  0.7 - 4.0 K/uL   Monocytes Absolute 0.9  0.1 - 1.0 K/uL   Eosinophils Absolute 0.3  0.0 - 0.7 K/uL   Basophils Absolute 0.0  0.0 - 0.1 K/uL  ANA     Status: None   Collection Time    10/24/13  4:14 PM      Result Value Range   ANA NEG  NEGATIVE  IGG     Status: None   Collection Time    10/24/13  4:14 PM      Result Value Range   IgG (Immunoglobin G), Serum  1000  690 - 1700 mg/dL  CBC WITH DIFFERENTIAL     Status: Abnormal   Collection Time    11/13/13  3:11 PM      Result Value Range   WBC 12.5 (*) 4.5 - 10.5 K/uL   RBC 4.52  3.87 - 5.11 Mil/uL   Hemoglobin 13.5  12.0 - 15.0 g/dL   HCT 38.7  36.0 - 46.0 %   MCV 85.8  78.0 - 100.0  fl   MCHC 34.9  30.0 - 36.0 g/dL   RDW 13.4  11.5 - 14.6 %   Platelets 395.0  150.0 - 400.0 K/uL   Neutrophils Relative % 75.8  43.0 - 77.0 %   Lymphocytes Relative 14.7  12.0 - 46.0 %   Monocytes Relative 5.3  3.0 - 12.0 %   Eosinophils Relative 3.2  0.0 - 5.0 %   Basophils Relative 1.0  0.0 - 3.0 %   Neutro Abs 9.5 (*) 1.4 - 7.7 K/uL   Lymphs Abs 1.8  0.7 - 4.0 K/uL   Monocytes Absolute 0.7  0.1 - 1.0 K/uL   Eosinophils Absolute 0.4  0.0 - 0.7 K/uL   Basophils Absolute 0.1  0.0 - 0.1 K/uL  COMPREHENSIVE METABOLIC PANEL     Status: Abnormal   Collection Time    11/13/13  3:11 PM      Result Value Range   Sodium 138  135 - 145 mEq/L   Potassium 4.3  3.5 - 5.1 mEq/L   Chloride 102  96 - 112 mEq/L   CO2 28  19 - 32 mEq/L   Glucose, Bld 108 (*) 70 - 99 mg/dL   BUN 14  6 - 23 mg/dL   Creatinine, Ser 0.9  0.4 - 1.2 mg/dL   Total Bilirubin 0.5  0.3 - 1.2 mg/dL   Alkaline Phosphatase 60  39 - 117 U/L   AST 22  0 - 37 U/L   ALT 23  0 - 35 U/L   Total Protein 7.4  6.0 - 8.3 g/dL   Albumin 3.7  3.5 - 5.2 g/dL   Calcium 9.6  8.4 - 10.5 mg/dL   GFR 69.35  >60.00 mL/min  LIPASE     Status: Abnormal   Collection Time    11/13/13  3:11 PM      Result Value Range   Lipase 98.0 (*) 11.0 - 59.0 U/L  AMYLASE     Status: None   Collection Time    11/13/13  3:11 PM      Result Value Range   Amylase 110  27 - 131 U/L  LIPASE     Status: Abnormal   Collection Time    11/13/13  3:11 PM      Result Value Range   Lipase 96.0 (*) 11.0 - 59.0 U/L  CBC WITH DIFFERENTIAL     Status: Abnormal   Collection Time    11/28/13  4:35 PM      Result Value Range   WBC 10.4  4.0 - 10.5 K/uL   RBC 4.25  3.87 - 5.11 MIL/uL    Hemoglobin 12.8  12.0 - 15.0 g/dL   HCT 35.5 (*) 36.0 - 46.0 %   MCV 83.5  78.0 - 100.0 fL   MCH 30.1  26.0 - 34.0 pg   MCHC 36.1 (*) 30.0 - 36.0 g/dL   RDW 13.9  11.5 - 15.5 %   Platelets 391  150 - 400 K/uL   Neutrophils Relative % 73  43 - 77 %   Neutro Abs 7.5  1.7 - 7.7 K/uL   Lymphocytes Relative 15  12 - 46 %   Lymphs Abs 1.6  0.7 - 4.0 K/uL   Monocytes Relative 10  3 - 12 %   Monocytes Absolute 1.0  0.1 - 1.0 K/uL   Eosinophils Relative 2  0 - 5 %   Eosinophils Absolute 0.3  0.0 - 0.7 K/uL   Basophils Relative  0  0 - 1 %   Basophils Absolute 0.0  0.0 - 0.1 K/uL   Smear Review Criteria for review not met    COMPREHENSIVE METABOLIC PANEL     Status: Abnormal   Collection Time    11/28/13  4:35 PM      Result Value Range   Sodium 139  135 - 145 mEq/L   Potassium 3.5  3.5 - 5.3 mEq/L   Chloride 100  96 - 112 mEq/L   CO2 29  19 - 32 mEq/L   Glucose, Bld 110 (*) 70 - 99 mg/dL   BUN 10  6 - 23 mg/dL   Creat 0.62  0.50 - 1.10 mg/dL   Total Bilirubin 0.4  0.3 - 1.2 mg/dL   Comment: ** Please note change in reference range(s). **   Alkaline Phosphatase 64  39 - 117 U/L   AST 17  0 - 37 U/L   ALT 14  0 - 35 U/L   Total Protein 6.4  6.0 - 8.3 g/dL   Albumin 3.5  3.5 - 5.2 g/dL   Calcium 9.1  8.4 - 10.5 mg/dL  LIPASE     Status: Abnormal   Collection Time    11/28/13  4:35 PM      Result Value Range   Lipase 110 (*) 0 - 75 U/L  AMYLASE     Status: None   Collection Time    11/28/13  4:35 PM      Result Value Range   Amylase 47  0 - 105 U/L  CANCER ANTIGEN 19-9     Status: Abnormal   Collection Time    11/29/13  3:33 PM      Result Value Range   CA 19-9 47.0 (*) <35.0 U/mL  ALBUMIN, FLUID     Status: None   Collection Time    12/01/13 11:50 AM      Result Value Range   Albumin, Fluid 2.3     Comment: NO NORMAL RANGE ESTABLISHED FOR THIS TEST   Fluid Type-FALB PLEURAL     Comment: FLUID     LEFT  AMYLASE, PLEURAL FLUID     Status: None   Collection Time     12/01/13 11:50 AM      Result Value Range   Amylase, Pleural Fluid 80     Comment: (NOTE)     Reference range:     Values are considered abnormal if they are greater than or equal to     two times a simultaneously analyzed serum value.     Performed at [place] DEHYDROGENASE, BODY FLUID     Status: Abnormal   Collection Time    12/01/13 11:50 AM      Result Value Range   LD, Fluid 128 (*) 3 - 23 U/L   Fluid Type-FLDH PLEURAL     Comment: FLUID     LEFT  BODY FLUID CULTURE     Status: None   Collection Time    12/01/13 11:50 AM      Result Value Range   Specimen Description PLEURAL LEFT FLUID     Special Requests 60ML FLUID     Gram Stain       Value: MODERATE WBC PRESENT,BOTH PMN AND MONONUCLEAR     NO ORGANISMS SEEN     Performed at [facility]   Culture       Value: NO GROWTH 4 DAYS  Performed at [facility]   Report Status 12/05/2013 FINAL       RADIOLOGY RESULTS: See E-Chart or I-Site for most recent results.  Images and reports are reviewed.  Dg Chest 1 View  12/01/2013   CLINICAL DATA:  Left pleural effusion. Immediately status post left thoracentesis.  EXAM: CHEST - 1 VIEW  COMPARISON:  Chest x-ray dated 11/28/2013  FINDINGS: There has been almost complete elimination of the left pleural effusion. Minimal residual fluid and atelectasis at the left lung base. No pneumothorax. Right lung is clear. Heart size and vascularity are normal. No osseous abnormality.  IMPRESSION: No pneumothorax after thoracentesis. Minimal residual fluid and atelectasis at the left base.   Electronically Signed   By: Rozetta Nunnery M.D.   On: 12/01/2013 12:18   Dg Chest 2 View  11/28/2013   CLINICAL DATA:  Endoscopic exam 4 days ago, sore throat and fever since  EXAM: CHEST  2 VIEW  COMPARISON:  08/22/2013  FINDINGS: The heart size and vascular pattern are normal. The right lung is clear. On the left side, there is a small to moderate pleural effusion with  underlying consolidation. This is not seen on the prior study.  IMPRESSION: Small to moderate left effusion with underlying consolidation. Pneumonitis is not excluded.   Electronically Signed   By: Skipper Cliche M.D.   On: 11/28/2013 17:07   Ct Chest W Contrast  11/29/2013   CLINICAL DATA:  Newly diagnosed pancreatic cancer. New left pleural effusion.  EXAM: CT CHEST WITH CONTRAST  TECHNIQUE: Multidetector CT imaging of the chest was performed during intravenous contrast administration.  CONTRAST:  28m OMNIPAQUE IOHEXOL 300 MG/ML  SOLN  COMPARISON:  CT of the abdomen and pelvis 11/14/2013.  FINDINGS: Mediastinum: Heart size is normal. There is no significant pericardial fluid, thickening or pericardial calcification. No pathologically enlarged mediastinal hilar lymph nodes. The esophagus is unremarkable in appearance.  Lungs/Pleura: New moderate left-sided pleural effusion layering dependently with associated passive atelectasis in the left lower lobe basal segments and inferior segment of the lingula. No acute consolidative airspace disease. No definite suspicious appearing pulmonary nodules or masses.  Upper Abdomen: Complex cystic lesion emanating from the tail of the pancreas measuring approximately 5.3 x 4.0 x 5.5 cm, extending into the gastrosplenic ligament. Compared to the recent prior examination from 11/14/2013, there is increasing low-attenuation fluid associated with the splenic capsule, which may be reactive, or may be related to extension from the mass in the tail of the pancreas, as this does have a similar appearance to the primary lesion. This extends underneath the left hemidiaphragm. Mild fat stranding is noted adjacent to the tail of the pancreas and in the upper left retroperitoneum, potentially related to inflammation or lymphatic congestion. Geographic low attenuation throughout the right lobe of the liver as compared to the left is favored to represent regional fatty infiltration.   Musculoskeletal: There are no aggressive appearing lytic or blastic lesions noted in the visualized portions of the skeleton.  IMPRESSION: 1. New moderate left pleural effusion with extensive passive atelectasis in the left lung base, as above. 2. No definite signs of metastatic disease to the thorax at this time. 3. Slight interval enlargement of cystic mass in the tail of the pancreas, which now appears likely associated with some subcapsular spread along the posterior and superior aspect of the spleen. Alternatively, the subcapsular splenic fluid collections could be simply reactive fluid or pseudocysts. 4. Probable geographic fatty infiltration of the right lobe of  the liver.   Electronically Signed   By: Vinnie Langton M.D.   On: 11/29/2013 14:23   US Abdomen Complete  11/13/2013   CLINICAL DATA:  Known pancreatic cyst now for follow-up. The patient is reporting left-sided abdominal pain. History of previous pancreatitis and splenic infarction.  EXAM: ULTRASOUND ABDOMEN COMPLETE  COMPARISON:  MRI of the abdomen dated September 03, 2013.  FINDINGS: Gallbladder:  The gallbladder is adequately distended. There are multiple small mobile stones with distal shadowing. There is no gallbladder wall thickening or pericholecystic fluid. There is no positive sonographic Murphy's sign. Next item next  Common bile duct:  Diameter: 2.5 mm  Liver:  No focal lesion identified. Within normal limits in parenchymal echogenicity.  IVC:  No abnormality visualized.  Pancreas:  There is no focal cystic or solid appearing pancreatic mass. There is no pancreatic ductal dilation.  Spleen:  The is spleen is not enlarged. In the splenic hilum there is a hypoechoic focus which is been previously demonstrated and may reflect a lesion in the adjacent pancreatic tail or be related to other structures in the splenic hilum.  Right Kidney:  Length: 10.7 cm. Echogenicity within normal limits. No mass or hydronephrosis visualized.  Left Kidney:   Length: 11.8 cm. Echogenicity within normal limits. No mass or hydronephrosis visualized.  Abdominal aorta:  No aneurysm visualized.  Other findings:  No ascites is demonstrated  IMPRESSION: 1. There are gallstones present without evidence of acute cholecystitis. 2. In the region of the pancreatic tail there is a hypoechoic lobulated appearing focus measuring up to 7.4 cm which is nonspecific. This is the area of abnormalities on the previous MRI. If the patient is having acute symptoms or has laboratory abnormalities, follow-up MRI or abdominal CT scan would be useful. 3. There is no acute abnormality of the liver or kidneys or abdominal aorta or inferior vena cava.   Electronically Signed   By: David  Martinique   On: 11/13/2013 09:14   Ct Abdomen Pelvis W Contrast  11/14/2013   CLINICAL DATA:  Cystic pancreatic lesion seen on ultrasound. Pancreatitis. Cholelithiasis.  EXAM: CT ABDOMEN AND PELVIS WITH CONTRAST  TECHNIQUE: Multidetector CT imaging of the abdomen and pelvis was performed using the standard protocol following bolus administration of intravenous contrast.  CONTRAST:  80 mL Omnipaque 300  COMPARISON:  MRI on 09/03/2013 and CT on 08/22/2013  FINDINGS: Diffuse pancreatic atrophy and mild pancreatic ductal dilatation are again seen, without evidence of pancreatic mass. This is consistent with chronic pancreatitis. There is mild soft tissue stranding surrounding the pancreatic body and tail, suspicious for mild superimposed acute pancreatitis. New rim enhancing fluid collection is seen in the gastrosplenic ligament which measures 3.7 x 4.5 cm, consistent with pancreatic pseudocysts.  No evidence of cholecystitis or biliary dilatation. No liver masses are identified. The spleen is normal in size and appearance. Adrenal glands and kidneys are also normal in appearance. No evidence of hydronephrosis.  No soft tissue masses identified within the abdomen or pelvis. Uterus and adnexal regions are unremarkable in  appearance. No evidence of other inflammatory process or abnormal fluid collections within the pelvis. No evidence of bowel wall thickening or obstruction.  IMPRESSION: Findings suspicious for mild acute on chronic pancreatitis.  New 4.5 cm rim enhancing fluid collection in gastrosplenic ligament, consistent with pancreatic pseudocyst.   Electronically Signed   By: Earle Gell M.D.   On: 11/14/2013 16:14   US Thoracentesis Asp Pleural Space W/img Guide  12/01/2013   CLINICAL  DATA:  Carcinoma of the pancreas, left pleural effusion, shortness of breath, request for diagnostic and therapeutic thoracentesis.  EXAM: ULTRASOUND GUIDED left THORACENTESIS  COMPARISON:  None.  FINDINGS: A total of approximately 400 ml of serous fluid was removed. A fluid sample was sent for laboratory analysis.  IMPRESSION: Successful ultrasound guided left thoracentesis yielding 400 ml of pleural fluid.  Read By:  Tsosie Billing PA-C  PROCEDURE: An ultrasound guided thoracentesis was thoroughly discussed with the patient and questions answered. The benefits, risks, alternatives and complications were also discussed. The patient understands and wishes to proceed with the procedure. Written consent was obtained.  Ultrasound was performed to localize and mark an adequate pocket of fluid in the left chest. The area was then prepped and draped in the normal sterile fashion. 1% Lidocaine was used for local anesthesia. Under ultrasound guidance a 19 gauge Yueh catheter was introduced. Thoracentesis was performed. The catheter was removed and a dressing applied.  Complications:  none.   Electronically Signed   By: Aletta Edouard M.D.   On: 12/01/2013 12:17      ASSESSMENT AND PLAN: Carcinoma of pancreas We will await cytology from the thoracentesis. If negative, will plan diagnostic laparoscopy and open distal pancreatectomy/splenectomy.  Pt will also need cholecystectomy.  Will abort open resection if peritoneal mets are seen, but  would still do cholecystectomy and place port. Pt will need to see genetics since she has personal history of breast cancer and first degree relative with prostate cancer in addition to her diagnosis of pancreatic cancer.   I discussed that due to the location of the mass, we would proceed with open resection.  The mass is directly over the confluence of the vessels.  I think we would be much less likely to get good margins if we attempted to do laparoscopically.  I discussed the surgery with the patient including diagrams of anatomy.  I discussed the potential for diagnostic laparoscopy.  In the case of pancreatic cancer, if spread of the disease is found, we will abort the procedure and not proceed with resection.  The rationale for this was discussed with the patient.  There has not been data to support resection of Stage IV disease in terms of survival benefit.    We discussed possible complications including: Bleeding Infection and possible wound complications such as hernia Damage to adjacent structures Leak of the pancreas Possible need for other procedures Possible prolonged hospital stay Possible development of diabetes or worsening of current diabetes.  Possible pancreatic exocrine insufficiency Prolonged fatigue/weakness/appetite Difficulty with eating or post operative nausea Possible early recurrence of cancer   The patient understands and wishes to proceed.  The patient has been advised to turn in disability paperwork to our office.    45 min spent with exam and counseling.  >50% spent in counseling.        Milus Height MD Surgical On[facility]      Visit Diagnoses: 1. Peripheral edema   2. Carcinoma of pancreas     Primary Care Physician: [name], MD

## 2013-12-21 ENCOUNTER — Inpatient Hospital Stay (HOSPITAL_COMMUNITY): Payer: Medicare HMO

## 2013-12-21 DIAGNOSIS — E440 Moderate protein-calorie malnutrition: Secondary | ICD-10-CM | POA: Insufficient documentation

## 2013-12-21 LAB — GLUCOSE, CAPILLARY
GLUCOSE-CAPILLARY: 128 mg/dL — AB (ref 70–99)
GLUCOSE-CAPILLARY: 128 mg/dL — AB (ref 70–99)
Glucose-Capillary: 127 mg/dL — ABNORMAL HIGH (ref 70–99)
Glucose-Capillary: 129 mg/dL — ABNORMAL HIGH (ref 70–99)

## 2013-12-21 LAB — COMPREHENSIVE METABOLIC PANEL
ALBUMIN: 2.3 g/dL — AB (ref 3.5–5.2)
ALT: 25 U/L (ref 0–35)
AST: 42 U/L — ABNORMAL HIGH (ref 0–37)
Alkaline Phosphatase: 46 U/L (ref 39–117)
BUN: 10 mg/dL (ref 6–23)
CALCIUM: 7.6 mg/dL — AB (ref 8.4–10.5)
CO2: 25 mEq/L (ref 19–32)
Chloride: 104 mEq/L (ref 96–112)
Creatinine, Ser: 0.57 mg/dL (ref 0.50–1.10)
GFR calc non Af Amer: >90 mL/min (ref >90)
GLUCOSE: 205 mg/dL — AB (ref 70–99)
Potassium: 4.9 mEq/L (ref 3.7–5.3)
Sodium: 139 mEq/L (ref 137–147)
Total Bilirubin: 0.7 mg/dL (ref 0.3–1.2)
Total Protein: 5 g/dL — ABNORMAL LOW (ref 6.0–8.3)

## 2013-12-21 LAB — CBC
HCT: 31.2 % — ABNORMAL LOW (ref 36.0–46.0)
HCT: 24.6 % — ABNORMAL LOW (ref 36.0–46.0)
HEMOGLOBIN: 10.7 g/dL — AB (ref 12.0–15.0)
Hemoglobin: 8.6 g/dL — ABNORMAL LOW (ref 12.0–15.0)
MCH: 28.5 pg (ref 26.0–34.0)
MCH: 29 pg (ref 26.0–34.0)
MCHC: 34.3 g/dL (ref 30.0–36.0)
MCHC: 35 g/dL (ref 30.0–36.0)
MCV: 83.2 fL (ref 78.0–100.0)
MCV: 82.8 fL (ref 78.0–100.0)
PLATELETS: 201 10*3/uL (ref 150–400)
Platelets: 225 10*3/uL (ref 150–400)
RBC: 3.75 MIL/uL — ABNORMAL LOW (ref 3.87–5.11)
RBC: 2.97 MIL/uL — AB (ref 3.87–5.11)
RDW: 16.2 % — ABNORMAL HIGH (ref 11.5–15.5)
RDW: 16.1 % — ABNORMAL HIGH (ref 11.5–15.5)
WBC: 14.2 10*3/uL — ABNORMAL HIGH (ref 4.0–10.5)
WBC: 13 10*3/uL — AB (ref 4.0–10.5)

## 2013-12-21 LAB — PHOSPHORUS: PHOSPHORUS: 4.2 mg/dL (ref 2.3–4.6)

## 2013-12-21 LAB — PROTIME-INR
INR: 1.24 (ref 0.00–1.49)
Prothrombin Time: 15.3 seconds — ABNORMAL HIGH (ref 11.6–15.2)

## 2013-12-21 LAB — APTT: aPTT: 30 seconds (ref 24–37)

## 2013-12-21 LAB — MAGNESIUM: MAGNESIUM: 1.6 mg/dL (ref 1.5–2.5)

## 2013-12-21 LAB — MRSA PCR SCREENING: MRSA by PCR: NEGATIVE

## 2013-12-21 MED ORDER — SODIUM CHLORIDE 0.9 % IJ SOLN: 9.0000 mL | INTRAMUSCULAR | Status: DC | PRN

## 2013-12-21 MED ORDER — NALOXONE HCL 0.4 MG/ML IJ SOLN: 0.4000 mg | INTRAMUSCULAR | Status: DC | PRN

## 2013-12-21 MED ORDER — DIPHENHYDRAMINE HCL 12.5 MG/5ML PO ELIX
12.5000 mg | ORAL_SOLUTION | Freq: Four times a day (QID) | ORAL | Status: DC | PRN
  Filled 2013-12-21: qty 5

## 2013-12-21 MED ORDER — INSULIN ASPART 100 UNIT/ML ~~LOC~~ SOLN
0.0000 [IU] | SUBCUTANEOUS | Status: DC
Start: 2013-12-21 — End: 2013-12-26
  Administered 2013-12-21 – 2013-12-22 (×6): 1 [IU] via SUBCUTANEOUS
  Administered 2013-12-22: 2 [IU] via SUBCUTANEOUS
  Administered 2013-12-22 – 2013-12-23 (×2): 1 [IU] via SUBCUTANEOUS
  Administered 2013-12-23: 2 [IU] via SUBCUTANEOUS
  Administered 2013-12-23 – 2013-12-24 (×8): 1 [IU] via SUBCUTANEOUS

## 2013-12-21 MED ORDER — DIPHENHYDRAMINE HCL 50 MG/ML IJ SOLN: 12.5000 mg | Freq: Four times a day (QID) | INTRAMUSCULAR | Status: DC | PRN

## 2013-12-21 MED ORDER — CHLORHEXIDINE GLUCONATE 0.12 % MT SOLN
15.0000 mL | Freq: Two times a day (BID) | OROMUCOSAL | Status: DC
  Administered 2013-12-21 – 2013-12-25 (×5): 15 mL via OROMUCOSAL
  Filled 2013-12-21 (×9): qty 15

## 2013-12-21 MED ORDER — ALBUMIN HUMAN 25 % IV SOLN
25.0000 g | Freq: Four times a day (QID) | INTRAVENOUS | Status: AC
  Administered 2013-12-21 – 2013-12-23 (×8): 25 g via INTRAVENOUS
  Filled 2013-12-21 (×8): qty 100

## 2013-12-21 MED ORDER — MAGNESIUM SULFATE 40 MG/ML IJ SOLN
2.0000 g | Freq: Once | INTRAMUSCULAR | Status: AC
  Administered 2013-12-21: 2 g via INTRAVENOUS
  Filled 2013-12-21: qty 50

## 2013-12-21 MED ORDER — SODIUM CHLORIDE 0.9 % IJ SOLN
10.0000 mL | Freq: Two times a day (BID) | INTRAMUSCULAR | Status: DC
  Administered 2013-12-21 – 2013-12-24 (×6): 10 mL

## 2013-12-21 MED ORDER — SODIUM CHLORIDE 0.9 % IJ SOLN
10.0000 mL | INTRAMUSCULAR | Status: DC | PRN
  Administered 2013-12-22 – 2013-12-25 (×4): 10 mL

## 2013-12-21 MED ORDER — SODIUM CHLORIDE 0.9 % IV BOLUS (SEPSIS): 1000.0000 mL | Freq: Once | INTRAVENOUS | Status: DC

## 2013-12-21 MED ORDER — ONDANSETRON HCL 4 MG/2ML IJ SOLN: 4.0000 mg | Freq: Four times a day (QID) | INTRAMUSCULAR | Status: DC | PRN

## 2013-12-21 MED ORDER — HYDROMORPHONE 0.3 MG/ML IV SOLN
INTRAVENOUS | Status: DC
  Administered 2013-12-21: 11:00:00 via INTRAVENOUS
  Administered 2013-12-21: 0.6 mg via INTRAVENOUS
  Administered 2013-12-21: 1.2 mg via INTRAVENOUS
  Administered 2013-12-21: 0.9 mg via INTRAVENOUS
  Administered 2013-12-22: 1.2 mg via INTRAVENOUS
  Administered 2013-12-22: 1.1 mg via INTRAVENOUS
  Administered 2013-12-22: 06:00:00 via INTRAVENOUS
  Administered 2013-12-22: 1.1 mg via INTRAVENOUS
  Administered 2013-12-22 (×2): 1.5 mg via INTRAVENOUS
  Administered 2013-12-22 – 2013-12-23 (×2): 1.2 mg via INTRAVENOUS
  Administered 2013-12-23: 0.3 mg via INTRAVENOUS
  Filled 2013-12-21 (×2): qty 25

## 2013-12-21 NOTE — Progress Notes (Signed)
Peripherally Inserted Central Catheter/Midline Placement  The IV Nurse has discussed with the patient and/or persons authorized to consent for the patient, the purpose of this procedure and the potential benefits and risks involved with this procedure.  The benefits include less needle sticks, lab draws from the catheter and patient may be discharged home with the catheter.  Risks include, but not limited to, infection, bleeding, blood clot (thrombus formation), and puncture of an artery; nerve damage and irregular heat beat.  Alternatives to this procedure were also discussed.  PICC/Midline Placement Documentation        Annette Beltran 12/21/2013, 12:30 PM

## 2013-12-21 NOTE — Progress Notes (Signed)
I agree with the Student-Dietitian note and made appropriate revisions.  Katie Archie Atilano, RD, LDN Pager #: [phone] After-Hours Pager #: [phone]  

## 2013-12-21 NOTE — Progress Notes (Signed)
INITIAL NUTRITION ASSESSMENT  DOCUMENTATION CODES Per approved criteria  -Non-severe (moderate) malnutrition in the context of chronic illness   INTERVENTION:  Advance diet as medically appropriate  Add PO supplement, such as Resource Breeze BID, if PO intake is poor   NUTRITION DIAGNOSIS: Increased nutrient needs related to chronic illness as evidenced by estimated nutrition needs.   Goal: Patient to meet >/=90% of estimated nutrition needs.  Monitor:  PO diet initiation, I/Os, weight trends, lab trends  Reason for Assessment: Malnutrition Screening Tool Risk  68 y.o. female  Admitting Dx: Pancreatic Cancer  ASSESSMENT: 68 yo F who presents with history of splenic infarct and abdominal pain. She had worsening of the pain which seemed to be left chest pain. She went to the ED and got a chest CT which was negative for PE. She continued to have horrible pain, and an abd ct was ordered the next day by her PCP. This demonstrated a cystic mass in the tail of the pancreas and pancreatitis. She subsequently underwent an endoscopic ultrasound and was found to a mass in the center of the pancreas. 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  Patient presented for surgery on 2/18.  Procedures: Diagnostic laparoscopy, laparoscopic cholecystectomy, open extended distal pancreatectomy, splenectomy, partial gastrectomy x2 Findings: Sequelae of pancreatitis and pseudocyst with spleen adherent to stomach. Firm mass in body of pancreas. No evidence of metastatic disease. Chronic cholecystitis. Final pancreatic margin on top of SMV.   Patient is currently 1 day post op. Patient is currently NPO. Patient reported eating well PTA with 3 meals and snacks daily. Patient has been following a low fat diet due to pancreatitis. Dietary recall revealed patient is competent in understanding a low fat diet. Meals described are well balanced but portions described sounded inadequate to meet energy needs.    Patient stated that she was not eating well back in September due to illness. Patient was unsure of usual body weight but thought she might be around 140 lb.  Patient reported that she believes she has lost 28 lb. Per medical record, current weight trend is stable. Noted patient seen by outpatient [place] RD. Per RD note, patient's usual body weight is 175 lb. Exact percent weight loss unknown at this time.   Patient's family expressed concerns that she might not eat well after the current surgery as the doctor mentioned the patient might have some taste changes. Patient was agreeable to [employer] once PO diet advances if intake is poor.  Nutrition Focused Physical Exam:  Subcutaneous Fat:  Orbital Region: WNL Upper Arm Region: mild depletion Thoracic and Lumbar Region: N/A  Muscle:  Temple Region: WNL Clavicle Bone Region: mild depletion Clavicle and Acromion Bone Region: mild depletion Scapular Bone Region: N/A Dorsal Hand: mild depletion Patellar Region: N/A Anterior Thigh Region: N/A Posterior Calf Region: N/A  Edema: none noted  Patient meets criteria for non-severe (moderate) malnutrition in the context of chronic disease as evidenced by mild body fat and muscle mass depletion.  Height: Ht Readings from Last 1 Encounters:  12/20/13 5\' 5"  (1.651 m)    Weight: Wt Readings from Last 1 Encounters:  12/20/13 167 lb 15.9 oz (76.2 kg)    Ideal Body Weight: 125 lb (56.8 kg)  % Ideal Body Weight: 134%  Wt Readings from Last 10 Encounters:  12/20/13 167 lb 15.9 oz (76.2 kg)  12/20/13 167 lb 15.9 oz (76.2 kg)  12/14/13 156 lb (70.761 kg)  12/04/13 156 lb 3.2 oz (70.852 kg)  11/30/13 158 lb 3.2 oz (71.759 kg)  11/28/13 160 lb (72.576 kg)  11/23/13 152 lb (68.947 kg)  11/23/13 152 lb (68.947 kg)  11/15/13 159 lb (72.122 kg)  10/24/13 165 lb 6.4 oz (75.025 kg)    Usual Body Weight: 140 lb (63.6 kg)  % Usual Body Weight: 119%  BMI:  Body mass index  is 27.96 kg/(m^2).  Estimated Nutritional Needs: Kcal: 2200-2400 Protein: 115-125 grams Fluid: 2.2-2.4 L  Skin: abdominal incision-2 ports, closed system drain, pain pump  Diet Order: NPO  EDUCATION NEEDS: -No education needs identified at this time   Intake/Output Summary (Last 24 hours) at 12/21/13 0935 Last data filed at 12/21/13 0900  Gross per 24 hour  Intake   7400 ml  Output   1620 ml  Net   5780 ml    Last BM: 2/16  Labs:   Recent Labs Lab 12/14/13 1148  12/20/13 1636 12/20/13 1706 12/21/13 0450  NA 143  < > 140 141 139  K 4.1  < > 3.5* 3.9 4.9  CL 100  --   --  105 104  CO2 28  --   --  24 25  BUN 11  --   --  12 10  CREATININE 0.69  --   --  0.50 0.57  CALCIUM 9.5  --   --  7.8* 7.6*  MG  --   --   --  1.4* 1.6  PHOS  --   --   --   --  4.2  GLUCOSE 85  --   --  193* 205*  < > = values in this interval not displayed.  CBG (last 3)  No results found for this basename: GLUCAP,  in the last 72 hours  Scheduled Meds: . acetaminophen  1,000 mg Intravenous 4 times per day  . albumin human  25 g Intravenous Q6H  . HYDROmorphone PCA 0.3 mg/mL   Intravenous 6 times per day  . insulin aspart  0-9 Units Subcutaneous 6 times per day  . magnesium sulfate 1 - 4 g bolus IVPB  2 g Intravenous Once  . pantoprazole (PROTONIX) IV  40 mg Intravenous QHS  . sodium chloride  1,000 mL Intravenous Once    Continuous Infusions: . bupivacaine ON-Q pain pump    . dextrose 5 % and 0.45 % NaCl with KCl 20 mEq/L 100 mL/hr at 12/20/13 2000    Past Medical History  Diagnosis Date  . Macular degeneration   . Fluid retention   . Colitis, collagenous   . Vitamin D deficiency   . Osteopenia   . Celiac artery stenosis   . Splenic infarction   . Arthritis   . History of blood clots   . Anxiety   . Shingles   . Pancreatitis   . Osteoporosis   . Clotting disorder   . Breast cancer     rt lumpectomy  . Arrhythmia     "skips a beat"  [place]  heart    Past  Surgical History  Procedure Laterality Date  . Foot surgery  2003    x 3, 2 on right, 1 on left  . Breast lumpectomy Right     radiation for 6 weeks  . Meniscus repair Right   . Eus N/A 11/23/2013    Procedure: UPPER ENDOSCOPIC ULTRASOUND (EUS) LINEAR;  Surgeon: Milus Banister, MD;  Location: [facility];  Service: Endoscopy;  Laterality: N/A;  . Tonsillectomy      Caryl Pina  Marica Otter, Dietetic Intern Pager: [phone]

## 2013-12-21 NOTE — Progress Notes (Signed)
1 Day Post-Op  Subjective: Very sore, BP soft overnight.   Objective: Vital signs in last 24 hours: Temp:  [97.3 F (36.3 C)-98.3 F (36.8 C)] 98.3 F (36.8 C) (02/19 0735) Pulse Rate:  [57-86] 62 (02/19 0800) Resp:  [10-31] 15 (02/19 0800) BP: (80-114)/(34-51) 89/36 mmHg (02/19 0800) SpO2:  [96 %-100 %] 99 % (02/19 0800) Arterial Line BP: (61-129)/(41-72) 61/52 mmHg (02/19 0800) Weight:  [167 lb 15.9 oz (76.2 kg)] 167 lb 15.9 oz (76.2 kg) (02/18 2000) Last BM Date: 12/18/13  Intake/Output from previous day: 02/18 0701 - 02/19 0700 In: 7400 [I.V.:4800; Blood:670; NG/GT:30; IV Piggyback:1900] Out: 1460 [Urine:660; Emesis/NG output:150; Drains:250; Blood:400] Intake/Output this shift: Total I/O In: -  Out: 75 [Urine:75]  General appearance: alert, cooperative and mild distress Resp: breathing comfortably Cardio: regular rate and rhythm GI: soft, approp tender, JP serosang  Lab Results:   Recent Labs  12/20/13 1706 12/21/13 0450  WBC 20.4* 14.2*  HGB 11.3* 10.7*  HCT 32.2* 31.2*  PLT 228 225   BMET  Recent Labs  12/20/13 1706 12/21/13 0450  NA 141 139  K 3.9 4.9  CL 105 104  CO2 24 25  GLUCOSE 193* 205*  BUN 12 10  CREATININE 0.50 0.57  CALCIUM 7.8* 7.6*   PT/INR  Recent Labs  12/20/13 1706 12/21/13 0450  LABPROT 15.3* 15.3*  INR 1.24 1.24   ABG  Recent Labs  12/20/13 1504 12/20/13 1636  PHART 7.456* 7.415  HCO3 21.4 24.9*    Studies/Results: No results found.  Anti-infectives: Anti-infectives   Start     Dose/Rate Route Frequency Ordered Stop   12/20/13 2000  cefOXitin (MEFOXIN) 1 g in dextrose 5 % 50 mL IVPB     1 g 100 mL/hr over 30 Minutes Intravenous Every 6 hours 12/20/13 1900 12/21/13 0830   12/20/13 1215  cefOXitin (MEFOXIN) 2 g in dextrose 5 % 50 mL IVPB     2 g 100 mL/hr over 30 Minutes Intravenous To Surgery 12/20/13 1202 12/20/13 1221      Assessment/Plan: s/p Procedure(s): LAPAROSCOPY DIAGNOSTIC  (N/A) OPEN  DISTAL PANCREATECTOMY (N/A) LAPAROSCOPIC CHOLECYSTECTOMY (N/A) SPLENECTOMY (N/A) PARTIAL GASTRECTOMY (N/A) Continue foley due to strict I&O, patient critically ill, patient in ICU and urinary output monitoring Add albumin  PICC line for poor IV access and CVP monitoring. OOB Change to dilaudid pca RISS for hyperglycemia.   LOS: 1 day    [facility] 12/21/2013

## 2013-12-22 ENCOUNTER — Other Ambulatory Visit: Payer: Medicare HMO

## 2013-12-22 ENCOUNTER — Encounter (HOSPITAL_COMMUNITY): Payer: Self-pay | Admitting: General Surgery

## 2013-12-22 LAB — GLUCOSE, CAPILLARY
GLUCOSE-CAPILLARY: 150 mg/dL — AB (ref 70–99)
GLUCOSE-CAPILLARY: 137 mg/dL — AB (ref 70–99)
GLUCOSE-CAPILLARY: 100 mg/dL — AB (ref 70–99)
Glucose-Capillary: 130 mg/dL — ABNORMAL HIGH (ref 70–99)
Glucose-Capillary: 154 mg/dL — ABNORMAL HIGH (ref 70–99)

## 2013-12-22 LAB — BASIC METABOLIC PANEL
BUN: 4 mg/dL — ABNORMAL LOW (ref 6–23)
CHLORIDE: 107 meq/L (ref 96–112)
CO2: 25 mEq/L (ref 19–32)
CREATININE: 0.57 mg/dL (ref 0.50–1.10)
Calcium: 7.8 mg/dL — ABNORMAL LOW (ref 8.4–10.5)
GFR calc non Af Amer: >90 mL/min (ref >90)
Glucose, Bld: 159 mg/dL — ABNORMAL HIGH (ref 70–99)
POTASSIUM: 4.3 meq/L (ref 3.7–5.3)
Sodium: 142 mEq/L (ref 137–147)

## 2013-12-22 LAB — CBC
HCT: 26.8 % — ABNORMAL LOW (ref 36.0–46.0)
Hemoglobin: 9.1 g/dL — ABNORMAL LOW (ref 12.0–15.0)
MCH: 28.7 pg (ref 26.0–34.0)
MCHC: 34 g/dL (ref 30.0–36.0)
MCV: 84.5 fL (ref 78.0–100.0)
Platelets: 226 10*3/uL (ref 150–400)
RBC: 3.17 MIL/uL — ABNORMAL LOW (ref 3.87–5.11)
RDW: 16.4 % — AB (ref 11.5–15.5)
WBC: 19.9 10*3/uL — AB (ref 4.0–10.5)

## 2013-12-22 LAB — HEMOGLOBIN A1C
Hgb A1c MFr Bld: 5.6 % (ref <5.7)
Mean Plasma Glucose: 114 mg/dL (ref <117)

## 2013-12-22 MED ORDER — OXYCODONE-ACETAMINOPHEN 5-325 MG PO TABS
1.0000 | ORAL_TABLET | Freq: Four times a day (QID) | ORAL | Status: DC | PRN
  Administered 2013-12-23 (×2): 1 via ORAL
  Administered 2013-12-24 – 2013-12-27 (×8): 2 via ORAL
  Filled 2013-12-22: qty 1
  Filled 2013-12-22 (×9): qty 2

## 2013-12-22 MED ORDER — MEPERIDINE HCL 50 MG PO TABS: 50.0000 mg | ORAL_TABLET | Freq: Four times a day (QID) | ORAL | Status: DC | PRN

## 2013-12-22 MED ORDER — ALPRAZOLAM 0.5 MG PO TABS
0.5000 mg | ORAL_TABLET | Freq: Two times a day (BID) | ORAL | Status: DC
  Administered 2013-12-22 (×2): 0.5 mg via ORAL
  Filled 2013-12-22 (×3): qty 1

## 2013-12-22 MED ORDER — PAROXETINE HCL 10 MG PO TABS
10.0000 mg | ORAL_TABLET | Freq: Every day | ORAL | Status: DC
  Administered 2013-12-22 – 2013-12-27 (×6): 10 mg via ORAL
  Filled 2013-12-22 (×6): qty 1

## 2013-12-22 NOTE — Progress Notes (Signed)
Pt arrived from 2S, VSS, will continue to monitor. 

## 2013-12-22 NOTE — Progress Notes (Signed)
Patient ID: Annette Beltran, female   DOB: 1946-06-11, 68 y.o.   MRN: [id number] 2 Days Post-Op  Subjective: Continues to be sore.  Denies nausea or vomiting.  OOB again.  BP improved with albumin.    Objective: Vital signs in last 24 hours: Temp:  [98.1 F (36.7 C)-101 F (38.3 C)] 99.3 F (37.4 C) (02/20 0801) Pulse Rate:  [48-83] 79 (02/20 0700) Resp:  [14-28] 22 (02/20 0700) BP: (78-149)/(25-74) 148/55 mmHg (02/20 0700) SpO2:  [76 %-99 %] 89 % (02/20 0700) Arterial Line BP: (58-107)/(33-71) 80/33 mmHg (02/19 1600) Weight:  [167 lb 1.7 oz (75.8 kg)] 167 lb 1.7 oz (75.8 kg) (02/20 0600) Last BM Date: 12/18/13  Intake/Output from previous day: 02/19 0701 - 02/20 0700 In: 2630 [I.V.:2400; NG/GT:30; IV Piggyback:200] Out: 925 [Urine:845; Drains:80] Intake/Output this shift:    General appearance: alert, cooperative and no distress Resp: breathing comfortably Cardio: regular rate and rhythm GI: soft, approp tender, JP serosang  Lab Results:   Recent Labs  12/21/13 1430 12/22/13 0345  WBC 13.0* 19.9*  HGB 8.6* 9.1*  HCT 24.6* 26.8*  PLT 201 226   BMET  Recent Labs  12/21/13 0450 12/22/13 0345  NA 139 142  K 4.9 4.3  CL 104 107  CO2 25 25  GLUCOSE 205* 159*  BUN 10 4*  CREATININE 0.57 0.57  CALCIUM 7.6* 7.8*   PT/INR  Recent Labs  12/20/13 1706 12/21/13 0450  LABPROT 15.3* 15.3*  INR 1.24 1.24   ABG  Recent Labs  12/20/13 1504 12/20/13 1636  PHART 7.456* 7.415  HCO3 21.4 24.9*    Studies/Results: Dg Chest Port 1 View  12/21/2013   CLINICAL DATA:  Line placement, history pancreatic cancer, post cholecystectomy, pancreatectomy, splenectomy and partial gastrectomy on 12/20/2013  EXAM: PORTABLE CHEST - 1 VIEW  COMPARISON:  Portable exam 1247 hr compared to 12/01/2013  FINDINGS: Left arm PICC line tip projects over cavoatrial junction.  Nasogastric tube projects over stomach with tip at antrum.  Surgical drain left upper quadrant.  Upper abdominal skin  clips and free air noted consistent with preceding abdominal surgery.  Enlargement of cardiac silhouette.  Mild right basilar atelectasis.  Atelectasis versus consolidation left lower lobe.  Upper lungs clear.  IMPRESSION: Enlargement of cardiac silhouette.  Mild right basilar atelectasis.  Atelectasis versus consolidation left lower lobe.   Electronically Signed   By: Lavonia Dana M.D.   On: 12/21/2013 13:18    Anti-infectives: Anti-infectives   Start     Dose/Rate Route Frequency Ordered Stop   12/20/13 2000  cefOXitin (MEFOXIN) 1 g in dextrose 5 % 50 mL IVPB     1 g 100 mL/hr over 30 Minutes Intravenous Every 6 hours 12/20/13 1900 12/21/13 0830   12/20/13 1215  cefOXitin (MEFOXIN) 2 g in dextrose 5 % 50 mL IVPB     2 g 100 mL/hr over 30 Minutes Intravenous To Surgery 12/20/13 1202 12/20/13 1221      Assessment/Plan: s/p Procedure(s): LAPAROSCOPY DIAGNOSTIC  (N/A) OPEN DISTAL PANCREATECTOMY (N/A) LAPAROSCOPIC CHOLECYSTECTOMY (N/A) SPLENECTOMY (N/A) PARTIAL GASTRECTOMY (N/A) Continue foley due to strict I&O, patient critically ill, patient in ICU and urinary output monitoring 24 hours more albumin.  PICC line for poor IV access and CVP monitoring. OOB PCA Add paxil back. Transfer to stepdown Think leukocytosis reflective of splenectomy.  RISS for hyperglycemia.   LOS: 2 days    [facility] 12/22/2013

## 2013-12-22 NOTE — Plan of Care (Signed)
Problem: Phase I Progression Outcomes Goal: Initial discharge plan identified Outcome: Completed/Met Date Met:  12/22/13 Home with spouse

## 2013-12-23 ENCOUNTER — Inpatient Hospital Stay (HOSPITAL_COMMUNITY): Payer: Medicare HMO

## 2013-12-23 LAB — CBC
HCT: 23.9 % — ABNORMAL LOW (ref 36.0–46.0)
Hemoglobin: 7.9 g/dL — ABNORMAL LOW (ref 12.0–15.0)
MCH: 28.3 pg (ref 26.0–34.0)
MCHC: 33.1 g/dL (ref 30.0–36.0)
MCV: 85.7 fL (ref 78.0–100.0)
PLATELETS: 223 10*3/uL (ref 150–400)
RBC: 2.79 MIL/uL — ABNORMAL LOW (ref 3.87–5.11)
RDW: 16.6 % — ABNORMAL HIGH (ref 11.5–15.5)
WBC: 19.5 10*3/uL — ABNORMAL HIGH (ref 4.0–10.5)

## 2013-12-23 LAB — BASIC METABOLIC PANEL
BUN: 4 mg/dL — ABNORMAL LOW (ref 6–23)
CALCIUM: 8.4 mg/dL (ref 8.4–10.5)
CO2: 26 meq/L (ref 19–32)
CREATININE: 0.52 mg/dL (ref 0.50–1.10)
Chloride: 102 mEq/L (ref 96–112)
GFR calc Af Amer: >90 mL/min (ref >90)
GFR calc non Af Amer: >90 mL/min (ref >90)
Glucose, Bld: 144 mg/dL — ABNORMAL HIGH (ref 70–99)
Potassium: 4.3 mEq/L (ref 3.7–5.3)
Sodium: 138 mEq/L (ref 137–147)

## 2013-12-23 LAB — GLUCOSE, CAPILLARY
GLUCOSE-CAPILLARY: 139 mg/dL — AB (ref 70–99)
GLUCOSE-CAPILLARY: 155 mg/dL — AB (ref 70–99)
Glucose-Capillary: 127 mg/dL — ABNORMAL HIGH (ref 70–99)
Glucose-Capillary: 144 mg/dL — ABNORMAL HIGH (ref 70–99)
Glucose-Capillary: 127 mg/dL — ABNORMAL HIGH (ref 70–99)
Glucose-Capillary: 122 mg/dL — ABNORMAL HIGH (ref 70–99)

## 2013-12-23 MED ORDER — ACETAMINOPHEN 325 MG PO TABS
650.0000 mg | ORAL_TABLET | Freq: Four times a day (QID) | ORAL | Status: DC | PRN
  Administered 2013-12-23 (×3): 650 mg via ORAL
  Filled 2013-12-23 (×2): qty 2

## 2013-12-23 MED ORDER — ALPRAZOLAM 0.5 MG PO TABS
0.5000 mg | ORAL_TABLET | Freq: Two times a day (BID) | ORAL | Status: DC | PRN
  Administered 2013-12-23 – 2013-12-26 (×3): 0.5 mg via ORAL
  Filled 2013-12-23 (×3): qty 1

## 2013-12-23 MED ORDER — FUROSEMIDE 10 MG/ML IJ SOLN
20.0000 mg | Freq: Once | INTRAMUSCULAR | Status: AC
Start: 2013-12-23 — End: 2013-12-23
  Administered 2013-12-23: 20 mg via INTRAVENOUS

## 2013-12-23 MED ORDER — ENOXAPARIN SODIUM 40 MG/0.4ML ~~LOC~~ SOLN
40.0000 mg | SUBCUTANEOUS | Status: DC
  Administered 2013-12-23 – 2013-12-26 (×4): 40 mg via SUBCUTANEOUS
  Filled 2013-12-23 (×7): qty 0.4

## 2013-12-23 MED ORDER — HYDROMORPHONE HCL PF 1 MG/ML IJ SOLN
1.0000 mg | INTRAMUSCULAR | Status: DC | PRN
  Administered 2013-12-24 (×2): 1 mg via INTRAVENOUS
  Filled 2013-12-23 (×2): qty 1

## 2013-12-23 NOTE — Progress Notes (Signed)
Patient ID: Annette Beltran, female   DOB: 22-Dec-1945, 68 y.o.   MRN: [id number] Desaturated with ambulation.  History of pulmonary effusion.  CXR shows atelectasis and enlargement of cardiac silloutte. 9 L positive.  Will try diuresis. Continue O2 therapy and cut back fluid.

## 2013-12-23 NOTE — Progress Notes (Addendum)
Pt walking in hall sats dropped, increased oxygen to 4l/min. Notified dr Brantley Stage regarding sats dropping new orders to get cxr. Will continue to monitor.

## 2013-12-23 NOTE — Progress Notes (Signed)
Report called to Jeanae rn, transferring to [id number] via w/c with husband and belongings.

## 2013-12-23 NOTE — Progress Notes (Signed)
3 Days Post-Op  Subjective: No flatus.  Feels a little bloated. No BM. Not vomiting.   Objective: Vital signs in last 24 hours: Temp:  [97.5 F (36.4 C)-99.5 F (37.5 C)] 97.5 F (36.4 C) (02/21 0731) Pulse Rate:  [54-79] 76 (02/21 0731) Resp:  [20-27] 22 (02/21 0731) BP: (139-161)/(50-67) 139/50 mmHg (02/21 0731) SpO2:  [88 %-100 %] 96 % (02/21 0731) Last BM Date: 12/18/13  Intake/Output from previous day: 02/20 0701 - 02/21 0700 In: 2874.3 [P.O.:240; I.V.:2334.3; IV Piggyback:300] Out: 922.5 [Urine:870; Drains:52.5] Intake/Output this shift: Total I/O In: 235 [P.O.:30; I.V.:205] Out: 80 [Urine:75; Drains:5]  Incision/Wound:abdomen with mild distension. Wound C/D/I.  On Q pumps in place. No peritonitis.   Lab Results:   Recent Labs  12/22/13 0345 12/23/13 0422  WBC 19.9* 19.5*  HGB 9.1* 7.9*  HCT 26.8* 23.9*  PLT 226 223   BMET  Recent Labs  12/22/13 0345 12/23/13 0422  NA 142 138  K 4.3 4.3  CL 107 102  CO2 25 26  GLUCOSE 159* 144*  BUN 4* 4*  CREATININE 0.57 0.52  CALCIUM 7.8* 8.4   PT/INR  Recent Labs  12/20/13 1706 12/21/13 0450  LABPROT 15.3* 15.3*  INR 1.24 1.24   ABG  Recent Labs  12/20/13 1504 12/20/13 1636  PHART 7.456* 7.415  HCO3 21.4 24.9*    Studies/Results: Dg Chest Port 1 View  12/21/2013   CLINICAL DATA:  Line placement, history pancreatic cancer, post cholecystectomy, pancreatectomy, splenectomy and partial gastrectomy on 12/20/2013  EXAM: PORTABLE CHEST - 1 VIEW  COMPARISON:  Portable exam 1247 hr compared to 12/01/2013  FINDINGS: Left arm PICC line tip projects over cavoatrial junction.  Nasogastric tube projects over stomach with tip at antrum.  Surgical drain left upper quadrant.  Upper abdominal skin clips and free air noted consistent with preceding abdominal surgery.  Enlargement of cardiac silhouette.  Mild right basilar atelectasis.  Atelectasis versus consolidation left lower lobe.  Upper lungs clear.  IMPRESSION:  Enlargement of cardiac silhouette.  Mild right basilar atelectasis.  Atelectasis versus consolidation left lower lobe.   Electronically Signed   By: Lavonia Dana M.D.   On: 12/21/2013 13:18    Anti-infectives: Anti-infectives   Start     Dose/Rate Route Frequency Ordered Stop   12/20/13 2000  cefOXitin (MEFOXIN) 1 g in dextrose 5 % 50 mL IVPB     1 g 100 mL/hr over 30 Minutes Intravenous Every 6 hours 12/20/13 1900 12/21/13 0830   12/20/13 1215  cefOXitin (MEFOXIN) 2 g in dextrose 5 % 50 mL IVPB     2 g 100 mL/hr over 30 Minutes Intravenous To Surgery 12/20/13 1202 12/20/13 1221      Assessment/Plan: s/p Procedure(s): LAPAROSCOPY DIAGNOSTIC  (N/A) OPEN DISTAL PANCREATECTOMY (N/A) LAPAROSCOPIC CHOLECYSTECTOMY (N/A) SPLENECTOMY (N/A) PARTIAL GASTRECTOMY (N/A) Looks ok To floor D/C PCA PT D/C ON Q CLEARS ONLY PATH  T1 positive margin N0.  Not reviewed with pt today.  Leukocytosis stable pt had splenectomy.  Anemia  ABL  Stable follow H/H.  Some dilutional component.   LOS: 3 days    Avyanna Spada A. 12/23/2013

## 2013-12-24 LAB — CBC
HCT: 25.2 % — ABNORMAL LOW (ref 36.0–46.0)
HEMOGLOBIN: 8.5 g/dL — AB (ref 12.0–15.0)
MCH: 28.5 pg (ref 26.0–34.0)
MCHC: 33.7 g/dL (ref 30.0–36.0)
MCV: 84.6 fL (ref 78.0–100.0)
Platelets: 342 10*3/uL (ref 150–400)
RBC: 2.98 MIL/uL — ABNORMAL LOW (ref 3.87–5.11)
RDW: 15.9 % — ABNORMAL HIGH (ref 11.5–15.5)
WBC: 15.1 10*3/uL — ABNORMAL HIGH (ref 4.0–10.5)

## 2013-12-24 LAB — TYPE AND SCREEN
ABO/RH(D): A POS
ANTIBODY SCREEN: NEGATIVE
UNIT DIVISION: 0
UNIT DIVISION: 0
Unit division: 0
Unit division: 0
Unit division: 0
Unit division: 0

## 2013-12-24 LAB — GLUCOSE, CAPILLARY
GLUCOSE-CAPILLARY: 124 mg/dL — AB (ref 70–99)
Glucose-Capillary: 100 mg/dL — ABNORMAL HIGH (ref 70–99)
Glucose-Capillary: 104 mg/dL — ABNORMAL HIGH (ref 70–99)
Glucose-Capillary: 113 mg/dL — ABNORMAL HIGH (ref 70–99)
Glucose-Capillary: 122 mg/dL — ABNORMAL HIGH (ref 70–99)
Glucose-Capillary: 132 mg/dL — ABNORMAL HIGH (ref 70–99)
Glucose-Capillary: 141 mg/dL — ABNORMAL HIGH (ref 70–99)

## 2013-12-24 LAB — BASIC METABOLIC PANEL
BUN: 6 mg/dL (ref 6–23)
CALCIUM: 8.4 mg/dL (ref 8.4–10.5)
CO2: 29 mEq/L (ref 19–32)
Chloride: 101 mEq/L (ref 96–112)
Creatinine, Ser: 0.5 mg/dL (ref 0.50–1.10)
GFR calc Af Amer: >90 mL/min (ref >90)
GLUCOSE: 124 mg/dL — AB (ref 70–99)
Potassium: 4 mEq/L (ref 3.7–5.3)
Sodium: 141 mEq/L (ref 137–147)

## 2013-12-24 MED ORDER — FUROSEMIDE 10 MG/ML IJ SOLN
20.0000 mg | Freq: Once | INTRAMUSCULAR | Status: AC
  Administered 2013-12-24: 20 mg via INTRAVENOUS
  Filled 2013-12-24: qty 2

## 2013-12-24 NOTE — Progress Notes (Signed)
Physical Therapy Evaluation Patient Details Name: Annette Beltran MRN: [id number] DOB: [birth year] Today's Date: 12/24/2013 Time: 4103-0131 PT Time Calculation (min): 26 min  PT Assessment / Plan / Recommendation History of Present Illness  Pt is a 68 yo F who presents with history of splenic infarct and abdominal pain.  She had worsening of the pain which seemed to be left chest pain.  She went to the ED and got a chest CT which was negative for PE.  She continued to have horrible pain, and an abd ct was ordered the next day by her PCP.  This demonstrated a cystic mass in the tail of the pancreas and pancreatitis.  She subsequently underwent an endoscopic ultrasound and was found to a mass in the center of the pancreas.  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  Patient is now s/p Diagnostic laparoscopy, laparoscopic cholecystectomy, open extended distal pancreatectomy, splenectomy, partial gastrectomy x2, intraoperative ultrasound of pancreas.  Clinical Impression  Patient presents with problems listed below.  Will benefit from acute PT to maximize independence prior to discharge.  Recommend HHPT for continued therapy at discharge.    PT Assessment  Patient needs continued PT services    Follow Up Recommendations  Home health PT;Supervision/Assistance - 24 hour    Does the patient have the potential to tolerate intense rehabilitation      Barriers to Discharge        Equipment Recommendations  3in1 (PT);Hospital bed;Other (comment) (shower seat)    Recommendations for Other Services     Frequency Min 3X/week    Precautions / Restrictions Precautions Precautions: Fall Restrictions Weight Bearing Restrictions: No   Pertinent Vitals/Pain Pain limiting mobility      Mobility  Bed Mobility Overal bed mobility: Needs Assistance Bed Mobility: Supine to Sit;Sit to Supine Supine to sit: Min assist Sit to supine: Min assist General bed mobility comments: Verbal cues for  technique.  Assist to raise trunk to sitting position.  Once upright, patient with good sitting balance.  Required assist to bring LE's onto bed to return to supine. Transfers Overall transfer level: Needs assistance Equipment used: Rolling walker (2 wheeled) Transfers: Sit to/from Stand Sit to Stand: Min assist General transfer comment: Verbal cues for hand placement.  Assist to rise to standing and for balance initially. Ambulation/Gait Ambulation/Gait assistance: Min assist Ambulation Distance (Feet): 40 Feet Assistive device: Rolling walker (2 wheeled) Gait Pattern/deviations: Step-through pattern;Decreased step length - right;Decreased step length - left;Decreased stride length;Shuffle;Trunk flexed Gait velocity: Slow Gait velocity interpretation: Below normal speed for age/gender General Gait Details: Verbal cues for safe use of RW.  Patient with flexed posture and shuffling gait pattern.  Balance improved with use of RW.    Exercises General Exercises - Lower Extremity Ankle Circles/Pumps: AROM;Both;10 reps;Supine   PT Diagnosis: Difficulty walking;Abnormality of gait;Generalized weakness;Acute pain  PT Problem List: Decreased strength;Decreased activity tolerance;Decreased balance;Decreased mobility;Decreased knowledge of use of DME;Pain PT Treatment Interventions: DME instruction;Gait training;Stair training;Functional mobility training;Balance training;Patient/family education     PT Goals(Current goals can be found in the care plan section) Acute Rehab PT Goals Patient Stated Goal: To get strong enough to go home PT Goal Formulation: With patient/family Time For Goal Achievement: 12/31/13 Potential to Achieve Goals: Good  Visit Information  Last PT Received On: 12/24/13 Assistance Needed: +1 History of Present Illness: Pt is a 68 yo F who presents with history of splenic infarct and abdominal pain.  She had worsening of the pain which seemed to be left chest  pain.  She  went to the ED and got a chest CT which was negative for PE.  She continued to have horrible pain, and an abd ct was ordered the next day by her PCP.  This demonstrated a cystic mass in the tail of the pancreas and pancreatitis.  She subsequently underwent an endoscopic ultrasound and was found to a mass in the center of the pancreas.  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  Patient is now s/p Diagnostic laparoscopy, laparoscopic cholecystectomy, open extended distal pancreatectomy, splenectomy, partial gastrectomy x2, intraoperative ultrasound of pancreas.       Prior [place] expects to be discharged to:: Private residence Living Arrangements: Spouse/significant other Available Help at Discharge: Family;Friend(s);Available 24 hours/day Type of Home: House Home Access: Stairs to enter [employer] of Steps: 3 Entrance Stairs-Rails: Right;Left Home Layout: Two level;Able to live on main level with bedroom/bathroom Home Equipment: Gilford Rile - 2 wheels Prior Function Level of Independence: Independent Comments: Patient is a very active person. Communication Communication: No difficulties    Cognition  Cognition Arousal/Alertness: Awake/alert Behavior During Therapy: WFL for tasks assessed/performed Overall Cognitive Status: Within Functional Limits for tasks assessed    Extremity/Trunk Assessment Upper Extremity Assessment Upper Extremity Assessment: Generalized weakness Lower Extremity Assessment Lower Extremity Assessment: Generalized weakness   Balance    End of Session PT - End of Session Activity Tolerance: Patient limited by pain;Patient limited by fatigue Patient left: in bed;with call bell/phone within reach;with family/visitor present Nurse Communication: Mobility status  GP     Despina Pole 12/24/2013, 6:48 PM Carita Pian. Sanjuana Kava, [place] Pager [phone]

## 2013-12-24 NOTE — Progress Notes (Signed)
4 Days Post-Op  Subjective: No n/v, some flatus, ambulating some, breathing ok  Objective: Vital signs in last 24 hours: Temp:  [97.2 F (36.2 C)-98.3 F (36.8 C)] 98 F (36.7 C) (02/22 1110) Pulse Rate:  [53-70] 54 (02/22 1110) Resp:  [18-19] 18 (02/22 1110) BP: (139-173)/(57-72) 173/65 mmHg (02/22 1110) SpO2:  [91 %-96 %] 96 % (02/22 1110) Last BM Date: 12/18/13  Intake/Output from previous day: 02/21 0701 - 02/22 0700 In: 1013.3 [P.O.:210; I.V.:803.3] Out: 2660 [Urine:2625; Drains:35] Intake/Output this shift:    General appearance: no distress Resp: clear to auscultation bilaterally Cardio: regular rate and rhythm GI: mild distention, bs present decreased, drain serosang approp tender, wound clean  Lab Results:   Recent Labs  12/23/13 0422 12/24/13 0835  WBC 19.5* 15.1*  HGB 7.9* 8.5*  HCT 23.9* 25.2*  PLT 223 342   BMET  Recent Labs  12/23/13 0422 12/24/13 0835  NA 138 141  K 4.3 4.0  CL 102 101  CO2 26 29  GLUCOSE 144* 124*  BUN 4* 6  CREATININE 0.52 0.50  CALCIUM 8.4 8.4    Studies/Results: Dg Chest Port 1 View  12/23/2013   CLINICAL DATA:  Worsening hypoxia. Breast cancer. Pancreatic carcinoma.  EXAM: PORTABLE CHEST - 1 VIEW  COMPARISON:  12/21/13  FINDINGS: Low lung volumes are demonstrated with increased atelectasis and probable small bilateral pleural effusions the lung bases. Cardiomegaly is stable. Left arm PICC line remains in appropriate position.  IMPRESSION: Increased bibasilar atelectasis and probable small bilateral pleural effusions. Stable cardiomegaly.   Electronically Signed   By: Earle Gell M.D.   On: 12/23/2013 18:54    Anti-infectives: Anti-infectives   Start     Dose/Rate Route Frequency Ordered Stop   12/20/13 2000  cefOXitin (MEFOXIN) 1 g in dextrose 5 % 50 mL IVPB     1 g 100 mL/hr over 30 Minutes Intravenous Every 6 hours 12/20/13 1900 12/21/13 0830   12/20/13 1215  cefOXitin (MEFOXIN) 2 g in dextrose 5 % 50 mL IVPB      2 g 100 mL/hr over 30 Minutes Intravenous To Surgery 12/20/13 1202 12/20/13 1221      Assessment/Plan: POD 4 distal panc/spleen  1. Cont iv pain meds 2. Await ileus to resolve, she is taking some clears, monitor drain output 3. Negative 1.6 l yesterday will give more lasix today, check bmet in am, aggressive pulm toilet, oob 4. Continue foley due to diuresis, should be able to come out tomorrow, has failed once 5. lovenox scds   [facility] 12/24/2013

## 2013-12-25 ENCOUNTER — Ambulatory Visit: Payer: Medicare HMO | Admitting: Oncology

## 2013-12-25 LAB — BASIC METABOLIC PANEL
BUN: 7 mg/dL (ref 6–23)
CO2: 33 meq/L — AB (ref 19–32)
Calcium: 8.5 mg/dL (ref 8.4–10.5)
Chloride: 99 mEq/L (ref 96–112)
Creatinine, Ser: 0.5 mg/dL (ref 0.50–1.10)
GFR calc Af Amer: >90 mL/min (ref >90)
GLUCOSE: 107 mg/dL — AB (ref 70–99)
POTASSIUM: 3.2 meq/L — AB (ref 3.7–5.3)
Sodium: 141 mEq/L (ref 137–147)

## 2013-12-25 LAB — CBC
HEMATOCRIT: 26 % — AB (ref 36.0–46.0)
HEMOGLOBIN: 8.8 g/dL — AB (ref 12.0–15.0)
MCH: 28.5 pg (ref 26.0–34.0)
MCHC: 33.8 g/dL (ref 30.0–36.0)
MCV: 84.1 fL (ref 78.0–100.0)
Platelets: 436 10*3/uL — ABNORMAL HIGH (ref 150–400)
RBC: 3.09 MIL/uL — ABNORMAL LOW (ref 3.87–5.11)
RDW: 15.7 % — ABNORMAL HIGH (ref 11.5–15.5)
WBC: 12.7 10*3/uL — ABNORMAL HIGH (ref 4.0–10.5)

## 2013-12-25 LAB — GLUCOSE, CAPILLARY
GLUCOSE-CAPILLARY: 105 mg/dL — AB (ref 70–99)
Glucose-Capillary: 119 mg/dL — ABNORMAL HIGH (ref 70–99)
Glucose-Capillary: 116 mg/dL — ABNORMAL HIGH (ref 70–99)
Glucose-Capillary: 120 mg/dL — ABNORMAL HIGH (ref 70–99)
Glucose-Capillary: 114 mg/dL — ABNORMAL HIGH (ref 70–99)
Glucose-Capillary: 106 mg/dL — ABNORMAL HIGH (ref 70–99)

## 2013-12-25 MED ORDER — NAPROXEN 250 MG PO TABS
250.0000 mg | ORAL_TABLET | Freq: Two times a day (BID) | ORAL | Status: DC | PRN
  Administered 2013-12-25: 250 mg via ORAL
  Filled 2013-12-25: qty 1

## 2013-12-25 MED ORDER — PANCRELIPASE (LIP-PROT-AMYL) 12000-38000 UNITS PO CPEP
2.0000 | ORAL_CAPSULE | Freq: Three times a day (TID) | ORAL | Status: DC
  Administered 2013-12-25 – 2013-12-27 (×5): 2 via ORAL
  Filled 2013-12-25 (×11): qty 2

## 2013-12-25 MED ORDER — NAPROXEN SODIUM 220 MG PO TABS: 220.0000 mg | ORAL_TABLET | Freq: Two times a day (BID) | ORAL | Status: DC | PRN

## 2013-12-25 MED ORDER — POTASSIUM CHLORIDE CRYS ER 20 MEQ PO TBCR
40.0000 meq | EXTENDED_RELEASE_TABLET | Freq: Every day | ORAL | Status: DC
  Administered 2013-12-25 – 2013-12-26 (×2): 40 meq via ORAL
  Filled 2013-12-25 (×3): qty 2

## 2013-12-25 MED ORDER — FUROSEMIDE 20 MG PO TABS
20.0000 mg | ORAL_TABLET | Freq: Every day | ORAL | Status: DC
  Administered 2013-12-25 – 2013-12-27 (×3): 20 mg via ORAL
  Filled 2013-12-25 (×3): qty 1

## 2013-12-25 MED ORDER — LORATADINE 10 MG PO TABS
10.0000 mg | ORAL_TABLET | Freq: Every day | ORAL | Status: DC
  Administered 2013-12-25 – 2013-12-27 (×3): 10 mg via ORAL
  Filled 2013-12-25 (×3): qty 1

## 2013-12-25 MED ORDER — PANTOPRAZOLE SODIUM 40 MG PO TBEC
40.0000 mg | DELAYED_RELEASE_TABLET | Freq: Every day | ORAL | Status: DC
  Administered 2013-12-25 – 2013-12-26 (×2): 40 mg via ORAL
  Filled 2013-12-25 (×2): qty 1

## 2013-12-25 NOTE — Progress Notes (Signed)
Patient ID: Annette Beltran, female   DOB: [birth year], 68 y.o.   MRN: [id number] 5 Days Post-Op  Subjective: Had bm yesterday.  No n/v.  Hates clear liquids.  sats improved with lasix.    Objective: Vital signs in last 24 hours: Temp:  [98 F (36.7 C)-98.4 F (36.9 C)] 98 F (36.7 C) (02/23 0606) Pulse Rate:  [49-68] 56 (02/23 0606) Resp:  [16-18] 18 (02/23 0606) BP: (149-173)/(58-70) 165/70 mmHg (02/23 0606) SpO2:  [81 %-97 %] 97 % (02/23 0606) Last BM Date: 12/18/13  Intake/Output from previous day: 02/22 0701 - 02/23 0700 In: 1510 [P.O.:360; I.V.:1150] Out: 3575 [Urine:3525; Drains:50] Intake/Output this shift:    General appearance: no distress Resp: clear to auscultation bilaterally Cardio: regular rate and rhythm GI: mild distention, bs present decreased, drain serosang approp tender, wound clean  Lab Results:   Recent Labs  12/24/13 0835 12/25/13 0430  WBC 15.1* 12.7*  HGB 8.5* 8.8*  HCT 25.2* 26.0*  PLT 342 436*   BMET  Recent Labs  12/24/13 0835 12/25/13 0430  NA 141 141  K 4.0 3.2*  CL 101 99  CO2 29 33*  GLUCOSE 124* 107*  BUN 6 7  CREATININE 0.50 0.50  CALCIUM 8.4 8.5    Studies/Results: Dg Chest Port 1 View  12/23/2013   CLINICAL DATA:  Worsening hypoxia. Breast cancer. Pancreatic carcinoma.  EXAM: PORTABLE CHEST - 1 VIEW  COMPARISON:  12/21/13  FINDINGS: Low lung volumes are demonstrated with increased atelectasis and probable small bilateral pleural effusions the lung bases. Cardiomegaly is stable. Left arm PICC line remains in appropriate position.  IMPRESSION: Increased bibasilar atelectasis and probable small bilateral pleural effusions. Stable cardiomegaly.   Electronically Signed   By: Earle Gell M.D.   On: 12/23/2013 18:54    Anti-infectives: Anti-infectives   Start     Dose/Rate Route Frequency Ordered Stop   12/20/13 2000  cefOXitin (MEFOXIN) 1 g in dextrose 5 % 50 mL IVPB     1 g 100 mL/hr over 30 Minutes Intravenous Every 6 hours  12/20/13 1900 12/21/13 0830   12/20/13 1215  cefOXitin (MEFOXIN) 2 g in dextrose 5 % 50 mL IVPB     2 g 100 mL/hr over 30 Minutes Intravenous To Surgery 12/20/13 1202 12/20/13 1221      Assessment/Plan: POD 5 distal panc/spleen  1. Oral pain meds 2.  Advance diet. 3. Restart home lasix 4. D/c foley Lovenox/scds for vte prophylaxis Saline lock IVF Replete hypokalemia    Helga Asbury 12/25/2013

## 2013-12-25 NOTE — Care Management Note (Signed)
    Page 1 of 2   12/26/2013     7:51:38 AM   CARE MANAGEMENT NOTE 12/26/2013  Patient:  Annette Beltran, Annette Beltran   Account Number:  [id number]  Date Initiated:  12/25/2013  Documentation initiated by:  Magdalen Spatz  Subjective/Objective Assessment:     Action/Plan:   Anticipated DC Date:  12/27/2013   Anticipated DC Plan:  [place]         Choice offered to / List presented to:  C-1 Patient   DME arranged  HOSPITAL BED      DME agency  [place] arranged  [place].   Status of service:  Completed, signed off Medicare Important Message given?   (If response is "NO", the following Medicare IM given date fields will be blank) Date Medicare IM given:   Date Additional Medicare IM given:    Discharge Disposition:    Per UR Regulation:    If discussed at Long Length of Stay Meetings, dates discussed:    Comments:  12-26-13 Consult for Medical recliner. Patient  or family will have to go to a medical equipment company to chose which one she wants. Magdalen Spatz RN BSN [phone]   12-25-13 Patient has talked to a friend , who has a 3 n 1 and walker ,  patient can borrow . Magdalen Spatz RN BSN [phone]   12-25-13 Confirmed patient's face sheet information . Provided list of home health agencies . Patient's husband returning to visit this afternoon . Patient and husband will discuss home health at that time. Will follow up . Magdalen Spatz RN BSN [phone]

## 2013-12-26 ENCOUNTER — Telehealth: Payer: Self-pay | Admitting: *Deleted

## 2013-12-26 ENCOUNTER — Other Ambulatory Visit: Payer: Self-pay | Admitting: Oncology

## 2013-12-26 LAB — BASIC METABOLIC PANEL
BUN: 9 mg/dL (ref 6–23)
CALCIUM: 8.7 mg/dL (ref 8.4–10.5)
CO2: 32 meq/L (ref 19–32)
CREATININE: 0.57 mg/dL (ref 0.50–1.10)
Chloride: 101 mEq/L (ref 96–112)
GFR calc Af Amer: >90 mL/min (ref >90)
GLUCOSE: 92 mg/dL (ref 70–99)
Potassium: 3.7 mEq/L (ref 3.7–5.3)
Sodium: 143 mEq/L (ref 137–147)

## 2013-12-26 LAB — CBC
HEMATOCRIT: 26.4 % — AB (ref 36.0–46.0)
HEMOGLOBIN: 9.3 g/dL — AB (ref 12.0–15.0)
MCH: 29.6 pg (ref 26.0–34.0)
MCHC: 35.2 g/dL (ref 30.0–36.0)
MCV: 84.1 fL (ref 78.0–100.0)
Platelets: 519 10*3/uL — ABNORMAL HIGH (ref 150–400)
RBC: 3.14 MIL/uL — AB (ref 3.87–5.11)
RDW: 15.9 % — ABNORMAL HIGH (ref 11.5–15.5)
WBC: 10.7 10*3/uL — ABNORMAL HIGH (ref 4.0–10.5)

## 2013-12-26 LAB — GLUCOSE, CAPILLARY
Glucose-Capillary: 89 mg/dL (ref 70–99)
Glucose-Capillary: 137 mg/dL — ABNORMAL HIGH (ref 70–99)
Glucose-Capillary: 132 mg/dL — ABNORMAL HIGH (ref 70–99)
Glucose-Capillary: 92 mg/dL (ref 70–99)

## 2013-12-26 MED ORDER — INSULIN ASPART 100 UNIT/ML ~~LOC~~ SOLN
0.0000 [IU] | Freq: Three times a day (TID) | SUBCUTANEOUS | Status: DC
  Administered 2013-12-26 (×2): 1 [IU] via SUBCUTANEOUS

## 2013-12-26 MED ORDER — OXYCODONE-ACETAMINOPHEN 5-325 MG PO TABS: 1.0000 | ORAL_TABLET | ORAL | Status: DC | PRN

## 2013-12-26 MED ORDER — BOOST / RESOURCE BREEZE PO LIQD: 1.0000 | Freq: Two times a day (BID) | ORAL | Status: DC

## 2013-12-26 MED ORDER — PANCRELIPASE (LIP-PROT-AMYL) 12000-38000 UNITS PO CPEP: 2.0000 | ORAL_CAPSULE | Freq: Three times a day (TID) | ORAL | Status: DC

## 2013-12-26 NOTE — Progress Notes (Signed)
Changed pt.'s dressing from JP removal site d/t drainage.  Covered with gauze.  Pt. Tolerated well.  Will continue to monitor. Syliva Overman

## 2013-12-26 NOTE — Progress Notes (Signed)
NUTRITION FOLLOW UP  Intervention:   - Continue to advance diet as medically appropriate - [employer] po BID, each supplement provides 250 kcal and 9 grams of protein  Nutrition Dx:   Increased nutrient needs related to chronic illness as evidenced by estimated nutrition needs; ongoing  Goal:   Pt to meet >/= 90% of their estimated nutrition needs; not met  Monitor:   Toleration of diet, I/O's, weight trends, acceptance of supplements  Assessment:   68 yo F who presents with history of splenic infarct and abdominal pain. She had worsening of the pain which seemed to be left chest pain. She went to the ED and got a chest CT which was negative for PE. She continued to have horrible pain, and an abd ct was ordered the next day by her PCP. This demonstrated a cystic mass in the tail of the pancreas and pancreatitis. She subsequently underwent an endoscopic ultrasound and was found to a mass in the center of the pancreas. 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  Patient presented for surgery on 2/18.  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 Firm mass in body of pancreas. No evidence of metastatic disease. Chronic cholecystitis. Final pancreatic margin on top of SMV  Pt says that her appetite is still poor. She has been eating small amounts, and her po intake has been improving. She agreed to drink [employer] to supplement calories and protein to her diet.  Height: Ht Readings from Last 1 Encounters:  12/20/13 5' 5"  (1.651 m)    Weight Status:   Wt Readings from Last 1 Encounters:  12/22/13 167 lb 1.7 oz (75.8 kg)    Re-estimated needs:  Kcal: 1900-2100 Protein: 90-100 g Fluid: 1.9-2.1 L/day  Skin: incision on abdomen  Diet Order: Criss Rosales   Intake/Output Summary (Last 24 hours) at  12/26/13 1113 Last data filed at 12/26/13 0646  Gross per 24 hour  Intake      0 ml  Output     65 ml  Net    -65 ml    Last BM: 2/24   Labs:   Recent Labs Lab 12/20/13 1636  12/20/13 1706 12/21/13 0450  12/24/13 0835 12/25/13 0430 12/26/13 0620  NA 140  --  141 139  < > 141 141 143  K 3.5*  --  3.9 4.9  < > 4.0 3.2* 3.7  CL  --   < > 105 104  < > 101 99 101  CO2  --   < > 24 25  < > 29 33* 32  BUN  --   < > 12 10  < > 6 7 9   CREATININE  --   < > 0.50 0.57  < > 0.50 0.50 0.57  CALCIUM  --   < > 7.8* 7.6*  < > 8.4 8.5 8.7  MG  --   --  1.4* 1.6  --   --   --   --   PHOS  --   --   --  4.2  --   --   --   --   GLUCOSE  --   < > 193* 205*  < > 124* 107* 92  < > = values in this interval not displayed.  CBG (last 3)   Recent Labs  12/25/13 1925 12/25/13 2209 12/26/13 0751  GLUCAP 114* 106*  89    Scheduled Meds: . chlorhexidine  15 mL Mouth/Throat BID  . enoxaparin (LOVENOX) injection  40 mg Subcutaneous Q24H  . furosemide  20 mg Oral Daily  . insulin aspart  0-9 Units Subcutaneous 6 times per day  . insulin aspart  0-9 Units Subcutaneous TID WC  . lipase/protease/amylase  2 capsule Oral TID WC  . loratadine  10 mg Oral Daily  . pantoprazole  40 mg Oral Q1200  . PARoxetine  10 mg Oral Daily  . potassium chloride  40 mEq Oral Daily  . sodium chloride  1,000 mL Intravenous Once  . sodium chloride  10-40 mL Intracatheter Q12H    Continuous Infusions:   Terrace Arabia RD, LDN

## 2013-12-26 NOTE — Progress Notes (Unsigned)
[place]  [phone] Fax:(336) [phone]     ID: Annette Beltran OB: January 09, 1946  MR#: [id number]  PJK#:[id number]  PCP: Annette Showers, MD GYN:   SU: Annette Beltran OTHER MD: Annette Beltran, Annette Beltran  CHIEF COMPLAINT: "I haven't felt well since September"  HISTORY OF PRESENT ILLNESS: I formerly followed Annette Beltran for a stage I breast cancer, which was treated with surgery, radiation, and anti-estrogens. She was released from followup here in 2007. In addition, in 2012 she had abdominal pain leading to hospitalization. 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 The patient also has a history of collagenous colitis, which of course carries its own set of symptoms, making identification of her new attic problem more difficult.  Annette Beltran tells me she started "feeling bad" in September of 2014. There was abdominal discomfort localizing to the left upper o'clock on, worse with inspiration. CT angiography 08/21/2013 showed no clot and no significant abnormalities. Plain rib and left shoulder views were negative. CT abdomen and pelvis with contrast 08/22/2013 showed slight prominence of the pancreatic duct in the distal body and tail with a new small structure emanating from the tail of the pancreas consistent with a pseudocyst. Exudate near the tail of the pancreas suggested pancreatitis. MRI of the abdomen 09/03/2013 confirmed several ill-defined fluid collections between the gastric fundus and the splenic hilum surrounding the pancreatic tail. There was no evidence of a pancreatic mass. The pancreatic tail did enhanced following contrast. The splenic vein appeared chronically thrombosed with a small amount of nonocclusive thrombus extending into the main and left portal  veins.  On 09/26/2013 amylase was 237 and lipase 513.0, consistent with acute pancreatitis. These numbers subsequently dropped in on 11/28/2013 amylase was normal at 47 and lipase was mildly elevated at 110  With continuing left-sided abdominal discomfort, abdominal ultrasound 11/13/2013 showed multiple gallstones with no evidence of cholecystitis. Evaluation of the pancreas showed a hypoechoic lobulated focus measuring up to 7.4 cm felt to be nonspecific. Repeat abdominal CT 11/14/2013 showed diffuse pancreatic atrophy without evidence of a mass, again consistent with chronic pancreatitis. There was mild soft tissue stranding suspicious for mild superimposed acute pancreatitis. A new rim-enhancing fluid collection was noted in the gastrosplenic ligament measuring 4.5 cm, consistent with a pseudocyst. Finally on 11/23/2013, endoscopic ultrasonography under Annette Beltran showed a subtle 2.5 cm soft tissue mass in the mid-pancreas, abutting the splenic vein. This was biopsied x2, with cytology (NZB [id number]) showing adenocarcinoma. A peripancreatic fluid collection measuring over 5 cm was aspirated yielding 25 cc of chocolate colored fluid. Two or three scattered round hypoechoic lymph nodes were noted near the pancreatic tail but were not sampled.  In addition, on 11/28/2013 a chest x-ray obtained to evaluate upper respiratory symptoms showed a small to moderate left effusion. Chest CT scan 11/29/2013 confirmed a moderate left-sided pleural effusion with no suspicious appearing pulmonary nodules or masses associated with it. The complex cystic lesion associated with the tail of the pancreas was again noted, measuring 5.5 cm despite the recent aspiration procedure.  The patient's subsequent history is as detailed below.  INTERVAL HISTORY: Annette Beltran was evaluated in our clinic on 11/30/2013 accompanied by her husband Annette Beltran and her daughters Annette Beltran and Annette Beltran.  REVIEW OF SYSTEMS: Annette Beltran continues to complain of mild  temperatures, left shoulder and left upper  quadrant discomfort, which is intermittent, but can be severe. She describes herself is fatigued. She tells me she has lost approximately 20 pounds in the last few months. She has a little bit of a runny nose, sore throat, and a dry cough. She has some shortness of breath with vigorous activity. A detailed review of systems today was otherwise noncontributory  PAST MEDICAL HISTORY: Past Medical History  Diagnosis Date  . Macular degeneration   . Fluid retention   . Colitis, collagenous   . Vitamin D deficiency   . Osteopenia   . Celiac artery stenosis   . Splenic infarction   . Arthritis   . History of blood clots   . Anxiety   . Shingles   . Pancreatitis   . Osteoporosis   . Clotting disorder   . Breast cancer     rt lumpectomy  . Arrhythmia     "skips a beat"  [place]  heart    PAST SURGICAL HISTORY: Past Surgical History  Procedure Laterality Date  . Foot surgery  2003    x 3, 2 on right, 1 on left  . Breast lumpectomy Right     radiation for 6 weeks  . Meniscus repair Right   . Eus N/A 11/23/2013    Procedure: UPPER ENDOSCOPIC ULTRASOUND (EUS) LINEAR;  Surgeon: Annette Banister, MD;  Location: [facility];  Service: Endoscopy;  Laterality: N/A;  . Tonsillectomy    . Laparoscopy N/A 12/20/2013    Procedure: LAPAROSCOPY DIAGNOSTIC ;  Surgeon: Annette Klein, MD;  Location: [place];  Service: General;  Laterality: N/A;  . Cholecystectomy N/A 12/20/2013    Procedure: LAPAROSCOPIC CHOLECYSTECTOMY;  Surgeon: Annette Klein, MD;  Location: [place];  Service: General;  Laterality: N/A;  . Splenectomy, total N/A 12/20/2013    Procedure: SPLENECTOMY;  Surgeon: Annette Klein, MD;  Location: [place];  Service: General;  Laterality: N/A;  . Partial gastrectomy N/A 12/20/2013    Procedure: PARTIAL GASTRECTOMY;  Surgeon: Annette Klein, MD;  Location: [facility];  Service: General;  Laterality: N/A;    FAMILY HISTORY Family History  Problem Relation Age of  Onset  . Colon cancer Neg Hx   . Aneurysm Mother   . Stroke Mother   . Prostate cancer Father   . Skin cancer Brother   . Heart failure Paternal Aunt   . Cancer Paternal Aunt     breast  . Pancreatic cancer Maternal Grandmother   . Cancer Maternal Aunt     breast   the patient's father had a diagnosis of prostate cancer metastatic to the liver or a died at age 27. The patient's mother died secondary to carotid third brain aneurysm at the age of 8. The patient had 2 brothers, no sisters. One brother has a history of melanoma. The patient's mother is mother was diagnosed with pancreatic cancer in her 30s. There is a history of breast cancer and second degree relatives, none before the age of 19  GYNECOLOGIC HISTORY:  Menarche age 83, first live birth age 73, the patient is [place] P2.. Stopped in her early 59s. 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 Her husband of 58 years, Annette Beltran, retired from [employer] and currently works part-time as a Cabin crew with [employer]. Daughter Annette Beltran teaches first grade. Daughter Leafy Ro is a homemaker. The patient has 4 grandchildren Annette Beltran has an additional 5 grandchildren of his own).  The patient attends a [employer]    ADVANCED DIRECTIVES: In place   HEALTH MAINTENANCE: History  Substance Use Topics  . Smoking status: Former Smoker -- 0.50 packs/day for 18 years    Types: Cigarettes    Quit date: 11/02/2000  . Smokeless tobacco: Never Used  . Alcohol Use: Yes     Comment: 1-2 alcohol drinks/day- NONE AS OF 11-20-2013     Colonoscopy:  PAP:  Bone density:  Lipid panel:  Mammography:  Allergies  Allergen Reactions  . Codeine Nausea And Vomiting  . Lactose Intolerance (Gi)   . Tequin     Severe stomach pain  . Penicillins Nausea Only and Rash    No current facility-administered medications for this visit.   Current Outpatient Prescriptions   Medication Sig Dispense Refill  . lipase/protease/amylase (CREON-12/PANCREASE) 12000 UNITS CPEP capsule Take 2 capsules by mouth 3 (three) times daily with meals.  270 capsule  3  . oxyCODONE-acetaminophen (PERCOCET/ROXICET) 5-325 MG per tablet Take 1-2 tablets by mouth every 4 (four) hours as needed (for pain).  40 tablet  0   Facility-Administered Medications Ordered in Other Visits  Medication Dose Route Frequency Provider Last Rate Last Dose  . acetaminophen (TYLENOL) tablet 650 mg  650 mg Oral Q6H PRN Rolm Bookbinder, MD   650 mg at 12/23/13 1705  . ALPRAZolam Duanne Moron) tablet 0.5 mg  0.5 mg Oral BID PRN Thomas A. Cornett, MD   0.5 mg at 12/25/13 2200  . chlorhexidine (PERIDEX) 0.12 % solution 15 mL  15 mL Mouth/Throat BID Annette Klein, MD   15 mL at 12/25/13 0946  . enoxaparin (LOVENOX) injection 40 mg  40 mg Subcutaneous Q24H Thomas A. Cornett, MD   40 mg at 12/25/13 1401  . feeding supplement (RESOURCE BREEZE) (RESOURCE BREEZE) liquid 1 Container  1 Container Oral BID BM Dagmar Hait, RD      . furosemide (LASIX) tablet 20 mg  20 mg Oral Daily Annette Klein, MD   20 mg at 12/26/13 1014  . HYDROmorphone (DILAUDID) injection 1 mg  1 mg Intravenous Q2H PRN Thomas A. Cornett, MD   1 mg at 12/24/13 2354  . insulin aspart (novoLOG) injection 0-9 Units  0-9 Units Subcutaneous 6 times per day Annette Klein, MD   1 Units at 12/24/13 2030  . insulin aspart (novoLOG) injection 0-9 Units  0-9 Units Subcutaneous TID WC Annette Klein, MD      . lipase/protease/amylase (CREON-12/PANCREASE) capsule 2 capsule  2 capsule Oral TID WC Annette Klein, MD   2 capsule at 12/26/13 0837  . loratadine (CLARITIN) tablet 10 mg  10 mg Oral Daily Annette Klein, MD   10 mg at 12/26/13 1014  . LORazepam (ATIVAN) bolus via infusion 0.5-1 mg  0.5-1 mg Intravenous Q8H PRN Annette Klein, MD      . naproxen (NAPROSYN) tablet 250 mg  250 mg Oral BID PRN Annette Klein, MD   250 mg at 12/25/13 1700  . ondansetron (ZOFRAN) tablet 4  mg  4 mg Oral Q6H PRN Annette Klein, MD       Or  . ondansetron (ZOFRAN) injection 4 mg  4 mg Intravenous Q6H PRN Annette Klein, MD      . oxyCODONE-acetaminophen (PERCOCET/ROXICET) 5-325 MG per tablet 1-2 tablet  1-2 tablet Oral Q6H PRN Gayland Curry, MD   2 tablet at 12/26/13 1023  . pantoprazole (PROTONIX) EC tablet 40 mg  40 mg Oral Q1200 Annette Klein, MD   40 mg  at 12/25/13 1518  . PARoxetine (PAXIL) tablet 10 mg  10 mg Oral Daily Annette Klein, MD   10 mg at 12/26/13 1014  . potassium chloride SA (K-DUR,KLOR-CON) CR tablet 40 mEq  40 mEq Oral Daily Annette Klein, MD   40 mEq at 12/26/13 1013  . sodium chloride 0.9 % bolus 1,000 mL  1,000 mL Intravenous Once Annette Klein, MD      . sodium chloride 0.9 % injection 10-40 mL  10-40 mL Intracatheter Q12H Annette Klein, MD   10 mL at 12/24/13 2221  . sodium chloride 0.9 % injection 10-40 mL  10-40 mL Intracatheter PRN Annette Klein, MD   10 mL at 12/25/13 0430    OBJECTIVE: Middle-aged white woman who appears well There were no vitals filed for this visit.   There is no weight on file to calculate BMI.    ECOG FS:1 - Symptomatic but completely ambulatory  Ocular: Sclerae unicteric, pupils equal, round and reactive to light Ear-nose-throat: Oropharynx clear, dentition in good repair Lymphatic: No cervical or supraclavicular adenopathy Lungs no rales or rhonchi, minimal dullness at the left base  Heart regular rate and rhythm, no murmur appreciated Abd soft, nontender to moderate palpation including the left upper quadrant and epigastrium, positive bowel sounds MSK no focal spinal tenderness, no joint edema Neuro: non-focal, well-oriented, appropriate affect Breasts: Deferred   LAB RESULTS:  Results for EMMALENA, CANNY (MRN [id number]) as of 12/02/2013 10:16  Ref. Range 12/01/2013 11:50  Albumin, Fluid No range found 2.3  Fluid Type-FALB No range found PLEURAL  Fluid Type-FLDH No range found PLEURAL  LD, Fluid Latest Range: 3-23 U/L 128 (H)   Amylase, Pleural Fluid No range found 80    CMP     Component Value Date/Time   NA 143 12/26/2013 0620   K 3.7 12/26/2013 0620   CL 101 12/26/2013 0620   CO2 32 12/26/2013 0620   GLUCOSE 92 12/26/2013 0620   BUN 9 12/26/2013 0620   CREATININE 0.57 12/26/2013 0620   CREATININE 0.62 11/28/2013 1635   CALCIUM 8.7 12/26/2013 0620   PROT 5.0* 12/21/2013 0450   ALBUMIN 2.3* 12/21/2013 0450   AST 42* 12/21/2013 0450   ALT 25 12/21/2013 0450   ALKPHOS 46 12/21/2013 0450   BILITOT 0.7 12/21/2013 0450   GFRNONAA >90 12/26/2013 0620   GFRAA >90 12/26/2013 0620    I No results found for this basename: SPEP,  UPEP,   kappa and lambda light chains    Lab Results  Component Value Date   WBC 10.7* 12/26/2013   NEUTROABS 7.5 11/28/2013   HGB 9.3* 12/26/2013   HCT 26.4* 12/26/2013   MCV 84.1 12/26/2013   PLT 519* 12/26/2013      Chemistry      Component Value Date/Time   NA 143 12/26/2013 0620   K 3.7 12/26/2013 0620   CL 101 12/26/2013 0620   CO2 32 12/26/2013 0620   BUN 9 12/26/2013 0620   CREATININE 0.57 12/26/2013 0620   CREATININE 0.62 11/28/2013 1635      Component Value Date/Time   CALCIUM 8.7 12/26/2013 0620   ALKPHOS 46 12/21/2013 0450   AST 42* 12/21/2013 0450   ALT 25 12/21/2013 0450   BILITOT 0.7 12/21/2013 0450       Lab Results  Component Value Date   LABCA2 8 06/08/2011    No components found with this basename: LABCA125     Recent Labs Lab 12/21/13 0450  INR 1.24  Urinalysis    Component Value Date/Time   COLORURINE YELLOW 06/07/2011 1942   APPEARANCEUR CLEAR 06/07/2011 1942   LABSPEC 1.009 06/07/2011 1942   PHURINE 6.0 06/07/2011 1942   GLUCOSEU NEGATIVE 06/07/2011 1942   HGBUR NEGATIVE 06/07/2011 1942   BILIRUBINUR neg 12/25/2011 [place] 06/07/2011 1942   KETONESUR NEGATIVE 06/07/2011 1942   PROTEINUR NEGATIVE 06/07/2011 1942   UROBILINOGEN negative 12/25/2011 1216   UROBILINOGEN 0.2 06/07/2011 1942   NITRITE neg 12/25/2011 1216   NITRITE NEGATIVE 06/07/2011 1942    LEUKOCYTESUR Negative 12/25/2011 1216    STUDIES: Dg Chest 1 View  12/01/2013   CLINICAL DATA:  Left pleural effusion. Immediately status post left thoracentesis.  EXAM: CHEST - 1 VIEW  COMPARISON:  Chest x-ray dated 11/28/2013  FINDINGS: There has been almost complete elimination of the left pleural effusion. Minimal residual fluid and atelectasis at the left lung base. No pneumothorax. Right lung is clear. Heart size and vascularity are normal. No osseous abnormality.  IMPRESSION: No pneumothorax after thoracentesis. Minimal residual fluid and atelectasis at the left base.   Electronically Signed   By: Rozetta Nunnery M.D.   On: 12/01/2013 12:18   Dg Chest 2 View  11/28/2013   CLINICAL DATA:  Endoscopic exam 4 days ago, sore throat and fever since  EXAM: CHEST  2 VIEW  COMPARISON:  08/22/2013  FINDINGS: The heart size and vascular pattern are normal. The right lung is clear. On the left side, there is a small to moderate pleural effusion with underlying consolidation. This is not seen on the prior study.  IMPRESSION: Small to moderate left effusion with underlying consolidation. Pneumonitis is not excluded.   Electronically Signed   By: Skipper Cliche M.D.   On: 11/28/2013 17:07   Ct Chest W Contrast  11/29/2013   CLINICAL DATA:  Newly diagnosed pancreatic cancer. New left pleural effusion.  EXAM: CT CHEST WITH CONTRAST  TECHNIQUE: Multidetector CT imaging of the chest was performed during intravenous contrast administration.  CONTRAST:  66m OMNIPAQUE IOHEXOL 300 MG/ML  SOLN  COMPARISON:  CT of the abdomen and pelvis 11/14/2013.  FINDINGS: Mediastinum: Heart size is normal. There is no significant pericardial fluid, thickening or pericardial calcification. No pathologically enlarged mediastinal hilar lymph nodes. The esophagus is unremarkable in appearance.  Lungs/Pleura: New moderate left-sided pleural effusion layering dependently with associated passive atelectasis in the left lower lobe basal  segments and inferior segment of the lingula. No acute consolidative airspace disease. No definite suspicious appearing pulmonary nodules or masses.  Upper Abdomen: Complex cystic lesion emanating from the tail of the pancreas measuring approximately 5.3 x 4.0 x 5.5 cm, extending into the gastrosplenic ligament. Compared to the recent prior examination from 11/14/2013, there is increasing low-attenuation fluid associated with the splenic capsule, which may be reactive, or may be related to extension from the mass in the tail of the pancreas, as this does have a similar appearance to the primary lesion. This extends underneath the left hemidiaphragm. Mild fat stranding is noted adjacent to the tail of the pancreas and in the upper left retroperitoneum, potentially related to inflammation or lymphatic congestion. Geographic low attenuation throughout the right lobe of the liver as compared to the left is favored to represent regional fatty infiltration.  Musculoskeletal: There are no aggressive appearing lytic or blastic lesions noted in the visualized portions of the skeleton.  IMPRESSION: 1. New moderate left pleural effusion with extensive passive atelectasis in the left lung base, as above.  2. No definite signs of metastatic disease to the thorax at this time. 3. Slight interval enlargement of cystic mass in the tail of the pancreas, which now appears likely associated with some subcapsular spread along the posterior and superior aspect of the spleen. Alternatively, the subcapsular splenic fluid collections could be simply reactive fluid or pseudocysts. 4. Probable geographic fatty infiltration of the right lobe of the liver.   Electronically Signed   By: Vinnie Langton M.D.   On: 11/29/2013 14:23   US Abdomen Complete  11/13/2013   CLINICAL DATA:  Known pancreatic cyst now for follow-up. The patient is reporting left-sided abdominal pain. History of previous pancreatitis and splenic infarction.  EXAM:  ULTRASOUND ABDOMEN COMPLETE  COMPARISON:  MRI of the abdomen dated September 03, 2013.  FINDINGS: Gallbladder:  The gallbladder is adequately distended. There are multiple small mobile stones with distal shadowing. There is no gallbladder wall thickening or pericholecystic fluid. There is no positive sonographic Murphy's sign. Next item next  Common bile duct:  Diameter: 2.5 mm  Liver:  No focal lesion identified. Within normal limits in parenchymal echogenicity.  IVC:  No abnormality visualized.  Pancreas:  There is no focal cystic or solid appearing pancreatic mass. There is no pancreatic ductal dilation.  Spleen:  The is spleen is not enlarged. In the splenic hilum there is a hypoechoic focus which is been previously demonstrated and may reflect a lesion in the adjacent pancreatic tail or be related to other structures in the splenic hilum.  Right Kidney:  Length: 10.7 cm. Echogenicity within normal limits. No mass or hydronephrosis visualized.  Left Kidney:  Length: 11.8 cm. Echogenicity within normal limits. No mass or hydronephrosis visualized.  Abdominal aorta:  No aneurysm visualized.  Other findings:  No ascites is demonstrated  IMPRESSION: 1. There are gallstones present without evidence of acute cholecystitis. 2. In the region of the pancreatic tail there is a hypoechoic lobulated appearing focus measuring up to 7.4 cm which is nonspecific. This is the area of abnormalities on the previous MRI. If the patient is having acute symptoms or has laboratory abnormalities, follow-up MRI or abdominal CT scan would be useful. 3. There is no acute abnormality of the liver or kidneys or abdominal aorta or inferior vena cava.   Electronically Signed   By: David  [place]   On: 11/13/2013 09:14   Ct Abdomen Pelvis W Contrast  11/14/2013   CLINICAL DATA:  Cystic pancreatic lesion seen on ultrasound. Pancreatitis. Cholelithiasis.  EXAM: CT ABDOMEN AND PELVIS WITH CONTRAST  TECHNIQUE: Multidetector CT imaging of the  abdomen and pelvis was performed using the standard protocol following bolus administration of intravenous contrast.  CONTRAST:  80 mL Omnipaque 300  COMPARISON:  MRI on 09/03/2013 and CT on 08/22/2013  FINDINGS: Diffuse pancreatic atrophy and mild pancreatic ductal dilatation are again seen, without evidence of pancreatic mass. This is consistent with chronic pancreatitis. There is mild soft tissue stranding surrounding the pancreatic body and tail, suspicious for mild superimposed acute pancreatitis. New rim enhancing fluid collection is seen in the gastrosplenic ligament which measures 3.7 x 4.5 cm, consistent with pancreatic pseudocysts.  No evidence of cholecystitis or biliary dilatation. No liver masses are identified. The spleen is normal in size and appearance. Adrenal glands and kidneys are also normal in appearance. No evidence of hydronephrosis.  No soft tissue masses identified within the abdomen or pelvis. Uterus and adnexal regions are unremarkable in appearance. No evidence of other inflammatory process or abnormal fluid  collections within the pelvis. No evidence of bowel wall thickening or obstruction.  IMPRESSION: Findings suspicious for mild acute on chronic pancreatitis.  New 4.5 cm rim enhancing fluid collection in gastrosplenic ligament, consistent with pancreatic pseudocyst.   Electronically Signed   By: Earle Gell M.D.   On: 11/14/2013 16:14   US Thoracentesis Asp Pleural Space W/img Guide  12/01/2013   CLINICAL DATA:  Carcinoma of the pancreas, left pleural effusion, shortness of breath, request for diagnostic and therapeutic thoracentesis.  EXAM: ULTRASOUND GUIDED left THORACENTESIS  COMPARISON:  None.  FINDINGS: A total of approximately 400 ml of serous fluid was removed. A fluid sample was sent for laboratory analysis.  IMPRESSION: Successful ultrasound guided left thoracentesis yielding 400 ml of pleural fluid.  Read By:  Tsosie Billing PA-C  PROCEDURE: An ultrasound guided  thoracentesis was thoroughly discussed with the patient and questions answered. The benefits, risks, alternatives and complications were also discussed. The patient understands and wishes to proceed with the procedure. Written consent was obtained.  Ultrasound was performed to localize and mark an adequate pocket of fluid in the left chest. The area was then prepped and draped in the normal sterile fashion. 1% Lidocaine was used for local anesthesia. Under ultrasound guidance a 19 gauge Yueh catheter was introduced. Thoracentesis was performed. The catheter was removed and a dressing applied.  Complications:  none.   Electronically Signed   By: Aletta Edouard M.D.   On: 12/01/2013 12:17    ASSESSMENT: 67 y.o. 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 noted on abdominal MRI November 2014  (3) status post endoscopic ultrasonography of the pancreas 11/23/2013 with cytology positive for adenocarcinoma, clinical stage T2 NX MX adenocarcinoma associated with a 5.5 cm pseudocyst (which was aspirated); baseline CA 19-9 was 47.0  (4) left pleural effusion status post thoracentesis 12/01/2013, cytology negative  (5) s/plaparoscopic cholecystectomy, open extended distal pancreatectomy, splenectomy, partial gastrectomy x2 12/20/2013 for a pT1 pN0, stage IA invasive ductal adenocarcinoma, grade  2, with positive resection margins  PLAN: We spent the better part of today's hour-long visit discussing the anatomy and physiology of the pancreas and the natural history of pancreatic carcinoma. Unfortunately, because the pancreas has no capsule, there tends to be early shedding into the peritoneum as well as hematogenous spread to the liver and spread to the regional lymphatics. Direct spread to the multiple structures immediately adjacent to the pancreas is also possible. Early spread would make her tumor unresectable, and in the absence of resection there is no possibility of cure.  What we actually see is a 2.5 cm mass in the body of the pancreas, associated with a cystic fluid collection measuring more than 5 cm, which was aspirated. The mass directly abuts the splenic vein and there were 2 or 3 round hypoechoic lymph nodes near the pancreatic tail fluid collection. None of these findings in themselves would make the  tumor unresectable.  There is also a new left pleural effusion. I have discussed this with Dr. Barry Dienes and have arranged for left thoracentesis tomorrow. We will send the fluid for cytology as well as standard studies.  If if with this information Dr. Barry Dienes feels the pancreatic cancer is potentially resectable, then surgery would be step 1. Once the patient recovers, we would discuss the final pathology and likely proceed to adjuvant chemotherapy. My suggestion would be 3 months of chemotherapy before adding radiation. This would give any metastases that may be occult in the liver an opportunity to manifest themselves, which would make radiation otiose. Ideally the patient would receive 6 months of chemotherapy with radiation along with the second-half of those treatments.  Immaculate has a very good understanding of the overall plan. She agrees with it. She will call with any problems that may develop before her next visit here which I have tentatively scheduled for  12/25/2013    Chauncey Cruel, MD   12/26/2013 11:22 AM

## 2013-12-26 NOTE — Progress Notes (Signed)
PT Cancellation Note  Patient Details Name: Annette Beltran MRN: [id number] DOB: [birth year]   Cancelled Treatment:    Reason Eval/Treat Not Completed: Fatigue/lethargy limiting ability to participate.  Pt states she has been walking around room with Nsg and does not feel up to amb at this time.  Will check back tomorrow.     Bandon Sherwin, Thornton Papas 12/26/2013, 1:51 PM

## 2013-12-26 NOTE — Progress Notes (Signed)
Patient ID: Annette Beltran, female   DOB: [birth year], 68 y.o.   MRN: [id number] 6 Days Post-Op  Subjective: Doing better, but poor Po intake.  Remains off oxygen.  Objective: Vital signs in last 24 hours: Temp:  [97.9 F (36.6 C)-98.4 F (36.9 C)] 97.9 F (36.6 C) (02/24 0629) Pulse Rate:  [57-67] 67 (02/24 0629) Resp:  [17-18] 17 (02/24 0629) BP: (125-153)/(66-68) 153/66 mmHg (02/24 0629) SpO2:  [91 %-98 %] 91 % (02/24 0629) Last BM Date: 12/25/13  Intake/Output from previous day: 02/23 0701 - 02/24 0700 In: -  Out: 665 [Urine:600; Drains:65] Intake/Output this shift: Total I/O In: -  Out: 5 [Drains:5]  General appearance: no distress Resp: clear to auscultation bilaterally Cardio: regular rate and rhythm GI: nondistended, bs present decreased, drain serosang approp tender, wound clean  Lab Results:   Recent Labs  12/24/13 0835 12/25/13 0430  WBC 15.1* 12.7*  HGB 8.5* 8.8*  HCT 25.2* 26.0*  PLT 342 436*   BMET  Recent Labs  12/24/13 0835 12/25/13 0430  NA 141 141  K 4.0 3.2*  CL 101 99  CO2 29 33*  GLUCOSE 124* 107*  BUN 6 7  CREATININE 0.50 0.50  CALCIUM 8.4 8.5    Studies/Results: No results found.  Anti-infectives: Anti-infectives   Start     Dose/Rate Route Frequency Ordered Stop   12/20/13 2000  cefOXitin (MEFOXIN) 1 g in dextrose 5 % 50 mL IVPB     1 g 100 mL/hr over 30 Minutes Intravenous Every 6 hours 12/20/13 1900 12/21/13 0830   12/20/13 1215  cefOXitin (MEFOXIN) 2 g in dextrose 5 % 50 mL IVPB     2 g 100 mL/hr over 30 Minutes Intravenous To Surgery 12/20/13 1202 12/20/13 1221      Assessment/Plan: POD 6 distal panc/spleen  Oral pain meds Advance diet. home lasix Lovenox/scds for vte prophylaxis  Recheck labs.   Diabetes education for FSG.      [facility] 12/26/2013

## 2013-12-26 NOTE — Telephone Encounter (Signed)
sw pt spouse informed him that the pt appt for 3/2 is cancel. gv appt for 01/10/14 @ 5 pm. Pt spouse is aware...td

## 2013-12-26 NOTE — Progress Notes (Signed)
Inpatient Diabetes Program Recommendations  AACE/ADA: New Consensus Statement on Inpatient Glycemic Control (2013)  Target Ranges:  Prepandial:   less than 140 mg/dL      Peak postprandial:   less than 180 mg/dL (1-2 hours)      Critically ill patients:  140 - 180 mg/dL     **Received referral for this patient.  Patient s/p partial pancreatectomy.  Per Dr. Barry Dienes, patient will be checking CBGs at home.  **CBGs so far here in hospital OK (see below results)  Results for Annette Beltran, Annette Beltran (MRN [id number]) as of 12/26/2013 11:55  Ref. Range 12/24/2013 23:49 12/25/2013 03:58 12/25/2013 08:02 12/25/2013 11:45 12/25/2013 16:42 12/25/2013 19:25  Glucose-Capillary Latest Range: 70-99 mg/dL 100 (H) 105 (H) 119 (H) 116 (H) 120 (H) 114 (H)    **Spoke with patient about checking CBGs at home.  Explained to patient that if she checks QD, she should vary the time she checks every day.  If patient checks bid, encouraged patient to check QAM before breakfast and then to vary the 2nd check of the day.  Instructed patient to keep a written log of her CBGs and to take her CBG log book with her to her next PCP appointment.  **Reminded patient to wash her hands with soap and water before checking or to use alcohol rub and let dry.  Encouraged patient to stick her fingers along the rim and to always rotate as much as possible.  Patient asked me if she could check her CBGs elsewhere.  Instructed patient that if she gets a CBG meter that allows alternate site testing (ie. Forearm) that she can do this as well, but only if the CBG meter states in the instructions that alternate site testing is allowed.  **Encouarged patient to call her PCP if she notes that her CBGs are consistently >150 mg/dl at home for several days.  **Have asked RNs to allow patient to practice fingerstick glucose checks before d/c.   Will follow. Wyn Quaker RN, MSN, CDE Diabetes Coordinator Inpatient Diabetes Program Team Pager:  [phone] (8a-10p)

## 2013-12-26 NOTE — Telephone Encounter (Signed)
Received message from Dr. Barry Dienes stating that the pt has a lab appt on Thurs. 2/26 and will not be able to make that appt and wants me to call and get her rescheduled.  Called and spoke with pt's husband [name] and confirmed 01/04/14 lab appt w/ him.  Mailed new calendar to pt.  Emailed Dr. Barry Dienes to make her aware.

## 2013-12-26 NOTE — Progress Notes (Signed)
Removed JP drain.  Pt. Tolerated well.  Covered with gauze.  Will continue to monitor. Syliva Overman

## 2013-12-27 LAB — GLUCOSE, CAPILLARY: Glucose-Capillary: 84 mg/dL (ref 70–99)

## 2013-12-27 MED ORDER — BOOST / RESOURCE BREEZE PO LIQD: 1.0000 | Freq: Two times a day (BID) | ORAL | Status: DC

## 2013-12-27 NOTE — Discharge Summary (Signed)
Physician Discharge Summary  Patient ID: Annette Beltran MRN: [id number] DOB/AGE: [birth year] 68 y.o.  Admit date: 12/20/2013 Discharge date: 12/27/2013  Admission Diagnoses: Patient Active Problem List   Diagnosis Date Noted  . Peripheral edema 12/04/2013      . Carcinoma of pancreas 11/30/2013    Priority: High- primary problem  . Malnutrition of moderate degree 12/21/2013  . Pancreatic cancer 12/20/2013  . Breast cancer, right breast, h/o 12/02/2013  . Atherosclerosis of aorta 12/02/2013  . Pancreatic mass 11/23/2013  . Pancreatitis, acute 10/24/2013  . Recurrent serous otitis media 04/01/2012  . Grief reaction 02/05/2012  . Osteopenia 01/03/2012  . Anxiety 07/30/2011  . 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 Collagenous colitis 06/21/2011    Class: History of  . RHINOSINUSITIS, CHRONIC 03/06/2010  . ALLERGIC RHINITIS 03/06/2010    Discharge Diagnoses:  same  Discharged Condition: stable  Hospital Course:  Pt underwent open extended distal pancreatectomy/splenectomy/partial gastrectomy x 2.  Pt was admitted to the icu due to bleeding, hypotension post operatively.  She received albumin and had improvement.  She had a bump in her white blood cell count, which improved.  She had reasonable urine output.  She had mild ileus that resolved.  She had some hyperglycemia which improved significantly once off IV Fluids.  She began diuresing with some assistance from lasix.  She had some hypoxia as well that improved with lasix.  She had reasonable pain control with dilaudid pca and was able to transition to oral percocet.  Her diet improved.  Her PCP was contacted in order to arrange follow up for blood sugars.  She was not sent home on any insulin, but was prescribed a glucose meter.  She was ambulatory and weaned of oxygen.  She was able to void spontaneously the second time her catheter was removed.  Drain and staples were removed prior to d/c.  She was discharged to home in stable condition.     Consults: None  Significant Diagnostic Studies: labs: see epic.  HCT prior to d/c 26.4, Cr 0.57, Gluc 92, WBCs 10.7   Treatments: surgery: see above.    Discharge Exam: Blood pressure 144/63, pulse 65, temperature 98.1 F (36.7 C), temperature source Oral, resp. rate 20, height 5\' 5"  (1.651 m), weight 167 lb 1.7 oz (75.8 kg), SpO2 99.00%. General appearance: alert, cooperative and no distress Resp: breathing comfortably GI: soft, non distended, no evidence of infection in incision, drain site draining.  Extremities: extremities normal, atraumatic, no cyanosis or edema  Disposition: 01-Home or Self Care  Discharge Orders   Future Appointments Provider Department Dept Phone   01/04/2014 2:30 PM [facility] [phone]   01/10/2014 5:00 PM Chauncey Cruel, MD [facility] [phone]   Future Orders Complete By Expires   Call MD for:  persistant nausea and vomiting  As directed    Call MD for:  redness, tenderness, or signs of infection (pain, swelling, redness, odor or green/yellow discharge around incision site)  As directed    Call MD for:  severe uncontrolled pain  As directed    Call MD for:  temperature >100.4  As directed    Diet - low sodium heart healthy  As directed    Increase activity slowly  As directed        Medication List         ALPRAZolam 0.5 MG tablet  Commonly known as:  XANAX  Take 1 tablet (0.5 mg  total) by mouth 2 (two) times daily.     aspirin 81 MG tablet  Take 81 mg by mouth daily.     b complex vitamins tablet  Take 1 tablet by mouth daily.     diphenhydrAMINE 25 MG tablet  Commonly known as:  BENADRYL  Take 25 mg by mouth at bedtime.     esomeprazole 20 MG capsule  Commonly known as:  NEXIUM  Take 20 mg by mouth daily at 12 noon.     feeding supplement (RESOURCE BREEZE) Liqd  Take 1 Container by mouth 2 (two) times daily between meals.     furosemide 20  MG tablet  Commonly known as:  LASIX  Take 1 tablet (20 mg total) by mouth daily.     lipase/protease/amylase 12000 UNITS Cpep capsule  Commonly known as:  CREON-12/PANCREASE  Take 2 capsules by mouth 3 (three) times daily with meals.     loratadine 10 MG tablet  Commonly known as:  CLARITIN  Take 10 mg by mouth daily.     meperidine 50 MG tablet  Commonly known as:  DEMEROL  Take 50 mg by mouth every 6 (six) hours as needed for moderate pain or severe pain.     naproxen sodium 220 MG tablet  Commonly known as:  ANAPROX  Take 220 mg by mouth 2 (two) times daily as needed (pain).     omeprazole 20 MG capsule  Commonly known as:  PRILOSEC  Take 1 capsule (20 mg total) by mouth daily.     oxyCODONE-acetaminophen 5-325 MG per tablet  Commonly known as:  PERCOCET/ROXICET  Take 1-2 tablets by mouth every 4 (four) hours as needed (for pain).     PARoxetine 10 MG tablet  Commonly known as:  PAXIL  Take 1 tablet (10 mg total) by mouth daily.     potassium chloride 10 MEQ tablet  Commonly known as:  K-DUR  Take 1 tablet (10 mEq total) by mouth daily.     PRESERVISION AREDS PO  Take 1 tablet by mouth daily.     VITAMIN C PO  Take 1 tablet by mouth 2 (two) times daily.           Follow-up Information   Follow up with [facility], MD In 2 weeks.   Specialty:  General Surgery   Contact information:   [address] [place] [address] [phone]       Signed: [name] 12/27/2013, 7:26 AM

## 2013-12-27 NOTE — Discharge Instructions (Signed)
[facility]      [facility] °[phone] ° °ABDOMINAL SURGERY: POST OP INSTRUCTIONS ° °Always review your discharge instruction sheet given to you by the facility where your surgery was performed. ° °IF YOU HAVE DISABILITY OR FAMILY LEAVE FORMS, YOU MUST BRING THEM TO THE OFFICE FOR PROCESSING.  PLEASE DO NOT GIVE THEM TO YOUR DOCTOR. ° °1. A prescription for pain medication may be given to you upon discharge.  Take your pain medication as prescribed, if needed.  If narcotic pain medicine is not needed, then you may take acetaminophen (Tylenol) or ibuprofen (Advil) as needed. °2. Take your usually prescribed medications unless otherwise directed. °3. If you need a refill on your pain medication, please contact your pharmacy. They will contact our office to request authorization.  Prescriptions will not be filled after 5pm or on week-ends. °4. You should follow a light diet the first few days after arrival home, such as soup and crackers, pudding, etc.unless your doctor has advised otherwise. A high-fiber, low fat diet can be resumed as tolerated.   Be sure to include lots of fluids daily. Most patients will experience some swelling and bruising on the chest and neck area.  Ice packs will help.  Swelling and bruising can take several days to resolve °5. Most patients will experience some swelling and bruising in the area of the incision. Ice pack will help. Swelling and bruising can take several days to resolve..  °6. It is common to experience some constipation if taking pain medication after surgery.  Increasing fluid intake and taking a stool softener will usually help or prevent this problem from occurring.  A mild laxative (Milk of Magnesia or Miralax) should be taken according to package directions if there are no bowel movements after 48 hours. °7.  You may have steri-strips (small skin tapes) in place directly over the incision.  These strips should be left on the skin for 10-14 days.  If your  surgeon used skin glue on the incision, you may shower in 48 hours.  The glue will flake off over the next 2-3 weeks.  Any sutures or staples will be removed at the office during your follow-up visit. You may find that a light gauze bandage over your incision may keep your staples from being rubbed or pulled. You may shower and replace the bandage daily. °8. ACTIVITIES:  You may resume regular (light) daily activities beginning the next day--such as daily self-care, walking, climbing stairs--gradually increasing activities as tolerated.  You may have sexual intercourse when it is comfortable.  Refrain from any heavy lifting or straining until approved by your doctor. °a. You may drive when you no longer are taking prescription pain medication, you can comfortably wear a seatbelt, and you can safely maneuver your car and apply brakes °b. Return to Work: __________8 weeks if applicable_________________________ °9. You should see your doctor in the office for a follow-up appointment approximately two weeks after your surgery.  Make sure that you call for this appointment within a day or two after you arrive home to insure a convenient appointment time. °OTHER INSTRUCTIONS:  °_____________________________________________________________ °_____________________________________________________________ ° °WHEN TO CALL YOUR DOCTOR: °1. Fever over 101.0 °2. Inability to urinate °3. Nausea and/or vomiting °4. Extreme swelling or bruising °5. Continued bleeding from incision. °6. Increased pain, redness, or drainage from the incision. °7. Difficulty swallowing or breathing °8. Muscle cramping or spasms. °9. Numbness or tingling in hands or feet or around lips. ° °The clinic staff is   available to answer your questions during regular business hours.  Please don’t hesitate to call and ask to speak to one of the nurses if you have concerns. ° °For further questions, please visit [email or web address] ° ° ° °

## 2013-12-27 NOTE — Progress Notes (Signed)
Discharge instructions and prescriptions given and explained to pt. And pt.'s husband.  Pt. View DM video and was educated on taking blood sugars at home.  Pt. Had no questions for me.  IV removed by IV team.  Dressing from [place] removal changed and is clean, dry, and intact.  Pt. Discharged to home in stable condition with husband. Syliva Overman

## 2013-12-28 ENCOUNTER — Other Ambulatory Visit: Payer: Medicare HMO

## 2014-01-01 ENCOUNTER — Ambulatory Visit: Payer: Medicare HMO | Admitting: Oncology

## 2014-01-04 ENCOUNTER — Other Ambulatory Visit: Payer: Medicare HMO

## 2014-01-04 ENCOUNTER — Telehealth: Payer: Self-pay | Admitting: *Deleted

## 2014-01-04 NOTE — Telephone Encounter (Signed)
Received ok to change lab appt for today to Monday to fit pt's schedule w/ the genetics.  Confirmed 01/08/14 genetic appt and emailed Santiago Glad P to add the appt since I was unable & to make her aware.

## 2014-01-05 ENCOUNTER — Encounter: Payer: Self-pay | Admitting: Radiation Oncology

## 2014-01-05 ENCOUNTER — Ambulatory Visit (INDEPENDENT_AMBULATORY_CARE_PROVIDER_SITE_OTHER): Payer: Medicare Other | Admitting: General Surgery

## 2014-01-05 ENCOUNTER — Encounter (INDEPENDENT_AMBULATORY_CARE_PROVIDER_SITE_OTHER): Payer: Self-pay | Admitting: General Surgery

## 2014-01-05 ENCOUNTER — Telehealth (INDEPENDENT_AMBULATORY_CARE_PROVIDER_SITE_OTHER): Payer: Self-pay

## 2014-01-05 VITALS — BP 122/70 | HR 75 | Temp 99.1°F | Resp 14 | Ht 65.0 in | Wt 148.0 lb

## 2014-01-05 DIAGNOSIS — R609 Edema, unspecified: Secondary | ICD-10-CM

## 2014-01-05 DIAGNOSIS — C259 Malignant neoplasm of pancreas, unspecified: Secondary | ICD-10-CM

## 2014-01-05 NOTE — Assessment & Plan Note (Signed)
Appears resolved.  We will stop lasix and potassium.  She will follow up with Dr. Renold Genta if this recurs.

## 2014-01-05 NOTE — Patient Instructions (Signed)
Ok to start liberalizing diet.    If you are going to eat a rich meal (cream/cheese, bacon, etc), may need to increase creon by additional 1-2 capsules for that meal.    Follow up with me in 8 weeks unless you have issues.

## 2014-01-05 NOTE — Progress Notes (Signed)
HISTORY: Pt is around 2 weeks s/p open distal pancreatectomy/splenectomy/partial gastrectomy/cholecystectomy for chronic cholecystitis, pancreatic cancer, panc pseudocyst, and splenic infarct.  She is doing well.  She is eating OK.  She is not having fevers or chills.  She denies nausea or vomiting.  She is not having severe diarrhea.  She always has some loose stools for her baseline colitis, but this is controlled.  She had taken her blood sugars twice daily, and these have been between 85 and 137.  Most are around 100-110.      EXAM: General:  Alert and oriented.   Incision:  Healing well.  Steri strips removed.    PATHOLOGY: Diagnosis 1. Pancreas, resection, Distal - INVASIVE DUCTAL ADENOCARCINOMA, MODERATELY DIFFERENTIATED. - PANCREATIC RESECTION MARGINS (ORIGINAL AND FINAL) ARE POSITIVE FOR CARCINOMA. - PERINEURAL INVASION PRESENT. - NO LYMPHOVASCULAR INVASION IDENTIFIED. - BACKGROUND ACUTE HEMORRHAGIC AND CHRONIC PANCREATITIS. - TWELVE OF TWELVE LYMPH NODES NEGATIVE FOR CARCINOMA (0/12). 2. Gallbladder - CHRONIC CHOLECYSTITIS WITH CHOLELITHIASIS. - CHOLESTEROLOSIS. - NO MALIGNANCY IDENTIFIED. 3. Spleen, Spleen and small fragment of distal pancreas - BENIGN SPLEEN WITH ADHERENT STOMACH AND PANCREAS. - STOMACH WITH MILD CHRONIC GASTRITIS. - PANCREAS WITH ACUTE HEMORRHAGIC AND CHRONIC PANCREATITIS WITH PSEUDOCYST AND ABCESS FORMATION. - FIVE OF FIVE LYMPH NODES NEGATIVE FOR CARCINOMA (0/5). - NO MALIGNANCY IDENTIFIED. 4. Pancreas, resection - ADENOCARCINOMA, INVOLVING FINAL RESECTION MARGIN. - BACKGROUND ACUTE AND CHRONIC PANCREATITIS. - ONE OF ONE LYMPH NODES NEGATIVE FOR CARCINOMA (0/1). 5. Lymph node, biopsy - ONE OF ONE LYMPH NODES NEGATIVE FOR CARCINOMA (0/1).   ASSESSMENT AND PLAN:   Carcinoma of pancreas Will follow up with Dr. Magrinat and Dr. Manning regarding adjuvant treatment.  Dr. Magrinat will let me know about need for port a cath.  I will see her back  in 6-8 weeks.    Pt clinically well.    Peripheral edema Appears resolved.  We will stop lasix and potassium.  She will follow up with Dr. Baxley if this recurs.    Pancreatic exocrine insufficiency - taking creon, controlled symptoms.  Hyperglycemia - improved.  Will follow up this with PCP if needed.  Going to once daily FSG as long as those are OK.      Annette Beltran L Annette Bernardi, MD Surgical Oncology, General & Endocrine Surgery [facility]  Beltran,Annette J, MD Beltran, Annette J, MD    

## 2014-01-05 NOTE — Progress Notes (Signed)
GI Location of Tumor / Histology: subtle 2.5 cm soft tissue mass in the mid pancreas abutting the splenic vein  Patient presented hospitalized for abdominal pain 2012  Biopsies revealed:  PATHOLOGY:  Diagnosis  1. Pancreas, resection, Distal  - INVASIVE DUCTAL ADENOCARCINOMA, MODERATELY DIFFERENTIATED.  - PANCREATIC RESECTION MARGINS (ORIGINAL AND FINAL) ARE POSITIVE FOR CARCINOMA.  - PERINEURAL INVASION PRESENT.  - NO LYMPHOVASCULAR INVASION IDENTIFIED.  - BACKGROUND ACUTE HEMORRHAGIC AND CHRONIC PANCREATITIS.  - TWELVE OF TWELVE LYMPH NODES NEGATIVE FOR CARCINOMA (0/12).  2. Gallbladder  - CHRONIC CHOLECYSTITIS WITH CHOLELITHIASIS.  - CHOLESTEROLOSIS.  - NO MALIGNANCY IDENTIFIED.  3. Spleen, Spleen and small fragment of distal pancreas  - BENIGN SPLEEN WITH ADHERENT STOMACH AND PANCREAS.  - STOMACH WITH MILD CHRONIC GASTRITIS.  - PANCREAS WITH ACUTE HEMORRHAGIC AND CHRONIC PANCREATITIS WITH PSEUDOCYST  AND ABCESS FORMATION.  - FIVE OF FIVE LYMPH NODES NEGATIVE FOR CARCINOMA (0/5).  - NO MALIGNANCY IDENTIFIED.  4. Pancreas, resection  - ADENOCARCINOMA, INVOLVING FINAL RESECTION MARGIN.  - BACKGROUND ACUTE AND CHRONIC PANCREATITIS.  - ONE OF ONE LYMPH NODES NEGATIVE FOR CARCINOMA (0/1).  5. Lymph node, biopsy  - ONE OF ONE LYMPH NODES NEGATIVE FOR CARCINOMA (0/1).   Past/Anticipated interventions by surgeon, if any: open distal pancreatectomy/splenectomy/partial gastrectomy/cholecystectomy   Past/Anticipated interventions by medical oncology, if any: Magrinat's suggestion is 3 months of chemotherapy before adding radiation  Weight changes, if any: 20 lb over the last few months  Bowel/Bladder complaints, if any: loose stool related to colitis  Nausea / Vomiting, if any: Denies nausea or vomiting following surgery  Pain issues, if any:    SAFETY ISSUES:  Prior radiation? YES  Pacemaker/ICD? NO  Possible current pregnancy? NO  Is the patient on methotrexate?  NO  Current Complaints / other details:  68 years old. Married.

## 2014-01-05 NOTE — Telephone Encounter (Signed)
Okay for pt to stop lasix and potassium, but should call Dr. Renold Genta, her PCP, if she notices additional swelling.  Pt agreed.

## 2014-01-05 NOTE — Assessment & Plan Note (Signed)
Will follow up with Dr. Jana Hakim and Dr. Tammi Klippel regarding adjuvant treatment.  Dr. Jana Hakim will let me know about need for port a cath.  I will see her back in 6-8 weeks.    Pt clinically well.

## 2014-01-08 ENCOUNTER — Other Ambulatory Visit (HOSPITAL_BASED_OUTPATIENT_CLINIC_OR_DEPARTMENT_OTHER): Payer: Medicare Other

## 2014-01-08 ENCOUNTER — Ambulatory Visit
Admission: RE | Admit: 2014-01-08 | Discharge: 2014-01-08 | Disposition: A | Discharge status: Home / Self Care | Payer: Medicare Other | Source: Ambulatory Visit | Attending: Radiation Oncology | Admitting: Radiation Oncology

## 2014-01-08 ENCOUNTER — Ambulatory Visit (HOSPITAL_BASED_OUTPATIENT_CLINIC_OR_DEPARTMENT_OTHER): Payer: Medicare Other | Admitting: Genetic Counselor

## 2014-01-08 ENCOUNTER — Encounter: Payer: Self-pay | Admitting: Radiation Oncology

## 2014-01-08 ENCOUNTER — Encounter: Payer: Self-pay | Admitting: Genetic Counselor

## 2014-01-08 VITALS — BP 120/58 | HR 79 | Temp 98.0°F | Resp 16 | Ht 65.0 in | Wt 152.5 lb

## 2014-01-08 DIAGNOSIS — H3530 Unspecified macular degeneration: Secondary | ICD-10-CM | POA: Insufficient documentation

## 2014-01-08 DIAGNOSIS — Z853 Personal history of malignant neoplasm of breast: Secondary | ICD-10-CM | POA: Insufficient documentation

## 2014-01-08 DIAGNOSIS — C50911 Malignant neoplasm of unspecified site of right female breast: Secondary | ICD-10-CM

## 2014-01-08 DIAGNOSIS — C259 Malignant neoplasm of pancreas, unspecified: Secondary | ICD-10-CM

## 2014-01-08 DIAGNOSIS — Z9089 Acquired absence of other organs: Secondary | ICD-10-CM | POA: Insufficient documentation

## 2014-01-08 DIAGNOSIS — Z7982 Long term (current) use of aspirin: Secondary | ICD-10-CM | POA: Insufficient documentation

## 2014-01-08 DIAGNOSIS — IMO0002 Reserved for concepts with insufficient information to code with codable children: Secondary | ICD-10-CM

## 2014-01-08 DIAGNOSIS — Z800 Family history of malignant neoplasm of digestive organs: Secondary | ICD-10-CM

## 2014-01-08 DIAGNOSIS — Z803 Family history of malignant neoplasm of breast: Secondary | ICD-10-CM

## 2014-01-08 DIAGNOSIS — F411 Generalized anxiety disorder: Secondary | ICD-10-CM | POA: Insufficient documentation

## 2014-01-08 DIAGNOSIS — Z8042 Family history of malignant neoplasm of prostate: Secondary | ICD-10-CM

## 2014-01-08 DIAGNOSIS — Z808 Family history of malignant neoplasm of other organs or systems: Secondary | ICD-10-CM

## 2014-01-08 DIAGNOSIS — Z79899 Other long term (current) drug therapy: Secondary | ICD-10-CM | POA: Insufficient documentation

## 2014-01-08 LAB — CBC WITH DIFFERENTIAL/PLATELET
BASO%: 1.2 % (ref 0.0–2.0)
Basophils Absolute: 0.1 10*3/uL (ref 0.0–0.1)
EOS ABS: 0.7 10*3/uL — AB (ref 0.0–0.5)
EOS%: 6.6 % (ref 0.0–7.0)
HEMATOCRIT: 33.9 % — AB (ref 34.8–46.6)
HEMOGLOBIN: 11.3 g/dL — AB (ref 11.6–15.9)
LYMPH%: 21.6 % (ref 14.0–49.7)
MCH: 28 pg (ref 25.1–34.0)
MCHC: 33.3 g/dL (ref 31.5–36.0)
MCV: 83.9 fL (ref 79.5–101.0)
MONO#: 0.8 10*3/uL (ref 0.1–0.9)
MONO%: 8.4 % (ref 0.0–14.0)
NEUT%: 62.2 % (ref 38.4–76.8)
NEUTROS ABS: 6.2 10*3/uL (ref 1.5–6.5)
PLATELETS: 951 10*3/uL — AB (ref 145–400)
RBC: 4.04 10*6/uL (ref 3.70–5.45)
RDW: 15.6 % — ABNORMAL HIGH (ref 11.2–14.5)
WBC: 10 10*3/uL (ref 3.9–10.3)
lymph#: 2.2 10*3/uL (ref 0.9–3.3)
nRBC: 0 % (ref 0–0)

## 2014-01-08 LAB — COMPREHENSIVE METABOLIC PANEL (CC13)
ALT: 14 U/L (ref 0–55)
ANION GAP: 10 meq/L (ref 3–11)
AST: 17 U/L (ref 5–34)
Albumin: 3.9 g/dL (ref 3.5–5.0)
Alkaline Phosphatase: 93 U/L (ref 40–150)
BUN: 13.1 mg/dL (ref 7.0–26.0)
CALCIUM: 9.7 mg/dL (ref 8.4–10.4)
CHLORIDE: 106 meq/L (ref 98–109)
CO2: 25 mEq/L (ref 22–29)
Creatinine: 0.7 mg/dL (ref 0.6–1.1)
Glucose: 111 mg/dl (ref 70–140)
POTASSIUM: 4 meq/L (ref 3.5–5.1)
Sodium: 141 mEq/L (ref 136–145)
Total Bilirubin: 0.25 mg/dL (ref 0.20–1.20)
Total Protein: 7.4 g/dL (ref 6.4–8.3)

## 2014-01-08 LAB — LIPASE: LIPASE: 154 U/L — AB (ref 0–75)

## 2014-01-08 LAB — CANCER ANTIGEN 19-9: CA 19-9: 17.9 U/mL (ref <35.0)

## 2014-01-08 LAB — AMYLASE: Amylase: 71 U/L (ref 0–105)

## 2014-01-08 NOTE — Progress Notes (Signed)
Dr.  [name] requested a consultation for genetic counseling and risk assessment for Annette Beltran, a 68 y.o. female, for discussion of her personal history of breast and pancreatic cancer and family history of breast, pancreatic, prostate cancer and melanoma.  She presents to clinic today to discuss the possibility of a genetic predisposition to cancer, and to further clarify her risks, as well as her family members' risks for cancer.   HISTORY OF PRESENT ILLNESS: In 2002, at the age of 17, Annette Beltran was diagnosed with invasive ductal carcinoma of the right breast. This was treated with lumpectomy, radiation and tamoxifen. The tumor was ER+/PR+/Her2-.  In 2015, at the age of 19, she was diagnosed with stage II pancreatic cancer.  This has been treated with surgery.  She is going to have chemotherapy and radiation.  She is up to date on her colonoscopy.    Past Medical History  Diagnosis Date  . Macular degeneration   . Fluid retention   . Colitis, collagenous   . Vitamin D deficiency   . Osteopenia   . Celiac artery stenosis   . Splenic infarction   . Arthritis   . History of blood clots   . Anxiety   . Shingles   . Pancreatitis   . Osteoporosis   . Clotting disorder   . Breast cancer     rt lumpectomy  . Arrhythmia     "skips a beat"  [facility]  heart    Past Surgical History  Procedure Laterality Date  . Foot surgery  2003    x 3, 2 on right, 1 on left  . Breast lumpectomy Right     radiation for 6 weeks  . Meniscus repair Right   . Eus N/A 11/23/2013    Procedure: UPPER ENDOSCOPIC ULTRASOUND (EUS) LINEAR;  Surgeon: Milus Banister, MD;  Location: [facility];  Service: Endoscopy;  Laterality: N/A;  . Tonsillectomy    . Laparoscopy N/A 12/20/2013    Procedure: LAPAROSCOPY DIAGNOSTIC ;  Surgeon: [name], MD;  Location: [place];  Service: General;  Laterality: N/A;  . Cholecystectomy N/A 12/20/2013    Procedure: LAPAROSCOPIC CHOLECYSTECTOMY;  Surgeon: [name], MD;  Location: [place];  Service: General;  Laterality: N/A;  . Splenectomy, total N/A 12/20/2013    Procedure: SPLENECTOMY;  Surgeon: [name], MD;  Location: [place];  Service: General;  Laterality: N/A;  . Partial gastrectomy N/A 12/20/2013    Procedure: PARTIAL GASTRECTOMY;  Surgeon: [name], MD;  Location: [employer];  Service: General;  Laterality: N/A;    History   Social History  . Marital Status: Married    Spouse Name: N/A    Number of Children: 2  . Years of Education: N/A   Occupational History  . Self employed     Social History Main Topics  . Smoking status: Former Smoker -- 0.50 packs/day for 28 years    Types: Cigarettes    Quit date: 11/02/2000  . Smokeless tobacco: Never Used  . Alcohol Use: Yes     Comment: 1-2 alcohol drinks/day- NONE AS OF 11-20-2013  . Drug Use: No  . Sexual Activity: None   Other Topics Concern  . None   Social History Narrative   2 caffeine drinks daily     REPRODUCTIVE HISTORY AND PERSONAL RISK ASSESSMENT FACTORS: Menarche was at age 56.   postmenopausal Uterus Intact: yes Ovaries Intact: yes G2P2A0, first live birth at age 9  She has not previously  undergone treatment for infertility.   Oral Contraceptive use: 0 years   She has used HRT in the past.    FAMILY HISTORY:  We obtained a detailed, 4-generation family history.  Significant diagnoses are listed below: Family History  Problem Relation Age of Onset  . Colon cancer Neg Hx   . Aneurysm Mother   . Stroke Mother   . Prostate cancer Father 49  . Skin cancer Brother 53    treated with interferon for 1 year  . Heart failure Paternal Aunt   . Breast cancer Paternal Aunt     dx over 72s  . Pancreatic cancer Maternal Grandmother     dx in her 44s  . Breast cancer Maternal Aunt     dx over 16  . Rheum arthritis Brother   . Breast cancer Paternal Aunt     dx over 56s  . Leukemia Paternal Aunt   The patient's mother is one of nine children and her father was  one of ten children.    Patient's maternal ancestors are of [place] and [place] descent, and paternal ancestors are of [place] descent. There is no reported Ashkenazi Jewish ancestry. There is no known consanguinity.  GENETIC COUNSELING ASSESSMENT: Annette Beltran is a 68 y.o. female with a personal history of breast and pancreatic cancer and family history of melanoma, breast, prostate and pancreatic cancer which somewhat suggestive of a hereditary cancer syndrome and predisposition to cancer. We, therefore, discussed and recommended the following at today's visit.   DISCUSSION: We reviewed the characteristics, features and inheritance patterns of hereditary cancer syndromes. We also discussed genetic testing, including the appropriate family members to test, the process of testing, insurance coverage and turn-around-time for results. We reviewed hereditary cancer genes that are associated with both breast and pancreatic cancer including BRCA mutation and PALB2 mutations.  We discussed that there are several other genes that are associated with simply breast or just pancreatic cancer, but not combining the two.  Therefore we will offer the Comprehensive cancer panel to obtain information on all of these genes.  PLAN: After considering the risks, benefits, and limitations, Annette Beltran provided informed consent to pursue genetic testing and the blood sample will be sent to [facility] for analysis of the Comprehensive Cancer panel. We discussed the implications of a positive, negative and/ or variant of uncertain significance genetic test result. Results should be available within approximately 3 weeks' time, at which point they will be disclosed by telephone to Annette Beltran, as will any additional recommendations warranted by these results. Annette Beltran will receive a summary of her genetic counseling visit and a copy of her results once available. This information will also be available in Epic. We  encouraged Annette Beltran to remain in contact with cancer genetics annually so that we can continuously update the family history and inform her of any changes in cancer genetics and testing that may be of benefit for her family. Annette Beltran questions were answered to her satisfaction today. Our contact information was provided should additional questions or concerns arise.  The patient was seen for a total of 60 minutes, greater than 50% of which was spent face-to-face counseling.  This note will also be sent to the referring provider via the electronic medical record. The patient will be supplied with a summary of this genetic counseling discussion as well as educational information on the discussed hereditary cancer syndromes following the conclusion of their visit.   Patient was discussed with Dr. Bernell List  Humphrey Rolls.   _______________________________________________________________________ For Office Staff:  Number of people involved in session: 2 Was an Intern/ student involved with case: yes

## 2014-01-08 NOTE — Progress Notes (Signed)
See progress note under physician encounter. 

## 2014-01-08 NOTE — Progress Notes (Signed)
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 female who presented with left chest and abdominal pain. CT of the abdomen and pelvis on 08/22/2013.  This showed some cystic changes in the distal pancreas but no distinct abnormality.   Due to ongoing intermittent left upper abdominal pain, she underwent MRCP on 09/03/13. This study was consistent with pancreatitis with atrophy of the pancreatic body and tail small fluid collections adjacent to the pancreatic tail were felt to be likely small pseudocysts the patient lost noted of chronic splenic vein thrombosis  Because of ongoing symptoms and the presence of pancreatic cysts, the patient underwent ultrasound of the abdomen on 11/13/2013. This study showed a hypoechoic lobulated-appearing focus measuring up to 7.4 cm in the region of the pancreatic tail prompting repeat abdominal CT on 11/14/2013. The CT showed a 4.5 cm new rim-enhancing fluid collection in the pancreatic tail and gastrosplenic ligament consistent with pancreatic pseudocysts.   She subsequently underwent an endoscopic ultrasound on 11/23/2013.  At that time, the patient was confirmed to have a 5 cm pancreatic fluid collection at the tail of the pancreas which was aspirated. The main pancreatic duct was normal in the head and neck however, there was an irregular transition in the body of the pancreas with a very subtle 2.5 cm mass which was hypoechoic.  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 The patient lost 20 pounds from October through January. In  further staging, she was found to have a pleural effusion on left. This was aspirated on 1/30 and cytology was negative for malignant cells.   On 12/20/2013, the patient underwent diagnostic laparoscopy with laparoscopic cholecystectomy, open extended distal pancreatectomy, splenectomy, partial gastrectomy, and intraoperative ultrasound of the pancreas.  Pathology revealed a 1.8 cm pancreatic ductal adenocarcinoma. 19 lymph nodes were sampled and none contained metastatic involvement. The pancreas surgical margins were positive for carcinoma  PREVIOUS RADIATION THERAPY: Yes her right breast was treated to 50 Gy in 25 fractions of 2 Gy followed by a boost to the lumpectomy site to 60 Gy total in July and August of 2002.  PAST MEDICAL HISTORY:  has a past medical history of Macular degeneration; Fluid retention; Colitis, collagenous; Vitamin D deficiency; Osteopenia; Celiac artery stenosis; Splenic infarction; Arthritis; History of blood clots; Anxiety; Shingles; Pancreatitis; Osteoporosis; Clotting disorder; Breast cancer; and Arrhythmia.    PAST SURGICAL HISTORY: Past Surgical History  Procedure Laterality Date  . Foot surgery  2003    x 3, 2 on right, 1 on left  . Breast lumpectomy Right     radiation for 6 weeks  . Meniscus repair Right   . Eus N/A 11/23/2013    Procedure: UPPER ENDOSCOPIC ULTRASOUND (EUS) LINEAR;  Surgeon: Milus Banister, MD;  Location: [facility];  Service: Endoscopy;  Laterality: N/A;  . Tonsillectomy    . Laparoscopy N/A 12/20/2013    Procedure: LAPAROSCOPY DIAGNOSTIC ;  Surgeon: [name], MD;  Location: [place];  Service: General;  Laterality: N/A;  . Cholecystectomy N/A 12/20/2013    Procedure: LAPAROSCOPIC CHOLECYSTECTOMY;  Surgeon: [name], MD;  Location: [facility];  Service: General;  Laterality: N/A;  . Splenectomy, total N/A 12/20/2013    Procedure: SPLENECTOMY;  Surgeon: [name], MD;  Location: [place];  Service: General;  Laterality: N/A;  . Partial  gastrectomy N/A 12/20/2013    Procedure: PARTIAL GASTRECTOMY;  Surgeon: [name], MD;  Location: [place];  Service: General;  Laterality: N/A;    FAMILY HISTORY: family history includes Aneurysm in her mother; Breast cancer in her maternal aunt, paternal aunt, and paternal aunt; Heart failure in her paternal aunt; Leukemia in her paternal aunt; Pancreatic cancer in her maternal grandmother; Prostate cancer (age of onset: 108) in her father; Rheum arthritis in her brother; Skin cancer (age of onset: 17) in her brother; Stroke in her mother. There is no history of Colon cancer.  SOCIAL HISTORY:  reports that she quit smoking about 13 years ago. Her smoking use included Cigarettes. She has a 14 pack-year smoking history. She has never used smokeless tobacco. She reports that she drinks alcohol. She reports that she does not use illicit drugs.  ALLERGIES: Codeine; Lactose intolerance (gi); Tequin; and Penicillins  MEDICATIONS:  Current Outpatient Prescriptions  Medication Sig Dispense Refill  . ALPRAZolam (XANAX) 0.5 MG tablet Take 1 tablet (0.5 mg total) by mouth 2 (two) times daily.  60 tablet  3  . Ascorbic Acid (VITAMIN C PO) Take 1 tablet by mouth 2 (two) times daily.      Marland Kitchen aspirin 81 MG tablet Take 81 mg by mouth daily.      Marland Kitchen b complex vitamins tablet Take 1 tablet by mouth daily.      . diphenhydrAMINE (BENADRYL) 25 MG tablet Take 25 mg by mouth at bedtime.       Marland Kitchen esomeprazole (NEXIUM) 20 MG capsule Take 20 mg by mouth daily at 12 noon.      . lipase/protease/amylase (CREON-12/PANCREASE) 12000 UNITS CPEP capsule Take 2 capsules by mouth 3 (three) times daily with meals.  270 capsule  3  . loratadine (CLARITIN) 10 MG tablet Take 10 mg by mouth daily.      . naproxen sodium (ANAPROX) 220 MG tablet Take 220 mg by mouth 2 (two) times daily as needed (pain).       Marland Kitchen PARoxetine (PAXIL) 10 MG tablet Take 1 tablet (10 mg total) by mouth daily.  30 tablet  5  . feeding supplement, RESOURCE  BREEZE, (RESOURCE BREEZE) LIQD Take 1 Container by mouth 2 (two) times daily between meals.  30 Container  3  . oxyCODONE-acetaminophen (PERCOCET/ROXICET) 5-325 MG per tablet Take 1-2 tablets by mouth every 4 (four) hours as needed (for pain).  40 tablet  0   No current facility-administered medications for this encounter.    REVIEW OF SYSTEMS:  A 15 point review of systems is documented in the electronic medical record. This was obtained by the nursing staff. However, I reviewed this with the patient to discuss relevant findings and make appropriate changes.  Pertinent items are noted in HPI.   PHYSICAL EXAM:  height is 5\' 5"  (1.651 m) and weight is 152 lb 8 oz (69.174 kg). Her oral temperature is 98 F (36.7 C). Her blood pressure is 120/58 and her pulse is 79. Her respiration is 16 and oxygen saturation is 100%.   No exam performed today, I had an upper respiratory infection and did not want to place the patient at risk of catching it.Marland Kitchen  KPS = 90  100 - Normal; no complaints; no evidence of  disease. 90   - Able to carry on normal activity; minor signs or symptoms of disease. 80   - Normal activity with effort; some signs or symptoms of disease. 22   - Cares for self; unable to carry on normal activity or to do active work. 60   - Requires occasional assistance, but is able to care for most of his personal needs. 50   - Requires considerable assistance and frequent medical care. 34   - Disabled; requires special care and assistance. 70   - Severely disabled; hospital admission is indicated although death not imminent. 43   - Very sick; hospital admission necessary; active supportive treatment necessary. 10   - Moribund; fatal processes progressing rapidly. 0     - Dead  Karnofsky DA, Abelmann [place], Craver LS and Burchenal JH [phone]) The use of the nitrogen mustards in the palliative treatment of carcinoma: with particular reference to bronchogenic carcinoma Cancer 1 634-56  LABORATORY  DATA:  Lab Results  Component Value Date   WBC 10.0 01/08/2014   HGB 11.3* 01/08/2014   HCT 33.9* 01/08/2014   MCV 83.9 01/08/2014   PLT 951* 01/08/2014   Lab Results  Component Value Date   NA 141 01/08/2014   K 4.0 01/08/2014   CL 101 12/26/2013   CO2 25 01/08/2014   Lab Results  Component Value Date   ALT 14 01/08/2014   AST 17 01/08/2014   ALKPHOS 93 01/08/2014   BILITOT 0.25 01/08/2014     RADIOGRAPHY: Dg Chest Port 1 View  12/23/2013   CLINICAL DATA:  Worsening hypoxia. Breast cancer. Pancreatic carcinoma.  EXAM: PORTABLE CHEST - 1 VIEW  COMPARISON:  12/21/13  FINDINGS: Low lung volumes are demonstrated with increased atelectasis and probable small bilateral pleural effusions the lung bases. Cardiomegaly is stable. Left arm PICC line remains in appropriate position.  IMPRESSION: Increased bibasilar atelectasis and probable small bilateral pleural effusions. Stable cardiomegaly.   Electronically Signed   By: Earle Gell M.D.   On: 12/23/2013 18:54   Dg Chest Port 1 View  12/21/2013   CLINICAL DATA:  Line placement, history pancreatic cancer, post cholecystectomy, pancreatectomy, splenectomy and partial gastrectomy on 12/20/2013  EXAM: PORTABLE CHEST - 1 VIEW  COMPARISON:  Portable exam 1247 hr compared to 12/01/2013  FINDINGS: Left arm PICC line tip projects over cavoatrial junction.  Nasogastric tube projects over stomach with tip at antrum.  Surgical drain left upper quadrant.  Upper abdominal skin clips and free air noted consistent with preceding abdominal surgery.  Enlargement of cardiac silhouette.  Mild right basilar atelectasis.  Atelectasis versus consolidation left lower lobe.  Upper lungs clear.  IMPRESSION: Enlargement of cardiac silhouette.  Mild right basilar atelectasis.  Atelectasis versus consolidation left lower lobe.   Electronically Signed   By: Lavonia Dana M.D.   On: 12/21/2013 13:18      IMPRESSION: This patient is a very nice 68 year-old woman status post pancreatectomy for a 1.8 cm  adenocarcinoma the body of the pancreas. She had 19 negative lymph nodes. Her pathology was notable for positive surgical margins. Following pancreatectomy, concurrent chemoradiotherapy in the adjuvant setting is recommended by NCCN guidelines regardless of margins and stage. In this particular case, adjuvant radiotherapy to the resection bed may be of particular benefit given the positive surgical margins.  In the absence of metastatic nodal involvement and small size of the primary tumor both portend better outcomes. Hopefully, with adjuvant chemoradiotherapy, the patient will tolerate treatment well and  remained disease-free.  PLAN:Today, I talked to the patient and family about the findings and work-up thus far.  We discussed the natural history of disease and general treatment, highlighting the role or radiotherapy in the management.  We discussed the available radiation techniques, and focused on the details of logistics and delivery.  We reviewed the anticipated acute and late sequelae associated with radiation in this setting.  The patient was encouraged to ask questions that I answered to the best of my ability.  I filled out a patient counseling form during our discussion including treatment diagrams.  We retained a copy for our records.  The patient would like to proceed with radiation and will be scheduled for CT simulation.  I spent 60 minutes minutes face to face with the patient and more than 50% of that time was spent in counseling and/or coordination of care.    ------------------------------------------------  Sheral Apley. Tammi Klippel, M.D.

## 2014-01-08 NOTE — Progress Notes (Signed)
Clarification: Patient presented to the hospital in August 2012 with splenic infarct that caused her abdominal pain. Patient reports that she has taken a daily aspirin since that time. Patient scheduled to follow up with Magrinat on Wednesday. Patient taking Creon as directed. Reports she has experience unintentional weight loss of 30 lb since October. Reports she avoids heavy sweets and fatty foods. Reports her energy level is slowly improving since surgery. Patient reports participating in PT twice per week. Denies hematuria or dysuria. Reports loose stool related to colitis. Reports low back pain related abdominal weakness from surgery.

## 2014-01-09 ENCOUNTER — Encounter: Payer: Self-pay | Admitting: Oncology

## 2014-01-10 ENCOUNTER — Other Ambulatory Visit (INDEPENDENT_AMBULATORY_CARE_PROVIDER_SITE_OTHER): Payer: Self-pay | Admitting: General Surgery

## 2014-01-10 ENCOUNTER — Ambulatory Visit (HOSPITAL_BASED_OUTPATIENT_CLINIC_OR_DEPARTMENT_OTHER): Payer: Medicare Other | Admitting: Oncology

## 2014-01-10 VITALS — BP 116/70 | HR 64 | Temp 97.7°F | Resp 18 | Ht 65.0 in | Wt 148.7 lb

## 2014-01-10 DIAGNOSIS — J90 Pleural effusion, not elsewhere classified: Secondary | ICD-10-CM

## 2014-01-10 DIAGNOSIS — C50911 Malignant neoplasm of unspecified site of right female breast: Secondary | ICD-10-CM

## 2014-01-10 DIAGNOSIS — C251 Malignant neoplasm of body of pancreas: Secondary | ICD-10-CM

## 2014-01-10 DIAGNOSIS — M8580 Other specified disorders of bone density and structure, unspecified site: Secondary | ICD-10-CM

## 2014-01-10 DIAGNOSIS — C252 Malignant neoplasm of tail of pancreas: Secondary | ICD-10-CM

## 2014-01-10 DIAGNOSIS — D735 Infarction of spleen: Secondary | ICD-10-CM

## 2014-01-10 DIAGNOSIS — K52831 Collagenous colitis: Secondary | ICD-10-CM

## 2014-01-10 DIAGNOSIS — Z853 Personal history of malignant neoplasm of breast: Secondary | ICD-10-CM

## 2014-01-10 MED ORDER — DEXTROSE 5 % IV SOLN: 600.0000 mg | INTRAVENOUS | Status: DC

## 2014-01-10 NOTE — Progress Notes (Signed)
[place]  [phone] [phone]     ID: Annette Beltran OB: [birth year]  MR#: [id number]  MSX#:[id number]  PCP: [name], MD GYN:   SU: [name] OTHER MD: Delfin Edis, [name]  CHIEF COMPLAINT: "I haven't felt well since September"  PANCREATIC CANCER HISTORY: I formerly followed Annette Beltran for a stage I breast cancer, which was treated with surgery, radiation, and anti-estrogens. She was released from followup here in 2007. In addition, in 2012 she had abdominal pain leading to hospitalization. 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 The patient also has a history of collagenous colitis, which of course carries its own set of symptoms, making identification of her new attic problem more difficult.  Annette Beltran tells me she started "feeling bad" in September of 2014. There was abdominal discomfort localizing to the left upper o'clock on, worse with inspiration. CT angiography 08/21/2013 showed no clot and no significant abnormalities. Plain rib and left shoulder views were negative. CT abdomen and pelvis with contrast 08/22/2013 showed slight prominence of the pancreatic duct in the distal body and tail with a new small structure emanating from the tail of the pancreas consistent with a pseudocyst. Exudate near the tail of the pancreas suggested pancreatitis. MRI of the abdomen 09/03/2013 confirmed several ill-defined fluid collections between the gastric fundus and the splenic hilum surrounding the pancreatic tail. There was no evidence of a pancreatic mass. The pancreatic tail did enhanced following contrast. The splenic vein appeared chronically thrombosed with a small amount of nonocclusive thrombus extending into the main and left portal  veins.  On 09/26/2013 amylase was 237 and lipase 513.0, consistent with acute pancreatitis. These numbers subsequently dropped in on 11/28/2013 amylase was normal at 47 and lipase was mildly elevated at 110  With continuing left-sided abdominal discomfort, abdominal ultrasound 11/13/2013 showed multiple gallstones with no evidence of cholecystitis. Evaluation of the pancreas showed a hypoechoic lobulated focus measuring up to 7.4 cm felt to be nonspecific. Repeat abdominal CT 11/14/2013 showed diffuse pancreatic atrophy without evidence of a mass, again consistent with chronic pancreatitis. There was mild soft tissue stranding suspicious for mild superimposed acute pancreatitis. A new rim-enhancing fluid collection was noted in the gastrosplenic ligament measuring 4.5 cm, consistent with a pseudocyst. Finally on 11/23/2013, endoscopic ultrasonography under Oretha Caprice showed a subtle 2.5 cm soft tissue mass in the mid-pancreas, abutting the splenic vein. This was biopsied x2, with cytology ([id number]) showing adenocarcinoma. A peripancreatic fluid collection measuring over 5 cm was aspirated yielding 25 cc of chocolate colored fluid. Two or three scattered round hypoechoic lymph nodes were noted near the pancreatic tail but were not sampled.  In addition, on 11/28/2013 a chest x-ray obtained to evaluate upper respiratory symptoms showed a small to moderate left effusion. Chest CT scan 11/29/2013 confirmed a moderate left-sided pleural effusion with no suspicious appearing pulmonary nodules or masses associated with it. The complex cystic lesion associated with the tail of the pancreas was again noted, measuring 5.5 cm despite the recent aspiration procedure.  The patient's subsequent history is as detailed below.  INTERVAL HISTORY: Annette Beltran returns todayfor followup of her pancreatic cancer accompanied by her husband Annette Beltran and her daughters Annette Beltran and Annette Beltran. Since her last visit hereshe underwent  laparoscopic cholecystectomy, open extended distal pancreatectomy, splenectomy, and partial gastrectomy x2  on 12/20/2013. The final pathology ([id number]) showed the pancreatic tail mass to measure 1.8 cm, grade 2, with positive margins. All 19 peripancreatic lymph nodes removed were clear.  She is now ready to discuss adjuvant therapy.  REVIEW OF SYSTEMS: Annette Beltran has recovered uneventfully from her extensive surgery. There was no fever, bleeding, or unusual pain.She has a good appetite, no nausea or vomiting, and no taste alteration. Her energy is improving and she walked a total of 35 minutes today, in stages. She still has some pain in the LUQ, but stopped taking any pain medication about 5 days ago. She is having "the usual" bowel movements due to her collagenous colitis. A detailed review of systems today was otherwise noncontributory.  PAST MEDICAL HISTORY: Past Medical History  Diagnosis Date  . Macular degeneration   . Fluid retention   . Colitis, collagenous   . Vitamin D deficiency   . Osteopenia   . Celiac artery stenosis   . Splenic infarction   . Arthritis   . History of blood clots   . Anxiety   . Shingles   . Pancreatitis   . Osteoporosis   . Clotting disorder   . Breast cancer     rt lumpectomy  . Arrhythmia     "skips a beat"  [place]  heart    PAST SURGICAL HISTORY: Past Surgical History  Procedure Laterality Date  . Foot surgery  2003    x 3, 2 on right, 1 on left  . Breast lumpectomy Right     radiation for 6 weeks  . Meniscus repair Right   . Eus N/A 11/23/2013    Procedure: UPPER ENDOSCOPIC ULTRASOUND (EUS) LINEAR;  Surgeon: Milus Banister, MD;  Location: [facility];  Service: Endoscopy;  Laterality: N/A;  . Tonsillectomy    . Laparoscopy N/A 12/20/2013    Procedure: LAPAROSCOPY DIAGNOSTIC ;  Surgeon: [name], MD;  Location: [place];  Service: General;  Laterality: N/A;  . Cholecystectomy N/A 12/20/2013    Procedure: LAPAROSCOPIC CHOLECYSTECTOMY;   Surgeon: [name], MD;  Location: [place];  Service: General;  Laterality: N/A;  . Splenectomy, total N/A 12/20/2013    Procedure: SPLENECTOMY;  Surgeon: [name], MD;  Location: [place];  Service: General;  Laterality: N/A;  . Partial gastrectomy N/A 12/20/2013    Procedure: PARTIAL GASTRECTOMY;  Surgeon: [name], MD;  Location: [facility];  Service: General;  Laterality: N/A;    FAMILY HISTORY Family History  Problem Relation Age of Onset  . Colon cancer Neg Hx   . Aneurysm Mother   . Stroke Mother   . Prostate cancer Father 65  . Skin cancer Brother 55    treated with interferon for 1 year  . Heart failure Paternal Aunt   . Breast cancer Paternal Aunt     dx over 21s  . Pancreatic cancer Maternal Grandmother     dx in her 77s  . Breast cancer Maternal Aunt     dx over 47  . Rheum arthritis Brother   . Breast cancer Paternal Aunt     dx over 11s  . Leukemia Paternal Aunt    the patient's father had a diagnosis of prostate cancer metastatic to the liver or a died at age 4. The patient's mother died secondary to carotid third brain aneurysm at the age of 49. The patient had 2 brothers, no sisters. One brother has a history of melanoma. The patient's mother is mother was diagnosed with pancreatic cancer in  her 61s. There is a history of breast cancer and second degree relatives, none before the age of 39  GYNECOLOGIC HISTORY:  Menarche age 60, first live birth age 76, the patient is Annette Beltran P2.. Stopped in her early 67s. 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 Her husband of 82 years, Annette Beltran, retired from [employer] and currently works part-time as a Cabin crew with [employer]. Daughter Annette Beltran teaches first grade. Daughter Annette Beltran is a homemaker. The patient has 4 grandchildren Annette Beltran has an additional 5 grandchildren of his own). The patient attends a [employer]    ADVANCED DIRECTIVES: In place   HEALTH MAINTENANCE: History  Substance Use Topics  . Smoking status: Former Smoker -- 0.50 packs/day for 28 years    Types: Cigarettes    Quit date: 11/02/2000  . Smokeless tobacco: Never Used  . Alcohol Use: Yes     Comment: 1-2 alcohol drinks/day- NONE AS OF 11-20-2013     Colonoscopy:  PAP:  Bone density:  Lipid panel:  Mammography:  Allergies  Allergen Reactions  . Codeine Nausea And Vomiting  . Lactose Intolerance (Gi)   . Tequin     Severe stomach pain  . Penicillins Nausea Only and Rash    Current Outpatient Prescriptions  Medication Sig Dispense Refill  . ALPRAZolam (XANAX) 0.5 MG tablet Take 1 tablet (0.5 mg total) by mouth 2 (two) times daily.  60 tablet  3  . Ascorbic Acid (VITAMIN C PO) Take 1 tablet by mouth 2 (two) times daily.      Marland Kitchen aspirin 81 MG tablet Take 81 mg by mouth daily.      Marland Kitchen b complex vitamins tablet Take 1 tablet by mouth daily.      . diphenhydrAMINE (BENADRYL) 25 MG tablet Take 25 mg by mouth at bedtime.       Marland Kitchen esomeprazole (NEXIUM) 20 MG capsule Take 20 mg by mouth daily at 12 noon.      . feeding supplement, RESOURCE BREEZE, (RESOURCE BREEZE) LIQD Take 1 Container by mouth 2 (two) times daily between meals.  30 Container  3  . lipase/protease/amylase (CREON-12/PANCREASE) 12000 UNITS CPEP capsule Take 2 capsules by mouth 3 (three) times daily with meals.  270 capsule  3  . loratadine (CLARITIN) 10 MG tablet Take 10 mg by mouth daily.      . naproxen sodium (ANAPROX) 220 MG tablet Take 220 mg by mouth 2 (two) times daily as needed (pain).       Marland Kitchen oxyCODONE-acetaminophen (PERCOCET/ROXICET) 5-325 MG per tablet Take 1-2 tablets by mouth every 4 (four) hours as needed (for pain).  40 tablet  0  . PARoxetine (PAXIL) 10 MG tablet Take 1 tablet (10 mg total) by mouth daily.  30 tablet  5   No current facility-administered medications for this visit.    OBJECTIVE: Middle-aged white woman in no  acute distress  Filed Vitals:   01/10/14 1704  BP: 116/70  Pulse: 64  Temp: 97.7 F (36.5 C)  Resp: 18     Body mass index is 24.75 kg/(m^2).    ECOG FS:1 - Symptomatic but completely ambulatory  Ocular: Sclerae unicteric, pupils round and equal Ear-nose-throat: Oropharynx clear, dentition in good repair Lymphatic: No cervical or supraclavicular adenopathy Lungs no rales or rhonchi, minimal dullness at the left base  Heart regular rate and rhythm,  no murmur appreciated Abd the midaabdominal incision has healed nicely--there is no dehiscence, erythema, swelling, or unusual induration. The LUQ is soft and NT to moderate palpation. +BS, positive bowel sounds MSK no focal spinal tenderness, no joint edema Neuro: non-focal, well-oriented, positive affect Breasts: Deferred   LAB RESULTS:  CMP     Component Value Date/Time   NA 141 01/08/2014 1613   NA 143 12/26/2013 0620   K 4.0 01/08/2014 1613   K 3.7 12/26/2013 0620   CL 101 12/26/2013 0620   CO2 25 01/08/2014 1613   CO2 32 12/26/2013 0620   GLUCOSE 111 01/08/2014 1613   GLUCOSE 92 12/26/2013 0620   BUN 13.1 01/08/2014 1613   BUN 9 12/26/2013 0620   CREATININE 0.7 01/08/2014 1613   CREATININE 0.57 12/26/2013 0620   CREATININE 0.62 11/28/2013 1635   CALCIUM 9.7 01/08/2014 1613   CALCIUM 8.7 12/26/2013 0620   PROT 7.4 01/08/2014 1613   PROT 5.0* 12/21/2013 0450   ALBUMIN 3.9 01/08/2014 1613   ALBUMIN 2.3* 12/21/2013 0450   AST 17 01/08/2014 1613   AST 42* 12/21/2013 0450   ALT 14 01/08/2014 1613   ALT 25 12/21/2013 0450   ALKPHOS 93 01/08/2014 1613   ALKPHOS 46 12/21/2013 0450   BILITOT 0.25 01/08/2014 1613   BILITOT 0.7 12/21/2013 0450   GFRNONAA >90 12/26/2013 0620   GFRAA >90 12/26/2013 0620    I No results found for this basename: SPEP,  UPEP,   kappa and lambda light chains    Lab Results  Component Value Date   WBC 10.0 01/08/2014   NEUTROABS 6.2 01/08/2014   HGB 11.3* 01/08/2014   HCT 33.9* 01/08/2014   MCV 83.9 01/08/2014   PLT 951* 01/08/2014   Results for Mcphatter, Marlita (MRN [id number]) as of 01/11/2014 06:31  Ref. Range 11/29/2013 15:33 01/08/2014 16:13  CA 19-9 Latest Range: <35.0 U/mL 47.0 (H) 17.9      Chemistry      Component Value Date/Time   NA 141 01/08/2014 1613   NA 143 12/26/2013 0620   K 4.0 01/08/2014 1613   K 3.7 12/26/2013 0620   CL 101 12/26/2013 0620   CO2 25 01/08/2014 1613   CO2 32 12/26/2013 0620   BUN 13.1 01/08/2014 1613   BUN 9 12/26/2013 0620   CREATININE 0.7 01/08/2014 1613   CREATININE 0.57 12/26/2013 0620   CREATININE 0.62 11/28/2013 1635      Component Value Date/Time   CALCIUM 9.7 01/08/2014 1613   CALCIUM 8.7 12/26/2013 0620   ALKPHOS 93 01/08/2014 1613   ALKPHOS 46 12/21/2013 0450   AST 17 01/08/2014 1613   AST 42* 12/21/2013 0450   ALT 14 01/08/2014 1613   ALT 25 12/21/2013 0450   BILITOT 0.25 01/08/2014 1613   BILITOT 0.7 12/21/2013 0450       Lab Results  Component Value Date   LABCA2 8 06/08/2011    No components found with this basename: LABCA125    No results found for this basename: INR,  in the last 168 hours  Urinalysis    Component Value Date/Time   COLORURINE YELLOW 06/07/2011 1942   APPEARANCEUR CLEAR 06/07/2011 1942   LABSPEC 1.009 06/07/2011 1942   PHURINE 6.0 06/07/2011 1942   GLUCOSEU NEGATIVE 06/07/2011 1942   HGBUR NEGATIVE 06/07/2011 1942   BILIRUBINUR neg 12/25/2011 1216   BILIRUBINUR NEGATIVE 06/07/2011 1942   KETONESUR NEGATIVE 06/07/2011 1942   PROTEINUR NEGATIVE 06/07/2011 1942   UROBILINOGEN negative 12/25/2011 1216     UROBILINOGEN 0.2 06/07/2011 1942   NITRITE neg 12/25/2011 1216   NITRITE NEGATIVE 06/07/2011 1942   LEUKOCYTESUR Negative 12/25/2011 1216    STUDIES: Dg Chest Port 1 View  12/23/2013   CLINICAL DATA:  Worsening hypoxia. Breast cancer. Pancreatic carcinoma.  EXAM: PORTABLE CHEST - 1 VIEW  COMPARISON:  12/21/13  FINDINGS: Low lung volumes are demonstrated with increased atelectasis and probable small bilateral pleural effusions the lung bases. Cardiomegaly is stable. Left arm PICC  line remains in appropriate position.  IMPRESSION: Increased bibasilar atelectasis and probable small bilateral pleural effusions. Stable cardiomegaly.   Electronically Signed   By: John  Stahl M.D.   On: 12/23/2013 18:54   Dg Chest Port 1 View  12/21/2013   CLINICAL DATA:  Line placement, history pancreatic cancer, post cholecystectomy, pancreatectomy, splenectomy and partial gastrectomy on 12/20/2013  EXAM: PORTABLE CHEST - 1 VIEW  COMPARISON:  Portable exam 1247 hr compared to 12/01/2013  FINDINGS: Left arm PICC line tip projects over cavoatrial junction.  Nasogastric tube projects over stomach with tip at antrum.  Surgical drain left upper quadrant.  Upper abdominal skin clips and free air noted consistent with preceding abdominal surgery.  Enlargement of cardiac silhouette.  Mild right basilar atelectasis.  Atelectasis versus consolidation left lower lobe.  Upper lungs clear.  IMPRESSION: Enlargement of cardiac silhouette.  Mild right basilar atelectasis.  Atelectasis versus consolidation left lower lobe.   Electronically Signed   By: Mark  Boles M.D.   On: 12/21/2013 13:18   ASSESSMENT: 68 y.o. 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 noted on abdominal MRI November 2014  PANCREATIC CANCER: (3) status post endoscopic ultrasonography of the pancreas 11/23/2013 with cytology  positive for adenocarcinoma, clinical stage T2 NX MX adenocarcinoma associated with a 5.5 cm pseudocyst (which was aspirated); baseline CA 19-9 was 47.0  (4) left pleural effusion status post thoracentesis 12/01/2013, cytology negative  (5) s/plaparoscopic cholecystectomy, open extended distal pancreatectomy, splenectomy, partial gastrectomy x2 12/20/2013 for a pT1 pN0, stage IA invasive ductal adenocarcinoma, grade 2, with positive resection margins  PLAN: We spent well over an hour going over Summerlynn's situation. She understands despite the good news regarding regional lymph nodes there is a significant risk of recurrence, even if margins were negative. She will benefit from adjuvant chemotherapy and radiation, and with optimal adjuvant treatment as planned she will have a 25-30% chance of long term survival.  The plan will be for gemcitabine weekly x 3, followed by radiation given concurrently with capecitabine orally, followed by gemcitabine weekly x 12. Today we discussed the possible side effects, toxicities and complications of gemcitabine. We also discussed access issues and Gwendelyn would prefer a port over a PICC.  She has a "show" at her home 3/23-3/30/2015 and [place] after that. I have sent the dates to Dr Byerly to try to get a port   placed ASAP and we will start chemotherapy 02/01/2014. She should be ready to start radiation around 02/22/2014 at Dr Manning's discretion. We will obtain a new-baseline liver MRI after the first 3 doses of gemcitabine.  Nesa has a good understanding of the overall plan. She agrees with it. She knows the goal of treatment in her case is cure. She will call with any problems that may develop before her next visiti here.  MAGRINAT,GUSTAV C, MD   01/10/2014 7:55 PM 

## 2014-01-11 ENCOUNTER — Telehealth: Payer: Self-pay | Admitting: *Deleted

## 2014-01-11 ENCOUNTER — Encounter: Payer: Self-pay | Admitting: Oncology

## 2014-01-11 MED ORDER — CAPECITABINE 500 MG PO TABS: ORAL_TABLET | ORAL | Status: DC

## 2014-01-11 MED ORDER — ONDANSETRON HCL 8 MG PO TABS: 8.0000 mg | ORAL_TABLET | Freq: Two times a day (BID) | ORAL | Status: DC | PRN

## 2014-01-11 MED ORDER — LORAZEPAM 0.5 MG PO TABS: 0.5000 mg | ORAL_TABLET | Freq: Four times a day (QID) | ORAL | Status: DC | PRN

## 2014-01-11 MED ORDER — PROCHLORPERAZINE MALEATE 10 MG PO TABS: 10.0000 mg | ORAL_TABLET | Freq: Four times a day (QID) | ORAL | Status: DC | PRN

## 2014-01-11 NOTE — Telephone Encounter (Signed)
Received prior authorization request from [facility] For Ondansetron HCl.  Gave request to care management.

## 2014-01-11 NOTE — Telephone Encounter (Signed)
Xeloda Rx faxed to [facility] with copy of demographics and insurance cards.

## 2014-01-11 NOTE — Progress Notes (Signed)
Faxed ondansetron pa form to [employer] °

## 2014-01-11 NOTE — Addendum Note (Signed)
Addended by: Chauncey Cruel on: 01/11/2014 07:20 AM   Modules accepted: Orders

## 2014-01-12 ENCOUNTER — Encounter (HOSPITAL_COMMUNITY): Payer: Self-pay | Admitting: *Deleted

## 2014-01-13 ENCOUNTER — Other Ambulatory Visit: Payer: Self-pay | Admitting: Internal Medicine

## 2014-01-13 NOTE — Telephone Encounter (Signed)
Refilled x 6 months 

## 2014-01-13 NOTE — Telephone Encounter (Signed)
Refill Xanax 0.5 mg at bedtime for 6 months. Patient has been taking this for sleep for some time. Note also she has lorazepam ordered when necessary by Dr. Jana Hakim

## 2014-01-13 NOTE — Telephone Encounter (Signed)
Correction: refilled Xanax 0.5 mg #60 to take twice daily not just at bedtime with 5 refills. Note that Dr. Jana Hakim has given her lorazepam as well.

## 2014-01-15 ENCOUNTER — Ambulatory Visit (HOSPITAL_COMMUNITY): Payer: Medicare Other

## 2014-01-15 ENCOUNTER — Encounter (HOSPITAL_COMMUNITY): Payer: Self-pay | Admitting: *Deleted

## 2014-01-15 ENCOUNTER — Encounter (HOSPITAL_COMMUNITY)
Admission: RE | Disposition: A | Discharge status: Home / Self Care | Payer: Self-pay | Source: Ambulatory Visit | Attending: General Surgery

## 2014-01-15 ENCOUNTER — Ambulatory Visit (HOSPITAL_COMMUNITY)
Admission: RE | Admit: 2014-01-15 | Discharge: 2014-01-15 | Disposition: A | Discharge status: Home / Self Care | Payer: Medicare Other | Source: Ambulatory Visit | Attending: General Surgery | Admitting: General Surgery

## 2014-01-15 ENCOUNTER — Other Ambulatory Visit: Payer: Self-pay

## 2014-01-15 ENCOUNTER — Ambulatory Visit (HOSPITAL_COMMUNITY): Payer: Medicare Other | Admitting: Anesthesiology

## 2014-01-15 ENCOUNTER — Encounter (HOSPITAL_COMMUNITY): Payer: Medicare Other | Admitting: Anesthesiology

## 2014-01-15 ENCOUNTER — Telehealth: Payer: Self-pay | Admitting: Oncology

## 2014-01-15 DIAGNOSIS — Z90411 Acquired partial absence of pancreas: Secondary | ICD-10-CM | POA: Insufficient documentation

## 2014-01-15 DIAGNOSIS — Z9089 Acquired absence of other organs: Secondary | ICD-10-CM | POA: Insufficient documentation

## 2014-01-15 DIAGNOSIS — I10 Essential (primary) hypertension: Secondary | ICD-10-CM | POA: Insufficient documentation

## 2014-01-15 DIAGNOSIS — C259 Malignant neoplasm of pancreas, unspecified: Secondary | ICD-10-CM

## 2014-01-15 DIAGNOSIS — K8689 Other specified diseases of pancreas: Secondary | ICD-10-CM | POA: Insufficient documentation

## 2014-01-15 DIAGNOSIS — K219 Gastro-esophageal reflux disease without esophagitis: Secondary | ICD-10-CM | POA: Insufficient documentation

## 2014-01-15 DIAGNOSIS — R7309 Other abnormal glucose: Secondary | ICD-10-CM | POA: Insufficient documentation

## 2014-01-15 DIAGNOSIS — Z903 Acquired absence of stomach [part of]: Secondary | ICD-10-CM | POA: Insufficient documentation

## 2014-01-15 HISTORY — DX: Gastro-esophageal reflux disease without esophagitis: K21.9

## 2014-01-15 HISTORY — PX: PORTACATH PLACEMENT: SHX2246

## 2014-01-15 SURGERY — INSERTION, TUNNELED CENTRAL VENOUS DEVICE, WITH PORT
Anesthesia: General | Site: Chest

## 2014-01-15 MED ORDER — ONDANSETRON HCL 4 MG/2ML IJ SOLN
INTRAMUSCULAR | Status: DC | PRN
  Administered 2014-01-15: 4 mg via INTRAVENOUS

## 2014-01-15 MED ORDER — EPHEDRINE SULFATE 50 MG/ML IJ SOLN
INTRAMUSCULAR | Status: DC | PRN
  Administered 2014-01-15: 5 mg via INTRAVENOUS

## 2014-01-15 MED ORDER — ONDANSETRON HCL 4 MG/2ML IJ SOLN: 4.0000 mg | Freq: Four times a day (QID) | INTRAMUSCULAR | Status: DC | PRN

## 2014-01-15 MED ORDER — HEPARIN SOD (PORK) LOCK FLUSH 100 UNIT/ML IV SOLN
INTRAVENOUS | Status: AC
  Filled 2014-01-15: qty 5

## 2014-01-15 MED ORDER — SODIUM CHLORIDE 0.9 % IV SOLN: 250.0000 mL | INTRAVENOUS | Status: DC | PRN

## 2014-01-15 MED ORDER — EPHEDRINE SULFATE 50 MG/ML IJ SOLN
INTRAMUSCULAR | Status: AC
  Filled 2014-01-15: qty 1

## 2014-01-15 MED ORDER — LIDOCAINE-EPINEPHRINE 1 %-1:100000 IJ SOLN
INTRAMUSCULAR | Status: AC
  Filled 2014-01-15: qty 1

## 2014-01-15 MED ORDER — SODIUM CHLORIDE 0.9 % IJ SOLN
INTRAMUSCULAR | Status: AC
  Filled 2014-01-15: qty 10

## 2014-01-15 MED ORDER — CHLORHEXIDINE GLUCONATE 4 % EX LIQD: 1.0000 "application " | Freq: Once | CUTANEOUS | Status: DC

## 2014-01-15 MED ORDER — BUPIVACAINE HCL (PF) 0.25 % IJ SOLN
INTRAMUSCULAR | Status: AC
  Filled 2014-01-15: qty 30

## 2014-01-15 MED ORDER — LIDOCAINE-EPINEPHRINE (PF) 1 %-1:200000 IJ SOLN
INTRAMUSCULAR | Status: DC | PRN
  Administered 2014-01-15: 10 mL

## 2014-01-15 MED ORDER — PROPOFOL 10 MG/ML IV BOLUS
INTRAVENOUS | Status: AC
  Filled 2014-01-15: qty 20

## 2014-01-15 MED ORDER — ACETAMINOPHEN 325 MG PO TABS: 650.0000 mg | ORAL_TABLET | ORAL | Status: DC | PRN

## 2014-01-15 MED ORDER — PROPOFOL 10 MG/ML IV BOLUS
INTRAVENOUS | Status: DC | PRN
  Administered 2014-01-15: 160 mg via INTRAVENOUS

## 2014-01-15 MED ORDER — LACTATED RINGERS IV SOLN
INTRAVENOUS | Status: DC | PRN
  Administered 2014-01-15: 07:00:00 via INTRAVENOUS

## 2014-01-15 MED ORDER — OXYCODONE HCL 5 MG PO TABS: 5.0000 mg | ORAL_TABLET | ORAL | Status: DC | PRN

## 2014-01-15 MED ORDER — FENTANYL CITRATE 0.05 MG/ML IJ SOLN
INTRAMUSCULAR | Status: DC | PRN
  Administered 2014-01-15 (×2): 50 ug via INTRAVENOUS

## 2014-01-15 MED ORDER — LIDOCAINE HCL (CARDIAC) 20 MG/ML IV SOLN
INTRAVENOUS | Status: AC
  Filled 2014-01-15: qty 5

## 2014-01-15 MED ORDER — ONDANSETRON HCL 4 MG/2ML IJ SOLN
INTRAMUSCULAR | Status: AC
  Filled 2014-01-15: qty 2

## 2014-01-15 MED ORDER — DIPHENHYDRAMINE HCL 50 MG/ML IJ SOLN
INTRAMUSCULAR | Status: AC
  Filled 2014-01-15: qty 1

## 2014-01-15 MED ORDER — ACETAMINOPHEN 650 MG RE SUPP
650.0000 mg | RECTAL | Status: DC | PRN
  Filled 2014-01-15: qty 1

## 2014-01-15 MED ORDER — MIDAZOLAM HCL 2 MG/2ML IJ SOLN
INTRAMUSCULAR | Status: AC
  Filled 2014-01-15: qty 2

## 2014-01-15 MED ORDER — CLINDAMYCIN PHOSPHATE 600 MG/50ML IV SOLN
600.0000 mg | Freq: Once | INTRAVENOUS | Status: AC
  Administered 2014-01-15: 600 mg via INTRAVENOUS
  Filled 2014-01-15: qty 50

## 2014-01-15 MED ORDER — FENTANYL CITRATE 0.05 MG/ML IJ SOLN
INTRAMUSCULAR | Status: AC
  Filled 2014-01-15: qty 2

## 2014-01-15 MED ORDER — 0.9 % SODIUM CHLORIDE (POUR BTL) OPTIME
TOPICAL | Status: DC | PRN
  Administered 2014-01-15: 1000 mL

## 2014-01-15 MED ORDER — LACTATED RINGERS IV SOLN: INTRAVENOUS | Status: DC

## 2014-01-15 MED ORDER — SODIUM CHLORIDE 0.9 % IJ SOLN: 3.0000 mL | INTRAMUSCULAR | Status: DC | PRN

## 2014-01-15 MED ORDER — CIPROFLOXACIN IN D5W 400 MG/200ML IV SOLN: INTRAVENOUS | Status: DC | PRN

## 2014-01-15 MED ORDER — CIPROFLOXACIN IN D5W 400 MG/200ML IV SOLN
INTRAVENOUS | Status: DC | PRN
  Administered 2014-01-15: 400 mg via INTRAVENOUS

## 2014-01-15 MED ORDER — CIPROFLOXACIN IN D5W 400 MG/200ML IV SOLN
INTRAVENOUS | Status: AC
  Filled 2014-01-15: qty 200

## 2014-01-15 MED ORDER — DIPHENHYDRAMINE HCL 50 MG/ML IJ SOLN
INTRAMUSCULAR | Status: DC | PRN
  Administered 2014-01-15: 25 mg via INTRAVENOUS

## 2014-01-15 MED ORDER — SODIUM CHLORIDE 0.9 % IR SOLN
Freq: Once | Status: AC
  Administered 2014-01-15: 08:00:00
  Filled 2014-01-15: qty 1.2

## 2014-01-15 MED ORDER — SODIUM CHLORIDE 0.9 % IJ SOLN: 3.0000 mL | Freq: Two times a day (BID) | INTRAMUSCULAR | Status: DC

## 2014-01-15 MED ORDER — MIDAZOLAM HCL 5 MG/5ML IJ SOLN
INTRAMUSCULAR | Status: DC | PRN
  Administered 2014-01-15: 2 mg via INTRAVENOUS

## 2014-01-15 MED ORDER — LIDOCAINE HCL (CARDIAC) 20 MG/ML IV SOLN
INTRAVENOUS | Status: DC | PRN
  Administered 2014-01-15: 50 mg via INTRAVENOUS

## 2014-01-15 MED ORDER — ALPRAZOLAM 0.5 MG PO TABS: 0.5000 mg | ORAL_TABLET | Freq: Two times a day (BID) | ORAL | Status: DC | PRN

## 2014-01-15 MED ORDER — BUPIVACAINE HCL (PF) 0.25 % IJ SOLN
INTRAMUSCULAR | Status: DC | PRN
  Administered 2014-01-15: 10 mL

## 2014-01-15 MED ORDER — FENTANYL CITRATE 0.05 MG/ML IJ SOLN: 25.0000 ug | INTRAMUSCULAR | Status: DC | PRN

## 2014-01-15 MED ORDER — OXYCODONE-ACETAMINOPHEN 5-325 MG PO TABS: 1.0000 | ORAL_TABLET | ORAL | Status: DC | PRN

## 2014-01-15 SURGICAL SUPPLY — 38 items
BLADE HEX COATED 2.75 (ELECTRODE) ×3 IMPLANT
BLADE SURG 15 STRL LF DISP TIS (BLADE) ×1 IMPLANT
BLADE SURG 15 STRL SS (BLADE) ×2
BLADE SURG SZ11 CARB STEEL (BLADE) ×3 IMPLANT
CANISTER SUCTION 2500CC (MISCELLANEOUS) ×3 IMPLANT
CHLORAPREP W/TINT 26ML (MISCELLANEOUS) ×3 IMPLANT
DECANTER SPIKE VIAL GLASS SM (MISCELLANEOUS) ×3 IMPLANT
DERMABOND ADVANCED (GAUZE/BANDAGES/DRESSINGS) ×2
DERMABOND ADVANCED .7 DNX12 (GAUZE/BANDAGES/DRESSINGS) ×1 IMPLANT
DRAPE C-ARM 42X120 X-RAY (DRAPES) ×3 IMPLANT
DRAPE PED LAPAROTOMY (DRAPES) ×3 IMPLANT
DRAPE UTILITY XL STRL (DRAPES) ×3 IMPLANT
ELECT REM PT RETURN 9FT ADLT (ELECTROSURGICAL) ×3
ELECTRODE REM PT RTRN 9FT ADLT (ELECTROSURGICAL) ×1 IMPLANT
GAUZE SPONGE 4X4 16PLY XRAY LF (GAUZE/BANDAGES/DRESSINGS) ×3 IMPLANT
GLOVE BIO SURGEON STRL SZ 6 (GLOVE) ×6 IMPLANT
GLOVE BIOGEL PI IND STRL 6.5 (GLOVE) ×2 IMPLANT
GLOVE BIOGEL PI IND STRL 7.0 (GLOVE) ×2 IMPLANT
GLOVE BIOGEL PI INDICATOR 6.5 (GLOVE) ×4
GLOVE BIOGEL PI INDICATOR 7.0 (GLOVE) ×4
GLOVE INDICATOR 6.5 STRL GRN (GLOVE) ×6 IMPLANT
GOWN STRL REUS W/TWL 2XL LVL3 (GOWN DISPOSABLE) ×3 IMPLANT
KIT BASIN OR (CUSTOM PROCEDURE TRAY) ×3 IMPLANT
KIT PORT POWER 8FR ISP CVUE (Catheter) ×3 IMPLANT
NEEDLE HYPO 22GX1.5 SAFETY (NEEDLE) ×3 IMPLANT
NEEDLE HYPO 25X1 1.5 SAFETY (NEEDLE) IMPLANT
PACK UNIVERSAL I (CUSTOM PROCEDURE TRAY) ×3 IMPLANT
PENCIL BUTTON HOLSTER BLD 10FT (ELECTRODE) ×3 IMPLANT
SUT MNCRL AB 4-0 PS2 18 (SUTURE) ×3 IMPLANT
SUT PROLENE 2 0 SH DA (SUTURE) ×6 IMPLANT
SUT VIC AB 3-0 SH 27 (SUTURE) ×4
SUT VIC AB 3-0 SH 27X BRD (SUTURE) ×1 IMPLANT
SUT VIC AB 3-0 SH 27XBRD (SUTURE) ×1 IMPLANT
SYR BULB IRRIGATION 50ML (SYRINGE) IMPLANT
SYR CONTROL 10ML LL (SYRINGE) ×3 IMPLANT
SYRINGE 10CC LL (SYRINGE) ×3 IMPLANT
TOWEL OR 17X26 10 PK STRL BLUE (TOWEL DISPOSABLE) ×3 IMPLANT
YANKAUER SUCT BULB TIP 10FT TU (MISCELLANEOUS) IMPLANT

## 2014-01-15 NOTE — H&P (View-Only) (Signed)
HISTORY: Pt is around 2 weeks s/p open distal pancreatectomy/splenectomy/partial gastrectomy/cholecystectomy for chronic cholecystitis, pancreatic cancer, panc pseudocyst, and splenic infarct.  She is doing well.  She is eating OK.  She is not having fevers or chills.  She denies nausea or vomiting.  She is not having severe diarrhea.  She always has some loose stools for her baseline colitis, but this is controlled.  She had taken her blood sugars twice daily, and these have been between 85 and 137.  Most are around 100-110.      EXAM: General:  Alert and oriented.   Incision:  Healing well.  Steri strips removed.    PATHOLOGY: Diagnosis 1. Pancreas, resection, Distal - INVASIVE DUCTAL ADENOCARCINOMA, MODERATELY DIFFERENTIATED. - PANCREATIC RESECTION MARGINS (ORIGINAL AND FINAL) ARE POSITIVE FOR CARCINOMA. - PERINEURAL INVASION PRESENT. - NO LYMPHOVASCULAR INVASION IDENTIFIED. - BACKGROUND ACUTE HEMORRHAGIC AND CHRONIC PANCREATITIS. - TWELVE OF TWELVE LYMPH NODES NEGATIVE FOR CARCINOMA (0/12). 2. Gallbladder - CHRONIC CHOLECYSTITIS WITH CHOLELITHIASIS. - CHOLESTEROLOSIS. - NO MALIGNANCY IDENTIFIED. 3. Spleen, Spleen and small fragment of distal pancreas - BENIGN SPLEEN WITH ADHERENT STOMACH AND PANCREAS. - STOMACH WITH MILD CHRONIC GASTRITIS. - PANCREAS WITH ACUTE HEMORRHAGIC AND CHRONIC PANCREATITIS WITH PSEUDOCYST AND ABCESS FORMATION. - FIVE OF FIVE LYMPH NODES NEGATIVE FOR CARCINOMA (0/5). - NO MALIGNANCY IDENTIFIED. 4. Pancreas, resection - ADENOCARCINOMA, INVOLVING FINAL RESECTION MARGIN. - BACKGROUND ACUTE AND CHRONIC PANCREATITIS. - ONE OF ONE LYMPH NODES NEGATIVE FOR CARCINOMA (0/1). 5. Lymph node, biopsy - ONE OF ONE LYMPH NODES NEGATIVE FOR CARCINOMA (0/1).   ASSESSMENT AND PLAN:   Carcinoma of pancreas Will follow up with Dr. Jana Hakim and Dr. Tammi Klippel regarding adjuvant treatment.  Dr. Jana Hakim will let me know about need for port a cath.  I will see her back  in 6-8 weeks.    Pt clinically well.    Peripheral edema Appears resolved.  We will stop lasix and potassium.  She will follow up with Dr. Renold Genta if this recurs.    Pancreatic exocrine insufficiency - taking creon, controlled symptoms.  Hyperglycemia - improved.  Will follow up this with PCP if needed.  Going to once daily FSG as long as those are OK.      Milus Height, MD Surgical Oncology, [facility]  [name], MD [name], MD

## 2014-01-15 NOTE — Anesthesia Preprocedure Evaluation (Addendum)
Anesthesia Evaluation  Patient identified by MRN, date of birth, ID band Patient awake    Reviewed: Allergy & Precautions, H&P , NPO status , Patient's Chart, lab work & pertinent test results  Airway Mallampati: II TM Distance: >3 FB Neck ROM: full    Dental no notable dental hx.    Pulmonary neg pulmonary ROS, former smoker,  breath sounds clear to auscultation  Pulmonary exam normal       Cardiovascular Exercise Tolerance: Good hypertension, negative cardio ROS  Rhythm:regular Rate:Normal     Neuro/Psych negative neurological ROS  negative psych ROS   GI/Hepatic negative GI ROS, Neg liver ROS, GERD-  Medicated and Controlled,pancreatitis   Endo/Other  negative endocrine ROS  Renal/GU negative Renal ROS  negative genitourinary   Musculoskeletal   Abdominal   Peds  Hematology negative hematology ROS (+) Clotting disorder   Anesthesia Other Findings   Reproductive/Obstetrics negative OB ROS                         Anesthesia Physical Anesthesia Plan  ASA: III  Anesthesia Plan: General   Post-op Pain Management:    Induction: Intravenous  Airway Management Planned: LMA  Additional Equipment:   Intra-op Plan:   Post-operative Plan:   Informed Consent: I have reviewed the patients History and Physical, chart, labs and discussed the procedure including the risks, benefits and alternatives for the proposed anesthesia with the patient or authorized representative who has indicated his/her understanding and acceptance.   Dental Advisory Given  Plan Discussed with: CRNA and Surgeon  Anesthesia Plan Comments:         Anesthesia Quick Evaluation

## 2014-01-15 NOTE — Transfer of Care (Signed)
Immediate Anesthesia Transfer of Care Note  Patient: Annette Beltran  Procedure(s) Performed: Procedure(s): INSERTION PORT-A-CATH (N/A)  Patient Location: PACU  Anesthesia Type:General  Level of Consciousness: awake, alert  and oriented  Airway & Oxygen Therapy: Patient Spontanous Breathing and Patient connected to face mask oxygen  Post-op Assessment: Report given to PACU RN and Post -op Vital signs reviewed and stable  Post vital signs: Reviewed and stable  Complications: No apparent anesthesia complications

## 2014-01-15 NOTE — Discharge Instructions (Signed)
[place] Office Phone Number [phone]   POST OP INSTRUCTIONS  Always review your discharge instruction sheet given to you by the facility where your surgery was performed.  IF YOU HAVE DISABILITY OR FAMILY LEAVE FORMS, YOU MUST BRING THEM TO THE OFFICE FOR PROCESSING.  DO NOT GIVE THEM TO YOUR DOCTOR.  1. A prescription for pain medication may be given to you upon discharge.  Take your pain medication as prescribed, if needed.  If narcotic pain medicine is not needed, then you may take acetaminophen (Tylenol) or ibuprofen (Advil) as needed. 2. Take your usually prescribed medications unless otherwise directed 3. If you need a refill on your pain medication, please contact your pharmacy.  They will contact our office to request authorization.  Prescriptions will not be filled after 5pm or on week-ends. 4. You should eat very light the first 24 hours after surgery, such as soup, crackers, pudding, etc.  Resume your normal diet the day after surgery 5. It is common to experience some constipation if taking pain medication after surgery.  Increasing fluid intake and taking a stool softener will usually help or prevent this problem from occurring.  A mild laxative (Milk of Magnesia or Miralax) should be taken according to package directions if there are no bowel movements after 48 hours. 6. You may shower in 48 hours.  The surgical glue will flake off in 2-3 weeks.   7. ACTIVITIES:  No strenuous activity or heavy lifting for 1 week.   a. You may drive when you no longer are taking prescription pain medication, you can comfortably wear a seatbelt, and you can safely maneuver your car and apply brakes. b. RETURN TO WORK:  __________next week_______________ Dennis Bast should see your doctor in the office for a follow-up appointment approximately three-four weeks after your surgery.    WHEN TO CALL YOUR DOCTOR: 1. Fever over 101.0 2. Nausea and/or vomiting. 3. Extreme swelling or  bruising. 4. Continued bleeding from incision. 5. Increased pain, redness, or drainage from the incision.  The clinic staff is available to answer your questions during regular business hours.  Please dont hesitate to call and ask to speak to one of the nurses for clinical concerns.  If you have a medical emergency, go to the nearest emergency room or call 911.  A surgeon from [facility] is always on call at the hospital.  For further questions, please visit [email or web address]

## 2014-01-15 NOTE — Op Note (Signed)
PREOPERATIVE DIAGNOSIS:  Pancreatic cancer     POSTOPERATIVE DIAGNOSIS:  Same     PROCEDURE: Left subclavian port placement, Bard ClearVue  Power Port, MRI safe, 8-French.      SURGEON:  [name], MD      ANESTHESIA:  General   FINDINGS:  Good venous return, easy flush, and tip of the catheter and   SVC 20 cm.      SPECIMEN:  None.      ESTIMATED BLOOD LOSS:  Minimal.      COMPLICATIONS:  None known.      PROCEDURE:  Pt was identified in the holding area and taken to   the operating room, where patient was placed supine on the operating room   table.  Gen anesthesia was induced.  Patient's arms were tucked and the upper   chest and neck were prepped and draped in sterile fashion.  Time-out was   performed according to the surgical safety check list.  When all was   correct, we continued.   Local anesthetic was administered over this   area at the angle of the clavicle.  The vein was accessed with 4 passes of the needle. There was good venous return and the wire passed easily with no ectopy.   Fluoroscopy was used to confirm that the wire was in the vena cava.      The patient was placed back level and the area for the pocket was anethetized   with local anesthetic.  A 3-cm transverse incision was made with a #15   blade.  Cautery was used to divide the subcutaneous tissues down to the   pectoralis muscle.  An [employer] retractor was used to elevate the skin   while a pocket was created on top of the pectoralis fascia.  The port   was placed into the pocket to confirm that it was of adequate size.  The   catheter was preattached to the port.  The port was then secured to the   pectoralis fascia with four 2-0 Prolene sutures.  These were clamped and   not tied down yet.    The catheter was tunneled through to the wire exit   site.  The catheter was placed along the wire to determine what length it should be to be in the SVC.  The catheter was cut at 17 cm.  The tunneler  sheath and dilator were passed over the wire and the dilator and wire were removed.  The catheter was advanced through the tunneler sheath and the tunneler sheath was pulled away.  Care was taken to keep the catheter in the tunneler sheath as this occurred. This was advanced and the tunneler sheath was removed.  There was good venous   return and easy flush of the catheter, however, the catheter was not in the svc, it was in the innominate vein.  The wire was replaced, and the remaining catheter was used.  This was cut to be 20 cm in length, with the ends being at 26 and 46 cm.  The Prolene sutures were tied   down to the pectoral fascia.  The skin was reapproximated using 3-0   Vicryl interrupted deep dermal sutures.    Fluoroscopy was used to re-confirm good position of the catheter.  The skin   was then closed using 4-0 Monocryl in a subcuticular fashion.  The port was flushed with concentrated heparin flush as well.  The wounds were then cleaned, dried, and dressed with  Dermabond.  The patient was awakened from anesthesia and taken to the PACU in stable condition.  Needle, sponge, and instrument counts were correct.               [name], MD

## 2014-01-15 NOTE — Progress Notes (Signed)
PACU note-----portable chest xray done per order

## 2014-01-15 NOTE — Anesthesia Postprocedure Evaluation (Signed)
  Anesthesia Post-op Note  Patient: Annette Beltran  Procedure(s) Performed: Procedure(s) (LRB): INSERTION PORT-A-CATH (N/A)  Patient Location: PACU  Anesthesia Type: General  Level of Consciousness: awake and alert   Airway and Oxygen Therapy: Patient Spontanous Breathing  Post-op Pain: mild  Post-op Assessment: Post-op Vital signs reviewed, Patient's Cardiovascular Status Stable, Respiratory Function Stable, Patent Airway and No signs of Nausea or vomiting  Last Vitals:  Filed Vitals:   01/15/14 0945  BP: 113/46  Pulse: 58  Temp: 36.3 C  Resp: 16    Post-op Vital Signs: stable   Complications: No apparent anesthesia complications

## 2014-01-15 NOTE — Preoperative (Signed)
Beta Blockers   Reason not to administer Beta Blockers:Not Applicable 

## 2014-01-15 NOTE — Interval H&P Note (Signed)
History and Physical Interval Note:  01/15/2014 7:14 AM  Annette Beltran  has presented today for surgery, with the diagnosis of pancreatic cancer  The various methods of treatment have been discussed with the patient and family. After consideration of risks, benefits and other options for treatment, the patient has consented to  Procedure(s): INSERTION PORT-A-CATH (N/A) as a surgical intervention .  The patient's history has been reviewed, patient examined, no change in status, stable for surgery.  I have reviewed the patient's chart and labs.  Questions were answered to the patient's satisfaction.     Tita Terhaar

## 2014-01-18 ENCOUNTER — Encounter: Payer: Self-pay | Admitting: *Deleted

## 2014-01-18 NOTE — Progress Notes (Signed)
RECEIVED A FAX FROM [facility] CONCERNING A CONFIRMATION OF PRESCRIPTION SHIPMENT FOR CAPECITABINE ON 01/17/14.

## 2014-01-25 ENCOUNTER — Other Ambulatory Visit: Payer: Self-pay | Admitting: *Deleted

## 2014-01-25 MED ORDER — ZOLPIDEM TARTRATE 5 MG PO TABS: 5.0000 mg | ORAL_TABLET | Freq: Every evening | ORAL | Status: DC | PRN

## 2014-01-25 NOTE — Telephone Encounter (Signed)
Pt called to this RN stating " I am not sleeping ".  Per discussion she states she has been using xanax and benadryl with little benefit " and then I even used them together but that only worked for a short time ".  This RN inquired further regarding sleep disturbances and contributing factors with pt stating " I am physically exhausted but when I lay down I cannot stop thinking about my upcoming chemotherapy"  Annette Beltran also states she used [place]- " a friend uses it and let me try it " with good outcome of 7 hours of sleep before waking.  Above reviewed with MD and prescription for low dose ambien called to her pharmacy.

## 2014-01-26 ENCOUNTER — Telehealth: Payer: Self-pay | Admitting: Dietician

## 2014-01-26 NOTE — Telephone Encounter (Signed)
Brief Outpatient Oncology Nutrition Note  Patient has been identified to be at risk on malnutrition screen.  Wt Readings from Last 10 Encounters:  01/15/14 147 lb 8 oz (66.906 kg)  01/15/14 147 lb 8 oz (66.906 kg)  01/10/14 148 lb 11.2 oz (67.45 kg)  01/08/14 152 lb 8 oz (69.174 kg)  01/05/14 148 lb (67.132 kg)  12/22/13 167 lb 1.7 oz (75.8 kg)  12/22/13 167 lb 1.7 oz (75.8 kg)  12/14/13 156 lb (70.761 kg)  12/04/13 156 lb 3.2 oz (70.852 kg)  11/30/13 158 lb 3.2 oz (71.759 kg)    Dx:  Pancreatic Cancer.  Last seen by the [place] [facility] 2 months ago and to be followed with chemo. Patient was seen by the inpatient [place] [facility] 2/24 after surgery-cholecystectomy, open extended distal pancreatectomy, splenectomy, partial gastrectomy x2.  Called patient today due to weight loss.  Patient with a 20 lb weight loss since in the past month.  Patient reports that she is now eating well except for today secondary to nausea.  Drinks [employer] and has purchased a case of this.  Encouraged adequate intake for weight maintenance and continuing with supplementation.  Patient to start chemo next week.  [place] [facility] to follow.  Antonieta Iba, RD, LDN

## 2014-01-29 ENCOUNTER — Telehealth: Payer: Self-pay | Admitting: Genetic Counselor

## 2014-01-29 ENCOUNTER — Encounter: Payer: Self-pay | Admitting: Genetic Counselor

## 2014-01-29 NOTE — Telephone Encounter (Signed)
Left good news message on vm.

## 2014-01-29 NOTE — Telephone Encounter (Signed)
Revealed negative genetic testing on comprehensive cancer panel

## 2014-01-30 ENCOUNTER — Other Ambulatory Visit: Payer: Medicare Other

## 2014-01-30 ENCOUNTER — Other Ambulatory Visit: Payer: Self-pay | Admitting: *Deleted

## 2014-01-30 ENCOUNTER — Encounter: Payer: Self-pay | Admitting: *Deleted

## 2014-01-30 DIAGNOSIS — C50911 Malignant neoplasm of unspecified site of right female breast: Secondary | ICD-10-CM

## 2014-01-30 MED ORDER — LIDOCAINE-PRILOCAINE 2.5-2.5 % EX CREA: TOPICAL_CREAM | CUTANEOUS | Status: DC

## 2014-01-31 ENCOUNTER — Other Ambulatory Visit: Payer: Self-pay

## 2014-02-01 ENCOUNTER — Ambulatory Visit (HOSPITAL_BASED_OUTPATIENT_CLINIC_OR_DEPARTMENT_OTHER): Payer: Medicare Other

## 2014-02-01 ENCOUNTER — Other Ambulatory Visit (HOSPITAL_BASED_OUTPATIENT_CLINIC_OR_DEPARTMENT_OTHER): Payer: Medicare Other

## 2014-02-01 ENCOUNTER — Ambulatory Visit (HOSPITAL_BASED_OUTPATIENT_CLINIC_OR_DEPARTMENT_OTHER): Payer: Medicare Other | Admitting: Oncology

## 2014-02-01 VITALS — BP 147/71 | HR 53 | Temp 97.6°F | Resp 18 | Ht 65.0 in | Wt 148.4 lb

## 2014-02-01 DIAGNOSIS — C251 Malignant neoplasm of body of pancreas: Secondary | ICD-10-CM

## 2014-02-01 DIAGNOSIS — K52831 Collagenous colitis: Secondary | ICD-10-CM

## 2014-02-01 DIAGNOSIS — C259 Malignant neoplasm of pancreas, unspecified: Secondary | ICD-10-CM

## 2014-02-01 DIAGNOSIS — Z853 Personal history of malignant neoplasm of breast: Secondary | ICD-10-CM

## 2014-02-01 DIAGNOSIS — Z452 Encounter for adjustment and management of vascular access device: Secondary | ICD-10-CM

## 2014-02-01 DIAGNOSIS — M8580 Other specified disorders of bone density and structure, unspecified site: Secondary | ICD-10-CM

## 2014-02-01 DIAGNOSIS — C50911 Malignant neoplasm of unspecified site of right female breast: Secondary | ICD-10-CM

## 2014-02-01 DIAGNOSIS — Z5111 Encounter for antineoplastic chemotherapy: Secondary | ICD-10-CM

## 2014-02-01 DIAGNOSIS — D7389 Other diseases of spleen: Secondary | ICD-10-CM

## 2014-02-01 DIAGNOSIS — D735 Infarction of spleen: Secondary | ICD-10-CM

## 2014-02-01 LAB — CBC WITH DIFFERENTIAL/PLATELET
BASO%: 1 % (ref 0.0–2.0)
Basophils Absolute: 0.1 10*3/uL (ref 0.0–0.1)
EOS%: 6.4 % (ref 0.0–7.0)
Eosinophils Absolute: 0.6 10*3/uL — ABNORMAL HIGH (ref 0.0–0.5)
HCT: 37.2 % (ref 34.8–46.6)
HEMOGLOBIN: 12.6 g/dL (ref 11.6–15.9)
LYMPH%: 19.1 % (ref 14.0–49.7)
MCH: 29 pg (ref 25.1–34.0)
MCHC: 34 g/dL (ref 31.5–36.0)
MCV: 85.4 fL (ref 79.5–101.0)
MONO#: 0.8 10*3/uL (ref 0.1–0.9)
MONO%: 9.4 % (ref 0.0–14.0)
NEUT#: 5.5 10*3/uL (ref 1.5–6.5)
NEUT%: 64.1 % (ref 38.4–76.8)
PLATELETS: 453 10*3/uL — AB (ref 145–400)
RBC: 4.35 10*6/uL (ref 3.70–5.45)
RDW: 16.4 % — AB (ref 11.2–14.5)
WBC: 8.6 10*3/uL (ref 3.9–10.3)
lymph#: 1.7 10*3/uL (ref 0.9–3.3)

## 2014-02-01 LAB — COMPREHENSIVE METABOLIC PANEL (CC13)
ALK PHOS: 79 U/L (ref 40–150)
ALT: 12 U/L (ref 0–55)
AST: 17 U/L (ref 5–34)
Albumin: 3.7 g/dL (ref 3.5–5.0)
Anion Gap: 11 mEq/L (ref 3–11)
BILIRUBIN TOTAL: 0.52 mg/dL (ref 0.20–1.20)
BUN: 15.5 mg/dL (ref 7.0–26.0)
CO2: 23 mEq/L (ref 22–29)
Calcium: 9.4 mg/dL (ref 8.4–10.4)
Chloride: 110 mEq/L — ABNORMAL HIGH (ref 98–109)
Creatinine: 0.7 mg/dL (ref 0.6–1.1)
Glucose: 119 mg/dl (ref 70–140)
Potassium: 3.7 mEq/L (ref 3.5–5.1)
SODIUM: 144 meq/L (ref 136–145)
TOTAL PROTEIN: 7 g/dL (ref 6.4–8.3)

## 2014-02-01 LAB — CANCER ANTIGEN 19-9: CA 19 9: 25.1 U/mL (ref <35.0)

## 2014-02-01 MED ORDER — PROCHLORPERAZINE MALEATE 10 MG PO TABS
10.0000 mg | ORAL_TABLET | Freq: Once | ORAL | Status: AC
  Administered 2014-02-01: 10 mg via ORAL

## 2014-02-01 MED ORDER — PROCHLORPERAZINE MALEATE 10 MG PO TABS
ORAL_TABLET | ORAL | Status: AC
  Filled 2014-02-01: qty 1

## 2014-02-01 MED ORDER — SODIUM CHLORIDE 0.9 % IV SOLN
Freq: Once | INTRAVENOUS | Status: AC
  Administered 2014-02-01: 11:00:00 via INTRAVENOUS

## 2014-02-01 MED ORDER — HEPARIN SOD (PORK) LOCK FLUSH 100 UNIT/ML IV SOLN
500.0000 [IU] | Freq: Once | INTRAVENOUS | Status: AC | PRN
  Administered 2014-02-01: 500 [IU]
  Filled 2014-02-01: qty 5

## 2014-02-01 MED ORDER — ALTEPLASE 2 MG IJ SOLR
2.0000 mg | Freq: Once | INTRAMUSCULAR | Status: AC | PRN
  Administered 2014-02-01: 2 mg
  Filled 2014-02-01: qty 2

## 2014-02-01 MED ORDER — SODIUM CHLORIDE 0.9 % IJ SOLN
10.0000 mL | INTRAMUSCULAR | Status: DC | PRN
  Administered 2014-02-01: 10 mL
  Filled 2014-02-01: qty 10

## 2014-02-01 MED ORDER — SODIUM CHLORIDE 0.9 % IV SOLN
1000.0000 mg/m2 | Freq: Once | INTRAVENOUS | Status: AC
  Administered 2014-02-01: 1748 mg via INTRAVENOUS
  Filled 2014-02-01: qty 45.97

## 2014-02-01 NOTE — Progress Notes (Signed)
[place]  [phone] [phone]     ID: CHIRSTINE DEFRAIN OB: [birth year]  MR#: [id number]  JYN#:[id number]  PCP: [name], MD GYN:   SU: [name] OTHER MD: Delfin Edis, [name]  CHIEF COMPLAINT: "I haven't felt well since September"  PANCREATIC CANCER HISTORY: I formerly followed Annette Beltran for a stage I breast cancer, which was treated with surgery, radiation, and anti-estrogens. She was released from followup here in 2007. In addition, in 2012 she had abdominal pain leading to hospitalization. 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 The patient also has a history of collagenous colitis, which of course carries its own set of symptoms, making identification of her new attic problem more difficult.  [name] tells me she started "feeling bad" in September of 2014. There was abdominal discomfort localizing to the left upper o'clock on, worse with inspiration. CT angiography 08/21/2013 showed no clot and no significant abnormalities. Plain rib and left shoulder views were negative. CT abdomen and pelvis with contrast 08/22/2013 showed slight prominence of the pancreatic duct in the distal body and tail with a new small structure emanating from the tail of the pancreas consistent with a pseudocyst. Exudate near the tail of the pancreas suggested pancreatitis. MRI of the abdomen 09/03/2013 confirmed several ill-defined fluid collections between the gastric fundus and the splenic hilum surrounding the pancreatic tail. There was no evidence of a pancreatic mass. The pancreatic tail did enhanced following contrast. The splenic vein appeared chronically thrombosed with a small amount of nonocclusive thrombus extending into the main and left portal  veins.  On 09/26/2013 amylase was 237 and lipase 513.0, consistent with acute pancreatitis. These numbers subsequently dropped in on 11/28/2013 amylase was normal at 47 and lipase was mildly elevated at 110  With continuing left-sided abdominal discomfort, abdominal ultrasound 11/13/2013 showed multiple gallstones with no evidence of cholecystitis. Evaluation of the pancreas showed a hypoechoic lobulated focus measuring up to 7.4 cm felt to be nonspecific. Repeat abdominal CT 11/14/2013 showed diffuse pancreatic atrophy without evidence of a mass, again consistent with chronic pancreatitis. There was mild soft tissue stranding suspicious for mild superimposed acute pancreatitis. A new rim-enhancing fluid collection was noted in the gastrosplenic ligament measuring 4.5 cm, consistent with a pseudocyst. Finally on 11/23/2013, endoscopic ultrasonography under Oretha Caprice showed a subtle 2.5 cm soft tissue mass in the mid-pancreas, abutting the splenic vein. This was biopsied x2, with cytology (NZB [id number]) showing adenocarcinoma. A peripancreatic fluid collection measuring over 5 cm was aspirated yielding 25 cc of chocolate colored fluid. Two or three scattered round hypoechoic lymph nodes were noted near the pancreatic tail but were not sampled.  In addition, on 11/28/2013 a chest x-ray obtained to evaluate upper respiratory symptoms showed a small to moderate left effusion. Chest CT scan 11/29/2013 confirmed a moderate left-sided pleural effusion with no suspicious appearing pulmonary nodules or masses associated with it. The complex cystic lesion associated with the tail of the pancreas was again noted, measuring 5.5 cm despite the recent aspiration procedure.  The patient's subsequent history is as detailed below.  INTERVAL HISTORY: Annette Beltran returns todayfor followup of her pancreatic cancer accompanied by her husband [name]. Since her last visit here she had her port placed. She underwent that without event.  She is now ready to start chemotherapy,  today being day 1 cycle 1 of gemcitabine  REVIEW OF SYSTEMS: Chaela is having problems sleeping. The Ambien made her dizzy but otherwise did not work. She went back to Xanax and Benadryl but is needing higher doses of Xanax. I suggested she go to lorazepam and Benadryl and try to leave the Xanax behind. She is walking 15-20 minutes a day and I have encouraged her to increase that by going to the gym daily and perhaps using the stationary bike if the weather does not allow her to work outside. She has had minimal mouth sores, no diarrhea at present beyond her usual colitis issues, no fever, rash, or bleeding. She has a slightly macular area in her left lower cheek which she would like Dr. Ronnald Ramp to scrape off I have no problems with that. A detailed review of systems today was otherwise noncontributory  PAST MEDICAL HISTORY: Past Medical History  Diagnosis Date  . Macular degeneration   . Fluid retention   . Colitis, collagenous   . Vitamin D deficiency   . Osteopenia   . Celiac artery stenosis   . Splenic infarction   . Arthritis   . History of blood clots   . Anxiety   . Shingles   . Pancreatitis   . Osteoporosis   . Clotting disorder   . Breast cancer     rt lumpectomy  . Arrhythmia     "skips a beat"  [place]  heart  . GERD (gastroesophageal reflux disease)     PAST SURGICAL HISTORY: Past Surgical History  Procedure Laterality Date  . Foot surgery  2003    x 3, 2 on right, 1 on left  . Breast lumpectomy Right     radiation for 6 weeks  . Meniscus repair Right   . Eus N/A 11/23/2013    Procedure: UPPER ENDOSCOPIC ULTRASOUND (EUS) LINEAR;  Surgeon: Milus Banister, MD;  Location: [facility];  Service: Endoscopy;  Laterality: N/A;  . Tonsillectomy    . Laparoscopy N/A 12/20/2013    Procedure: LAPAROSCOPY DIAGNOSTIC ;  Surgeon: [name], MD;  Location: [facility];  Service: General;  Laterality: N/A;  . Cholecystectomy N/A 12/20/2013     Procedure: LAPAROSCOPIC CHOLECYSTECTOMY;  Surgeon: [name], MD;  Location: [place];  Service: General;  Laterality: N/A;  . Splenectomy, total N/A 12/20/2013    Procedure: SPLENECTOMY;  Surgeon: [name], MD;  Location: [place];  Service: General;  Laterality: N/A;  . Partial gastrectomy N/A 12/20/2013    Procedure: PARTIAL GASTRECTOMY;  Surgeon: [name], MD;  Location: [place];  Service: General;  Laterality: N/A;  . Portacath placement N/A 01/15/2014    Procedure: INSERTION PORT-A-CATH;  Surgeon: [name], MD;  Location: [facility];  Service: General;  Laterality: N/A;    FAMILY HISTORY Family History  Problem Relation Age of Onset  . Colon cancer Neg Hx   . Aneurysm Mother   . Stroke Mother   . Prostate cancer Father 78  . Skin cancer Brother 65    treated with interferon for 1 year  . Heart failure Paternal Aunt   . Breast cancer Paternal Aunt     dx over 55s  . Pancreatic cancer Maternal Grandmother     dx in her 35s  . Breast cancer Maternal Aunt     dx over 50  . Rheum arthritis Brother   . Breast cancer Paternal Aunt     dx over 19s  . Leukemia Paternal Aunt  the patient's father had a diagnosis of prostate cancer metastatic to the liver or a died at age 10. The patient's mother died secondary to carotid third brain aneurysm at the age of 40. The patient had 2 brothers, no sisters. One brother has a history of melanoma. The patient's mother is mother was diagnosed with pancreatic cancer in her 78s. There is a history of breast cancer and second degree relatives, none before the age of 57  GYNECOLOGIC HISTORY:  Menarche age 52, first live birth age 68, the patient is [place] P2.. Stopped in her early 12s. 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 Her husband of 52 years, [name], retired from [employer] and currently works part-time as a Cabin crew  with [employer]. Daughter Marita [place] teaches first grade. Daughter Leafy Ro is a homemaker. The patient has 4 grandchildren [name] has an additional 5 grandchildren of his own). The patient attends a [employer]    ADVANCED DIRECTIVES: In place   HEALTH MAINTENANCE: History  Substance Use Topics  . Smoking status: Former Smoker -- 0.50 packs/day for 28 years    Types: Cigarettes    Quit date: 11/02/2000  . Smokeless tobacco: Never Used  . Alcohol Use: Yes     Comment: occ wine      Colonoscopy:  PAP:  Bone density:  Lipid panel:  Mammography:  Allergies  Allergen Reactions  . Codeine Nausea And Vomiting  . Lactose Intolerance (Gi)   . Tequin     Severe stomach pain  . Penicillins Nausea Only and Rash    Current Outpatient Prescriptions  Medication Sig Dispense Refill  . ALPRAZolam (XANAX) 0.5 MG tablet Take 1 tablet (0.5 mg total) by mouth 2 (two) times daily as needed for anxiety.  60 tablet  5  . Ascorbic Acid (VITAMIN C PO) Take 1 tablet by mouth 2 (two) times daily.      Marland Kitchen aspirin 81 MG tablet Take 81 mg by mouth daily.      Marland Kitchen b complex vitamins tablet Take 1 tablet by mouth daily.      . capecitabine (XELODA) 500 MG tablet Take two tablets twice daily on radiation days  120 tablet  0  . diphenhydrAMINE (BENADRYL) 25 MG tablet Take 25 mg by mouth at bedtime.       Marland Kitchen esomeprazole (NEXIUM) 20 MG capsule Take 20 mg by mouth daily at 12 noon.      . feeding supplement, RESOURCE BREEZE, (RESOURCE BREEZE) LIQD Take 1 Container by mouth 2 (two) times daily between meals.  30 Container  3  . lidocaine-prilocaine (EMLA) cream Apply 1-2 hours to port a cath before procedure.  30 g  6  . lipase/protease/amylase (CREON-12/PANCREASE) 12000 UNITS CPEP capsule Take 2 capsules by mouth 3 (three) times daily with meals.  270 capsule  3  . loratadine (CLARITIN) 10 MG tablet Take 10 mg by mouth daily.      Marland Kitchen LORazepam (ATIVAN) 0.5 MG tablet Take 1 tablet (0.5 mg total) by mouth every  6 (six) hours as needed (Nausea or vomiting).  30 tablet  0  . naproxen sodium (ANAPROX) 220 MG tablet Take 220 mg by mouth 2 (two) times daily as needed (pain).       . ondansetron (ZOFRAN) 8 MG tablet Take 1 tablet (8 mg total) by mouth 2 (two) times daily as  needed (Nausea or vomiting).  30 tablet  1  . oxyCODONE-acetaminophen (PERCOCET/ROXICET) 5-325 MG per tablet Take 1-2 tablets by mouth every 4 (four) hours as needed (for pain).  20 tablet  0  . PARoxetine (PAXIL) 10 MG tablet Take 1 tablet (10 mg total) by mouth daily.  30 tablet  5  . prochlorperazine (COMPAZINE) 10 MG tablet Take 1 tablet (10 mg total) by mouth every 6 (six) hours as needed (Nausea or vomiting).  30 tablet  1  . zolpidem (AMBIEN) 5 MG tablet Take 1 tablet (5 mg total) by mouth at bedtime as needed for sleep.  30 tablet  1   No current facility-administered medications for this visit.    OBJECTIVE: Middle-aged white womanwho appears stated age  68 Vitals:   02/01/14 0829  BP: 147/71  Pulse: 53  Temp: 97.6 F (36.4 C)  Resp: 18     Body mass index is 24.7 kg/(m^2).    ECOG FS:0 - Asymptomatic  Ocular: Sclerae unicteric, pupils  equal and reactive  Ear-nose-throat: Oropharynx clear,  no lesions noted  Lymphatic: No cervical or supraclavicular adenopathy Lungs no rales or rhonchi, minimal dullness at the left base  Heart regular rate and rhythm, no murmur appreciated Abd  soft, nontender, positive bowel sounds, no masses palpated  MSK no focal spinal tenderness, no joint edema Neuro: non-focal, well-oriented, positive affect Breasts: Deferred Skin: The slightly macular lesion in her lower left cheek appears entirely benign. The port is in the upper left anterior chest wall, intact  LAB RESULTS:  CMP     Component Value Date/Time   NA 141 01/08/2014 1613   NA 143 12/26/2013 0620   K 4.0 01/08/2014 1613   K 3.7 12/26/2013 0620   CL 101 12/26/2013 0620   CO2 25 01/08/2014 1613   CO2 32 12/26/2013 0620    GLUCOSE 111 01/08/2014 1613   GLUCOSE 92 12/26/2013 0620   BUN 13.1 01/08/2014 1613   BUN 9 12/26/2013 0620   CREATININE 0.7 01/08/2014 1613   CREATININE 0.57 12/26/2013 0620   CREATININE 0.62 11/28/2013 1635   CALCIUM 9.7 01/08/2014 1613   CALCIUM 8.7 12/26/2013 0620   PROT 7.4 01/08/2014 1613   PROT 5.0* 12/21/2013 0450   ALBUMIN 3.9 01/08/2014 1613   ALBUMIN 2.3* 12/21/2013 0450   AST 17 01/08/2014 1613   AST 42* 12/21/2013 0450   ALT 14 01/08/2014 1613   ALT 25 12/21/2013 0450   ALKPHOS 93 01/08/2014 1613   ALKPHOS 46 12/21/2013 0450   BILITOT 0.25 01/08/2014 1613   BILITOT 0.7 12/21/2013 0450   GFRNONAA >90 12/26/2013 0620   GFRAA >90 12/26/2013 0620    I No results found for this basename: SPEP,  UPEP,   kappa and lambda light chains    Lab Results  Component Value Date   WBC 8.6 02/01/2014   NEUTROABS 5.5 02/01/2014   HGB 12.6 02/01/2014   HCT 37.2 02/01/2014   MCV 85.4 02/01/2014   PLT 453* 02/01/2014  Results for Annette Beltran, Annette Beltran (MRN [id number]) as of 01/11/2014 06:31  Ref. Range 11/29/2013 15:33 01/08/2014 16:13  CA 19-9 Latest Range: <35.0 U/mL 47.0 (H) 17.9      Chemistry      Component Value Date/Time   NA 141 01/08/2014 1613   NA 143 12/26/2013 0620   K 4.0 01/08/2014 1613   K 3.7 12/26/2013 0620   CL 101 12/26/2013 0620   CO2 25 01/08/2014 1613   CO2 32 12/26/2013 5003  BUN 13.1 01/08/2014 1613   BUN 9 12/26/2013 0620   CREATININE 0.7 01/08/2014 1613   CREATININE 0.57 12/26/2013 0620   CREATININE 0.62 11/28/2013 1635      Component Value Date/Time   CALCIUM 9.7 01/08/2014 1613   CALCIUM 8.7 12/26/2013 0620   ALKPHOS 93 01/08/2014 1613   ALKPHOS 46 12/21/2013 0450   AST 17 01/08/2014 1613   AST 42* 12/21/2013 0450   ALT 14 01/08/2014 1613   ALT 25 12/21/2013 0450   BILITOT 0.25 01/08/2014 1613   BILITOT 0.7 12/21/2013 0450       Lab Results  Component Value Date   LABCA2 8 06/08/2011    No components found with this basename: [id number]    No results found for this basename: INR,  in the last 168  hours  Urinalysis    Component Value Date/Time   COLORURINE YELLOW 06/07/2011 1942   APPEARANCEUR CLEAR 06/07/2011 1942   LABSPEC 1.009 06/07/2011 1942   PHURINE 6.0 06/07/2011 1942   GLUCOSEU NEGATIVE 06/07/2011 1942   HGBUR NEGATIVE 06/07/2011 1942   BILIRUBINUR neg 12/25/2011 [place] 06/07/2011 1942   KETONESUR NEGATIVE 06/07/2011 1942   PROTEINUR neg 12/25/2011 1216   PROTEINUR NEGATIVE 06/07/2011 1942   UROBILINOGEN negative 12/25/2011 1216   UROBILINOGEN 0.2 06/07/2011 1942   NITRITE neg 12/25/2011 1216   NITRITE NEGATIVE 06/07/2011 1942   LEUKOCYTESUR Negative 12/25/2011 1216    STUDIES: Dg Chest Port 1 View  12/23/2013   CLINICAL DATA:  Worsening hypoxia. Breast cancer. Pancreatic carcinoma.  EXAM: PORTABLE CHEST - 1 VIEW  COMPARISON:  12/21/13  FINDINGS: Low lung volumes are demonstrated with increased atelectasis and probable small bilateral pleural effusions the lung bases. Cardiomegaly is stable. Left arm PICC line remains in appropriate position.  IMPRESSION: Increased bibasilar atelectasis and probable small bilateral pleural effusions. Stable cardiomegaly.   Electronically Signed   By: Earle Gell M.D.   On: 12/23/2013 18:54   Dg Chest Port 1 View  12/21/2013   CLINICAL DATA:  Line placement, history pancreatic cancer, post cholecystectomy, pancreatectomy, splenectomy and partial gastrectomy on 12/20/2013  EXAM: PORTABLE CHEST - 1 VIEW  COMPARISON:  Portable exam 1247 hr compared to 12/01/2013  FINDINGS: Left arm PICC line tip projects over cavoatrial junction.  Nasogastric tube projects over stomach with tip at antrum.  Surgical drain left upper quadrant.  Upper abdominal skin clips and free air noted consistent with preceding abdominal surgery.  Enlargement of cardiac silhouette.  Mild right basilar atelectasis.  Atelectasis versus consolidation left lower lobe.  Upper lungs clear.  IMPRESSION: Enlargement of cardiac silhouette.  Mild right basilar atelectasis.  Atelectasis  versus consolidation left lower lobe.   Electronically Signed   By: Lavonia Dana M.D.   On: 12/21/2013 13:18   ASSESSMENT: 68 y.o. 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 noted on abdominal MRI November 2014  PANCREATIC CANCER: (3) status post endoscopic ultrasonography of the pancreas 11/23/2013 with cytology positive for adenocarcinoma, clinical stage T2 NX MX adenocarcinoma associated with a 5.5 cm pseudocyst (which was aspirated); baseline CA 19-9 was 47.0  (4) left pleural effusion status post thoracentesis 12/01/2013, cytology negative  (5) s/plaparoscopic cholecystectomy, open extended distal pancreatectomy, splenectomy, partial gastrectomy x2 12/20/2013 for a pT1 pN0, stage IA invasive ductal adenocarcinoma, grade 2, with positive resection margins  PLAN: Rinne is ready to start her pre-radiation gemcitabine. We are following the protocol from [facility] in 2008, and she will receive 3 doses of weekly Gemzar prior to her radiation. She will then receive 5-FU in the form of capecitabine during the radiation, and then she will receive 12 additional weeks of Gemzar after the radiation.  Today we again went over the possible side effects, toxicities, and complications. I suggested she go ahead and take her nausea medicine today and tomorrow morning, even  though that probably is a little bit of overkill. She is getting significant tolerance of her Xanax and I suggested she try switching to lorazepam, which will be easier to get off of later on.  Otherwise we are ready to get going. She has a good understanding of the treatment plan and agrees with it. She notes a goal of care in her case is cure. She will call with any problems that may develop before her next visit here, which will be next week.  Chauncey Cruel, MD   02/01/2014 8:44 AM

## 2014-02-02 ENCOUNTER — Telehealth: Payer: Self-pay | Admitting: *Deleted

## 2014-02-02 NOTE — Telephone Encounter (Signed)
PT. WAS AT [place]. SHE IS EATING AND FORCING FLUIDS. NO NAUSEA OR VOMITING. NO BOWEL ISSUES. NO PROBLEMS OR QUESTIONS AT THIS TIME. REMINDED PT. TO CALL THIS OFFICE IF THE NEED ARISES. SHE IS AWARE THAT THERE IS AN ON CALL PHYSICIAN.

## 2014-02-06 ENCOUNTER — Encounter (HOSPITAL_COMMUNITY): Payer: Self-pay | Admitting: General Surgery

## 2014-02-06 NOTE — OR Nursing (Signed)
Addendum to [employer] chart, was missing procedure end time. Mohammed Kindle, RN

## 2014-02-07 ENCOUNTER — Other Ambulatory Visit: Payer: Self-pay

## 2014-02-08 ENCOUNTER — Ambulatory Visit (HOSPITAL_BASED_OUTPATIENT_CLINIC_OR_DEPARTMENT_OTHER): Payer: Medicare Other

## 2014-02-08 ENCOUNTER — Ambulatory Visit: Payer: Medicare Other | Admitting: Nutrition

## 2014-02-08 ENCOUNTER — Ambulatory Visit (HOSPITAL_BASED_OUTPATIENT_CLINIC_OR_DEPARTMENT_OTHER): Payer: Medicare Other | Admitting: Oncology

## 2014-02-08 ENCOUNTER — Other Ambulatory Visit (HOSPITAL_BASED_OUTPATIENT_CLINIC_OR_DEPARTMENT_OTHER): Payer: Medicare Other

## 2014-02-08 VITALS — BP 130/63 | HR 58 | Temp 98.1°F | Resp 18 | Ht 65.0 in | Wt 148.9 lb

## 2014-02-08 DIAGNOSIS — D7389 Other diseases of spleen: Secondary | ICD-10-CM

## 2014-02-08 DIAGNOSIS — Z5111 Encounter for antineoplastic chemotherapy: Secondary | ICD-10-CM

## 2014-02-08 DIAGNOSIS — J90 Pleural effusion, not elsewhere classified: Secondary | ICD-10-CM

## 2014-02-08 DIAGNOSIS — C252 Malignant neoplasm of tail of pancreas: Secondary | ICD-10-CM

## 2014-02-08 DIAGNOSIS — Z452 Encounter for adjustment and management of vascular access device: Secondary | ICD-10-CM

## 2014-02-08 DIAGNOSIS — C251 Malignant neoplasm of body of pancreas: Secondary | ICD-10-CM

## 2014-02-08 DIAGNOSIS — Z853 Personal history of malignant neoplasm of breast: Secondary | ICD-10-CM

## 2014-02-08 LAB — CBC WITH DIFFERENTIAL/PLATELET
BASO%: 2 % (ref 0.0–2.0)
BASOS ABS: 0.1 10*3/uL (ref 0.0–0.1)
EOS%: 3.8 % (ref 0.0–7.0)
Eosinophils Absolute: 0.2 10*3/uL (ref 0.0–0.5)
HEMATOCRIT: 35.3 % (ref 34.8–46.6)
HEMOGLOBIN: 11.9 g/dL (ref 11.6–15.9)
LYMPH%: 31.3 % (ref 14.0–49.7)
MCH: 28.5 pg (ref 25.1–34.0)
MCHC: 33.7 g/dL (ref 31.5–36.0)
MCV: 84.4 fL (ref 79.5–101.0)
MONO#: 0.5 10*3/uL (ref 0.1–0.9)
MONO%: 12.2 % (ref 0.0–14.0)
NEUT#: 2.3 10*3/uL (ref 1.5–6.5)
NEUT%: 50.7 % (ref 38.4–76.8)
Platelets: 434 10*3/uL — ABNORMAL HIGH (ref 145–400)
RBC: 4.18 10*6/uL (ref 3.70–5.45)
RDW: 16 % — ABNORMAL HIGH (ref 11.2–14.5)
WBC: 4.4 10*3/uL (ref 3.9–10.3)
lymph#: 1.4 10*3/uL (ref 0.9–3.3)
nRBC: 2 % — ABNORMAL HIGH (ref 0–0)

## 2014-02-08 MED ORDER — SODIUM CHLORIDE 0.9 % IJ SOLN
10.0000 mL | INTRAMUSCULAR | Status: DC | PRN
  Administered 2014-02-08: 10 mL
  Filled 2014-02-08: qty 10

## 2014-02-08 MED ORDER — HEPARIN SOD (PORK) LOCK FLUSH 100 UNIT/ML IV SOLN
500.0000 [IU] | Freq: Once | INTRAVENOUS | Status: AC | PRN
  Administered 2014-02-08: 500 [IU]
  Filled 2014-02-08: qty 5

## 2014-02-08 MED ORDER — SODIUM CHLORIDE 0.9 % IV SOLN
Freq: Once | INTRAVENOUS | Status: AC
  Administered 2014-02-08: 11:00:00 via INTRAVENOUS

## 2014-02-08 MED ORDER — PROCHLORPERAZINE MALEATE 10 MG PO TABS
ORAL_TABLET | ORAL | Status: AC
  Filled 2014-02-08: qty 1

## 2014-02-08 MED ORDER — ALTEPLASE 2 MG IJ SOLR
2.0000 mg | Freq: Once | INTRAMUSCULAR | Status: AC | PRN
  Administered 2014-02-08: 2 mg
  Filled 2014-02-08: qty 2

## 2014-02-08 MED ORDER — PROCHLORPERAZINE MALEATE 10 MG PO TABS
10.0000 mg | ORAL_TABLET | Freq: Once | ORAL | Status: AC
  Administered 2014-02-08: 10 mg via ORAL

## 2014-02-08 MED ORDER — SODIUM CHLORIDE 0.9 % IV SOLN
1000.0000 mg/m2 | Freq: Once | INTRAVENOUS | Status: AC
  Administered 2014-02-08: 1748 mg via INTRAVENOUS
  Filled 2014-02-08: qty 45.97

## 2014-02-08 NOTE — Patient Instructions (Signed)
[facility] Discharge Instructions for Patients Receiving Chemotherapy  Today you received the following chemotherapy agents Gemzar.  To help prevent nausea and vomiting after your treatment, we encourage you to take your nausea medication as prescribed.   If you develop nausea and vomiting that is not controlled by your nausea medication, call the clinic.   BELOW ARE SYMPTOMS THAT SHOULD BE REPORTED IMMEDIATELY:  *FEVER GREATER THAN 100.5 F  *CHILLS WITH OR WITHOUT FEVER  NAUSEA AND VOMITING THAT IS NOT CONTROLLED WITH YOUR NAUSEA MEDICATION  *UNUSUAL SHORTNESS OF BREATH  *UNUSUAL BRUISING OR BLEEDING  TENDERNESS IN MOUTH AND THROAT WITH OR WITHOUT PRESENCE OF ULCERS  *URINARY PROBLEMS  *BOWEL PROBLEMS  UNUSUAL RASH Items with * indicate a potential emergency and should be followed up as soon as possible.  Feel free to call the clinic you have any questions or concerns. The clinic phone number is [phone].    

## 2014-02-08 NOTE — Progress Notes (Signed)
Nutrition followup completed with patient during chemotherapy.  She is diagnosed with pancreas cancer and is a patient of Dr. Jana Hakim.    Patient reports she feels well.  She has a good appetite.  She is taking medications as prescribed including Creon with meals.  She denies diarrhea.  Weight is generally stable at 148 pounds for the past 3 weeks.  Patient reports she is compliant with a low-fat diet.  She is concerned about having pancreatitis again.  She states indigestion has resolved.  Nutrition diagnosis: Food and nutrition related knowledge deficit improved.  Intervention: Patient educated to continue low-fat diet and avoiding alcohol.  Encouraged patient to continue to read labels for total fat content.  Encouraged and recommended continued compliance with medications.  Recommended weight maintenance.  Monitoring, evaluation, goals: Patient is tolerating adequate calories and protein, and has had weight stability for 3 weeks.  Next visit: No followup scheduled at this time.  Patient has contact information for questions or concerns.

## 2014-02-08 NOTE — Progress Notes (Signed)
[place]  Telephone:(336) [phone] Fax:(336) [phone]     ID: Annette Beltran OB: October 11, 1946  MR#: [id number]  ZJQ#:[id number]  PCP: [name], MD GYN:   SU: [name] OTHER MD: Delfin Edis, [name]  CHIEF COMPLAINT: "I haven't felt well since September"  PANCREATIC CANCER HISTORY: I formerly followed Annette Beltran for a stage I breast cancer, which was treated with surgery, radiation, and anti-estrogens. She was released from followup here in 2007. In addition, in 2012 she had abdominal pain leading to hospitalization. 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 The patient also has a history of collagenous colitis, which of course carries its own set of symptoms, making identification of her new attic problem more difficult.  Annette Beltran tells me she started "feeling bad" in September of 2014. There was abdominal discomfort localizing to the left upper o'clock on, worse with inspiration. CT angiography 08/21/2013 showed no clot and no significant abnormalities. Plain rib and left shoulder views were negative. CT abdomen and pelvis with contrast 08/22/2013 showed slight prominence of the pancreatic duct in the distal body and tail with a new small structure emanating from the tail of the pancreas consistent with a pseudocyst. Exudate near the tail of the pancreas suggested pancreatitis. MRI of the abdomen 09/03/2013 confirmed several ill-defined fluid collections between the gastric fundus and the splenic hilum surrounding the pancreatic tail. There was no evidence of a pancreatic mass. The pancreatic tail did enhanced following contrast. The splenic vein appeared chronically thrombosed with a small amount of nonocclusive thrombus extending into the main and left portal  veins.  On 09/26/2013 amylase was 237 and lipase 513.0, consistent with acute pancreatitis. These numbers subsequently dropped in on 11/28/2013 amylase was normal at 47 and lipase was mildly elevated at 110  With continuing left-sided abdominal discomfort, abdominal ultrasound 11/13/2013 showed multiple gallstones with no evidence of cholecystitis. Evaluation of the pancreas showed a hypoechoic lobulated focus measuring up to 7.4 cm felt to be nonspecific. Repeat abdominal CT 11/14/2013 showed diffuse pancreatic atrophy without evidence of a mass, again consistent with chronic pancreatitis. There was mild soft tissue stranding suspicious for mild superimposed acute pancreatitis. A new rim-enhancing fluid collection was noted in the gastrosplenic ligament measuring 4.5 cm, consistent with a pseudocyst. Finally on 11/23/2013, endoscopic ultrasonography under Annette Beltran showed a subtle 2.5 cm soft tissue mass in the mid-pancreas, abutting the splenic vein. This was biopsied x2, with cytology (NZB [id number]) showing adenocarcinoma. A peripancreatic fluid collection measuring over 5 cm was aspirated yielding 25 cc of chocolate colored fluid. Two or three scattered round hypoechoic lymph nodes were noted near the pancreatic tail but were not sampled.  In addition, on 11/28/2013 a chest x-ray obtained to evaluate upper respiratory symptoms showed a small to moderate left effusion. Chest CT scan 11/29/2013 confirmed a moderate left-sided pleural effusion with no suspicious appearing pulmonary nodules or masses associated with it. The complex cystic lesion associated with the tail of the pancreas was again noted, measuring 5.5 cm despite the recent aspiration procedure.  The patient's subsequent history is as detailed below.  INTERVAL HISTORY: Annette Beltran returns today with her husband and daughter for followup of her pancreatic cancer. Today is day 8 cycle 1 of 4 planned cycles of gemcitabine chemotherapy.  REVIEW OF  SYSTEMS: Annette Beltran tolerated her treatment  remarkably well. She had no nausea or vomiting. Her sense of taste is intact. Her bowel movements are less frequent and more formed. She did sleep a little more than usual for a couple of days after her treatment. On the day he 3 she developed a shooting pain over her right ear area. She had no symptoms of sinus congestion and she is already on Claritin daily. She had a lot of visitors at home during that period and she thinks may be stress had something to do with it. The port functioned well once they got into it but it was very hard to axis last time. Hopefully we will do better this time. Aside from that and some minor bilateral ankle swelling, a detailed review of systems today was entirely noncontributory  PAST MEDICAL HISTORY: Past Medical History  Diagnosis Date  . Macular degeneration   . Fluid retention   . Colitis, collagenous   . Vitamin D deficiency   . Osteopenia   . Celiac artery stenosis   . Splenic infarction   . Arthritis   . History of blood clots   . Anxiety   . Shingles   . Pancreatitis   . Osteoporosis   . Clotting disorder   . Breast cancer     rt lumpectomy  . Arrhythmia     "skips a beat"  [place]  heart  . GERD (gastroesophageal reflux disease)     PAST SURGICAL HISTORY: Past Surgical History  Procedure Laterality Date  . Foot surgery  2003    x 3, 2 on right, 1 on left  . Breast lumpectomy Right     radiation for 6 weeks  . Meniscus repair Right   . Eus N/A 11/23/2013    Procedure: UPPER ENDOSCOPIC ULTRASOUND (EUS) LINEAR;  Surgeon: Milus Banister, MD;  Location: [facility];  Service: Endoscopy;  Laterality: N/A;  . Tonsillectomy    . Laparoscopy N/A 12/20/2013    Procedure: LAPAROSCOPY DIAGNOSTIC ;  Surgeon: [name], MD;  Location: [facility];  Service: General;  Laterality: N/A;  . Cholecystectomy N/A 12/20/2013    Procedure: LAPAROSCOPIC CHOLECYSTECTOMY;  Surgeon: [name], MD;  Location: [facility];   Service: General;  Laterality: N/A;  . Splenectomy, total N/A 12/20/2013    Procedure: SPLENECTOMY;  Surgeon: [name], MD;  Location: [facility];  Service: General;  Laterality: N/A;  . Partial gastrectomy N/A 12/20/2013    Procedure: PARTIAL GASTRECTOMY;  Surgeon: [name], MD;  Location: [facility];  Service: General;  Laterality: N/A;  . Portacath placement N/A 01/15/2014    Procedure: INSERTION PORT-A-CATH;  Surgeon: [name], MD;  Location: [facility];  Service: General;  Laterality: N/A;    FAMILY HISTORY Family History  Problem Relation Age of Onset  . Colon cancer Neg Hx   . Aneurysm Mother   . Stroke Mother   . Prostate cancer Father 7  . Skin cancer Brother 46    treated with interferon for 1 year  . Heart failure Paternal Aunt   . Breast cancer Paternal Aunt     dx over 37s  . Pancreatic cancer Maternal Grandmother     dx in her 45s  . Breast cancer Maternal Aunt     dx over 60  . Rheum arthritis Brother   . Breast cancer Paternal Aunt     dx over 15s  . Leukemia Paternal Aunt    the patient's father had a diagnosis of prostate cancer metastatic to the liver or  a died at age 68. The patient's mother died secondary to carotid third brain aneurysm at the age of 45. The patient had 2 brothers, no sisters. One brother has a history of melanoma. The patient's mother is mother was diagnosed with pancreatic cancer in her 32s. There is a history of breast cancer and second degree relatives, none before the age of 26  GYNECOLOGIC HISTORY:  Menarche age 61, first live birth age 62, the patient is [place] P2.. Stopped in her early 33s. 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 Her husband of 75 years, [name], retired from [employer] and currently works part-time as a Cabin crew with [employer]. Daughter Marita [place] teaches first grade. Daughter Leafy Ro is a homemaker. The  patient has 4 grandchildren [name] has an additional 5 grandchildren of his own). The patient attends a [employer]    ADVANCED DIRECTIVES: In place   HEALTH MAINTENANCE: History  Substance Use Topics  . Smoking status: Former Smoker -- 0.50 packs/day for 28 years    Types: Cigarettes    Quit date: 11/02/2000  . Smokeless tobacco: Never Used  . Alcohol Use: Yes     Comment: occ wine      Colonoscopy:  PAP:  Bone density:  Lipid panel:  Mammography:  Allergies  Allergen Reactions  . Codeine Nausea And Vomiting  . Lactose Intolerance (Gi)   . Tequin     Severe stomach pain  . Penicillins Nausea Only and Rash    Current Outpatient Prescriptions  Medication Sig Dispense Refill  . ALPRAZolam (XANAX) 0.5 MG tablet Take 1 tablet (0.5 mg total) by mouth 2 (two) times daily as needed for anxiety.  60 tablet  5  . Ascorbic Acid (VITAMIN C PO) Take 1 tablet by mouth 2 (two) times daily.      Marland Kitchen aspirin 81 MG tablet Take 81 mg by mouth daily.      Marland Kitchen b complex vitamins tablet Take 1 tablet by mouth daily.      . capecitabine (XELODA) 500 MG tablet Take two tablets twice daily on radiation days  120 tablet  0  . diphenhydrAMINE (BENADRYL) 25 MG tablet Take 25 mg by mouth at bedtime.       Marland Kitchen esomeprazole (NEXIUM) 20 MG capsule Take 20 mg by mouth daily at 12 noon.      . feeding supplement, RESOURCE BREEZE, (RESOURCE BREEZE) LIQD Take 1 Container by mouth 2 (two) times daily between meals.  30 Container  3  . lidocaine-prilocaine (EMLA) cream Apply 1-2 hours to port a cath before procedure.  30 g  6  . lipase/protease/amylase (CREON-12/PANCREASE) 12000 UNITS CPEP capsule Take 2 capsules by mouth 3 (three) times daily with meals.  270 capsule  3  . loratadine (CLARITIN) 10 MG tablet Take 10 mg by mouth daily.      Marland Kitchen LORazepam (ATIVAN) 0.5 MG tablet Take 1 tablet (0.5 mg total) by mouth every 6 (six) hours as needed (Nausea or vomiting).  30 tablet  0  . naproxen sodium  (ANAPROX) 220 MG tablet Take 220 mg by mouth 2 (two) times daily as needed (pain).       . ondansetron (ZOFRAN) 8 MG tablet Take 1 tablet (8 mg total) by mouth 2 (two) times daily as needed (Nausea or vomiting).  30 tablet  1  . oxyCODONE-acetaminophen (PERCOCET/ROXICET) 5-325  MG per tablet Take 1-2 tablets by mouth every 4 (four) hours as needed (for pain).  20 tablet  0  . PARoxetine (PAXIL) 10 MG tablet Take 1 tablet (10 mg total) by mouth daily.  30 tablet  5  . prochlorperazine (COMPAZINE) 10 MG tablet Take 1 tablet (10 mg total) by mouth every 6 (six) hours as needed (Nausea or vomiting).  30 tablet  1  . zolpidem (AMBIEN) 5 MG tablet Take 1 tablet (5 mg total) by mouth at bedtime as needed for sleep.  30 tablet  1   No current facility-administered medications for this visit.    OBJECTIVE: Middle-aged white womanwho appears well Filed Vitals:   02/08/14 0816  BP: 130/63  Pulse: 58  Temp: 98.1 F (36.7 C)  Resp: 18     Body mass index is 24.78 kg/(m^2).    ECOG FS:0 - Asymptomatic  Ocular: Sclerae unicteric, pupils  equal and reactive  Ear-nose-throat: Oropharynx clear; right ear shows no obstruction, TM poorly with normal light reflex  Lymphatic: No cervical or supraclavicular adenopathy Lungs no rales or rhonchi, minimal dullness at the left base  Heart regular rate and rhythm, no murmur appreciated Abd  soft, nontender, positive bowel sounds, no masses palpated (all quadrants) MSK no focal spinal tenderness, no ankle noted edema Neuro: non-focal, well-oriented, positive affect Breasts: Deferred  LAB RESULTS:  CMP     Component Value Date/Time   NA 144 02/01/2014 0821   NA 143 12/26/2013 0620   K 3.7 02/01/2014 0821   K 3.7 12/26/2013 0620   CL 101 12/26/2013 0620   CO2 23 02/01/2014 0821   CO2 32 12/26/2013 0620   GLUCOSE 119 02/01/2014 0821   GLUCOSE 92 12/26/2013 0620   BUN 15.5 02/01/2014 0821   BUN 9 12/26/2013 0620   CREATININE 0.7 02/01/2014 0821   CREATININE 0.57  12/26/2013 0620   CREATININE 0.62 11/28/2013 1635   CALCIUM 9.4 02/01/2014 0821   CALCIUM 8.7 12/26/2013 0620   PROT 7.0 02/01/2014 0821   PROT 5.0* 12/21/2013 0450   ALBUMIN 3.7 02/01/2014 0821   ALBUMIN 2.3* 12/21/2013 0450   AST 17 02/01/2014 0821   AST 42* 12/21/2013 0450   ALT 12 02/01/2014 0821   ALT 25 12/21/2013 0450   ALKPHOS 79 02/01/2014 0821   ALKPHOS 46 12/21/2013 0450   BILITOT 0.52 02/01/2014 0821   BILITOT 0.7 12/21/2013 0450   GFRNONAA >90 12/26/2013 0620   GFRAA >90 12/26/2013 0620    I No results found for this basename: SPEP,  UPEP,   kappa and lambda light chains    Lab Results  Component Value Date   WBC 8.6 02/01/2014   NEUTROABS 5.5 02/01/2014   HGB 12.6 02/01/2014   HCT 37.2 02/01/2014   MCV 85.4 02/01/2014   PLT 453* 02/01/2014  Results for AMESHIA, PEWITT (MRN [id number]) as of 01/11/2014 06:31  Ref. Range 11/29/2013 15:33 01/08/2014 16:13  CA 19-9 Latest Range: <35.0 U/mL 47.0 (H) 17.9      Chemistry      Component Value Date/Time   NA 144 02/01/2014 0821   NA 143 12/26/2013 0620   K 3.7 02/01/2014 0821   K 3.7 12/26/2013 0620   CL 101 12/26/2013 0620   CO2 23 02/01/2014 0821   CO2 32 12/26/2013 0620   BUN 15.5 02/01/2014 0821   BUN 9 12/26/2013 0620   CREATININE 0.7 02/01/2014 0821   CREATININE 0.57 12/26/2013 0620   CREATININE 0.62 11/28/2013 1635  Component Value Date/Time   CALCIUM 9.4 02/01/2014 0821   CALCIUM 8.7 12/26/2013 0620   ALKPHOS 79 02/01/2014 0821   ALKPHOS 46 12/21/2013 0450   AST 17 02/01/2014 0821   AST 42* 12/21/2013 0450   ALT 12 02/01/2014 0821   ALT 25 12/21/2013 0450   BILITOT 0.52 02/01/2014 0821   BILITOT 0.7 12/21/2013 0450       Lab Results  Component Value Date   LABCA2 8 06/08/2011    No components found with this basename: [id number]    No results found for this basename: INR,  in the last 168 hours  Urinalysis    Component Value Date/Time   COLORURINE YELLOW 06/07/2011 1942   APPEARANCEUR CLEAR 06/07/2011 1942   LABSPEC 1.009 06/07/2011 1942   PHURINE  6.0 06/07/2011 1942   GLUCOSEU NEGATIVE 06/07/2011 1942   HGBUR NEGATIVE 06/07/2011 1942   BILIRUBINUR neg 12/25/2011 [place] 06/07/2011 1942   KETONESUR NEGATIVE 06/07/2011 1942   PROTEINUR neg 12/25/2011 1216   PROTEINUR NEGATIVE 06/07/2011 1942   UROBILINOGEN negative 12/25/2011 1216   UROBILINOGEN 0.2 06/07/2011 1942   NITRITE neg 12/25/2011 1216   NITRITE NEGATIVE 06/07/2011 1942   LEUKOCYTESUR Negative 12/25/2011 1216    STUDIES: Dg Chest 2 View  01/15/2014   CLINICAL DATA:  Preoperative evaluation prior to Port-A-Cath placement.  EXAM: CHEST  2 VIEW  COMPARISON:  Chest x-ray 12/23/2013.  FINDINGS: Linear opacity in the left lower lung is favored to represent a focus of subsegmental atelectasis and/or scarring. Lung volumes are normal. No consolidative airspace disease. No pleural effusions. No pneumothorax. No pulmonary nodule or mass noted. Pulmonary vasculature and the cardiomediastinal silhouette are within normal limits.  IMPRESSION: 1.  No radiographic evidence of acute cardiopulmonary disease.   Electronically Signed   By: Vinnie Langton M.D.   On: 01/15/2014 07:02   Dg Chest Port 1 View  01/15/2014   CLINICAL DATA:  Port-A-Cath placement  EXAM: PORTABLE CHEST - 1 VIEW  COMPARISON:  01/15/2014  FINDINGS: There has been interval placement of a left subclavian Port-A-Cath with tip overlying the mid to upper SVC. The cardiac silhouette is accentuated by AP technique and shallow or lung volume. The lungs are hypoinflated compared to the prior study with increased opacity in the left greater than right lung bases. No pleural effusion or pneumothorax is identified. No acute osseous abnormality is seen.  IMPRESSION: 1. Interval Port-A-Cath placement as above. 2. Shallower lung volumes with bibasilar opacity, likely atelectasis.   Electronically Signed   By: Logan Bores   On: 01/15/2014 09:01   Dg C-arm 1-60 Min-no Report  01/15/2014   CLINICAL DATA: port a cath placement   C-ARM 1-60  MINUTES  Fluoroscopy was utilized by the requesting physician.  No radiographic  interpretation.    ASSESSMENT: 68 y.o. 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 noted on abdominal  MRI November 2014  PANCREATIC CANCER: (3) status post endoscopic ultrasonography of the pancreas 11/23/2013 with cytology positive for adenocarcinoma, clinical stage T2 NX MX adenocarcinoma associated with a 5.5 cm pseudocyst (which was aspirated); baseline CA 19-9 was 47.0  (4) left pleural effusion status post thoracentesis 12/01/2013, cytology negative  (5) s/plaparoscopic cholecystectomy, open extended distal pancreatectomy, splenectomy, partial gastrectomy x2 12/20/2013 for a pT1 pN0, stage IA invasive ductal adenocarcinoma, grade 2, with positive resection margins  (6) adjuvant gemcitabine started 02/01/2014-- to receive 3 weekly doses before radiation starts  (7) to receive radiosensitization with capecitabine 1000 mg po BID on radiation days  PLAN: Kelee did terrific with her first cycle of gemcitabine. I think the little bit of pain she had on the right temporal area was most likely secondary to stress. She was a little sleepy but that was her only side effect. We are proceeding with day 8 cycle 1 today. Making no changes in her treatment. She will return next week for her third cycle and  after that she will be ready to start her radiation at Dr. Johny Shears discretion.  Lennie already has the capecitabine in hand we will start those once radiation has been started.  She knows to call for any problems that may develop before next visit here.  Chauncey Cruel, MD   02/08/2014 8:19 AM

## 2014-02-12 ENCOUNTER — Encounter (INDEPENDENT_AMBULATORY_CARE_PROVIDER_SITE_OTHER): Payer: Self-pay | Admitting: General Surgery

## 2014-02-12 ENCOUNTER — Ambulatory Visit (INDEPENDENT_AMBULATORY_CARE_PROVIDER_SITE_OTHER): Payer: Medicare Other | Admitting: General Surgery

## 2014-02-12 VITALS — BP 118/60 | HR 62 | Resp 14 | Ht 64.5 in | Wt 149.8 lb

## 2014-02-12 DIAGNOSIS — C251 Malignant neoplasm of body of pancreas: Secondary | ICD-10-CM

## 2014-02-12 NOTE — Assessment & Plan Note (Signed)
Tolerating chemotherapy.  Getting set up for chemoradiation.  Follow up in august.

## 2014-02-12 NOTE — Patient Instructions (Signed)
Follow up in august.    Continue chemo/chemoradiation.

## 2014-02-12 NOTE — Progress Notes (Signed)
HISTORY: Pt is s/p port a cath insertion.  She is doing well overall, but they are having issues getting blood to draw from port.    Minimal issues with gemzar.      EXAM: General:  Alert and oriented. Incision:  No evidence of infection.     PATHOLOGY: n/a   ASSESSMENT AND PLAN:   1.8 cm adenocarcinoma of the body of the pancreas Tolerating chemotherapy.  Getting set up for chemoradiation.  Follow up in august.    Pull arm down to feet while accessing.      Milus Height, MD Surgical Oncology, [facility]  [name], MD [name], MD

## 2014-02-14 ENCOUNTER — Ambulatory Visit
Admission: RE | Admit: 2014-02-14 | Discharge: 2014-02-14 | Disposition: A | Discharge status: Home / Self Care | Payer: Medicare Other | Source: Ambulatory Visit | Attending: Radiation Oncology | Admitting: Radiation Oncology

## 2014-02-14 DIAGNOSIS — C251 Malignant neoplasm of body of pancreas: Secondary | ICD-10-CM | POA: Insufficient documentation

## 2014-02-14 DIAGNOSIS — Z510 Encounter for antineoplastic radiation therapy: Secondary | ICD-10-CM | POA: Insufficient documentation

## 2014-02-14 NOTE — Progress Notes (Signed)
  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  Identity was confirmed.  All relevant records and images related to the planned course of therapy were reviewed.  The patient freely provided informed written consent to proceed with treatment after reviewing the details related to the planned course of therapy. The consent form was witnessed and verified by the simulation staff.  Then, the patient was set-up in a stable reproducible  supine position for radiation therapy.  CT images were obtained.  Surface markings were placed.  The CT images were loaded into the planning software.  Then the target and avoidance structures were contoured.  Treatment planning then occurred.  The radiation prescription was entered and confirmed.  Then, I designed and supervised the construction of a total of 5 medically necessary complex treatment devices including a body fix immobilization mold, and 4 multileaf collimators to shape radiation around the target volume while shielding critical structures of the kidneys, spinal cord, stomach and bowel maximally..  I have requested : 3D Simulation  I have requested a DVH of the following structures: Left kidney, right kidney, spinal cord, stomach, and liver as well as the target volume.  I have ordered:CBC  PLAN:  The patient will receive 50.4 Gy in 28 fractions.  ________________________________  Sheral Apley Tammi Klippel, M.D.

## 2014-02-14 NOTE — Progress Notes (Signed)
  Radiation Oncology         [phone] ________________________________  Name: CAMREN HENTHORN MRN: [id number]  Date: 02/14/2014  DOB: 19-Nov-1945  Optical Surface Tracking Plan:  Since intensity modulated radiotherapy (IMRT) and 3D conformal radiation treatment methods are predicated on accurate and precise positioning for treatment, intrafraction motion monitoring is medically necessary to ensure accurate and safe treatment delivery.  The ability to quantify intrafraction motion without excessive ionizing radiation dose can only be performed with optical surface tracking. Accordingly, surface imaging offers the opportunity to obtain 3D measurements of patient position throughout IMRT and 3D treatments without excessive radiation exposure.  I am ordering optical surface tracking for this patient's upcoming course of radiotherapy. ________________________________  Sheral Apley. Tammi Klippel, M.D.    Reference:   Ursula Alert, J, et al. Surface imaging-based analysis of intrafraction motion for breast radiotherapy patients.Journal of [place], n. 6, nov. 2014. ISSN [id number].   Available at: <http://www.jacmp.org/index.php/jacmp/article/view/4957>.

## 2014-02-14 NOTE — Progress Notes (Signed)
  Radiation Oncology         [phone] ________________________________  Name: Annette Beltran MRN: [id number]  Date: 02/14/2014  DOB: [birth year]  SPECIAL TREATMENT PROCEDURE NOTE  NARRATIVE:  The planned course of therapy using radiation constitutes a special treatment procedure. Special care is required in the management of this patient for the following reasons.  She will continue concurrent chemotherapy requiring careful monitoring for increased toxicities of treatment including weekly laboratory values..   The special nature of the planned course of radiotherapy will require increased physician supervision and oversight to ensure patient's safety with optimal treatment outcomes. ________________________________  Sheral Apley Tammi Klippel, M.D.

## 2014-02-15 ENCOUNTER — Ambulatory Visit (HOSPITAL_BASED_OUTPATIENT_CLINIC_OR_DEPARTMENT_OTHER): Payer: Medicare Other

## 2014-02-15 ENCOUNTER — Ambulatory Visit (HOSPITAL_BASED_OUTPATIENT_CLINIC_OR_DEPARTMENT_OTHER): Payer: Medicare Other | Admitting: Oncology

## 2014-02-15 ENCOUNTER — Other Ambulatory Visit: Payer: Medicare Other

## 2014-02-15 VITALS — BP 112/68 | HR 108 | Temp 98.1°F | Resp 18 | Ht 64.0 in | Wt 150.8 lb

## 2014-02-15 DIAGNOSIS — C259 Malignant neoplasm of pancreas, unspecified: Secondary | ICD-10-CM

## 2014-02-15 DIAGNOSIS — C251 Malignant neoplasm of body of pancreas: Secondary | ICD-10-CM

## 2014-02-15 DIAGNOSIS — C50911 Malignant neoplasm of unspecified site of right female breast: Secondary | ICD-10-CM

## 2014-02-15 DIAGNOSIS — I700 Atherosclerosis of aorta: Secondary | ICD-10-CM

## 2014-02-15 DIAGNOSIS — Z853 Personal history of malignant neoplasm of breast: Secondary | ICD-10-CM

## 2014-02-15 DIAGNOSIS — Z5111 Encounter for antineoplastic chemotherapy: Secondary | ICD-10-CM

## 2014-02-15 DIAGNOSIS — K52831 Collagenous colitis: Secondary | ICD-10-CM

## 2014-02-15 DIAGNOSIS — D735 Infarction of spleen: Secondary | ICD-10-CM

## 2014-02-15 DIAGNOSIS — M8580 Other specified disorders of bone density and structure, unspecified site: Secondary | ICD-10-CM

## 2014-02-15 DIAGNOSIS — J90 Pleural effusion, not elsewhere classified: Secondary | ICD-10-CM

## 2014-02-15 LAB — CBC WITH DIFFERENTIAL/PLATELET
BASO%: 1.7 % (ref 0.0–2.0)
BASOS ABS: 0.1 10*3/uL (ref 0.0–0.1)
EOS ABS: 0.2 10*3/uL (ref 0.0–0.5)
EOS%: 3.2 % (ref 0.0–7.0)
HEMATOCRIT: 35.7 % (ref 34.8–46.6)
HEMOGLOBIN: 12.2 g/dL (ref 11.6–15.9)
LYMPH%: 35.4 % (ref 14.0–49.7)
MCH: 29 pg (ref 25.1–34.0)
MCHC: 34.2 g/dL (ref 31.5–36.0)
MCV: 84.8 fL (ref 79.5–101.0)
MONO#: 0.4 10*3/uL (ref 0.1–0.9)
MONO%: 9.1 % (ref 0.0–14.0)
NEUT%: 50.6 % (ref 38.4–76.8)
NEUTROS ABS: 2.3 10*3/uL (ref 1.5–6.5)
PLATELETS: 215 10*3/uL (ref 145–400)
RBC: 4.21 10*6/uL (ref 3.70–5.45)
RDW: 15.8 % — ABNORMAL HIGH (ref 11.2–14.5)
WBC: 4.6 10*3/uL (ref 3.9–10.3)
lymph#: 1.6 10*3/uL (ref 0.9–3.3)
nRBC: 4 % — ABNORMAL HIGH (ref 0–0)

## 2014-02-15 LAB — URINALYSIS, MICROSCOPIC - CHCC
BLOOD: NEGATIVE
Bilirubin (Urine): NEGATIVE
Glucose: NEGATIVE mg/dL
Ketones: NEGATIVE mg/dL
Leukocyte Esterase: NEGATIVE
NITRITE: NEGATIVE
PROTEIN: NEGATIVE mg/dL
RBC / HPF: NEGATIVE (ref 0–2)
SPECIFIC GRAVITY, URINE: 1.01 (ref 1.003–1.035)
Urobilinogen, UR: 0.2 mg/dL (ref 0.2–1)
pH: 6.5 (ref 4.6–8.0)

## 2014-02-15 MED ORDER — PROCHLORPERAZINE MALEATE 10 MG PO TABS
10.0000 mg | ORAL_TABLET | Freq: Once | ORAL | Status: AC
  Administered 2014-02-15: 10 mg via ORAL

## 2014-02-15 MED ORDER — SODIUM CHLORIDE 0.9 % IV SOLN
Freq: Once | INTRAVENOUS | Status: AC
  Administered 2014-02-15: 09:00:00 via INTRAVENOUS

## 2014-02-15 MED ORDER — VALACYCLOVIR HCL 1 G PO TABS: 1000.0000 mg | ORAL_TABLET | Freq: Two times a day (BID) | ORAL | Status: DC

## 2014-02-15 MED ORDER — PROCHLORPERAZINE MALEATE 10 MG PO TABS
ORAL_TABLET | ORAL | Status: AC
  Filled 2014-02-15: qty 1

## 2014-02-15 MED ORDER — SODIUM CHLORIDE 0.9 % IV SOLN
1000.0000 mg/m2 | Freq: Once | INTRAVENOUS | Status: AC
  Administered 2014-02-15: 1748 mg via INTRAVENOUS
  Filled 2014-02-15: qty 45.97

## 2014-02-15 MED ORDER — SODIUM CHLORIDE 0.9 % IJ SOLN
10.0000 mL | INTRAMUSCULAR | Status: DC | PRN
  Administered 2014-02-15: 10 mL
  Filled 2014-02-15: qty 10

## 2014-02-15 MED ORDER — HEPARIN SOD (PORK) LOCK FLUSH 100 UNIT/ML IV SOLN
500.0000 [IU] | Freq: Once | INTRAVENOUS | Status: AC | PRN
  Administered 2014-02-15: 500 [IU]
  Filled 2014-02-15: qty 5

## 2014-02-15 NOTE — Progress Notes (Signed)
[place]  [phone] [phone]     ID: KIMBALL MANSKE OB: September 07, 1946  MR#: [id number]  WHQ#:[id number]  PCP: [name], MD GYN:   SU: [name] OTHER MD: Delfin Edis, [name]  CHIEF COMPLAINT: "I haven't felt well since September"  PANCREATIC CANCER HISTORY: I formerly followed [name] for a stage I breast cancer, which was treated with surgery, radiation, and anti-estrogens. She was released from followup here in 2007. In addition, in 2012 she had abdominal pain leading to hospitalization. 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 The patient also has a history of collagenous colitis, which of course carries its own set of symptoms, making identification of her new attic problem more difficult.  Vieva tells me she started "feeling bad" in September of 68. There was abdominal discomfort localizing to the left upper o'clock on, worse with inspiration. CT angiography 08/21/2013 showed no clot and no significant abnormalities. Plain rib and left shoulder views were negative. CT abdomen and pelvis with contrast 08/22/2013 showed slight prominence of the pancreatic duct in the distal body and tail with a new small structure emanating from the tail of the pancreas consistent with a pseudocyst. Exudate near the tail of the pancreas suggested pancreatitis. MRI of the abdomen 09/03/69 confirmed several ill-defined fluid collections between the gastric fundus and the splenic hilum surrounding the pancreatic tail. There was no evidence of a pancreatic mass. The pancreatic tail did enhanced following contrast. The splenic vein appeared chronically thrombosed with a small amount of nonocclusive thrombus extending into the main and left portal  veins.  On 09/26/69 amylase was 237 and lipase 513.0, consistent with acute pancreatitis. These numbers subsequently dropped in on 11/28/2013 amylase was normal at 47 and lipase was mildly elevated at 110  With continuing left-sided abdominal discomfort, abdominal ultrasound 11/13/68 showed multiple gallstones with no evidence of cholecystitis. Evaluation of the pancreas showed a hypoechoic lobulated focus measuring up to 7.4 cm felt to be nonspecific. Repeat abdominal CT 11/14/68 showed diffuse pancreatic atrophy without evidence of a mass, again consistent with chronic pancreatitis. There was mild soft tissue stranding suspicious for mild superimposed acute pancreatitis. A new rim-enhancing fluid collection was noted in the gastrosplenic ligament measuring 4.5 cm, consistent with a pseudocyst. Finally on 11/23/68, endoscopic ultrasonography under Oretha Caprice showed a subtle 2.5 cm soft tissue mass in the mid-pancreas, abutting the splenic vein. This was biopsied x2, with cytology ([id number]) showing adenocarcinoma. A peripancreatic fluid collection measuring over 5 cm was aspirated yielding 25 cc of chocolate colored fluid. Two or three scattered round hypoechoic lymph nodes were noted near the pancreatic tail but were not sampled.  In addition, on 11/28/68 a chest x-ray obtained to evaluate upper respiratory symptoms showed a small to moderate left effusion. Chest CT scan 11/29/68 confirmed a moderate left-sided pleural effusion with no suspicious appearing pulmonary nodules or masses associated with it. The complex cystic lesion associated with the tail of the pancreas was again noted, measuring 5.5 cm despite the recent aspiration procedure.  The patient's subsequent history is as detailed below.  INTERVAL HISTORY: Mckinnley returns today with her husband and daughter for followup of her pancreatic cancer. Today is day 15 cycle 1 of 4 planned cycles of gemcitabine chemotherapy.  REVIEW OF  SYSTEMS: Kaylin is tolerating her  treatments remarkably well. She feels a little sleepy on day 1 and has a little bit of constipation on day 2. All she does for that is E. discomfort. She has not lost any hair. She has had "a lot" of mouth sores, although this has not kept her from eating and drinking normally, she tells me. She "has a tendency to get those anyway". She's had some feeling of heat and the balls of her feet and in her toes which she notices only at bedtime. There have been no hand symptoms. Otherwise a detailed review of systems today was entirely noncontributory  PAST MEDICAL HISTORY: Past Medical History  Diagnosis Date  . Macular degeneration   . Fluid retention   . Colitis, collagenous   . Vitamin D deficiency   . Osteopenia   . Celiac artery stenosis   . Splenic infarction   . Arthritis   . History of blood clots   . Anxiety   . Shingles   . Pancreatitis   . Osteoporosis   . Clotting disorder   . Breast cancer     rt lumpectomy  . Arrhythmia     "skips a beat"  [place]  heart  . GERD (gastroesophageal reflux disease)     PAST SURGICAL HISTORY: Past Surgical History  Procedure Laterality Date  . Foot surgery  2003    x 3, 2 on right, 1 on left  . Breast lumpectomy Right     radiation for 6 weeks  . Meniscus repair Right   . Eus N/A 11/23/2013    Procedure: UPPER ENDOSCOPIC ULTRASOUND (EUS) LINEAR;  Surgeon: Milus Banister, MD;  Location: [facility];  Service: Endoscopy;  Laterality: N/A;  . Tonsillectomy    . Laparoscopy N/A 12/20/2013    Procedure: LAPAROSCOPY DIAGNOSTIC ;  Surgeon: [name], MD;  Location: [facility];  Service: General;  Laterality: N/A;  . Cholecystectomy N/A 12/20/2013    Procedure: LAPAROSCOPIC CHOLECYSTECTOMY;  Surgeon: [name], MD;  Location: [facility];  Service: General;  Laterality: N/A;  . Splenectomy, total N/A 12/20/2013    Procedure: SPLENECTOMY;  Surgeon: [name], MD;  Location: [facility];  Service: General;  Laterality:  N/A;  . Partial gastrectomy N/A 12/20/2013    Procedure: PARTIAL GASTRECTOMY;  Surgeon: [name], MD;  Location: [facility];  Service: General;  Laterality: N/A;  . Portacath placement N/A 01/15/2014    Procedure: INSERTION PORT-A-CATH;  Surgeon: [name], MD;  Location: [facility];  Service: General;  Laterality: N/A;    FAMILY HISTORY Family History  Problem Relation Age of Onset  . Colon cancer Neg Hx   . Aneurysm Mother   . Stroke Mother   . Prostate cancer Father 76  . Skin cancer Brother 52    treated with interferon for 1 year  . Heart failure Paternal Aunt   . Breast cancer Paternal Aunt     dx over 26s  . Pancreatic cancer Maternal Grandmother     dx in her 67s  . Breast cancer Maternal Aunt     dx over 63  . Rheum arthritis Brother   . Breast cancer Paternal Aunt     dx over 33s  . Leukemia Paternal Aunt    the patient's father had a diagnosis of prostate cancer metastatic to the liver or a died at age 65. The patient's mother died secondary to carotid third brain aneurysm at the age of 60. The patient had 2 brothers, no sisters. One brother has a  history of melanoma. The patient's mother is mother was diagnosed with pancreatic cancer in her 44s. There is a history of breast cancer and second degree relatives, none before the age of 37  GYNECOLOGIC HISTORY:  Menarche age 10, first live birth age 69, the patient is Lucas P2.. Stopped in her early 72s. 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 Her husband of 30 years, [name], retired from [employer] and currently works part-time as a Cabin crew with [employer]. Daughter Marita [place] teaches first grade. Daughter Leafy Ro is a homemaker. The patient has 4 grandchildren [name] has an additional 5 grandchildren of his own). The patient attends a [employer]    ADVANCED DIRECTIVES: In place   HEALTH  MAINTENANCE: History  Substance Use Topics  . Smoking status: Former Smoker -- 0.50 packs/day for 28 years    Types: Cigarettes    Quit date: 11/02/2000  . Smokeless tobacco: Never Used  . Alcohol Use: Yes     Comment: occ wine      Colonoscopy:  PAP:  Bone density:  Lipid panel:  Mammography:  Allergies  Allergen Reactions  . Codeine Nausea And Vomiting  . Lactose Intolerance (Gi)   . Tequin     Severe stomach pain  . Penicillins Nausea Only and Rash    Current Outpatient Prescriptions  Medication Sig Dispense Refill  . ALPRAZolam (XANAX) 0.5 MG tablet Take 1 tablet (0.5 mg total) by mouth 2 (two) times daily as needed for anxiety.  60 tablet  5  . Ascorbic Acid (VITAMIN C PO) Take 1 tablet by mouth 2 (two) times daily.      Marland Kitchen aspirin 81 MG tablet Take 81 mg by mouth daily.      Marland Kitchen b complex vitamins tablet Take 1 tablet by mouth daily.      . capecitabine (XELODA) 500 MG tablet Take two tablets twice daily on radiation days  120 tablet  0  . diphenhydrAMINE (BENADRYL) 25 MG tablet Take 25 mg by mouth at bedtime.       Marland Kitchen esomeprazole (NEXIUM) 20 MG capsule Take 20 mg by mouth daily at 12 noon.      . feeding supplement, RESOURCE BREEZE, (RESOURCE BREEZE) LIQD Take 1 Container by mouth 2 (two) times daily between meals.  30 Container  3  . lidocaine-prilocaine (EMLA) cream Apply 1-2 hours to port a cath before procedure.  30 g  6  . lipase/protease/amylase (CREON-12/PANCREASE) 12000 UNITS CPEP capsule Take 2 capsules by mouth 3 (three) times daily with meals.  270 capsule  3  . loratadine (CLARITIN) 10 MG tablet Take 10 mg by mouth daily.      Marland Kitchen LORazepam (ATIVAN) 0.5 MG tablet Take 1 tablet (0.5 mg total) by mouth every 6 (six) hours as needed (Nausea or vomiting).  30 tablet  0  . naproxen sodium (ANAPROX) 220 MG tablet Take 220 mg by mouth 2 (two) times daily as needed (pain).       . ondansetron (ZOFRAN) 8 MG tablet Take 1 tablet (8 mg total) by mouth 2 (two) times daily  as needed (Nausea or vomiting).  30 tablet  1  . PARoxetine (PAXIL) 10 MG tablet Take 1 tablet (10 mg total) by mouth daily.  30 tablet  5  . prochlorperazine (COMPAZINE) 10 MG tablet Take 1 tablet (10 mg total) by mouth  every 6 (six) hours as needed (Nausea or vomiting).  30 tablet  1   No current facility-administered medications for this visit.    OBJECTIVE: Middle-aged white woman in no acute distress Filed Vitals:   02/15/14 0817  BP: 112/68  Pulse: 108  Temp: 98.1 F (36.7 C)  Resp: 18     Body mass index is 25.87 kg/(m^2).    ECOG FS:0 - Asymptomatic  Ocular: Sclerae unicteric, pupils  equal and reactive  Ear-nose-throat: Oropharynx clear; no lesions noted; teeth in good repair Lymphatic: No cervical or supraclavicular adenopathy Lungs no rales or rhonchi Heart regular rate and rhythm, no murmur appreciated Abd  soft, nontender, positive bowel sounds, no masses palpated MSK no focal spinal tenderness, no peripheral or joint edema Neuro: non-focal, well-oriented, positive affect Breasts: Deferred  LAB RESULTS:  CMP     Component Value Date/Time   NA 144 02/01/2014 0821   NA 143 12/26/2013 0620   K 3.7 02/01/2014 0821   K 3.7 12/26/2013 0620   CL 101 12/26/2013 0620   CO2 23 02/01/2014 0821   CO2 32 12/26/2013 0620   GLUCOSE 119 02/01/2014 0821   GLUCOSE 92 12/26/2013 0620   BUN 15.5 02/01/2014 0821   BUN 9 12/26/2013 0620   CREATININE 0.7 02/01/2014 0821   CREATININE 0.57 12/26/2013 0620   CREATININE 0.62 11/28/2013 1635   CALCIUM 9.4 02/01/2014 0821   CALCIUM 8.7 12/26/2013 0620   PROT 7.0 02/01/2014 0821   PROT 5.0* 12/21/2013 0450   ALBUMIN 3.7 02/01/2014 0821   ALBUMIN 2.3* 12/21/2013 0450   AST 17 02/01/2014 0821   AST 42* 12/21/2013 0450   ALT 12 02/01/2014 0821   ALT 25 12/21/2013 0450   ALKPHOS 79 02/01/2014 0821   ALKPHOS 46 12/21/2013 0450   BILITOT 0.52 02/01/2014 0821   BILITOT 0.7 12/21/2013 0450   GFRNONAA >90 12/26/2013 0620   GFRAA >90 12/26/2013 0620    I No results found  for this basename: SPEP,  UPEP,   kappa and lambda light chains    Lab Results  Component Value Date   WBC 4.4 02/08/2014   NEUTROABS 2.3 02/08/2014   HGB 11.9 02/08/2014   HCT 35.3 02/08/2014   MCV 84.4 02/08/2014   PLT 434* 02/08/2014   Results for Annette Beltran, Annette Beltran (MRN [id number]) as of 02/15/2014 08:20  Ref. Range 11/29/2013 15:33 01/08/2014 16:13 02/01/2014 08:21  CA 19-9 Latest Range: <35.0 U/mL 47.0 (H) 17.9 25.1     Chemistry      Component Value Date/Time   NA 144 02/01/2014 0821   NA 143 12/26/2013 0620   K 3.7 02/01/2014 0821   K 3.7 12/26/2013 0620   CL 101 12/26/2013 0620   CO2 23 02/01/2014 0821   CO2 32 12/26/2013 0620   BUN 15.5 02/01/2014 0821   BUN 9 12/26/2013 0620   CREATININE 0.7 02/01/2014 0821   CREATININE 0.57 12/26/2013 0620   CREATININE 0.62 11/28/2013 1635      Component Value Date/Time   CALCIUM 9.4 02/01/2014 0821   CALCIUM 8.7 12/26/2013 0620   ALKPHOS 79 02/01/2014 0821   ALKPHOS 46 12/21/2013 0450   AST 17 02/01/2014 0821   AST 42* 12/21/2013 0450   ALT 12 02/01/2014 0821   ALT 25 12/21/2013 0450   BILITOT 0.52 02/01/2014 0821   BILITOT 0.7 12/21/2013 0450       Lab Results  Component Value Date   LABCA2 8 06/08/2011    No components found with  this basename: [id number]    No results found for this basename: INR,  in the last 168 hours  Urinalysis    Component Value Date/Time   COLORURINE YELLOW 06/07/2011 1942   APPEARANCEUR CLEAR 06/07/2011 1942   LABSPEC 1.009 06/07/2011 1942   PHURINE 6.0 06/07/2011 1942   GLUCOSEU NEGATIVE 06/07/2011 1942   HGBUR NEGATIVE 06/07/2011 1942   BILIRUBINUR neg 12/25/2011 [place] 06/07/2011 1942   KETONESUR NEGATIVE 06/07/2011 1942   PROTEINUR neg 12/25/2011 1216   PROTEINUR NEGATIVE 06/07/2011 1942   UROBILINOGEN negative 12/25/2011 1216   UROBILINOGEN 0.2 06/07/2011 1942   NITRITE neg 12/25/2011 1216   NITRITE NEGATIVE 06/07/2011 1942   LEUKOCYTESUR Negative 12/25/2011 1216    STUDIES: No results found.  ASSESSMENT: 68 y.o.  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 noted on abdominal MRI November 2014  PANCREATIC CANCER: (3) status post endoscopic ultrasonography of the pancreas 11/23/2013 with cytology positive for adenocarcinoma, clinical stage T2 NX MX adenocarcinoma associated with a 5.5 cm pseudocyst (which was aspirated); baseline CA 19-9 was 47.0  (4) left pleural effusion status post thoracentesis 12/01/2013, cytology negative  (5) s/plaparoscopic cholecystectomy, open extended distal pancreatectomy, splenectomy, partial gastrectomy x2 12/20/2013 for a pT1 pN0, stage IA invasive ductal adenocarcinoma, grade 2, with positive resection margins  (6) adjuvant gemcitabine started 02/01/2014-- received 3 weekly doses before radiation, will reeive 9 additional doses starting 3-5 weeks after completion of radiation  (7) to receive radiosensitization with capecitabine 1000 mg po BID on radiation days (starts 02/27/2014)  PLAN: Brogan is tolerating her Gemzar fine. She will receive the third of 12 planned doses today, and her treatment will be interrupted until about a month after her she completes her radiation. At that time we will resume the Gemzar and she will receive 9 weekly doses to complete her adjuvant chemotherapy.  So far she has not lost her hair, and I am hopeful she will be able to  keep it. She has been having some mouth sores, although these are not particularly evidence to exam. I am starting her on valacyclovir daily until she is completely done with all her adjuvant treatments.  She is scheduled to start her radiation treatments April 28, and I have arranged for lab work here April 23 and May 5. On may fifth also she has an optional visit with me in case she is having any side effects to discuss. Otherwise she will see me on May 14 as already scheduled.  Ramiya has a very good understanding of the overall plan. She agrees with it. She knows to call for any problems that may develop before her next visit here.  Chauncey Cruel, MD   02/15/2014 8:21 AM

## 2014-02-15 NOTE — Patient Instructions (Signed)
[facility] Discharge Instructions for Patients Receiving Chemotherapy  Today you received the following chemotherapy agents Gemzar.  To help prevent nausea and vomiting after your treatment, we encourage you to take your nausea medication as prescribed.   If you develop nausea and vomiting that is not controlled by your nausea medication, call the clinic.   BELOW ARE SYMPTOMS THAT SHOULD BE REPORTED IMMEDIATELY:  *FEVER GREATER THAN 100.5 F  *CHILLS WITH OR WITHOUT FEVER  NAUSEA AND VOMITING THAT IS NOT CONTROLLED WITH YOUR NAUSEA MEDICATION  *UNUSUAL SHORTNESS OF BREATH  *UNUSUAL BRUISING OR BLEEDING  TENDERNESS IN MOUTH AND THROAT WITH OR WITHOUT PRESENCE OF ULCERS  *URINARY PROBLEMS  *BOWEL PROBLEMS  UNUSUAL RASH Items with * indicate a potential emergency and should be followed up as soon as possible.  Feel free to call the clinic you have any questions or concerns. The clinic phone number is [phone].    

## 2014-02-15 NOTE — Progress Notes (Signed)
Ok to treat with CMET from 2 weeks ago per Dr. Jana Hakim.  Patient complains of burning with urination over the last couple of days, patient denies pain in her abdomen or back, patient denies fever or chills. Patient states she has urgency without urination as well. Dr. Jana Hakim notified. Order given and carried out for a UA.

## 2014-02-16 ENCOUNTER — Telehealth: Payer: Self-pay | Admitting: Oncology

## 2014-02-16 NOTE — Telephone Encounter (Signed)
added appt for f/u and lab on 5/5 per 4/6 pof. per pof lab after visit and visit @ 8am. lmonvm for pt and pt to get new schedule 4/23.

## 2014-02-22 ENCOUNTER — Telehealth: Payer: Self-pay | Admitting: Oncology

## 2014-02-22 ENCOUNTER — Other Ambulatory Visit (HOSPITAL_BASED_OUTPATIENT_CLINIC_OR_DEPARTMENT_OTHER): Payer: Medicare Other

## 2014-02-22 DIAGNOSIS — C252 Malignant neoplasm of tail of pancreas: Secondary | ICD-10-CM

## 2014-02-22 DIAGNOSIS — C251 Malignant neoplasm of body of pancreas: Secondary | ICD-10-CM

## 2014-02-22 LAB — CBC WITH DIFFERENTIAL/PLATELET
BASO%: 1.2 % (ref 0.0–2.0)
BASOS ABS: 0.1 10*3/uL (ref 0.0–0.1)
EOS ABS: 0 10*3/uL (ref 0.0–0.5)
EOS%: 0.7 % (ref 0.0–7.0)
HCT: 33.5 % — ABNORMAL LOW (ref 34.8–46.6)
HGB: 11.5 g/dL — ABNORMAL LOW (ref 11.6–15.9)
LYMPH%: 43.4 % (ref 14.0–49.7)
MCH: 29.2 pg (ref 25.1–34.0)
MCHC: 34.3 g/dL (ref 31.5–36.0)
MCV: 85 fL (ref 79.5–101.0)
MONO#: 0.5 10*3/uL (ref 0.1–0.9)
MONO%: 13.2 % (ref 0.0–14.0)
NEUT%: 41.5 % (ref 38.4–76.8)
NEUTROS ABS: 1.7 10*3/uL (ref 1.5–6.5)
NRBC: 4 % — AB (ref 0–0)
Platelets: 290 10*3/uL (ref 145–400)
RBC: 3.94 10*6/uL (ref 3.70–5.45)
RDW: 16.5 % — ABNORMAL HIGH (ref 11.2–14.5)
WBC: 4 10*3/uL (ref 3.9–10.3)
lymph#: 1.7 10*3/uL (ref 0.9–3.3)

## 2014-02-22 LAB — COMPREHENSIVE METABOLIC PANEL (CC13)
ALBUMIN: 3.6 g/dL (ref 3.5–5.0)
ALK PHOS: 77 U/L (ref 40–150)
ALT: 58 U/L — ABNORMAL HIGH (ref 0–55)
ANION GAP: 11 meq/L (ref 3–11)
AST: 57 U/L — ABNORMAL HIGH (ref 5–34)
BUN: 10 mg/dL (ref 7.0–26.0)
CALCIUM: 9.5 mg/dL (ref 8.4–10.4)
CO2: 23 meq/L (ref 22–29)
Chloride: 107 mEq/L (ref 98–109)
Creatinine: 0.8 mg/dL (ref 0.6–1.1)
GLUCOSE: 176 mg/dL — AB (ref 70–140)
POTASSIUM: 4.2 meq/L (ref 3.5–5.1)
Sodium: 140 mEq/L (ref 136–145)
TOTAL PROTEIN: 6.8 g/dL (ref 6.4–8.3)
Total Bilirubin: 0.32 mg/dL (ref 0.20–1.20)

## 2014-02-22 NOTE — Telephone Encounter (Signed)
, °

## 2014-02-23 LAB — CANCER ANTIGEN 19-9: CA 19-9: 23.8 U/mL (ref <35.0)

## 2014-02-27 ENCOUNTER — Ambulatory Visit
Admission: RE | Admit: 2014-02-27 | Discharge: 2014-02-27 | Disposition: A | Discharge status: Home / Self Care | Payer: Medicare Other | Source: Ambulatory Visit | Attending: Radiation Oncology | Admitting: Radiation Oncology

## 2014-02-27 ENCOUNTER — Encounter: Payer: Self-pay | Admitting: Radiation Oncology

## 2014-02-27 NOTE — Progress Notes (Signed)
Simulation verification note: The patient underwent simulation verification today for treatment to her abdomen/pancreas. She underwent KV-KV imaging. On the PA field there is deviation of the spine to the left, and I requested a repeat film. The multileaf collimators contoured the intended treatment volume appropriately.

## 2014-02-28 ENCOUNTER — Telehealth: Payer: Self-pay | Admitting: Radiation Oncology

## 2014-02-28 ENCOUNTER — Ambulatory Visit
Admission: RE | Admit: 2014-02-28 | Discharge: 2014-02-28 | Disposition: A | Discharge status: Home / Self Care | Payer: Medicare Other | Source: Ambulatory Visit | Attending: Radiation Oncology | Admitting: Radiation Oncology

## 2014-02-28 NOTE — Telephone Encounter (Signed)
Informed by Stanton Kidney, RN that this patient had questions about compazine, zofran, and xeloda. Phoned patient. Patient questions if she should take compazine and zofran even if she doesn't feel nauseated before her temodar. Explained this medication is simply given to have on hand in the event she feels nauseated. Explained the patient would be seen for PUT on Friday with Dr. Tammi Klippel. Patient verbalized understanding and expressed appreciation for the call.

## 2014-03-01 ENCOUNTER — Other Ambulatory Visit: Payer: Medicare Other

## 2014-03-01 ENCOUNTER — Ambulatory Visit
Admission: RE | Admit: 2014-03-01 | Discharge: 2014-03-01 | Disposition: A | Discharge status: Home / Self Care | Payer: Medicare Other | Source: Ambulatory Visit | Attending: Radiation Oncology | Admitting: Radiation Oncology

## 2014-03-01 ENCOUNTER — Other Ambulatory Visit (HOSPITAL_BASED_OUTPATIENT_CLINIC_OR_DEPARTMENT_OTHER): Payer: Medicare Other

## 2014-03-01 DIAGNOSIS — C252 Malignant neoplasm of tail of pancreas: Secondary | ICD-10-CM

## 2014-03-01 DIAGNOSIS — C251 Malignant neoplasm of body of pancreas: Secondary | ICD-10-CM

## 2014-03-01 LAB — CBC WITH DIFFERENTIAL/PLATELET
BASO%: 0.5 % (ref 0.0–2.0)
Basophils Absolute: 0 10*3/uL (ref 0.0–0.1)
EOS ABS: 0.2 10*3/uL (ref 0.0–0.5)
EOS%: 3.6 % (ref 0.0–7.0)
HCT: 35.7 % (ref 34.8–46.6)
HGB: 12.2 g/dL (ref 11.6–15.9)
LYMPH#: 1 10*3/uL (ref 0.9–3.3)
LYMPH%: 15.3 % (ref 14.0–49.7)
MCH: 30.5 pg (ref 25.1–34.0)
MCHC: 34.1 g/dL (ref 31.5–36.0)
MCV: 89.3 fL (ref 79.5–101.0)
MONO#: 1.1 10*3/uL — ABNORMAL HIGH (ref 0.1–0.9)
MONO%: 15.4 % — ABNORMAL HIGH (ref 0.0–14.0)
NEUT%: 65.2 % (ref 38.4–76.8)
NEUTROS ABS: 4.5 10*3/uL (ref 1.5–6.5)
PLATELETS: 498 10*3/uL — AB (ref 145–400)
RBC: 4 10*6/uL (ref 3.70–5.45)
RDW: 18.1 % — AB (ref 11.2–14.5)
WBC: 6.8 10*3/uL (ref 3.9–10.3)

## 2014-03-02 ENCOUNTER — Encounter: Payer: Self-pay | Admitting: Radiation Oncology

## 2014-03-02 ENCOUNTER — Ambulatory Visit
Admission: RE | Admit: 2014-03-02 | Discharge: 2014-03-02 | Disposition: A | Discharge status: Home / Self Care | Payer: Medicare Other | Source: Ambulatory Visit | Attending: Radiation Oncology | Admitting: Radiation Oncology

## 2014-03-02 VITALS — BP 119/41 | HR 52 | Temp 97.6°F | Resp 20 | Wt 153.0 lb

## 2014-03-02 DIAGNOSIS — E440 Moderate protein-calorie malnutrition: Secondary | ICD-10-CM

## 2014-03-02 DIAGNOSIS — C251 Malignant neoplasm of body of pancreas: Secondary | ICD-10-CM

## 2014-03-02 DIAGNOSIS — R609 Edema, unspecified: Secondary | ICD-10-CM

## 2014-03-02 MED ORDER — FUROSEMIDE 20 MG PO TABS: 20.0000 mg | ORAL_TABLET | Freq: Every day | ORAL | Status: DC

## 2014-03-02 NOTE — Progress Notes (Addendum)
  Radiation Oncology         [phone] ________________________________  Name: SANAZ SCARLETT MRN: [id number]  Date: 03/02/2014  DOB: Jun 30, 1946  Weekly Radiation Therapy Management  Current Dose: 7.2 Gy     Planned Dose:  45 Gy  Narrative . . . . . . . . The patient presents for routine under treatment assessment.                                   The patient is without complaint.  She notes modest peripheral edema that she used to use Lasix to treat.                                 Set-up films were reviewed.                                 The chart was checked. Physical Findings. . .  weight is 153 lb (69.4 kg). Her oral temperature is 97.6 F (36.4 C). Her blood pressure is 119/41 and her pulse is 52. Her respiration is 20. . Weight essentially stable.  No significant changes. Impression . . . . . . . The patient is tolerating radiation. Plan . . . . . . . . . . . . Continue treatment as planned.  Given Rx for Lasix 20 mg daily prn swelling, but, cautioned her that probable decreased intake and malabsorption will likely produce some degree of hypovolemia/dehydration in coming weeks, so, use Lasix with great caution.  ________________________________  Sheral Apley Tammi Klippel, M.D.

## 2014-03-02 NOTE — Progress Notes (Addendum)
Pt denies pain, fatigue, loss of appetite. She states she took anti nausea med a few times this past week w/good relief. Pt states she has history of loose bm's due to history of colitis, but she states this week her bowels were not loose. She stopped her Creon per Dr Barry Dienes and her BM's returned to her normal. Pt denies any other issues.  Post sim ed completed w/pt and husband. Gave pt "Radiation and You" booklet w/ all pertinent information marked and discussed, re: diarrhea/management, fatigue, nausea/vomiting/management, nutrition, pain. Pt is seeing nutritionist regularly.Pt and husband verbalized understanding.

## 2014-03-02 NOTE — Addendum Note (Signed)
Encounter addended by: Lora Paula, MD on: 03/02/2014 10:44 AM<BR>     Documentation filed: Notes Section

## 2014-03-05 ENCOUNTER — Ambulatory Visit
Admission: RE | Admit: 2014-03-05 | Discharge: 2014-03-05 | Disposition: A | Discharge status: Home / Self Care | Payer: Medicare Other | Source: Ambulatory Visit | Attending: Radiation Oncology | Admitting: Radiation Oncology

## 2014-03-06 ENCOUNTER — Ambulatory Visit
Admission: RE | Admit: 2014-03-06 | Discharge: 2014-03-06 | Disposition: A | Discharge status: Home / Self Care | Payer: Medicare Other | Source: Ambulatory Visit | Attending: Radiation Oncology | Admitting: Radiation Oncology

## 2014-03-06 ENCOUNTER — Other Ambulatory Visit (HOSPITAL_BASED_OUTPATIENT_CLINIC_OR_DEPARTMENT_OTHER): Payer: Medicare Other

## 2014-03-06 ENCOUNTER — Ambulatory Visit: Payer: Medicare Other | Admitting: Oncology

## 2014-03-06 DIAGNOSIS — C257 Malignant neoplasm of other parts of pancreas: Secondary | ICD-10-CM

## 2014-03-06 DIAGNOSIS — C251 Malignant neoplasm of body of pancreas: Secondary | ICD-10-CM

## 2014-03-06 LAB — CBC WITH DIFFERENTIAL/PLATELET
BASO%: 0.9 % (ref 0.0–2.0)
Basophils Absolute: 0.1 10*3/uL (ref 0.0–0.1)
EOS ABS: 0.2 10*3/uL (ref 0.0–0.5)
EOS%: 4.1 % (ref 0.0–7.0)
HEMATOCRIT: 36.8 % (ref 34.8–46.6)
HEMOGLOBIN: 12.5 g/dL (ref 11.6–15.9)
LYMPH%: 17.2 % (ref 14.0–49.7)
MCH: 29.9 pg (ref 25.1–34.0)
MCHC: 34 g/dL (ref 31.5–36.0)
MCV: 88 fL (ref 79.5–101.0)
MONO#: 0.8 10*3/uL (ref 0.1–0.9)
MONO%: 14.7 % — ABNORMAL HIGH (ref 0.0–14.0)
NEUT%: 63.1 % (ref 38.4–76.8)
NEUTROS ABS: 3.5 10*3/uL (ref 1.5–6.5)
PLATELETS: 751 10*3/uL — AB (ref 145–400)
RBC: 4.18 10*6/uL (ref 3.70–5.45)
RDW: 20.2 % — ABNORMAL HIGH (ref 11.2–14.5)
WBC: 5.6 10*3/uL (ref 3.9–10.3)
lymph#: 1 10*3/uL (ref 0.9–3.3)

## 2014-03-07 ENCOUNTER — Telehealth: Payer: Self-pay | Admitting: Oncology

## 2014-03-07 ENCOUNTER — Encounter: Payer: Self-pay | Admitting: *Deleted

## 2014-03-07 ENCOUNTER — Ambulatory Visit
Admission: RE | Admit: 2014-03-07 | Discharge: 2014-03-07 | Disposition: A | Discharge status: Home / Self Care | Payer: Medicare Other | Source: Ambulatory Visit | Attending: Radiation Oncology | Admitting: Radiation Oncology

## 2014-03-07 NOTE — Telephone Encounter (Signed)
, °

## 2014-03-08 ENCOUNTER — Ambulatory Visit
Admission: RE | Admit: 2014-03-08 | Discharge: 2014-03-08 | Disposition: A | Discharge status: Home / Self Care | Payer: Medicare Other | Source: Ambulatory Visit | Attending: Radiation Oncology | Admitting: Radiation Oncology

## 2014-03-08 ENCOUNTER — Other Ambulatory Visit: Payer: Medicare Other

## 2014-03-08 ENCOUNTER — Encounter: Payer: Self-pay | Admitting: Radiation Oncology

## 2014-03-08 VITALS — BP 107/62 | HR 91 | Resp 16 | Wt 149.2 lb

## 2014-03-08 DIAGNOSIS — C251 Malignant neoplasm of body of pancreas: Secondary | ICD-10-CM

## 2014-03-08 NOTE — Progress Notes (Signed)
  Radiation Oncology         [phone] ________________________________  Name: Annette Beltran MRN: [id number]  Date: 03/08/2014  DOB: [birth year]    Weekly Radiation Therapy Management  Current Dose: 14.4 Gy     Planned Dose:  45 Gy  Narrative . . . . . . . . The patient presents for routine under treatment assessment.                                  Denies nausea or vomiting. Reports taking zofran 20 minutes prior to xeloda. States, "my tummy just feels yucky." Patient has lost 4 lb in one week but, reports her appetite has been "too good." reports she has been a lot more active this week walking and such. States, "i feel better this week than last." Reports only a few episodes of diarrhea this week. Vitals stable. Denies pain                                 Set-up films were reviewed.                                 The chart was checked. Physical Findings. . .  weight is 149 lb 3.2 oz (67.677 kg). Her blood pressure is 107/62 and her pulse is 91. Her respiration is 16. . Weight essentially stable.  No significant changes. Impression . . . . . . . The patient is tolerating radiation. Plan . . . . . . . . . . . . Continue treatment as planned.  ________________________________  Sheral Apley. Tammi Klippel, M.D.

## 2014-03-08 NOTE — Progress Notes (Signed)
Denies nausea or vomiting. Reports taking zofran 20 minutes prior to xeloda. States, "my tummy just feels yucky." Patient has lost 4 lb in one week but, reports her appetite has been "too good." reports she has been a lot more active this week walking and such. States, "i feel better this week than last." Reports only a few episodes of diarrhea this week. Vitals stable. Denies pain.

## 2014-03-09 ENCOUNTER — Ambulatory Visit
Admission: RE | Admit: 2014-03-09 | Discharge: 2014-03-09 | Disposition: A | Discharge status: Home / Self Care | Payer: Medicare Other | Source: Ambulatory Visit | Attending: Radiation Oncology | Admitting: Radiation Oncology

## 2014-03-12 ENCOUNTER — Ambulatory Visit
Admission: RE | Admit: 2014-03-12 | Discharge: 2014-03-12 | Disposition: A | Discharge status: Home / Self Care | Payer: Medicare Other | Source: Ambulatory Visit | Attending: Radiation Oncology | Admitting: Radiation Oncology

## 2014-03-13 ENCOUNTER — Ambulatory Visit
Admission: RE | Admit: 2014-03-13 | Discharge: 2014-03-13 | Disposition: A | Discharge status: Home / Self Care | Payer: Medicare Other | Source: Ambulatory Visit | Attending: Radiation Oncology | Admitting: Radiation Oncology

## 2014-03-14 ENCOUNTER — Ambulatory Visit
Admission: RE | Admit: 2014-03-14 | Discharge: 2014-03-14 | Disposition: A | Discharge status: Home / Self Care | Payer: Medicare Other | Source: Ambulatory Visit | Attending: Radiation Oncology | Admitting: Radiation Oncology

## 2014-03-15 ENCOUNTER — Ambulatory Visit
Admission: RE | Admit: 2014-03-15 | Discharge: 2014-03-15 | Disposition: A | Discharge status: Home / Self Care | Payer: Medicare Other | Source: Ambulatory Visit | Attending: Radiation Oncology | Admitting: Radiation Oncology

## 2014-03-15 ENCOUNTER — Ambulatory Visit (HOSPITAL_BASED_OUTPATIENT_CLINIC_OR_DEPARTMENT_OTHER): Payer: Medicare Other | Admitting: Oncology

## 2014-03-15 ENCOUNTER — Other Ambulatory Visit (HOSPITAL_BASED_OUTPATIENT_CLINIC_OR_DEPARTMENT_OTHER): Payer: Medicare Other

## 2014-03-15 VITALS — BP 116/67 | HR 48 | Temp 98.4°F | Resp 18 | Ht 64.0 in | Wt 152.0 lb

## 2014-03-15 DIAGNOSIS — J90 Pleural effusion, not elsewhere classified: Secondary | ICD-10-CM

## 2014-03-15 DIAGNOSIS — C251 Malignant neoplasm of body of pancreas: Secondary | ICD-10-CM

## 2014-03-15 DIAGNOSIS — Z853 Personal history of malignant neoplasm of breast: Secondary | ICD-10-CM

## 2014-03-15 DIAGNOSIS — C50911 Malignant neoplasm of unspecified site of right female breast: Secondary | ICD-10-CM

## 2014-03-15 DIAGNOSIS — D7389 Other diseases of spleen: Secondary | ICD-10-CM

## 2014-03-15 DIAGNOSIS — K52831 Collagenous colitis: Secondary | ICD-10-CM

## 2014-03-15 DIAGNOSIS — I700 Atherosclerosis of aorta: Secondary | ICD-10-CM

## 2014-03-15 DIAGNOSIS — C252 Malignant neoplasm of tail of pancreas: Secondary | ICD-10-CM

## 2014-03-15 DIAGNOSIS — D735 Infarction of spleen: Secondary | ICD-10-CM

## 2014-03-15 DIAGNOSIS — M8580 Other specified disorders of bone density and structure, unspecified site: Secondary | ICD-10-CM

## 2014-03-15 DIAGNOSIS — E440 Moderate protein-calorie malnutrition: Secondary | ICD-10-CM

## 2014-03-15 LAB — CBC WITH DIFFERENTIAL/PLATELET
BASO%: 1.1 % (ref 0.0–2.0)
BASOS ABS: 0.1 10*3/uL (ref 0.0–0.1)
EOS%: 3.8 % (ref 0.0–7.0)
Eosinophils Absolute: 0.3 10*3/uL (ref 0.0–0.5)
HEMATOCRIT: 35.9 % (ref 34.8–46.6)
HGB: 12.7 g/dL (ref 11.6–15.9)
LYMPH#: 0.8 10*3/uL — AB (ref 0.9–3.3)
LYMPH%: 11 % — ABNORMAL LOW (ref 14.0–49.7)
MCH: 31.1 pg (ref 25.1–34.0)
MCHC: 35.4 g/dL (ref 31.5–36.0)
MCV: 87.8 fL (ref 79.5–101.0)
MONO#: 1.1 10*3/uL — AB (ref 0.1–0.9)
MONO%: 15.6 % — ABNORMAL HIGH (ref 0.0–14.0)
NEUT#: 4.9 10*3/uL (ref 1.5–6.5)
NEUT%: 68.5 % (ref 38.4–76.8)
Platelets: 362 10*3/uL (ref 145–400)
RBC: 4.09 10*6/uL (ref 3.70–5.45)
RDW: 21.2 % — ABNORMAL HIGH (ref 11.2–14.5)
WBC: 7.2 10*3/uL (ref 3.9–10.3)

## 2014-03-15 LAB — COMPREHENSIVE METABOLIC PANEL (CC13)
ALBUMIN: 3.5 g/dL (ref 3.5–5.0)
ALT: 14 U/L (ref 0–55)
AST: 16 U/L (ref 5–34)
Alkaline Phosphatase: 69 U/L (ref 40–150)
Anion Gap: 11 mEq/L (ref 3–11)
BUN: 14.3 mg/dL (ref 7.0–26.0)
CHLORIDE: 104 meq/L (ref 98–109)
CO2: 22 mEq/L (ref 22–29)
CREATININE: 0.7 mg/dL (ref 0.6–1.1)
Calcium: 9.3 mg/dL (ref 8.4–10.4)
GLUCOSE: 201 mg/dL — AB (ref 70–140)
POTASSIUM: 3.9 meq/L (ref 3.5–5.1)
Sodium: 137 mEq/L (ref 136–145)
Total Bilirubin: 0.36 mg/dL (ref 0.20–1.20)
Total Protein: 6.8 g/dL (ref 6.4–8.3)

## 2014-03-15 NOTE — Progress Notes (Signed)
[place]  [phone] [phone]     ID: DOMINIQUE RESSEL OB: 70/19/1947  MR#: [id number]  WLN#:[id number]  PCP: [name], MD GYN:   SU: [name] OTHER MD: Delfin Edis, [name]  CHIEF COMPLAINT: "I'm doing OK with radiation"  PANCREATIC CANCER HISTORY: I formerly followed [name] for a stage I breast cancer, which was treated with surgery, radiation, and anti-estrogens. She was released from followup here in 2007. In addition, in 2012 she had abdominal pain leading to hospitalization. 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 The patient also has a history of collagenous colitis, which of course carries its own set of symptoms, making identification of her new attic problem more difficult.  Laelynn tells me she started "feeling bad" in September of 2014. There was abdominal discomfort localizing to the left upper o'clock on, worse with inspiration. CT angiography 08/21/2013 showed no clot and no significant abnormalities. Plain rib and left shoulder views were negative. CT abdomen and pelvis with contrast 08/22/2013 showed slight prominence of the pancreatic duct in the distal body and tail with a new small structure emanating from the tail of the pancreas consistent with a pseudocyst. Exudate near the tail of the pancreas suggested pancreatitis. MRI of the abdomen 09/03/2013 confirmed several ill-defined fluid collections between the gastric fundus and the splenic hilum surrounding the pancreatic tail. There was no evidence of a pancreatic mass. The pancreatic tail did enhanced following contrast. The splenic vein appeared chronically thrombosed with a small amount of nonocclusive thrombus extending into the main and left portal veins.  On  09/26/2013 amylase was 237 and lipase 513.0, consistent with acute pancreatitis. These numbers subsequently dropped in on 11/28/2013 amylase was normal at 47 and lipase was mildly elevated at 110  With continuing left-sided abdominal discomfort, abdominal ultrasound 11/13/2013 showed multiple gallstones with no evidence of cholecystitis. Evaluation of the pancreas showed a hypoechoic lobulated focus measuring up to 7.4 cm felt to be nonspecific. Repeat abdominal CT 11/14/2013 showed diffuse pancreatic atrophy without evidence of a mass, again consistent with chronic pancreatitis. There was mild soft tissue stranding suspicious for mild superimposed acute pancreatitis. A new rim-enhancing fluid collection was noted in the gastrosplenic ligament measuring 4.5 cm, consistent with a pseudocyst. Finally on 11/23/2013, endoscopic ultrasonography under Oretha Caprice showed a subtle 2.5 cm soft tissue mass in the mid-pancreas, abutting the splenic vein. This was biopsied x2, with cytology (NZB [id number]) showing adenocarcinoma. A peripancreatic fluid collection measuring over 5 cm was aspirated yielding 25 cc of chocolate colored fluid. Two or three scattered round hypoechoic lymph nodes were noted near the pancreatic tail but were not sampled.  In addition, on 11/28/2013 a chest x-ray obtained to evaluate upper respiratory symptoms showed a small to moderate left effusion. Chest CT scan 11/29/2013 confirmed a moderate left-sided pleural effusion with no suspicious appearing pulmonary nodules or masses associated with it. The complex cystic lesion associated with the tail of the pancreas was again noted, measuring 5.5 cm despite the recent aspiration procedure.  The patient's subsequent history is as detailed below.  INTERVAL HISTORY: Kenley returns today with her husband [name] for followup of her pancreatic cancer. 68 Since her last visit here she started radiation treatments, with the first dose given April 70. She also  takes capecitabine 1000 mg by  mouth twice a day on radiation days. If there are no delays her radiation treatments will conclude on 04/06/2049.  REVIEW OF SYSTEMS: Mahalia is feeling a bit despondent. She feels fatigued. She has some nausea. She has occasional diarrhea (was constipated before). Perhaps every other day she will have 3 liquid yellowish stools in a row. There has been no blood. She denies cramps or fever. She is taking naps, which don't really makes her feel refreshed. In the evening. She has nocturia x1, which is not a new problem. She is concerned whether she will be able to tolerate the rest of the radiation treatments and a subsequent chemotherapy. A detailed review of systems today was otherwise stable  PAST MEDICAL HISTORY: Past Medical History  Diagnosis Date  . Macular degeneration   . Fluid retention   . Colitis, collagenous   . Vitamin D deficiency   . Osteopenia   . Celiac artery stenosis   . Splenic infarction   . Arthritis   . History of blood clots   . Anxiety   . Shingles   . Pancreatitis   . Osteoporosis   . Clotting disorder   . Breast cancer     rt lumpectomy  . Arrhythmia     "skips a beat"  [place]  heart  . GERD (gastroesophageal reflux disease)     PAST SURGICAL HISTORY: Past Surgical History  Procedure Laterality Date  . Foot surgery  2003    x 3, 2 on right, 1 on left  . Breast lumpectomy Right     radiation for 6 weeks  . Meniscus repair Right   . Eus N/A 11/23/2013    Procedure: UPPER ENDOSCOPIC ULTRASOUND (EUS) LINEAR;  Surgeon: Milus Banister, MD;  Location: [facility];  Service: Endoscopy;  Laterality: N/A;  . Tonsillectomy    . Laparoscopy N/A 12/20/2013    Procedure: LAPAROSCOPY DIAGNOSTIC ;  Surgeon: [name], MD;  Location: [place];  Service: General;  Laterality: N/A;  . Cholecystectomy N/A 12/20/2013    Procedure: LAPAROSCOPIC CHOLECYSTECTOMY;  Surgeon: [name], MD;  Location: [place];  Service: General;  Laterality: N/A;   . Splenectomy, total N/A 12/20/2013    Procedure: SPLENECTOMY;  Surgeon: [name], MD;  Location: [place];  Service: General;  Laterality: N/A;  . Partial gastrectomy N/A 12/20/2013    Procedure: PARTIAL GASTRECTOMY;  Surgeon: [name], MD;  Location: [place];  Service: General;  Laterality: N/A;  . Portacath placement N/A 01/15/2014    Procedure: INSERTION PORT-A-CATH;  Surgeon: [name], MD;  Location: [facility];  Service: General;  Laterality: N/A;    FAMILY HISTORY Family History  Problem Relation Age of Onset  . Colon cancer Neg Hx   . Aneurysm Mother   . Stroke Mother   . Prostate cancer Father 45  . Skin cancer Brother 59    treated with interferon for 1 year  . Heart failure Paternal Aunt   . Breast cancer Paternal Aunt     dx over 61s  . Pancreatic cancer Maternal Grandmother     dx in her 40s  . Breast cancer Maternal Aunt     dx over 66  . Rheum arthritis Brother   . Breast cancer Paternal Aunt     dx over 1s  . Leukemia Paternal Aunt    the patient's father had a diagnosis of prostate cancer metastatic to the liver or a died at age 18. The patient's mother died secondary to carotid third brain  aneurysm at the age of 71. The patient had 2 brothers, no sisters. One brother has a history of melanoma. The patient's mother is mother was diagnosed with pancreatic cancer in her 96s. There is a history of breast cancer and second degree relatives, none before the age of 58  GYNECOLOGIC HISTORY:  Menarche age 39, first live birth age 87, the patient is [place] P2.. Stopped in her early 52s. 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 Her husband of 68 years, [name], retired from [employer] and currently works part-time as a Cabin crew with [employer]. Daughter Marita [place] teaches first grade. Daughter Leafy Ro is a homemaker. The patient has 4 grandchildren [name]  has an additional 5 grandchildren of his own). The patient attends a [employer]    ADVANCED DIRECTIVES: In place   HEALTH MAINTENANCE: History  Substance Use Topics  . Smoking status: Former Smoker -- 0.50 packs/day for 28 years    Types: Cigarettes    Quit date: 11/02/2000  . Smokeless tobacco: Never Used  . Alcohol Use: Yes     Comment: occ wine      Colonoscopy:  PAP:  Bone density:  Lipid panel:  Mammography:  Allergies  Allergen Reactions  . Codeine Nausea And Vomiting  . Lactose Intolerance (Gi)   . Tequin     Severe stomach pain  . Penicillins Nausea Only and Rash    Current Outpatient Prescriptions  Medication Sig Dispense Refill  . ALPRAZolam (XANAX) 0.5 MG tablet Take 1 tablet (0.5 mg total) by mouth 2 (two) times daily as needed for anxiety.  60 tablet  5  . Ascorbic Acid (VITAMIN C PO) Take 1 tablet by mouth 2 (two) times daily.      Marland Kitchen aspirin 81 MG tablet Take 81 mg by mouth daily.      Marland Kitchen b complex vitamins tablet Take 1 tablet by mouth daily.      . capecitabine (XELODA) 500 MG tablet Take two tablets twice daily on radiation days  120 tablet  0  . diphenhydrAMINE (BENADRYL) 25 MG tablet Take 25 mg by mouth at bedtime.       Marland Kitchen esomeprazole (NEXIUM) 20 MG capsule Take 20 mg by mouth daily at 12 noon.      . feeding supplement, RESOURCE BREEZE, (RESOURCE BREEZE) LIQD Take 1 Container by mouth 2 (two) times daily between meals.  30 Container  3  . furosemide (LASIX) 20 MG tablet Take 1 tablet (20 mg total) by mouth daily. Prn swelling  15 tablet  5  . lidocaine-prilocaine (EMLA) cream Apply 1-2 hours to port a cath before procedure.  30 g  6  . lipase/protease/amylase (CREON-12/PANCREASE) 12000 UNITS CPEP capsule Take 2 capsules by mouth 3 (three) times daily with meals.  270 capsule  3  . loratadine (CLARITIN) 10 MG tablet Take 10 mg by mouth daily.      Marland Kitchen LORazepam (ATIVAN) 0.5 MG tablet Take 1 tablet (0.5 mg total) by mouth every 6 (six) hours as  needed (Nausea or vomiting).  30 tablet  0  . naproxen sodium (ANAPROX) 220 MG tablet Take 220 mg by mouth 2 (two) times daily as needed (pain).       . ondansetron (ZOFRAN) 8 MG tablet Take 1 tablet (8 mg total) by mouth 2 (two) times daily as needed (Nausea or vomiting).  30 tablet  1  . PARoxetine (PAXIL) 10 MG tablet Take 1 tablet (10 mg total) by mouth daily.  30 tablet  5  . prochlorperazine (COMPAZINE) 10 MG tablet Take 1 tablet (10 mg total) by mouth every 6 (six) hours as needed (Nausea or vomiting).  30 tablet  1  . valACYclovir (VALTREX) 1000 MG tablet Take 1 tablet (1,000 mg total) by mouth 2 (two) times daily.  90 tablet  1   No current facility-administered medications for this visit.    OBJECTIVE: Middle-aged white woman who appears stated age 52 Vitals:   03/15/14 1550  BP: 116/67  Pulse: 48  Temp: 98.4 F (36.9 C)  Resp: 18     Body mass index is 26.08 kg/(m^2).    ECOG FS:1 - Symptomatic but completely ambulatory  Ocular: Sclerae unicteric, EOMs intact Ear-nose-throat: Oropharynx clear; no thrush or other lesions noted Lymphatic: No cervical or supraclavicular adenopathy Lungs no rales or rhonchi Heart regular rate and rhythm Abd  soft, nontender to palpation in all quadrants, positive bowel sounds, no masses palpated MSK no focal spinal tenderness, no  joint edema Neuro: non-focal, well-oriented, guarded affect Breasts: Deferred  LAB RESULTS:  CMP     Component Value Date/Time   NA 137 03/15/2014 1459   NA 143 12/26/2013 0620   K 3.9 03/15/2014 1459   K 3.7 12/26/2013 0620   CL 101 12/26/2013 0620   CO2 22 03/15/2014 1459   CO2 32 12/26/2013 0620   GLUCOSE 201* 03/15/2014 1459   GLUCOSE 92 12/26/2013 0620   BUN 14.3 03/15/2014 1459   BUN 9 12/26/2013 0620   CREATININE 0.7 03/15/2014 1459   CREATININE 0.57 12/26/2013 0620   CREATININE 0.62 11/28/2013 1635   CALCIUM 9.3 03/15/2014 1459   CALCIUM 8.7 12/26/2013 0620   PROT 6.8 03/15/2014 1459   PROT 5.0*  12/21/2013 0450   ALBUMIN 3.5 03/15/2014 1459   ALBUMIN 2.3* 12/21/2013 0450   AST 16 03/15/2014 1459   AST 42* 12/21/2013 0450   ALT 14 03/15/2014 1459   ALT 25 12/21/2013 0450   ALKPHOS 69 03/15/2014 1459   ALKPHOS 46 12/21/2013 0450   BILITOT 0.36 03/15/2014 1459   BILITOT 0.7 12/21/2013 0450   GFRNONAA >90 12/26/2013 0620   GFRAA >90 12/26/2013 0620   Results for KANOE, WANNER (MRN [id number]) as of 03/17/2014 09:43  Ref. Range 11/29/2013 15:33 01/08/2014 16:13 02/01/2014 08:21 02/22/2014 13:48 03/15/2014 14:59  CA 19-9 Latest Range: <35.0 U/mL 47.0 (H) 17.9 25.1 23.8 26.8   I No results found for this basename: SPEP,  UPEP,   kappa and lambda light chains    Lab Results  Component Value Date   WBC 7.2 03/15/2014   NEUTROABS 4.9 03/15/2014   HGB 12.7 03/15/2014   HCT 35.9 03/15/2014   MCV 87.8 03/15/2014   PLT 362 03/15/2014      Chemistry      Component Value Date/Time   NA 137 03/15/2014 1459   NA 143 12/26/2013 0620   K 3.9 03/15/2014 1459   K 3.7 12/26/2013 0620   CL 101 12/26/2013 0620   CO2 22 03/15/2014 1459   CO2 32 12/26/2013 0620   BUN 14.3 03/15/2014 1459   BUN 9 12/26/2013 0620   CREATININE 0.7 03/15/2014 1459   CREATININE 0.57 12/26/2013 0620   CREATININE 0.62 11/28/2013 1635      Component Value Date/Time   CALCIUM 9.3 03/15/2014 1459   CALCIUM 8.7 12/26/2013 0620   ALKPHOS  69 03/15/2014 1459   ALKPHOS 46 12/21/2013 0450   AST 16 03/15/2014 1459   AST 42* 12/21/2013 0450   ALT 14 03/15/2014 1459   ALT 25 12/21/2013 0450   BILITOT 0.36 03/15/2014 1459   BILITOT 0.7 12/21/2013 0450       Lab Results  Component Value Date   LABCA2 8 06/08/2011    No components found with this basename: [id number]    No results found for this basename: INR,  in the last 168 hours  Urinalysis    Component Value Date/Time   COLORURINE YELLOW 06/07/2011 1942   APPEARANCEUR CLEAR 06/07/2011 1942   LABSPEC 1.010 02/15/2014 1109   LABSPEC 1.009 06/07/2011 1942   PHURINE 6.0 06/07/2011 1942   GLUCOSEU  Negative 02/15/2014 [place] 06/07/2011 1942   HGBUR NEGATIVE 06/07/2011 1942   BILIRUBINUR neg 12/25/2011 [place] 06/07/2011 1942   KETONESUR NEGATIVE 06/07/2011 1942   PROTEINUR neg 12/25/2011 1216   PROTEINUR NEGATIVE 06/07/2011 1942   UROBILINOGEN 0.2 02/15/2014 1109   UROBILINOGEN negative 12/25/2011 1216   UROBILINOGEN 0.2 06/07/2011 1942   NITRITE neg 12/25/2011 1216   NITRITE NEGATIVE 06/07/2011 1942   LEUKOCYTESUR Negative 12/25/2011 1216    STUDIES: No results found.  ASSESSMENT: 68 y.o. 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 noted on abdominal MRI November 2014  PANCREATIC CANCER: (3) status post endoscopic ultrasonography of the pancreas 11/23/2013 with cytology positive for adenocarcinoma, clinical stage T2 NX MX adenocarcinoma associated with a 5.5 cm pseudocyst (which was aspirated); baseline CA 19-9 was 47.0  (4) left pleural effusion status post thoracentesis 12/01/2013, cytology negative  (5) s/p laparoscopic cholecystectomy, open extended distal pancreatectomy, splenectomy, partial gastrectomy x2 12/20/2013 for a pT1 pN0, stage IA invasive ductal adenocarcinoma, grade 2, with positive resection margins  (6) adjuvant gemcitabine started 02/01/2014-- received 3 weekly doses before radiation, will reeive 9 additional doses  starting 3-5 weeks after completion of radiation  (7) adjuvant radiation started 02/27/2014, with radiosensitization (capecitabine 1000 mg po BID on radiation days); radiation to continue through 04/06/2014 as currently scheduled  (8) adjuvant gemcitabine weekly to be resumed 05/07/2014,, with 9 additional doses planned  PLAN: We spent approximately 45 minutes today reviewing Jamariah's overall situation, and making plans for the remaining adjuvant treatment. Batina was concerned she might not be able to tolerate the planned therapy. I have urged her to walk twice a day for a total of at least 40 minutes, perhaps 20 in the morning and 20 in the evening. I think that will help her energy more than naps, which probably will only make her feel more tired. She understands that fatigue is likely to get worse before it gets better, and it will not rapidly resolve after radiation--it frequently takes a couple of months for patients to start feeling more normal. She is having remarkably few GI symptoms at present.  If the diarrhea becomes more of an issue she will start Imodium and of course she will let us know.   There is an important wedding 06/22/2014 and in addition she has to do two "shows" in her home by October. The protocol states that gemcitabine is to be resumed 3-5 weeks after completion of radiation. We decided to start on July 6. She would like early morning appointments and we will try to find those for her.  Obdulia has a good understanding of the overall plan. She agrees with it. She knows the goal of treatment is cure. She will call with any problems that may develop before her next visit here.  Chauncey Cruel, MD   03/15/2014 4:06 PM

## 2014-03-16 ENCOUNTER — Encounter: Payer: Self-pay | Admitting: Radiation Oncology

## 2014-03-16 ENCOUNTER — Ambulatory Visit
Admission: RE | Admit: 2014-03-16 | Discharge: 2014-03-16 | Disposition: A | Discharge status: Home / Self Care | Payer: Medicare Other | Source: Ambulatory Visit | Attending: Radiation Oncology | Admitting: Radiation Oncology

## 2014-03-16 VITALS — BP 111/60 | HR 50 | Resp 16 | Wt 150.0 lb

## 2014-03-16 DIAGNOSIS — C251 Malignant neoplasm of body of pancreas: Secondary | ICD-10-CM

## 2014-03-16 LAB — CANCER ANTIGEN 19-9: CA 19-9: 26.8 U/mL (ref <35.0)

## 2014-03-16 NOTE — Progress Notes (Signed)
  Radiation Oncology         (336) [phone] ________________________________  Name: Annette Beltran MRN: [id number]  Date: 03/16/2014  DOB: 1946-09-29   Weekly Radiation Therapy Management  Current Dose: 25.2 Gy     Planned Dose:  50.4 Gy  Narrative . . . . . . . . The patient presents for routine under treatment assessment.                                   Reports she feels better today than yesterday. She reports that yesterday she felt tired and lethargic. Reports mild nausea but, denies vomiting. Denies pain. Reports her appetite is OK. Reports one episode of diarrhea per day. Report sleeping without difficulty. Reports walking 20 minutes twice a day with husband                                 Set-up films were reviewed.                                 The chart was checked. Physical Findings. . .  weight is 150 lb (68.04 kg). Her blood pressure is 111/60 and her pulse is 50. Her respiration is 16. . Weight essentially stable.  No significant changes. Impression . . . . . . . The patient is tolerating radiation. Plan . . . . . . . . . . . . Continue treatment as planned.  ________________________________  Sheral Apley. Tammi Klippel, M.D.

## 2014-03-16 NOTE — Progress Notes (Signed)
Reports she feels better today than yesterday. She reports that yesterday she felt tired and lethargic. Reports mild nausea but, denies vomiting. Denies pain. Reports her appetite is OK. Reports one episode of diarrhea per day. Report sleeping without difficulty. Reports walking 20 minutes twice a day with husband.

## 2014-03-18 ENCOUNTER — Telehealth: Payer: Self-pay | Admitting: Oncology

## 2014-03-18 NOTE — Telephone Encounter (Signed)
s.w. pt and advised on July appt....pt ok and aware °

## 2014-03-19 ENCOUNTER — Ambulatory Visit
Admission: RE | Admit: 2014-03-19 | Discharge: 2014-03-19 | Disposition: A | Discharge status: Home / Self Care | Payer: Medicare Other | Source: Ambulatory Visit | Attending: Radiation Oncology | Admitting: Radiation Oncology

## 2014-03-20 ENCOUNTER — Ambulatory Visit
Admission: RE | Admit: 2014-03-20 | Discharge: 2014-03-20 | Disposition: A | Discharge status: Home / Self Care | Payer: Medicare Other | Source: Ambulatory Visit | Attending: Radiation Oncology | Admitting: Radiation Oncology

## 2014-03-20 DIAGNOSIS — C251 Malignant neoplasm of body of pancreas: Secondary | ICD-10-CM

## 2014-03-20 MED ORDER — RADIAPLEXRX EX GEL
Freq: Once | CUTANEOUS | Status: AC
  Administered 2014-03-20: 10:00:00 via TOPICAL

## 2014-03-20 NOTE — Progress Notes (Signed)
Received call from therapist that patient's scar has turned slightly pink. Provided patient with radiaplex gel and directed upon use. Patient reports that each morning when she wakes up and turns her head side to side on her pillow she feels so dizzy she is unsure if she will get able to get out of bed. Denies diplopia, floaters or ringing in the ears is associated with this. Explained this Probation officer would report these findings to Dr. Tammi Klippel and advise accordingly.

## 2014-03-21 ENCOUNTER — Ambulatory Visit
Admission: RE | Admit: 2014-03-21 | Discharge: 2014-03-21 | Disposition: A | Discharge status: Home / Self Care | Payer: Medicare Other | Source: Ambulatory Visit | Attending: Radiation Oncology | Admitting: Radiation Oncology

## 2014-03-22 ENCOUNTER — Other Ambulatory Visit (HOSPITAL_BASED_OUTPATIENT_CLINIC_OR_DEPARTMENT_OTHER): Payer: Medicare Other

## 2014-03-22 ENCOUNTER — Ambulatory Visit
Admission: RE | Admit: 2014-03-22 | Discharge: 2014-03-22 | Disposition: A | Discharge status: Home / Self Care | Payer: Medicare Other | Source: Ambulatory Visit | Attending: Radiation Oncology | Admitting: Radiation Oncology

## 2014-03-22 DIAGNOSIS — C252 Malignant neoplasm of tail of pancreas: Secondary | ICD-10-CM

## 2014-03-22 DIAGNOSIS — C251 Malignant neoplasm of body of pancreas: Secondary | ICD-10-CM

## 2014-03-22 LAB — CBC WITH DIFFERENTIAL/PLATELET
BASO%: 1.1 % (ref 0.0–2.0)
Basophils Absolute: 0.1 10*3/uL (ref 0.0–0.1)
EOS%: 6.1 % (ref 0.0–7.0)
Eosinophils Absolute: 0.5 10*3/uL (ref 0.0–0.5)
HCT: 37.8 % (ref 34.8–46.6)
HEMOGLOBIN: 13.2 g/dL (ref 11.6–15.9)
LYMPH%: 7.3 % — ABNORMAL LOW (ref 14.0–49.7)
MCH: 30.9 pg (ref 25.1–34.0)
MCHC: 34.9 g/dL (ref 31.5–36.0)
MCV: 88.5 fL (ref 79.5–101.0)
MONO#: 1.2 10*3/uL — ABNORMAL HIGH (ref 0.1–0.9)
MONO%: 16.6 % — AB (ref 0.0–14.0)
NEUT#: 5.2 10*3/uL (ref 1.5–6.5)
NEUT%: 68.9 % (ref 38.4–76.8)
NRBC: 0 % (ref 0–0)
Platelets: 235 10*3/uL (ref 145–400)
RBC: 4.27 10*6/uL (ref 3.70–5.45)
RDW: 22 % — AB (ref 11.2–14.5)
WBC: 7.5 10*3/uL (ref 3.9–10.3)
lymph#: 0.6 10*3/uL — ABNORMAL LOW (ref 0.9–3.3)

## 2014-03-23 ENCOUNTER — Ambulatory Visit
Admission: RE | Admit: 2014-03-23 | Discharge: 2014-03-23 | Disposition: A | Discharge status: Home / Self Care | Payer: Medicare Other | Source: Ambulatory Visit | Attending: Radiation Oncology | Admitting: Radiation Oncology

## 2014-03-23 ENCOUNTER — Encounter: Payer: Self-pay | Admitting: Radiation Oncology

## 2014-03-23 VITALS — BP 97/59 | HR 47 | Resp 16 | Wt 150.4 lb

## 2014-03-23 DIAGNOSIS — C251 Malignant neoplasm of body of pancreas: Secondary | ICD-10-CM

## 2014-03-23 NOTE — Progress Notes (Signed)
Reports she continues to use radiaplex as directed on abdominal area and scar bid. Reports dizziness has resolved with increased fluid intake. BP low. Reports taking zofran before xeloda. Denies nausea, vomiting, headache, dizziness, or diarrhea. Weight stable.

## 2014-03-23 NOTE — Progress Notes (Signed)
  Radiation Oncology         (336) [phone] ________________________________  Name: Annette Beltran MRN: [id number]  Date: 03/23/2014  DOB: 19-May-1946   Weekly Radiation Therapy Management  Current Dose: 34.2 Gy     Planned Dose:  50.4 Gy  Narrative . . . . . . . . The patient presents for routine under treatment assessment.                                   Reports she continues to use radiaplex as directed on abdominal area and scar bid. Reports dizziness has resolved with increased fluid intake. BP low. Reports taking zofran before xeloda. Denies nausea, vomiting, headache, dizziness, or diarrhea. Weight stable                                 Set-up films were reviewed.                                 The chart was checked. Physical Findings. . .  weight is 150 lb 6.4 oz (68.221 kg). Her blood pressure is 97/59 and her pulse is 47. Her respiration is 16. . Weight essentially stable.  No significant changes. Impression . . . . . . . The patient is tolerating radiation. Plan . . . . . . . . . . . . Continue treatment as planned.  ________________________________  Sheral Apley. Tammi Klippel, M.D.

## 2014-03-27 ENCOUNTER — Ambulatory Visit
Admission: RE | Admit: 2014-03-27 | Discharge: 2014-03-27 | Disposition: A | Discharge status: Home / Self Care | Payer: Medicare Other | Source: Ambulatory Visit | Attending: Radiation Oncology | Admitting: Radiation Oncology

## 2014-03-28 ENCOUNTER — Ambulatory Visit
Admission: RE | Admit: 2014-03-28 | Discharge: 2014-03-28 | Disposition: A | Discharge status: Home / Self Care | Payer: Medicare Other | Source: Ambulatory Visit | Attending: Radiation Oncology | Admitting: Radiation Oncology

## 2014-03-29 ENCOUNTER — Ambulatory Visit
Admission: RE | Admit: 2014-03-29 | Discharge: 2014-03-29 | Disposition: A | Discharge status: Home / Self Care | Payer: Medicare Other | Source: Ambulatory Visit | Attending: Radiation Oncology | Admitting: Radiation Oncology

## 2014-03-29 ENCOUNTER — Encounter: Payer: Self-pay | Admitting: Radiation Oncology

## 2014-03-29 ENCOUNTER — Other Ambulatory Visit: Payer: Self-pay | Admitting: *Deleted

## 2014-03-29 VITALS — BP 119/65 | HR 61 | Resp 16 | Wt 148.5 lb

## 2014-03-29 DIAGNOSIS — C251 Malignant neoplasm of body of pancreas: Secondary | ICD-10-CM

## 2014-03-29 NOTE — Progress Notes (Signed)
Reports she continues to walk daily for exercise and drink as much fluid as she can tolerate. Ensured her this must be working because her bp pressure has improved. Patient concerned she won't have labs again until 05/07/2014 and questions if this is normal. Also, she resumes chemo this day. Reports last Friday she "hit a brick wall" with fatigue that was unresolved with rest but, since has improved. Denies diarrhea. Denies nausea and vomiting but, reports taking zofran before xeloda. Reports tingling in her feet and itchy right hand. Weight stable. No skin changes noted to abdomen. Continues to use radiaplex as directed.

## 2014-03-29 NOTE — Progress Notes (Signed)
  Radiation Oncology         [phone] ________________________________  Name: Annette Beltran MRN: [id number]  Date: 03/29/2014  DOB: 1946/02/17  Weekly Radiation Therapy Management  Current Dose: 39.6 Gy     Planned Dose:  50.4 Gy  Narrative . . . . . . . . The patient presents for routine under treatment assessment.                                  Reports she continues to walk daily for exercise and drink as much fluid as she can tolerate. Ensured her this must be working because her bp pressure has improved. Patient concerned she won't have labs again until 05/07/2014 and questions if this is normal. Also, she resumes chemo this day. Reports last Friday she "hit a brick wall" with fatigue that was unresolved with rest but, since has improved. Denies diarrhea. Denies nausea and vomiting but, reports taking zofran before xeloda. Reports tingling in her feet and itchy right hand. Weight stable. No skin changes noted to abdomen. Continues to use radiaplex as directed                                 Set-up films were reviewed.                                 The chart was checked. Physical Findings. . .  weight is 148 lb 8 oz (67.359 kg). Her blood pressure is 119/65 and her pulse is 61. Her respiration is 16. . Weight essentially stable.  No significant changes. Impression . . . . . . . The patient is tolerating radiation. Plan . . . . . . . . . . . . Continue treatment as planned.  ________________________________  Sheral Apley. Tammi Klippel, M.D.

## 2014-03-29 NOTE — Telephone Encounter (Signed)
Left VM for patient to check her supply and let nurse know how many days supply she will need to complete therapy. Last RT is 04/06/14. Also sent her My Chart message.

## 2014-03-29 NOTE — Telephone Encounter (Addendum)
Patient reports she has more than enough Xeloda to complete therapy. Does not require a refill. Asking what to do with leftover pills. Instructed her to bring them to office for proper disposal since they are biohazardous. Notified [facility] that refill not needed.

## 2014-03-30 ENCOUNTER — Ambulatory Visit
Admission: RE | Admit: 2014-03-30 | Discharge: 2014-03-30 | Disposition: A | Discharge status: Home / Self Care | Payer: Medicare Other | Source: Ambulatory Visit | Attending: Radiation Oncology | Admitting: Radiation Oncology

## 2014-04-02 ENCOUNTER — Ambulatory Visit
Admission: RE | Admit: 2014-04-02 | Discharge: 2014-04-02 | Disposition: A | Discharge status: Home / Self Care | Payer: Medicare Other | Source: Ambulatory Visit | Attending: Radiation Oncology | Admitting: Radiation Oncology

## 2014-04-03 ENCOUNTER — Ambulatory Visit
Admission: RE | Admit: 2014-04-03 | Discharge: 2014-04-03 | Disposition: A | Discharge status: Home / Self Care | Payer: Medicare Other | Source: Ambulatory Visit | Attending: Radiation Oncology | Admitting: Radiation Oncology

## 2014-04-04 ENCOUNTER — Encounter: Payer: Self-pay | Admitting: Radiation Oncology

## 2014-04-04 ENCOUNTER — Ambulatory Visit
Admission: RE | Admit: 2014-04-04 | Discharge: 2014-04-04 | Disposition: A | Discharge status: Home / Self Care | Payer: Medicare Other | Source: Ambulatory Visit | Attending: Radiation Oncology | Admitting: Radiation Oncology

## 2014-04-04 VITALS — BP 117/59 | HR 63 | Resp 16 | Wt 151.3 lb

## 2014-04-04 DIAGNOSIS — C251 Malignant neoplasm of body of pancreas: Secondary | ICD-10-CM

## 2014-04-04 MED ORDER — LORAZEPAM 0.5 MG PO TABS: 0.5000 mg | ORAL_TABLET | Freq: Four times a day (QID) | ORAL | Status: DC | PRN

## 2014-04-04 MED ORDER — RADIAPLEXRX EX GEL
Freq: Once | CUTANEOUS | Status: AC
Start: 2014-04-04 — End: 2014-04-04
  Administered 2014-04-04: 12:00:00 via TOPICAL

## 2014-04-04 NOTE — Progress Notes (Signed)
Patient completes treatment on Friday. Provided patient with one month follow up appointment card. Also, provided patient with Radiaplex. Instructed her to continues radiaplex bid for two week s/p completion of radiation treatments. Patient verbalized understanding.

## 2014-04-04 NOTE — Progress Notes (Signed)
  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 . . . . . . . The patient presents for routine under treatment assessment.                                 Patient completes treatment on Friday. Provided patient with one month follow up appointment card. Also, provided patient with Radiaplex. Instructed her to continues radiaplex bid for two week s/p completion of radiation treatments. Patient verbalized understanding                                Set-up films were reviewed.                                 The chart was checked. Physical Findings. . .  weight is 151 lb 4.8 oz (68.629 kg). Her blood pressure is 117/59 and her pulse is 63. Her respiration is 16 and oxygen saturation is 99%. . Weight essentially stable.  No significant changes. Impression . . . . . . . The patient is tolerating radiation. Plan . . . . . . . . . . . . Continue treatment as planned.  ________________________________  Sheral Apley. Tammi Klippel, M.D.

## 2014-04-04 NOTE — Progress Notes (Signed)
Requesting refill of her ativan prescription. She takes this medication before her xeloda each day instead of compazine and zofran. Reports fatigue that require a mid afternoon nap and her to go to bed earlier. Denies nausea or vomiting. Denies diarrhea. Reports constipation yesterday that resolved during the middle of the night with two bowel movements.

## 2014-04-05 ENCOUNTER — Ambulatory Visit
Admission: RE | Admit: 2014-04-05 | Discharge: 2014-04-05 | Disposition: A | Discharge status: Home / Self Care | Payer: Medicare Other | Source: Ambulatory Visit | Attending: Radiation Oncology | Admitting: Radiation Oncology

## 2014-04-06 ENCOUNTER — Ambulatory Visit
Admission: RE | Admit: 2014-04-06 | Discharge: 2014-04-06 | Disposition: A | Discharge status: Home / Self Care | Payer: Medicare Other | Source: Ambulatory Visit | Attending: Radiation Oncology | Admitting: Radiation Oncology

## 2014-04-06 ENCOUNTER — Encounter: Payer: Self-pay | Admitting: Radiation Oncology

## 2014-04-06 NOTE — Progress Notes (Signed)
  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  The pancreatic surgical bed and regional lymph node stations were initially treated to 45 Gy in 25 fractions of 1.8 Gy 2.  The pancreatic surgical bed was boosted 50.4 Gy with 3 additional fractions of 1.8 Gy  Beams/energy:    1.  The pancreatic surgical bed and regional lymph node stations were initially treated using a 3D conformal 4-field technique with 15 MV X-rays and cone-beam CT guidance. 2.  The pancreatic surgical bed was boosted using a reduced 3D conformal 4-field technique with 15 MV X-rays and cone-beam CT guidance.  Narrative: The patient tolerated radiation treatment relatively well.   She continued to walk daily for exercise and drink as much fluid as she could tolerate. She felt like she "hit a brick wall" with fatigue that was unresolved with rest but during the penultimate week of radiation but, since has improved. Denied diarrhea. Denied nausea and vomiting but, reported taking zofran before xeloda. Reported tingling in her feet and itchy right hand. Weight stable. No skin changes noted to abdomen. Continued to use radiaplex as directed  Plan: The patient has completed radiation treatment. The patient will return to radiation oncology clinic for routine followup in one month. I advised them to call or return sooner if they have any questions or concerns related to their recovery or treatment. ________________________________  Sheral Apley. Tammi Klippel, M.D.

## 2014-04-10 ENCOUNTER — Telehealth: Payer: Self-pay | Admitting: Radiation Oncology

## 2014-04-10 NOTE — Telephone Encounter (Signed)
Patient returned this writer's call. She verbalizes that she feels much better today. She is taking compazine around the clock as directed. She explained she managed to drink broth and eat a popsicle last night.  Also, she reports she has the energy to get a few things done today. Encouraged her to pace herself and call with future needs. She verbalized understanding.

## 2014-04-10 NOTE — Telephone Encounter (Signed)
Phoned this morning to assess patient status. No answer. Left message requesting return call.

## 2014-04-10 NOTE — Telephone Encounter (Signed)
04/09/2014 at 1128 Received call from patient this morning. Anxiety heard in her voice. She expressed that Saturday after a long walk with her husband she felt very dizzy. Then, Sunday she continued to feel dizzy, felt like she had been hit by a [employer] truck, and nauseated. In addition, she has noted her hair is thinning. She reports she feels like she is freezing but, isn't running a fever. She reports her bp is 84/68 and heart rate 63. In addition, she reports her blood sugar when she originally got up was 68 but, after eating a small portion rose to 128. Informed Dr. Tammi Klippel of these findings. Phoned patient back at 1431. Per Dr. Johny Shears order encouraged the patient to rest and hydrate. Also, instructed patient if these symptoms persisted to present to the ED for evaluation. Also, explained her hair is thinning as a result of the xeloda. Patient reports she took her last Xeloda on Friday. Patient verbalized understanding of all reviewed. Will continue to follow up with this patient.

## 2014-04-12 ENCOUNTER — Telehealth: Payer: Self-pay | Admitting: *Deleted

## 2014-04-12 ENCOUNTER — Ambulatory Visit (INDEPENDENT_AMBULATORY_CARE_PROVIDER_SITE_OTHER): Payer: Medicare Other | Admitting: Internal Medicine

## 2014-04-12 ENCOUNTER — Encounter: Payer: Self-pay | Admitting: Internal Medicine

## 2014-04-12 VITALS — BP 104/68 | HR 76 | Temp 98.7°F | Wt 151.0 lb

## 2014-04-12 DIAGNOSIS — J069 Acute upper respiratory infection, unspecified: Secondary | ICD-10-CM

## 2014-04-12 MED ORDER — AZITHROMYCIN 250 MG PO TABS: ORAL_TABLET | ORAL | Status: DC

## 2014-04-12 NOTE — Telephone Encounter (Signed)
Returned call patient asking about possible sinus infection, redirected this to her primary MD, we treated her pancreas recently and this does not pertain to radiation, she said she would call them again 12:46 PM  12:46 PM

## 2014-04-16 ENCOUNTER — Encounter: Payer: Self-pay | Admitting: Internal Medicine

## 2014-04-16 ENCOUNTER — Telehealth: Payer: Self-pay | Admitting: Internal Medicine

## 2014-04-16 ENCOUNTER — Telehealth: Payer: Self-pay | Admitting: Radiation Oncology

## 2014-04-16 NOTE — Telephone Encounter (Signed)
Patient states she has no fever or chills. Is not coughing, just very congested in head and sinuses. A little dizzy this am. Took some Aleve and Claritin. Ok per Dr. Renold Genta for some Dayquil to use prn.

## 2014-04-16 NOTE — Progress Notes (Signed)
   Subjective:    Patient ID: Annette Beltran, female    DOB: 1946/09/21, 68 y.o.   MRN: [id number]  HPI Patient has come down with acute respiratory infection. After her surgery, she received IV Gemzar and has completed radiation treatment in early June. She has had low-grade fever. Has had head and ear congestion. No shortness of breath.    Review of Systems     Objective:   Physical Exam TMs are full but not red. Pharynx is clear. Rapid strep screen negative. Neck is supple without adenopathy. Chest clear. Skin is warm and dry.       Assessment & Plan:  Acute URI-suspect viral etiology  Plan: Zithromax Z-Pak take 2 tablets day one followed by 1 tablet days 2 through 5.  Addendum: 04/16/2014: Patient called back saying she was still very congested in her head. Not coughing. Recommend decongestant and continue with Zithromax.

## 2014-04-16 NOTE — Patient Instructions (Signed)
And Z-Pak as directed.

## 2014-04-16 NOTE — Progress Notes (Signed)
  Radiation Oncology         [phone] ________________________________  Name: Annette Beltran MRN: [id number]  Date: 03/29/2014  DOB: Jun 06, 1946  VIRTUAL SIMULATION NOTE  NARRATIVE:  The patient underwent simulation today for ongoing radiation therapy.  The existing CT study set was employed for the purpose of virtual treatment planning.  The target and avoidance structures were reviewed and in some cases modified.  Treatment planning then occurred.  The radiation boost prescription was entered and confirmed.  A total of 4 complex treatment devices were fabricated in the form of multi-leaf collimators to shape radiation around the targets while maximally excluding nearby normal structures. I have requested : 3D Simulation  I have requested a DVH of the following structures: kidneys, spinal cord, bowel, liver and target.   PLAN:  This modified radiation beam arrangement is intended to continue the current radiation dose to an additional 5.4 Gy in 3 fractions for a total cumulative dose of 50.4 Gy.  ------------------------------------------------  Sheral Apley. Tammi Klippel, M.D.

## 2014-04-16 NOTE — Progress Notes (Signed)
  Radiation Oncology         [phone] ________________________________  Name: Annette Beltran MRN: [id number]  Date: 04/04/2014  DOB: 08/02/1946  Simulation Verification Note  Status: outpatient  NARRATIVE: The patient was brought to the treatment unit and placed in the planned treatment position. The clinical setup was verified. Then port films were obtained and uploaded to the radiation oncology medical record software.  The treatment beams were carefully compared against the planned radiation fields. The position location and shape of the radiation fields was reviewed. They targeted volume of tissue appears to be appropriately covered by the radiation beams. Organs at risk appear to be excluded as planned.  Based on my personal review, I approved the simulation verification. The patient's treatment will proceed as planned.  ------------------------------------------------  Sheral Apley Tammi Klippel, M.D.

## 2014-04-16 NOTE — Telephone Encounter (Signed)
Please call Ariele and get more info. Is she running fever or just coughing? And and May need CXR.

## 2014-04-16 NOTE — Telephone Encounter (Signed)
Phoned to assess patient status. She reports she isn't feeling any better. She reports dizziness, headache, fluid in ears and believes she still has a sinus infection. She explains she completed her 5 day z pak this morning. She reports nausea is less. Reports fatigue continues. Encouraged her to contact her PCP reference continued sinus infection. Offered appointment for Thursday but, explained sinus infection isn't related to radiation. Patient plans to contact her PCP. She understands to call with future needs. Confirmed one month follow up on 05/17/2014. She expressed appreciation for the call.

## 2014-04-25 ENCOUNTER — Other Ambulatory Visit: Payer: Self-pay | Admitting: Physician Assistant

## 2014-05-07 ENCOUNTER — Other Ambulatory Visit (HOSPITAL_BASED_OUTPATIENT_CLINIC_OR_DEPARTMENT_OTHER): Payer: Medicare Other

## 2014-05-07 ENCOUNTER — Ambulatory Visit (HOSPITAL_BASED_OUTPATIENT_CLINIC_OR_DEPARTMENT_OTHER): Payer: Medicare Other | Admitting: Oncology

## 2014-05-07 ENCOUNTER — Ambulatory Visit (HOSPITAL_BASED_OUTPATIENT_CLINIC_OR_DEPARTMENT_OTHER): Payer: Medicare Other

## 2014-05-07 VITALS — BP 142/85 | HR 88 | Temp 98.4°F | Resp 20 | Ht 64.0 in | Wt 151.7 lb

## 2014-05-07 DIAGNOSIS — C50911 Malignant neoplasm of unspecified site of right female breast: Secondary | ICD-10-CM

## 2014-05-07 DIAGNOSIS — I700 Atherosclerosis of aorta: Secondary | ICD-10-CM

## 2014-05-07 DIAGNOSIS — J90 Pleural effusion, not elsewhere classified: Secondary | ICD-10-CM

## 2014-05-07 DIAGNOSIS — Z853 Personal history of malignant neoplasm of breast: Secondary | ICD-10-CM

## 2014-05-07 DIAGNOSIS — C252 Malignant neoplasm of tail of pancreas: Secondary | ICD-10-CM

## 2014-05-07 DIAGNOSIS — C251 Malignant neoplasm of body of pancreas: Secondary | ICD-10-CM

## 2014-05-07 DIAGNOSIS — D7389 Other diseases of spleen: Secondary | ICD-10-CM

## 2014-05-07 DIAGNOSIS — Z5111 Encounter for antineoplastic chemotherapy: Secondary | ICD-10-CM

## 2014-05-07 LAB — CBC WITH DIFFERENTIAL/PLATELET
BASO%: 1.1 % (ref 0.0–2.0)
Basophils Absolute: 0.1 10*3/uL (ref 0.0–0.1)
EOS ABS: 0.9 10*3/uL — AB (ref 0.0–0.5)
EOS%: 13.5 % — ABNORMAL HIGH (ref 0.0–7.0)
HCT: 40.7 % (ref 34.8–46.6)
HGB: 14.1 g/dL (ref 11.6–15.9)
LYMPH%: 9.6 % — ABNORMAL LOW (ref 14.0–49.7)
MCH: 35.1 pg — ABNORMAL HIGH (ref 25.1–34.0)
MCHC: 34.7 g/dL (ref 31.5–36.0)
MCV: 101.2 fL — ABNORMAL HIGH (ref 79.5–101.0)
MONO#: 0.8 10*3/uL (ref 0.1–0.9)
MONO%: 12 % (ref 0.0–14.0)
NEUT%: 63.8 % (ref 38.4–76.8)
NEUTROS ABS: 4.3 10*3/uL (ref 1.5–6.5)
Platelets: 347 10*3/uL (ref 145–400)
RBC: 4.02 10*6/uL (ref 3.70–5.45)
RDW: 17.3 % — AB (ref 11.2–14.5)
WBC: 6.8 10*3/uL (ref 3.9–10.3)
lymph#: 0.6 10*3/uL — ABNORMAL LOW (ref 0.9–3.3)

## 2014-05-07 LAB — COMPREHENSIVE METABOLIC PANEL (CC13)
ALT: 14 U/L (ref 0–55)
AST: 24 U/L (ref 5–34)
Albumin: 3.6 g/dL (ref 3.5–5.0)
Alkaline Phosphatase: 75 U/L (ref 40–150)
Anion Gap: 8 mEq/L (ref 3–11)
BILIRUBIN TOTAL: 0.5 mg/dL (ref 0.20–1.20)
BUN: 14.3 mg/dL (ref 7.0–26.0)
CHLORIDE: 107 meq/L (ref 98–109)
CO2: 26 mEq/L (ref 22–29)
Calcium: 9.4 mg/dL (ref 8.4–10.4)
Creatinine: 0.7 mg/dL (ref 0.6–1.1)
Glucose: 131 mg/dl (ref 70–140)
Potassium: 4.2 mEq/L (ref 3.5–5.1)
Sodium: 141 mEq/L (ref 136–145)
TOTAL PROTEIN: 7.3 g/dL (ref 6.4–8.3)

## 2014-05-07 LAB — CANCER ANTIGEN 19-9: CA 19 9: 30.5 U/mL (ref <35.0)

## 2014-05-07 MED ORDER — SODIUM CHLORIDE 0.9 % IJ SOLN
10.0000 mL | INTRAMUSCULAR | Status: DC | PRN
  Administered 2014-05-07: 10 mL
  Filled 2014-05-07: qty 10

## 2014-05-07 MED ORDER — SODIUM CHLORIDE 0.9 % IV SOLN
1000.0000 mg/m2 | Freq: Once | INTRAVENOUS | Status: AC
  Administered 2014-05-07: 1748 mg via INTRAVENOUS
  Filled 2014-05-07: qty 45.97

## 2014-05-07 MED ORDER — PROCHLORPERAZINE MALEATE 10 MG PO TABS
10.0000 mg | ORAL_TABLET | Freq: Once | ORAL | Status: AC
  Administered 2014-05-07: 10 mg via ORAL

## 2014-05-07 MED ORDER — SODIUM CHLORIDE 0.9 % IV SOLN
Freq: Once | INTRAVENOUS | Status: AC
  Administered 2014-05-07: 09:00:00 via INTRAVENOUS

## 2014-05-07 MED ORDER — HEPARIN SOD (PORK) LOCK FLUSH 100 UNIT/ML IV SOLN
500.0000 [IU] | Freq: Once | INTRAVENOUS | Status: AC | PRN
  Administered 2014-05-07: 500 [IU]
  Filled 2014-05-07: qty 5

## 2014-05-07 MED ORDER — PROCHLORPERAZINE MALEATE 10 MG PO TABS
ORAL_TABLET | ORAL | Status: AC
  Filled 2014-05-07: qty 1

## 2014-05-07 NOTE — Patient Instructions (Signed)
[facility] Discharge Instructions for Patients Receiving Chemotherapy  Today you received the following chemotherapy agents gemzar To help prevent nausea and vomiting after your treatment, we encourage you to take your nausea medication as prescribed   If you develop nausea and vomiting that is not controlled by your nausea medication, call the clinic.   BELOW ARE SYMPTOMS THAT SHOULD BE REPORTED IMMEDIATELY:  *FEVER GREATER THAN 100.5 F  *CHILLS WITH OR WITHOUT FEVER  NAUSEA AND VOMITING THAT IS NOT CONTROLLED WITH YOUR NAUSEA MEDICATION  *UNUSUAL SHORTNESS OF BREATH  *UNUSUAL BRUISING OR BLEEDING  TENDERNESS IN MOUTH AND THROAT WITH OR WITHOUT PRESENCE OF ULCERS  *URINARY PROBLEMS  *BOWEL PROBLEMS  UNUSUAL RASH Items with * indicate a potential emergency and should be followed up as soon as possible.  Feel free to call the clinic you have any questions or concerns. The clinic phone number is [phone].    

## 2014-05-07 NOTE — Progress Notes (Signed)
Annette Beltran  [phone] [phone]     ID: [name] OB: 1946-06-13  MR#: [id number]  JYN#:[id number]  PCP: [name], MD GYN:   SU: [name] OTHER MD: Delfin Edis, [name]  CHIEF COMPLAINT: Early stage pancreatic cancer resected with positive margins  CURRENT TREATMENT: Adjuvant gemcitabine   PANCREATIC CANCER HISTORY: I formerly followed Annette Beltran for a stage I breast cancer, which was treated with surgery, radiation, and anti-estrogens. She was released from followup here in 2007. In addition, in 2012 she had abdominal pain leading to hospitalization. 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 The patient also has a history of collagenous colitis, which of course carries its own set of symptoms, making identification of her new attic problem more difficult.  [name] tells me she started "feeling bad" in September of 2014. There was abdominal discomfort localizing to the left upper o'clock on, worse with inspiration. CT angiography 08/21/2013 showed no clot and no significant abnormalities. Plain rib and left shoulder views were negative. CT abdomen and pelvis with contrast 08/22/2013 showed slight prominence of the pancreatic duct in the distal body and tail with a new small structure emanating from the tail of the pancreas consistent with a pseudocyst. Exudate near the tail of the pancreas suggested pancreatitis. MRI of the abdomen 09/03/2013 confirmed several ill-defined fluid collections between the gastric fundus and the splenic hilum surrounding the pancreatic tail. There was no evidence of a pancreatic mass. The pancreatic tail did enhanced following contrast. The splenic vein appeared chronically thrombosed with a small amount of  nonocclusive thrombus extending into the main and left portal veins.  On 09/26/2013 amylase was 237 and lipase 513.0, consistent with acute pancreatitis. These numbers subsequently dropped in on 11/28/2013 amylase was normal at 47 and lipase was mildly elevated at 110  With continuing left-sided abdominal discomfort, abdominal ultrasound 11/13/2013 showed multiple gallstones with no evidence of cholecystitis. Evaluation of the pancreas showed a hypoechoic lobulated focus measuring up to 7.4 cm felt to be nonspecific. Repeat abdominal CT 11/14/2013 showed diffuse pancreatic atrophy without evidence of a mass, again consistent with chronic pancreatitis. There was mild soft tissue stranding suspicious for mild superimposed acute pancreatitis. A new rim-enhancing fluid collection was noted in the gastrosplenic ligament measuring 4.5 cm, consistent with a pseudocyst. Finally on 11/23/2013, endoscopic ultrasonography under Oretha Caprice showed a subtle 2.5 cm soft tissue mass in the mid-pancreas, abutting the splenic vein. This was biopsied x2, with cytology (NZB [id number]) showing adenocarcinoma. A peripancreatic fluid collection measuring over 5 cm was aspirated yielding 25 cc of chocolate colored fluid. Two or three scattered round hypoechoic lymph nodes were noted near the pancreatic tail but were not sampled.  In addition, on 11/28/2013 a chest x-ray obtained to evaluate upper respiratory symptoms showed a small to moderate left effusion. Chest CT scan 11/29/2013 confirmed a moderate left-sided pleural effusion with no suspicious appearing pulmonary nodules or masses associated with it. The complex cystic lesion associated with the tail of the pancreas was again noted, measuring 5.5 cm despite the recent aspiration procedure.  The patient's subsequent history is as detailed below.  INTERVAL HISTORY: Annette Beltran returns today with her husband Annette Beltran for followup of her pancreatic cancer. Since her last visit here she  completed her radiation treatments, given with gemcitabine sensitization.  Perhaps more importantly, her husband Annette Beltran has been diagnosed with a spindle cell tumor which was resected recently by Dr. Excell Seltzer. He tells me he would like me to be his medical oncologist. He has also requested Dr. Tammi Klippel in radiation.  REVIEW OF SYSTEMS: [name] tells me the last 3 radiation treatments "knock her down" and she found it hard to get out of bed for a few days. She also felt dizzy. Dr. Renold Genta, her primary care physician, treated her for sinus and that problem has cleared. [name] tolerated the capecitabine with some swelling but no significant peeling or erythema of her toes. There were no hand symptoms, no diarrhea, and only minimal nausea problems. She still has a little bit of taste dysfunction, but her appetite is "too good", she has normal bowel movements, has not lost weight, and is in a regular walking program. A detailed review of systems today was otherwise noncontributory  PAST MEDICAL HISTORY: Past Medical History  Diagnosis Date  . Macular degeneration   . Fluid retention   . Colitis, collagenous   . Vitamin D deficiency   . Osteopenia   . Celiac artery stenosis   . Splenic infarction   . Arthritis   . History of blood clots   . Anxiety   . Shingles   . Pancreatitis   . Osteoporosis   . Clotting disorder   . Breast cancer     rt lumpectomy  . Arrhythmia     "skips a beat"  [place]  heart  . GERD (gastroesophageal reflux disease)     PAST SURGICAL HISTORY: Past Surgical History  Procedure Laterality Date  . Foot surgery  2003    x 3, 2 on right, 1 on left  . Breast lumpectomy Right     radiation for 6 weeks  . Meniscus repair Right   . Eus N/A 11/23/2013    Procedure: UPPER ENDOSCOPIC ULTRASOUND (EUS) LINEAR;  Surgeon: Milus Banister, MD;  Location: [facility];  Service: Endoscopy;  Laterality: N/A;  . Tonsillectomy    . Laparoscopy N/A 12/20/2013    Procedure: LAPAROSCOPY  DIAGNOSTIC ;  Surgeon: [name], MD;  Location: [facility];  Service: General;  Laterality: N/A;  . Cholecystectomy N/A 12/20/2013    Procedure: LAPAROSCOPIC CHOLECYSTECTOMY;  Surgeon: [name], MD;  Location: [facility];  Service: General;  Laterality: N/A;  . Splenectomy, total N/A 12/20/2013    Procedure: SPLENECTOMY;  Surgeon: [name], MD;  Location: [facility];  Service: General;  Laterality: N/A;  . Partial gastrectomy N/A 12/20/2013    Procedure: PARTIAL GASTRECTOMY;  Surgeon: [name], MD;  Location: [facility];  Service: General;  Laterality: N/A;  . Portacath placement N/A 01/15/2014    Procedure: INSERTION PORT-A-CATH;  Surgeon: [name], MD;  Location: [facility];  Service: General;  Laterality: N/A;    FAMILY HISTORY Family History  Problem Relation Age of Onset  . Colon cancer Neg Hx   . Aneurysm Mother   . Stroke Mother   . Prostate cancer Father 93  . Skin cancer Brother 59    treated with interferon for 1 year  . Heart failure Paternal Aunt   . Breast cancer Paternal Aunt     dx over 48s  . Pancreatic cancer Maternal Grandmother     dx in her 71s  . Breast cancer Maternal Aunt     dx over 76  . Rheum arthritis Brother   . Breast cancer Paternal Aunt     dx over  75s  . Leukemia Paternal Aunt    the patient's father had a diagnosis of prostate cancer metastatic to the liver or a died at age 102. The patient's mother died secondary to carotid third brain aneurysm at the age of 23. The patient had 2 brothers, no sisters. One brother has a history of melanoma. The patient's mother is mother was diagnosed with pancreatic cancer in her 49s. There is a history of breast cancer and second degree relatives, none before the age of 55  GYNECOLOGIC HISTORY:  Menarche age 26, first live birth age 89, the patient is [employer] P2.. Stopped in her early 15s. 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 Her husband of 26 years, Annette Beltran, retired from [employer] and currently works part-time as a Cabin crew with [employer]. Daughter Marita [place] teaches first grade. Daughter Leafy Ro is a homemaker. The patient has 4 grandchildren Annette Beltran has an additional 5 grandchildren of his own). The patient attends a [employer]    ADVANCED DIRECTIVES: In place   HEALTH MAINTENANCE: History  Substance Use Topics  . Smoking status: Former Smoker -- 0.50 packs/day for 28 years    Types: Cigarettes    Quit date: 11/02/2000  . Smokeless tobacco: Never Used  . Alcohol Use: Yes     Comment: occ wine      Colonoscopy:  PAP:  Bone density:  Lipid panel:  Mammography:  Allergies  Allergen Reactions  . Codeine Nausea And Vomiting  . Lactose Intolerance (Gi)   . Tequin     Severe stomach pain  . Penicillins Nausea Only and Rash    Current Outpatient Prescriptions  Medication Sig Dispense Refill  . ALPRAZolam (XANAX) 0.5 MG tablet Take 1 tablet (0.5 mg total) by mouth 2 (two) times daily as needed for anxiety.  60 tablet  5  . Ascorbic Acid (VITAMIN C PO) Take 1 tablet by mouth 2 (two) times daily.      Marland Kitchen aspirin 81 MG tablet Take 81 mg by mouth daily.      Marland Kitchen azithromycin (ZITHROMAX Z-PAK) 250 MG tablet Take 2 tablets (500 mg) on  Day 1,  followed by 1 tablet (250 mg) once daily on Days 2 through 5.  6 each  0  . b complex vitamins tablet Take 1 tablet by mouth daily.      . capecitabine (XELODA) 500 MG tablet Take two tablets twice daily on radiation days  120 tablet  0  . diphenhydrAMINE (BENADRYL) 25 MG tablet Take 25 mg by mouth at bedtime.       Marland Kitchen esomeprazole (NEXIUM) 20 MG capsule Take 20 mg by mouth daily at 12 noon.      . feeding supplement, RESOURCE BREEZE, (RESOURCE BREEZE) LIQD Take 1 Container by mouth 2 (two) times daily between meals.  30 Container  3  . furosemide (LASIX) 20 MG tablet Take 1 tablet (20 mg total) by mouth daily. Prn swelling  15  tablet  5  . lidocaine-prilocaine (EMLA) cream Apply 1-2 hours to port a cath before procedure.  30 g  6  . lipase/protease/amylase (CREON-12/PANCREASE) 12000 UNITS CPEP capsule Take 2 capsules by mouth 3 (three) times daily with meals.  270 capsule  3  . loratadine (CLARITIN) 10 MG tablet Take 10 mg by mouth daily.      Marland Kitchen LORazepam (ATIVAN) 0.5 MG  tablet Take 1 tablet (0.5 mg total) by mouth every 6 (six) hours as needed (Nausea or vomiting).  30 tablet  0  . naproxen sodium (ANAPROX) 220 MG tablet Take 220 mg by mouth 2 (two) times daily as needed (pain).       . ondansetron (ZOFRAN) 8 MG tablet Take 1 tablet (8 mg total) by mouth 2 (two) times daily as needed (Nausea or vomiting).  30 tablet  1  . PARoxetine (PAXIL) 10 MG tablet Take 1 tablet (10 mg total) by mouth daily.  30 tablet  5  . prochlorperazine (COMPAZINE) 10 MG tablet Take 1 tablet (10 mg total) by mouth every 6 (six) hours as needed (Nausea or vomiting).  30 tablet  1  . valACYclovir (VALTREX) 1000 MG tablet Take 1 tablet (1,000 mg total) by mouth 2 (two) times daily.  90 tablet  1   No current facility-administered medications for this visit.    OBJECTIVE: Middle-aged white woman in no acute distress Filed Vitals:   05/07/14 0824  BP: 142/85  Pulse: 88  Temp: 98.4 F (36.9 C)  Resp: 20     Body mass index is 26.03 kg/(m^2).    ECOG FS:0 - Asymptomatic  Ocular: Sclerae unicteric, pupils round and equal Ear-nose-throat: Oropharynx clear; slightly dry Lymphatic: No cervical or supraclavicular adenopathy Lungs no rales or rhonchi Heart regular rate and rhythm Abd  soft, nontender to palpation in all quadrants, positive bowel sounds, no masses palpated, wound is nicely healed MSK no focal spinal tenderness, no swelling or erythema or acral skin changes Neuro: non-focal, well-oriented, positive affect Breasts: Deferred  LAB RESULTS:  CMP     Component Value Date/Time   NA 137 03/15/2014 1459   NA 143 12/26/2013 0620    K 3.9 03/15/2014 1459   K 3.7 12/26/2013 0620   CL 101 12/26/2013 0620   CO2 22 03/15/2014 1459   CO2 32 12/26/2013 0620   GLUCOSE 201* 03/15/2014 1459   GLUCOSE 92 12/26/2013 0620   BUN 14.3 03/15/2014 1459   BUN 9 12/26/2013 0620   CREATININE 0.7 03/15/2014 1459   CREATININE 0.57 12/26/2013 0620   CREATININE 0.62 11/28/2013 1635   CALCIUM 9.3 03/15/2014 1459   CALCIUM 8.7 12/26/2013 0620   PROT 6.8 03/15/2014 1459   PROT 5.0* 12/21/2013 0450   ALBUMIN 3.5 03/15/2014 1459   ALBUMIN 2.3* 12/21/2013 0450   AST 16 03/15/2014 1459   AST 42* 12/21/2013 0450   ALT 14 03/15/2014 1459   ALT 25 12/21/2013 0450   ALKPHOS 69 03/15/2014 1459   ALKPHOS 46 12/21/2013 0450   BILITOT 0.36 03/15/2014 1459   BILITOT 0.7 12/21/2013 0450   GFRNONAA >90 12/26/2013 0620   GFRAA >90 12/26/2013 0620   Results for GIANNI, FUCHS (MRN [id number]) as of 03/17/2014 09:43  Ref. Range 11/29/2013 15:33 01/08/2014 16:13 02/01/2014 08:21 02/22/2014 13:48 03/15/2014 14:59  CA 19-9 Latest Range: <35.0 U/mL 47.0 (H) 17.9 25.1 23.8 26.8   I No results found for this basename: SPEP,  UPEP,   kappa and lambda light chains    Lab Results  Component Value Date   WBC 6.8 05/07/2014   NEUTROABS 4.3 05/07/2014   HGB 14.1 05/07/2014   HCT 40.7 05/07/2014   MCV 101.2* 05/07/2014   PLT 347 05/07/2014      Chemistry      Component Value Date/Time   NA 137 03/15/2014 1459   NA 143 12/26/2013 0620   K 3.9 03/15/2014 1459  K 3.7 12/26/2013 0620   CL 101 12/26/2013 0620   CO2 22 03/15/2014 1459   CO2 32 12/26/2013 0620   BUN 14.3 03/15/2014 1459   BUN 9 12/26/2013 0620   CREATININE 0.7 03/15/2014 1459   CREATININE 0.57 12/26/2013 0620   CREATININE 0.62 11/28/2013 1635      Component Value Date/Time   CALCIUM 9.3 03/15/2014 1459   CALCIUM 8.7 12/26/2013 0620   ALKPHOS 69 03/15/2014 1459   ALKPHOS 46 12/21/2013 0450   AST 16 03/15/2014 1459   AST 42* 12/21/2013 0450   ALT 14 03/15/2014 1459   ALT 25 12/21/2013 0450   BILITOT 0.36 03/15/2014 1459   BILITOT 0.7  12/21/2013 0450       Lab Results  Component Value Date   LABCA2 8 06/08/2011    No components found with this basename: [id number]    No results found for this basename: INR,  in the last 168 hours  Urinalysis    Component Value Date/Time   COLORURINE YELLOW 06/07/2011 1942   APPEARANCEUR CLEAR 06/07/2011 1942   LABSPEC 1.010 02/15/2014 1109   LABSPEC 1.009 06/07/2011 1942   PHURINE 6.0 06/07/2011 1942   GLUCOSEU Negative 02/15/2014 [place] 06/07/2011 1942   HGBUR NEGATIVE 06/07/2011 1942   BILIRUBINUR neg 12/25/2011 [place] 06/07/2011 1942   KETONESUR NEGATIVE 06/07/2011 1942   PROTEINUR neg 12/25/2011 1216   PROTEINUR NEGATIVE 06/07/2011 1942   UROBILINOGEN 0.2 02/15/2014 1109   UROBILINOGEN negative 12/25/2011 1216   UROBILINOGEN 0.2 06/07/2011 1942   NITRITE neg 12/25/2011 1216   NITRITE NEGATIVE 06/07/2011 1942   LEUKOCYTESUR Negative 12/25/2011 1216    STUDIES: No results found.  ASSESSMENT: 68 y.o. 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 noted on abdominal MRI November 2014  PANCREATIC CANCER: (3) status post endoscopic ultrasonography of the pancreas 11/23/2013 with cytology positive for adenocarcinoma, clinical stage T2 NX MX adenocarcinoma associated with a 5.5 cm pseudocyst (which was aspirated); baseline CA 19-9 was 47.0  (4)  left pleural effusion status post thoracentesis 12/01/2013, cytology negative  (5) s/p laparoscopic cholecystectomy, open extended distal pancreatectomy, splenectomy, partial gastrectomy x2 12/20/2013 for a pT1 pN0, stage IA invasive ductal adenocarcinoma, grade 2, with positive resection margins  (6) adjuvant gemcitabine started 02/01/2014-- received 3 weekly doses before radiation, will reeive 9 additional doses starting 3-5 weeks after completion of radiation  (7) adjuvant radiation started 02/27/2014, with radiosensitization (capecitabine 1000 mg po BID on radiation days); completed 04/06/2014  (8) adjuvant gemcitabine weekly resumed 05/07/2014, with 9 additional doses planned  PLAN: Diannie has recovered well from her radiation and capecitabine and is now ready to start her adjuvant gemcitabine. The plan is for 9 weekly treatments, which we will try to do consecutively.  The developments with Annette Beltran are troubling. I have not reviewed his pathology, but he would like me to be his  medical oncologist. I have explained that I do not do sarcoma regularly and that when I do see a sarcoma patient I obtain a second opinion usually S2. I will discuss all this with his surgeon Dr. Excell Seltzer this week and likely see. Annette Beltran sometime in the next 2-3 weeks after we get a PET scan. Around that time also we should arrange for second opinion as discussed.  Tyyne will see me again next week. She has a good understanding of the overall plan. She agrees with it. She knows the goal of treatment is cure. She will call with any problems that may develop before her next visit here.  Chauncey Cruel, MD   05/07/2014 8:44 AM

## 2014-05-11 ENCOUNTER — Telehealth: Payer: Self-pay | Admitting: Radiation Oncology

## 2014-05-11 NOTE — Telephone Encounter (Signed)
Patient phoned requesting to move one month follow up appointment to August 3 after her husband's consult that day at 3 pm. Cancelled 7/16 appt. Moved her follow up with Tammi Klippel to August 3 at 4 pm. Patient without complaints or pain. Understands to call with needs. Expresses appreciation because this change will allow her and her [place].

## 2014-05-14 ENCOUNTER — Other Ambulatory Visit (HOSPITAL_BASED_OUTPATIENT_CLINIC_OR_DEPARTMENT_OTHER): Payer: Medicare Other

## 2014-05-14 ENCOUNTER — Ambulatory Visit (HOSPITAL_BASED_OUTPATIENT_CLINIC_OR_DEPARTMENT_OTHER): Payer: Medicare Other

## 2014-05-14 ENCOUNTER — Ambulatory Visit (HOSPITAL_BASED_OUTPATIENT_CLINIC_OR_DEPARTMENT_OTHER): Payer: Medicare Other | Admitting: Oncology

## 2014-05-14 VITALS — BP 118/77 | HR 80 | Temp 98.6°F | Resp 20 | Ht 64.0 in | Wt 151.6 lb

## 2014-05-14 DIAGNOSIS — E440 Moderate protein-calorie malnutrition: Secondary | ICD-10-CM

## 2014-05-14 DIAGNOSIS — C259 Malignant neoplasm of pancreas, unspecified: Secondary | ICD-10-CM

## 2014-05-14 DIAGNOSIS — C50911 Malignant neoplasm of unspecified site of right female breast: Secondary | ICD-10-CM

## 2014-05-14 DIAGNOSIS — Z853 Personal history of malignant neoplasm of breast: Secondary | ICD-10-CM

## 2014-05-14 DIAGNOSIS — C251 Malignant neoplasm of body of pancreas: Secondary | ICD-10-CM

## 2014-05-14 DIAGNOSIS — L659 Nonscarring hair loss, unspecified: Secondary | ICD-10-CM

## 2014-05-14 DIAGNOSIS — Z5111 Encounter for antineoplastic chemotherapy: Secondary | ICD-10-CM

## 2014-05-14 DIAGNOSIS — M8580 Other specified disorders of bone density and structure, unspecified site: Secondary | ICD-10-CM

## 2014-05-14 DIAGNOSIS — K52831 Collagenous colitis: Secondary | ICD-10-CM

## 2014-05-14 DIAGNOSIS — I700 Atherosclerosis of aorta: Secondary | ICD-10-CM

## 2014-05-14 DIAGNOSIS — D735 Infarction of spleen: Secondary | ICD-10-CM

## 2014-05-14 DIAGNOSIS — D7389 Other diseases of spleen: Secondary | ICD-10-CM

## 2014-05-14 LAB — CBC WITH DIFFERENTIAL/PLATELET
BASO%: 1.3 % (ref 0.0–2.0)
BASOS ABS: 0 10*3/uL (ref 0.0–0.1)
EOS ABS: 0.1 10*3/uL (ref 0.0–0.5)
EOS%: 5.2 % (ref 0.0–7.0)
HCT: 44.6 % (ref 34.8–46.6)
HEMOGLOBIN: 15.4 g/dL (ref 11.6–15.9)
LYMPH#: 0.5 10*3/uL — AB (ref 0.9–3.3)
LYMPH%: 19.8 % (ref 14.0–49.7)
MCH: 33.8 pg (ref 25.1–34.0)
MCHC: 34.5 g/dL (ref 31.5–36.0)
MCV: 98 fL (ref 79.5–101.0)
MONO#: 0.3 10*3/uL (ref 0.1–0.9)
MONO%: 10.8 % (ref 0.0–14.0)
NEUT%: 62.9 % (ref 38.4–76.8)
NEUTROS ABS: 1.5 10*3/uL (ref 1.5–6.5)
Platelets: 192 10*3/uL (ref 145–400)
RBC: 4.55 10*6/uL (ref 3.70–5.45)
RDW: 15.2 % — AB (ref 11.2–14.5)
WBC: 2.3 10*3/uL — ABNORMAL LOW (ref 3.9–10.3)
nRBC: 3 % — ABNORMAL HIGH (ref 0–0)

## 2014-05-14 LAB — COMPREHENSIVE METABOLIC PANEL (CC13)
ALT: 39 U/L (ref 0–55)
ANION GAP: 11 meq/L (ref 3–11)
AST: 35 U/L — ABNORMAL HIGH (ref 5–34)
Albumin: 3.5 g/dL (ref 3.5–5.0)
Alkaline Phosphatase: 75 U/L (ref 40–150)
BILIRUBIN TOTAL: 0.48 mg/dL (ref 0.20–1.20)
BUN: 11.8 mg/dL (ref 7.0–26.0)
CHLORIDE: 106 meq/L (ref 98–109)
CO2: 25 meq/L (ref 22–29)
Calcium: 9.2 mg/dL (ref 8.4–10.4)
Creatinine: 0.7 mg/dL (ref 0.6–1.1)
Glucose: 150 mg/dl — ABNORMAL HIGH (ref 70–140)
Potassium: 4 mEq/L (ref 3.5–5.1)
SODIUM: 142 meq/L (ref 136–145)
TOTAL PROTEIN: 6.8 g/dL (ref 6.4–8.3)

## 2014-05-14 LAB — CANCER ANTIGEN 19-9: CA 19 9: 28.8 U/mL (ref <35.0)

## 2014-05-14 MED ORDER — HEPARIN SOD (PORK) LOCK FLUSH 100 UNIT/ML IV SOLN
500.0000 [IU] | Freq: Once | INTRAVENOUS | Status: AC | PRN
  Administered 2014-05-14: 500 [IU]
  Filled 2014-05-14: qty 5

## 2014-05-14 MED ORDER — SODIUM CHLORIDE 0.9 % IV SOLN
1000.0000 mg/m2 | Freq: Once | INTRAVENOUS | Status: AC
  Administered 2014-05-14: 1748 mg via INTRAVENOUS
  Filled 2014-05-14: qty 45.97

## 2014-05-14 MED ORDER — PROCHLORPERAZINE MALEATE 10 MG PO TABS
ORAL_TABLET | ORAL | Status: AC
  Filled 2014-05-14: qty 1

## 2014-05-14 MED ORDER — PROCHLORPERAZINE MALEATE 10 MG PO TABS
10.0000 mg | ORAL_TABLET | Freq: Once | ORAL | Status: AC
  Administered 2014-05-14: 10 mg via ORAL

## 2014-05-14 MED ORDER — SODIUM CHLORIDE 0.9 % IJ SOLN
10.0000 mL | INTRAMUSCULAR | Status: DC | PRN
  Administered 2014-05-14: 10 mL
  Filled 2014-05-14: qty 10

## 2014-05-14 MED ORDER — SODIUM CHLORIDE 0.9 % IV SOLN
Freq: Once | INTRAVENOUS | Status: AC
  Administered 2014-05-14: 10:00:00 via INTRAVENOUS

## 2014-05-14 NOTE — Addendum Note (Signed)
Addended by: Amelia Jo I on: 05/14/2014 05:27 PM   Modules accepted: Orders

## 2014-05-14 NOTE — Patient Instructions (Signed)
[facility] Discharge Instructions for Patients Receiving Chemotherapy  Today you received the following chemotherapy agents Gemzar.  To help prevent nausea and vomiting after your treatment, we encourage you to take your nausea medication.   If you develop nausea and vomiting that is not controlled by your nausea medication, call the clinic.   BELOW ARE SYMPTOMS THAT SHOULD BE REPORTED IMMEDIATELY:  *FEVER GREATER THAN 100.5 F  *CHILLS WITH OR WITHOUT FEVER  NAUSEA AND VOMITING THAT IS NOT CONTROLLED WITH YOUR NAUSEA MEDICATION  *UNUSUAL SHORTNESS OF BREATH  *UNUSUAL BRUISING OR BLEEDING  TENDERNESS IN MOUTH AND THROAT WITH OR WITHOUT PRESENCE OF ULCERS  *URINARY PROBLEMS  *BOWEL PROBLEMS  UNUSUAL RASH Items with * indicate a potential emergency and should be followed up as soon as possible.  Feel free to call the clinic you have any questions or concerns. The clinic phone number is [phone].    

## 2014-05-14 NOTE — Progress Notes (Signed)
[place]  Telephone:([phone]) [phone] Fax:(336) [phone]     ID: Annette Beltran OB: Jun 13, 1946  MR#: [id number]  URK#:[id number]  PCP: [name], MD GYN:   SU: [name] OTHER MD: Delfin Edis, [name]  CHIEF COMPLAINT: Early stage pancreatic cancer resected with positive margins  CURRENT TREATMENT: Adjuvant gemcitabine   PANCREATIC CANCER HISTORY: I formerly followed Annette Beltran for a stage I breast cancer, which was treated with surgery, radiation, and anti-estrogens. She was released from followup here in 2007. In addition, in 2012 she had abdominal pain leading to hospitalization. 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 The patient also has a history of collagenous colitis, which of course carries its own set of symptoms, making identification of her new attic problem more difficult.  Annette Beltran tells me she started "feeling bad" in September of 2014. There was abdominal discomfort localizing to the left upper o'clock on, worse with inspiration. CT angiography 08/21/2013 showed no clot and no significant abnormalities. Plain rib and left shoulder views were negative. CT abdomen and pelvis with contrast 08/22/2013 showed slight prominence of the pancreatic duct in the distal body and tail with a new small structure emanating from the tail of the pancreas consistent with a pseudocyst. Exudate near the tail of the pancreas suggested pancreatitis. MRI of the abdomen 09/03/2013 confirmed several ill-defined fluid collections between the gastric fundus and the splenic hilum surrounding the pancreatic tail. There was no evidence of a pancreatic mass. The pancreatic tail did enhanced following contrast. The splenic vein appeared chronically thrombosed with a small amount of  nonocclusive thrombus extending into the main and left portal veins.  On 09/26/2013 amylase was 237 and lipase 513.0, consistent with acute pancreatitis. These numbers subsequently dropped in on 11/28/2013 amylase was normal at 47 and lipase was mildly elevated at 110  With continuing left-sided abdominal discomfort, abdominal ultrasound 11/13/2013 showed multiple gallstones with no evidence of cholecystitis. Evaluation of the pancreas showed a hypoechoic lobulated focus measuring up to 7.4 cm felt to be nonspecific. Repeat abdominal CT 11/14/2013 showed diffuse pancreatic atrophy without evidence of a mass, again consistent with chronic pancreatitis. There was mild soft tissue stranding suspicious for mild superimposed acute pancreatitis. A new rim-enhancing fluid collection was noted in the gastrosplenic ligament measuring 4.5 cm, consistent with a pseudocyst. Finally on 11/23/2013, endoscopic ultrasonography under Oretha Caprice showed a subtle 2.5 cm soft tissue mass in the mid-pancreas, abutting the splenic vein. This was biopsied x2, with cytology (NZB [id number]) showing adenocarcinoma. A peripancreatic fluid collection measuring over 5 cm was aspirated yielding 25 cc of chocolate colored fluid. Two or three scattered round hypoechoic lymph nodes were noted near the pancreatic tail but were not sampled.  In addition, on 11/28/2013 a chest x-ray obtained to evaluate upper respiratory symptoms showed a small to moderate left effusion. Chest CT scan 11/29/2013 confirmed a moderate left-sided pleural effusion with no suspicious appearing pulmonary nodules or masses associated with it. The complex cystic lesion associated with the tail of the pancreas was again noted, measuring 5.5 cm despite the recent aspiration procedure.  The patient's subsequent history is as detailed below.  INTERVAL HISTORY: Annette Beltran returns today with her husband Annette Beltran for followup of her pancreatic cancer. Today is day 8 cycle 2 of 4  planned cycles of gemcitabine given days  1, 8, and 15 of each 21 day cycle  REVIEW OF SYSTEMS: Annette Beltran is tolerating the gemcitabine well, except that she has lost some hair. She is to verify that it might "all, out". Unfortunately I can neither predicts no prevent that. She hates the idea of weeks or even hats or bandannas so it may be difficult psychologically to deal with this problem. Aside from that, she has mild fatigue, and occasionally takes an afternoon nap. She is taking walks every evening however. She has had no diarrhea despite her history of colitis. In fact she might be slightly constipated. Her toes feel "funny", but this is not a change. She has some mouth irritation, and does not like the acyclovir but it seems to be working for her. Her port is working well. She has mild sinus symptoms and is already taking Claritin daily. A detailed review of systems today was otherwise noncontributory  PAST MEDICAL HISTORY: Past Medical History  Diagnosis Date  . Macular degeneration   . Fluid retention   . Colitis, collagenous   . Vitamin D deficiency   . Osteopenia   . Celiac artery stenosis   . Splenic infarction   . Arthritis   . History of blood clots   . Anxiety   . Shingles   . Pancreatitis   . Osteoporosis   . Clotting disorder   . Breast cancer     rt lumpectomy  . Arrhythmia     "skips a beat"  [place]  heart  . GERD (gastroesophageal reflux disease)     PAST SURGICAL HISTORY: Past Surgical History  Procedure Laterality Date  . Foot surgery  2003    x 3, 2 on right, 1 on left  . Breast lumpectomy Right     radiation for 6 weeks  . Meniscus repair Right   . Eus N/A 11/23/2013    Procedure: UPPER ENDOSCOPIC ULTRASOUND (EUS) LINEAR;  Surgeon: Milus Banister, MD;  Location: [facility];  Service: Endoscopy;  Laterality: N/A;  . Tonsillectomy    . Laparoscopy N/A 12/20/2013    Procedure: LAPAROSCOPY DIAGNOSTIC ;  Surgeon: [name], MD;  Location: [facility];  Service:  General;  Laterality: N/A;  . Cholecystectomy N/A 12/20/2013    Procedure: LAPAROSCOPIC CHOLECYSTECTOMY;  Surgeon: [name], MD;  Location: [facility];  Service: General;  Laterality: N/A;  . Splenectomy, total N/A 12/20/2013    Procedure: SPLENECTOMY;  Surgeon: [name], MD;  Location: [facility];  Service: General;  Laterality: N/A;  . Partial gastrectomy N/A 12/20/2013    Procedure: PARTIAL GASTRECTOMY;  Surgeon: [name], MD;  Location: [facility];  Service: General;  Laterality: N/A;  . Portacath placement N/A 01/15/2014    Procedure: INSERTION PORT-A-CATH;  Surgeon: [name], MD;  Location: [facility];  Service: General;  Laterality: N/A;    FAMILY HISTORY Family History  Problem Relation Age of Onset  . Colon cancer Neg Hx   . Aneurysm Mother   . Stroke Mother   . Prostate cancer Father 70  . Skin cancer Brother 7    treated with interferon for 1 year  . Heart failure Paternal Aunt   . Breast cancer Paternal Aunt     dx over 50s  . Pancreatic cancer Maternal Grandmother     dx in her 61s  . Breast cancer Maternal Aunt     dx over 28  . Rheum arthritis Brother   . Breast cancer Paternal Aunt     dx over 39s  .  Leukemia Paternal Aunt    the patient's father had a diagnosis of prostate cancer metastatic to the liver or a died at age 9. The patient's mother died secondary to carotid third brain aneurysm at the age of 67. The patient had 2 brothers, no sisters. One brother has a history of melanoma. The patient's mother is mother was diagnosed with pancreatic cancer in her 52s. There is a history of breast cancer and second degree relatives, none before the age of 80  GYNECOLOGIC HISTORY:  Menarche age 15, first live birth age 58, the patient is Annette Beltran P2.. Stopped in her early 73s. 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 Her husband of 64 years, Annette Beltran,  retired from [employer] and currently works part-time as a Cabin crew with [employer]. Daughter Annette Beltran teaches first grade. Daughter Annette Beltran is a homemaker. The patient has 4 grandchildren Annette Beltran has an additional 5 grandchildren of his own). The patient attends a [employer]    ADVANCED DIRECTIVES: In place   HEALTH MAINTENANCE: History  Substance Use Topics  . Smoking status: Former Smoker -- 0.50 packs/day for 28 years    Types: Cigarettes    Quit date: 11/02/2000  . Smokeless tobacco: Never Used  . Alcohol Use: Yes     Comment: occ wine      Colonoscopy:  PAP:  Bone density:  Lipid panel:  Mammography:  Allergies  Allergen Reactions  . Codeine Nausea And Vomiting  . Lactose Intolerance (Gi)   . Tequin     Severe stomach pain  . Penicillins Nausea Only and Rash    Current Outpatient Prescriptions  Medication Sig Dispense Refill  . ALPRAZolam (XANAX) 0.5 MG tablet Take 1 tablet (0.5 mg total) by mouth 2 (two) times daily as needed for anxiety.  60 tablet  5  . Ascorbic Acid (VITAMIN C PO) Take 1 tablet by mouth 2 (two) times daily.      Marland Kitchen aspirin 81 MG tablet Take 81 mg by mouth daily.      Marland Kitchen azithromycin (ZITHROMAX Z-PAK) 250 MG tablet Take 2 tablets (500 mg) on  Day 1,  followed by 1 tablet (250 mg) once daily on Days 2 through 5.  6 each  0  . b complex vitamins tablet Take 1 tablet by mouth daily.      . capecitabine (XELODA) 500 MG tablet Take two tablets twice daily on radiation days  120 tablet  0  . diphenhydrAMINE (BENADRYL) 25 MG tablet Take 25 mg by mouth at bedtime.       Marland Kitchen esomeprazole (NEXIUM) 20 MG capsule Take 20 mg by mouth daily at 12 noon.      . feeding supplement, RESOURCE BREEZE, (RESOURCE BREEZE) LIQD Take 1 Container by mouth 2 (two) times daily between meals.  30 Container  3  . furosemide (LASIX) 20 MG tablet Take 1 tablet (20 mg total) by mouth daily. Prn swelling  15 tablet  5  . lidocaine-prilocaine (EMLA) cream Apply 1-2 hours to port a  cath before procedure.  30 g  6  . lipase/protease/amylase (CREON-12/PANCREASE) 12000 UNITS CPEP capsule Take 2 capsules by mouth 3 (three) times daily with meals.  270 capsule  3  . loratadine (CLARITIN) 10 MG tablet Take 10 mg by mouth daily.      Marland Kitchen LORazepam (ATIVAN) 0.5 MG tablet Take 1  tablet (0.5 mg total) by mouth every 6 (six) hours as needed (Nausea or vomiting).  30 tablet  0  . naproxen sodium (ANAPROX) 220 MG tablet Take 220 mg by mouth 2 (two) times daily as needed (pain).       . ondansetron (ZOFRAN) 8 MG tablet Take 1 tablet (8 mg total) by mouth 2 (two) times daily as needed (Nausea or vomiting).  30 tablet  1  . PARoxetine (PAXIL) 10 MG tablet Take 1 tablet (10 mg total) by mouth daily.  30 tablet  5  . prochlorperazine (COMPAZINE) 10 MG tablet Take 1 tablet (10 mg total) by mouth every 6 (six) hours as needed (Nausea or vomiting).  30 tablet  1  . valACYclovir (VALTREX) 1000 MG tablet Take 1 tablet (1,000 mg total) by mouth 2 (two) times daily.  90 tablet  1   No current facility-administered medications for this visit.    OBJECTIVE: Middle-aged white woman in no acute distress Filed Vitals:   05/14/14 0835  BP: 118/77  Pulse: 80  Temp: 98.6 F (37 C)  Resp: 20     Body mass index is 26.01 kg/(m^2).    ECOG FS:1 - Symptomatic but completely ambulatory  Ocular: Sclerae unicteric, pupils round and equal Ear-nose-throat: Oropharynx slightly dry; I do not see any obvious sores or thrush Lymphatic: No cervical or supraclavicular adenopathy Lungs no rales or rhonchi Heart regular rate and rhythm Abd  soft, nontender in all quadrants, positive bowel sounds, no masses palpated MSK no focal spinal tenderness, no swelling or erythema or acral skin changes Neuro: non-focal, well-oriented, positive affect Breasts: Deferred  LAB RESULTS:  CMP     Component Value Date/Time   NA 142 05/14/2014 0821   NA 143 12/26/2013 0620   K 4.0 05/14/2014 0821   K 3.7 12/26/2013 0620   CL  101 12/26/2013 0620   CO2 25 05/14/2014 0821   CO2 32 12/26/2013 0620   GLUCOSE 150* 05/14/2014 0821   GLUCOSE 92 12/26/2013 0620   BUN 11.8 05/14/2014 0821   BUN 9 12/26/2013 0620   CREATININE 0.7 05/14/2014 0821   CREATININE 0.57 12/26/2013 0620   CREATININE 0.62 11/28/2013 1635   CALCIUM 9.2 05/14/2014 0821   CALCIUM 8.7 12/26/2013 0620   PROT 6.8 05/14/2014 0821   PROT 5.0* 12/21/2013 0450   ALBUMIN 3.5 05/14/2014 0821   ALBUMIN 2.3* 12/21/2013 0450   AST 35* 05/14/2014 0821   AST 42* 12/21/2013 0450   ALT 39 05/14/2014 0821   ALT 25 12/21/2013 0450   ALKPHOS 75 05/14/2014 0821   ALKPHOS 46 12/21/2013 0450   BILITOT 0.48 05/14/2014 0821   BILITOT 0.7 12/21/2013 0450   GFRNONAA >90 12/26/2013 0620   GFRAA >90 12/26/2013 0620     Ref. Range 01/08/2014 16:13 02/01/2014 08:21 02/22/2014 13:48 03/15/2014 14:59 05/07/2014 08:13  CA 19-9 Latest Range: <35.0 U/mL 17.9 25.1 23.8 26.8 30.5   I No results found for this basename: SPEP,  UPEP,   kappa and lambda light chains    Lab Results  Component Value Date   WBC 2.3* 05/14/2014   NEUTROABS 1.5 05/14/2014   HGB 15.4 05/14/2014   HCT 44.6 05/14/2014   MCV 98.0 05/14/2014   PLT 192 05/14/2014      Chemistry      Component Value Date/Time   NA 142 05/14/2014 0821   NA 143 12/26/2013 0620   K 4.0 05/14/2014 0821   K 3.7 12/26/2013 0620   CL 101  12/26/2013 0620   CO2 25 05/14/2014 0821   CO2 32 12/26/2013 0620   BUN 11.8 05/14/2014 0821   BUN 9 12/26/2013 0620   CREATININE 0.7 05/14/2014 0821   CREATININE 0.57 12/26/2013 0620   CREATININE 0.62 11/28/2013 1635      Component Value Date/Time   CALCIUM 9.2 05/14/2014 0821   CALCIUM 8.7 12/26/2013 0620   ALKPHOS 75 05/14/2014 0821   ALKPHOS 46 12/21/2013 0450   AST 35* 05/14/2014 0821   AST 42* 12/21/2013 0450   ALT 39 05/14/2014 0821   ALT 25 12/21/2013 0450   BILITOT 0.48 05/14/2014 0821   BILITOT 0.7 12/21/2013 0450       Lab Results  Component Value Date   LABCA2 8 06/08/2011    No components found with this  basename: [id number]    No results found for this basename: INR,  in the last 168 hours  Urinalysis    Component Value Date/Time   COLORURINE YELLOW 06/07/2011 1942   APPEARANCEUR CLEAR 06/07/2011 1942   LABSPEC 1.010 02/15/2014 1109   LABSPEC 1.009 06/07/2011 1942   PHURINE 6.0 06/07/2011 1942   GLUCOSEU Negative 02/15/2014 [place] 06/07/2011 1942   HGBUR NEGATIVE 06/07/2011 1942   BILIRUBINUR neg 12/25/2011 [place] 06/07/2011 1942   KETONESUR NEGATIVE 06/07/2011 1942   PROTEINUR neg 12/25/2011 1216   PROTEINUR NEGATIVE 06/07/2011 1942   UROBILINOGEN 0.2 02/15/2014 1109   UROBILINOGEN negative 12/25/2011 1216   UROBILINOGEN 0.2 06/07/2011 1942   NITRITE neg 12/25/2011 1216   NITRITE NEGATIVE 06/07/2011 1942   LEUKOCYTESUR Negative 12/25/2011 1216    STUDIES: No results found.  ASSESSMENT: 68 y.o. 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 noted on abdominal MRI November 2014  PANCREATIC CANCER: (3) status post endoscopic ultrasonography of the pancreas 11/23/2013 with cytology positive for adenocarcinoma, clinical stage T2 NX MX adenocarcinoma associated with a 5.5 cm pseudocyst (which was aspirated); baseline CA 19-9 was 47.0  (4) left pleural effusion status post thoracentesis 12/01/2013, cytology negative  (5) s/p laparoscopic cholecystectomy,  open extended distal pancreatectomy, splenectomy, partial gastrectomy x2 12/20/2013 for a pT1 pN0, stage IA invasive ductal adenocarcinoma, grade 2, with positive resection margins  (6) adjuvant gemcitabine started 02/01/2014-- received 3 weekly doses before radiation, will reeive 9 additional doses starting 3-5 weeks after completion of radiation  (7) adjuvant radiation started 02/27/2014, with radiosensitization (capecitabine 1000 mg po BID on radiation days); completed 04/06/2014  (8) adjuvant gemcitabine weekly resumed 05/07/2014, with 9 doses planned  PLAN: Kyna is tolerating the gemcitabine well. She is very concerned about the hair loss and hopefully she will not lose much more hair. I have offered her to start working on wigs but she hates to have anything on her head, she tells me, so the plan for now as far as the hair is concern is "prayer". Other side effects are  mild and we do not have to change the interval or dose of her treatment.  Of course what is worrying her the most right now is Annette Beltran. He would like me to set him up for a "second opinion" at [facility] and I am glad to do that. I will let his doctors here now.  Annette Beltran has a good understanding of the overall plan. She agrees with it. She knows the  goal of treatment in her case is cure. She will call with any problems that may develop before the next visit here.    Chauncey Cruel, MD   05/14/2014 9:00 AM

## 2014-05-17 ENCOUNTER — Ambulatory Visit: Payer: Medicare Other | Admitting: Radiation Oncology

## 2014-05-21 ENCOUNTER — Ambulatory Visit (HOSPITAL_BASED_OUTPATIENT_CLINIC_OR_DEPARTMENT_OTHER): Payer: Medicare Other | Admitting: Oncology

## 2014-05-21 ENCOUNTER — Ambulatory Visit (HOSPITAL_BASED_OUTPATIENT_CLINIC_OR_DEPARTMENT_OTHER): Payer: Medicare Other

## 2014-05-21 ENCOUNTER — Other Ambulatory Visit (HOSPITAL_BASED_OUTPATIENT_CLINIC_OR_DEPARTMENT_OTHER): Payer: Medicare Other

## 2014-05-21 VITALS — BP 147/65 | HR 55 | Temp 98.6°F | Resp 18 | Ht 64.0 in | Wt 151.5 lb

## 2014-05-21 DIAGNOSIS — K52831 Collagenous colitis: Secondary | ICD-10-CM

## 2014-05-21 DIAGNOSIS — Z853 Personal history of malignant neoplasm of breast: Secondary | ICD-10-CM

## 2014-05-21 DIAGNOSIS — C259 Malignant neoplasm of pancreas, unspecified: Secondary | ICD-10-CM

## 2014-05-21 DIAGNOSIS — C251 Malignant neoplasm of body of pancreas: Secondary | ICD-10-CM

## 2014-05-21 DIAGNOSIS — I700 Atherosclerosis of aorta: Secondary | ICD-10-CM

## 2014-05-21 DIAGNOSIS — Z452 Encounter for adjustment and management of vascular access device: Secondary | ICD-10-CM

## 2014-05-21 DIAGNOSIS — C50911 Malignant neoplasm of unspecified site of right female breast: Secondary | ICD-10-CM

## 2014-05-21 DIAGNOSIS — Z5111 Encounter for antineoplastic chemotherapy: Secondary | ICD-10-CM

## 2014-05-21 DIAGNOSIS — D7389 Other diseases of spleen: Secondary | ICD-10-CM

## 2014-05-21 LAB — COMPREHENSIVE METABOLIC PANEL (CC13)
ALT: 49 U/L (ref 0–55)
ANION GAP: 10 meq/L (ref 3–11)
AST: 48 U/L — ABNORMAL HIGH (ref 5–34)
Albumin: 3.4 g/dL — ABNORMAL LOW (ref 3.5–5.0)
Alkaline Phosphatase: 72 U/L (ref 40–150)
BILIRUBIN TOTAL: 0.24 mg/dL (ref 0.20–1.20)
BUN: 11.1 mg/dL (ref 7.0–26.0)
CO2: 25 meq/L (ref 22–29)
Calcium: 9.4 mg/dL (ref 8.4–10.4)
Chloride: 109 mEq/L (ref 98–109)
Creatinine: 0.7 mg/dL (ref 0.6–1.1)
GLUCOSE: 185 mg/dL — AB (ref 70–140)
Potassium: 3.9 mEq/L (ref 3.5–5.1)
Sodium: 143 mEq/L (ref 136–145)
TOTAL PROTEIN: 6.8 g/dL (ref 6.4–8.3)

## 2014-05-21 LAB — CBC WITH DIFFERENTIAL/PLATELET
BASO%: 2.5 % — ABNORMAL HIGH (ref 0.0–2.0)
BASOS ABS: 0.1 10*3/uL (ref 0.0–0.1)
EOS%: 3.5 % (ref 0.0–7.0)
Eosinophils Absolute: 0.1 10*3/uL (ref 0.0–0.5)
HEMATOCRIT: 33.3 % — AB (ref 34.8–46.6)
HEMOGLOBIN: 12 g/dL (ref 11.6–15.9)
LYMPH#: 0.5 10*3/uL — AB (ref 0.9–3.3)
LYMPH%: 25.2 % (ref 14.0–49.7)
MCH: 34.9 pg — AB (ref 25.1–34.0)
MCHC: 36 g/dL (ref 31.5–36.0)
MCV: 96.8 fL (ref 79.5–101.0)
MONO#: 0.3 10*3/uL (ref 0.1–0.9)
MONO%: 13.9 % (ref 0.0–14.0)
NEUT#: 1.1 10*3/uL — ABNORMAL LOW (ref 1.5–6.5)
NEUT%: 54.9 % (ref 38.4–76.8)
Platelets: 184 10*3/uL (ref 145–400)
RBC: 3.44 10*6/uL — ABNORMAL LOW (ref 3.70–5.45)
RDW: 14.6 % — ABNORMAL HIGH (ref 11.2–14.5)
WBC: 2 10*3/uL — ABNORMAL LOW (ref 3.9–10.3)
nRBC: 6 % — ABNORMAL HIGH (ref 0–0)

## 2014-05-21 LAB — CANCER ANTIGEN 19-9: CA 19-9: 23.9 U/mL (ref <35.0)

## 2014-05-21 MED ORDER — PROCHLORPERAZINE MALEATE 10 MG PO TABS
ORAL_TABLET | ORAL | Status: AC
  Filled 2014-05-21: qty 1

## 2014-05-21 MED ORDER — SODIUM CHLORIDE 0.9 % IV SOLN
1000.0000 mg/m2 | Freq: Once | INTRAVENOUS | Status: AC
  Administered 2014-05-21: 1748 mg via INTRAVENOUS
  Filled 2014-05-21: qty 45.97

## 2014-05-21 MED ORDER — ALTEPLASE 2 MG IJ SOLR
2.0000 mg | Freq: Once | INTRAMUSCULAR | Status: AC | PRN
  Administered 2014-05-21: 2 mg
  Filled 2014-05-21: qty 2

## 2014-05-21 MED ORDER — PROCHLORPERAZINE MALEATE 10 MG PO TABS
10.0000 mg | ORAL_TABLET | Freq: Once | ORAL | Status: AC
  Administered 2014-05-21: 10 mg via ORAL

## 2014-05-21 NOTE — Progress Notes (Signed)
OK to treat today with ANC 1.1 per Dr. Jana Hakim.   No blood return from [facility] after multiple attempts by 2 RNs.  Instilled Cath Flo at 10:30 am.   No blood return at 11 am.  Offered pt option of starting PIV for Gemzar and she would like to wait another 30 minutes.   No blood return from [facility] at 11:30 am.   Pt agreed to PIV start for chemotherapy today.  Will continue to leave Cath flo in [facility] and monitor for blood return every 30 minutes per protocol.  12 pm; No blood return via PAC.  12:30 pm;  Still no blood return after Cath flo instilling for 2 hrs.  Dr. Jana Hakim notified and pt scheduled for Dye study on Wednesday.  Pt aware of appt date and time.  Cath flo removed from [facility] and then port flushed per protocol.

## 2014-05-21 NOTE — Addendum Note (Signed)
Addended by: Amelia Jo I on: 05/21/2014 01:02 PM   Modules accepted: Medications

## 2014-05-21 NOTE — Progress Notes (Signed)
[place]  [phone] [phone]     ID: DIERRA RIESGO OB: 70-Dec-1947  MR#: [id number]  IHK#:[id number]  PCP: [name], MD GYN:   SU: [name] OTHER MD: Delfin Edis, [name]  CHIEF COMPLAINT: Early stage pancreatic cancer resected with positive margins  CURRENT TREATMENT: Adjuvant gemcitabine   PANCREATIC CANCER HISTORY: I formerly followed [name] for a stage I breast cancer, which was treated with surgery, radiation, and anti-estrogens. She was released from followup here in 2007. In addition, in 2012 she had abdominal pain leading to hospitalization. 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 The patient also has a history of collagenous colitis, which of course carries its own set of symptoms, making identification of her new attic problem more difficult.  Valia tells me she started "feeling bad" in September of 2014. There was abdominal discomfort localizing to the left upper o'clock on, worse with inspiration. CT angiography 08/21/2013 showed no clot and no significant abnormalities. Plain rib and left shoulder views were negative. CT abdomen and pelvis with contrast 08/22/2013 showed slight prominence of the pancreatic duct in the distal body and tail with a new small structure emanating from the tail of the pancreas consistent with a pseudocyst. Exudate near the tail of the pancreas suggested pancreatitis. MRI of the abdomen 09/03/2013 confirmed several ill-defined fluid collections between the gastric fundus and the splenic hilum surrounding the pancreatic tail. There was no evidence of a pancreatic mass. The pancreatic tail did enhanced following contrast. The splenic vein appeared chronically thrombosed with a small amount of  nonocclusive thrombus extending into the main and left portal veins.  On 09/26/2013 amylase was 237 and lipase 513.0, consistent with acute pancreatitis. These numbers subsequently dropped in on 11/28/2013 amylase was normal at 47 and lipase was mildly elevated at 110  With continuing left-sided abdominal discomfort, abdominal ultrasound 11/13/2013 showed multiple gallstones with no evidence of cholecystitis. Evaluation of the pancreas showed a hypoechoic lobulated focus measuring up to 7.4 cm felt to be nonspecific. Repeat abdominal CT 11/14/2013 showed diffuse pancreatic atrophy without evidence of a mass, again consistent with chronic pancreatitis. There was mild soft tissue stranding suspicious for mild superimposed acute pancreatitis. A new rim-enhancing fluid collection was noted in the gastrosplenic ligament measuring 4.5 cm, consistent with a pseudocyst. Finally on 11/23/2013, endoscopic ultrasonography under Oretha Caprice showed a subtle 2.5 cm soft tissue mass in the mid-pancreas, abutting the splenic vein. This was biopsied x2, with cytology (NZB [id number]) showing adenocarcinoma. A peripancreatic fluid collection measuring over 5 cm was aspirated yielding 25 cc of chocolate colored fluid. Two or three scattered round hypoechoic lymph nodes were noted near the pancreatic tail but were not sampled.  In addition, on 11/28/2013 a chest x-ray obtained to evaluate upper respiratory symptoms showed a small to moderate left effusion. Chest CT scan 11/29/2013 confirmed a moderate left-sided pleural effusion with no suspicious appearing pulmonary nodules or masses associated with it. The complex cystic lesion associated with the tail of the pancreas was again noted, measuring 5.5 cm despite the recent aspiration procedure.  The patient's subsequent history is as detailed below.  INTERVAL HISTORY: Ardell returns today with her husband [name] and their daughter for followup of Marycruz's pancreatic cancer. Today is  day 15 cycle 2 of 4 planned cycles of  gemcitabine given days 1, 8, and 15 of each 21 day cycle. A second problem is [name] use advanced liposarcoma. We have contacted Dr[facility]Riedel at [facility] and they are waiting to hear regarding an appointment. Tentatively [name] has an appointment with me on the 21st of August and he also has an appointment with Dr. Tammi Klippel in radiation oncology before he goes to [place]: Ellanora is doing well as far as the Gemzar is concerned. She says she has some numbness over her toes. This would be a very unusual side effect from Gemzar. She tends to feel it more at night. There is no tingling or pain. It doesn't affect her walking. She has had a little bit more hair loss. A detailed review of systems today is otherwise noncontributory   PAST MEDICAL HISTORY: Past Medical History  Diagnosis Date  . Macular degeneration   . Fluid retention   . Colitis, collagenous   . Vitamin D deficiency   . Osteopenia   . Celiac artery stenosis   . Splenic infarction   . Arthritis   . History of blood clots   . Anxiety   . Shingles   . Pancreatitis   . Osteoporosis   . Clotting disorder   . Breast cancer     rt lumpectomy  . Arrhythmia     "skips a beat"  [place]  heart  . GERD (gastroesophageal reflux disease)     PAST SURGICAL HISTORY: Past Surgical History  Procedure Laterality Date  . Foot surgery  2003    x 3, 2 on right, 1 on left  . Breast lumpectomy Right     radiation for 6 weeks  . Meniscus repair Right   . Eus N/A 11/23/2013    Procedure: UPPER ENDOSCOPIC ULTRASOUND (EUS) LINEAR;  Surgeon: Milus Banister, MD;  Location: [facility];  Service: Endoscopy;  Laterality: N/A;  . Tonsillectomy    . Laparoscopy N/A 12/20/2013    Procedure: LAPAROSCOPY DIAGNOSTIC ;  Surgeon: [name], MD;  Location: [place];  Service: General;  Laterality: N/A;  . Cholecystectomy N/A 12/20/2013    Procedure: LAPAROSCOPIC CHOLECYSTECTOMY;  Surgeon: [name], MD;   Location: [place];  Service: General;  Laterality: N/A;  . Splenectomy, total N/A 12/20/2013    Procedure: SPLENECTOMY;  Surgeon: [name], MD;  Location: [place];  Service: General;  Laterality: N/A;  . Partial gastrectomy N/A 12/20/2013    Procedure: PARTIAL GASTRECTOMY;  Surgeon: [name], MD;  Location: [place];  Service: General;  Laterality: N/A;  . Portacath placement N/A 01/15/2014    Procedure: INSERTION PORT-A-CATH;  Surgeon: [name], MD;  Location: [facility];  Service: General;  Laterality: N/A;    FAMILY HISTORY Family History  Problem Relation Age of Onset  . Colon cancer Neg Hx   . Aneurysm Mother   . Stroke Mother   . Prostate cancer Father 32  . Skin cancer Brother 77    treated with interferon for 1 year  . Heart failure Paternal Aunt   . Breast cancer Paternal Aunt     dx over 3s  . Pancreatic cancer Maternal Grandmother     dx in her 33s  . Breast cancer Maternal Aunt     dx over 27  . Rheum arthritis Brother   . Breast cancer Paternal Aunt     dx over 32s  . Leukemia Paternal Aunt    the patient's father had a diagnosis of prostate cancer metastatic to the  liver or a died at age 88. The patient's mother died secondary to carotid third brain aneurysm at the age of 61. The patient had 2 brothers, no sisters. One brother has a history of melanoma. The patient's mother is mother was diagnosed with pancreatic cancer in her 39s. There is a history of breast cancer and second degree relatives, none before the age of 20  GYNECOLOGIC HISTORY:  Menarche age 50, first live birth age 88, the patient is [place] P2.. Stopped in her early 14s. 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 Her husband of 41 years, [name], retired from [employer] and currently works part-time as a Cabin crew with [employer]. Daughter Marita [place] teaches first grade. Daughter Leafy Ro  is a homemaker. The patient has 4 grandchildren [name] has an additional 5 grandchildren of his own). The patient attends a [employer]    ADVANCED DIRECTIVES: In place   HEALTH MAINTENANCE: History  Substance Use Topics  . Smoking status: Former Smoker -- 0.50 packs/day for 28 years    Types: Cigarettes    Quit date: 11/02/2000  . Smokeless tobacco: Never Used  . Alcohol Use: Yes     Comment: occ wine      Colonoscopy:  PAP:  Bone density:  Lipid panel:  Mammography:  Allergies  Allergen Reactions  . Codeine Nausea And Vomiting  . Lactose Intolerance (Gi)   . Tequin     Severe stomach pain  . Penicillins Nausea Only and Rash    Current Outpatient Prescriptions  Medication Sig Dispense Refill  . ALPRAZolam (XANAX) 0.5 MG tablet Take 1 tablet (0.5 mg total) by mouth 2 (two) times daily as needed for anxiety.  60 tablet  5  . Ascorbic Acid (VITAMIN C PO) Take 1 tablet by mouth 2 (two) times daily.      Marland Kitchen aspirin 81 MG tablet Take 81 mg by mouth daily.      Marland Kitchen azithromycin (ZITHROMAX Z-PAK) 250 MG tablet Take 2 tablets (500 mg) on  Day 1,  followed by 1 tablet (250 mg) once daily on Days 2 through 5.  6 each  0  . b complex vitamins tablet Take 1 tablet by mouth daily.      . diphenhydrAMINE (BENADRYL) 25 MG tablet Take 25 mg by mouth at bedtime.       . feeding supplement, RESOURCE BREEZE, (RESOURCE BREEZE) LIQD Take 1 Container by mouth 2 (two) times daily between meals.  30 Container  3  . furosemide (LASIX) 20 MG tablet Take 1 tablet (20 mg total) by mouth daily. Prn swelling  15 tablet  5  . lidocaine-prilocaine (EMLA) cream Apply 1-2 hours to port a cath before procedure.  30 g  6  . lipase/protease/amylase (CREON-12/PANCREASE) 12000 UNITS CPEP capsule Take 2 capsules by mouth 3 (three) times daily with meals.  270 capsule  3  . loratadine (CLARITIN) 10 MG tablet Take 10 mg by mouth daily.      . ondansetron (ZOFRAN) 8 MG tablet Take 1 tablet (8 mg total) by  mouth 2 (two) times daily as needed (Nausea or vomiting).  30 tablet  1  . PARoxetine (PAXIL) 10 MG tablet Take 1 tablet (10 mg total) by mouth daily.  30 tablet  5  . prochlorperazine (COMPAZINE) 10 MG tablet Take 1 tablet (10 mg total) by mouth every 6 (  six) hours as needed (Nausea or vomiting).  30 tablet  1  . valACYclovir (VALTREX) 1000 MG tablet Take 1 tablet (1,000 mg total) by mouth 2 (two) times daily.  90 tablet  1   No current facility-administered medications for this visit.    OBJECTIVE: Middle-aged white woman who appears stated age  68 Vitals:   05/21/14 0837  BP: 147/65  Pulse: 55  Temp: 98.6 F (37 C)  Resp: 18     Body mass index is 25.99 kg/(m^2).    ECOG FS:1 - Symptomatic but completely ambulatory  Ocular: Sclerae unicteric, EOMs intact  Ear-nose-throat: Oropharynx  clear  Lymphatic: No cervical or supraclavicular adenopathy Lungs no rales or rhonchi Heart regular rate and rhythm Abd  soft, nontender in all quadrants, positive bowel sounds, no masses palpated MSK no focal spinal tenderness, no swelling or erythema or acral skin changes Neuro: non-focal, well-oriented, positive affect Breasts: Deferred  LAB RESULTS:  CMP     Component Value Date/Time   NA 143 05/21/2014 0822   NA 143 12/26/2013 0620   K 3.9 05/21/2014 0822   K 3.7 12/26/2013 0620   CL 101 12/26/2013 0620   CO2 25 05/21/2014 0822   CO2 32 12/26/2013 0620   GLUCOSE 185* 05/21/2014 0822   GLUCOSE 92 12/26/2013 0620   BUN 11.1 05/21/2014 0822   BUN 9 12/26/2013 0620   CREATININE 0.7 05/21/2014 0822   CREATININE 0.57 12/26/2013 0620   CREATININE 0.62 11/28/2013 1635   CALCIUM 9.4 05/21/2014 0822   CALCIUM 8.7 12/26/2013 0620   PROT 6.8 05/21/2014 0822   PROT 5.0* 12/21/2013 0450   ALBUMIN 3.4* 05/21/2014 0822   ALBUMIN 2.3* 12/21/2013 0450   AST 48* 05/21/2014 0822   AST 42* 12/21/2013 0450   ALT 49 05/21/2014 0822   ALT 25 12/21/2013 0450   ALKPHOS 72 05/21/2014 0822   ALKPHOS 46 12/21/2013 0450    BILITOT 0.24 05/21/2014 0822   BILITOT 0.7 12/21/2013 0450   GFRNONAA >90 12/26/2013 0620   GFRAA >90 12/26/2013 0620    I No results found for this basename: SPEP,  UPEP,   kappa and lambda light chains    Lab Results  Component Value Date   WBC 2.0* 05/21/2014   NEUTROABS 1.1* 05/21/2014   HGB 12.0 05/21/2014   HCT 33.3* 05/21/2014   MCV 96.8 05/21/2014   PLT 184 05/21/2014      Chemistry      Component Value Date/Time   NA 143 05/21/2014 0822   NA 143 12/26/2013 0620   K 3.9 05/21/2014 0822   K 3.7 12/26/2013 0620   CL 101 12/26/2013 0620   CO2 25 05/21/2014 0822   CO2 32 12/26/2013 0620   BUN 11.1 05/21/2014 0822   BUN 9 12/26/2013 0620   CREATININE 0.7 05/21/2014 0822   CREATININE 0.57 12/26/2013 0620   CREATININE 0.62 11/28/2013 1635      Component Value Date/Time   CALCIUM 9.4 05/21/2014 0822   CALCIUM 8.7 12/26/2013 0620   ALKPHOS 72 05/21/2014 0822   ALKPHOS 46 12/21/2013 0450   AST 48* 05/21/2014 0822   AST 42* 12/21/2013 0450   ALT 49 05/21/2014 0822   ALT 25 12/21/2013 0450   BILITOT 0.24 05/21/2014 0822   BILITOT 0.7 12/21/2013 0450     Results for LOUVINA, CLEARY (MRN [id number]) as of 05/21/2014 09:16  Ref. Range 02/01/2014 08:21 02/22/2014 13:48 03/15/2014 14:59 05/07/2014 08:13 05/14/2014 08:21  CA 19-9 Latest Range: <35.0 U/mL  25.1 23.8 26.8 30.5 28.8    Lab Results  Component Value Date   LABCA2 8 06/08/2011    No components found with this basename: [id number]    No results found for this basename: INR,  in the last 168 hours  Urinalysis    Component Value Date/Time   COLORURINE YELLOW 06/07/2011 1942   APPEARANCEUR CLEAR 06/07/2011 1942   LABSPEC 1.010 02/15/2014 1109   LABSPEC 1.009 06/07/2011 1942   PHURINE 6.0 06/07/2011 1942   GLUCOSEU Negative 02/15/2014 [place] 06/07/2011 1942   HGBUR NEGATIVE 06/07/2011 1942   BILIRUBINUR neg 12/25/2011 [place] 06/07/2011 1942   KETONESUR NEGATIVE 06/07/2011 1942   PROTEINUR neg 12/25/2011 1216    PROTEINUR NEGATIVE 06/07/2011 1942   UROBILINOGEN 0.2 02/15/2014 1109   UROBILINOGEN negative 12/25/2011 1216   UROBILINOGEN 0.2 06/07/2011 1942   NITRITE neg 12/25/2011 1216   NITRITE NEGATIVE 06/07/2011 1942   LEUKOCYTESUR Negative 12/25/2011 1216    STUDIES: No results found.  ASSESSMENT: 68 y.o. 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 noted on abdominal MRI November 2014  PANCREATIC CANCER: (3) status post endoscopic ultrasonography of the pancreas 11/23/2013 with cytology positive for adenocarcinoma, clinical stage T2 NX MX adenocarcinoma associated with a 5.5 cm pseudocyst (which was aspirated); baseline CA 19-9 was 47.0  (4) left pleural effusion status post thoracentesis 12/01/2013, cytology negative  (5) s/p laparoscopic cholecystectomy, open extended distal pancreatectomy, splenectomy, partial gastrectomy x2 12/20/2013 for a pT1 pN0, stage IA invasive ductal adenocarcinoma, grade 2, with positive resection margins  (6) adjuvant gemcitabine started 02/01/2014-- received 3 weekly doses before radiation, will reeive 9 additional doses starting 3-5 weeks after completion of radiation  (7) adjuvant radiation started 02/27/2014, with radiosensitization (capecitabine 1000 mg po BID on radiation days); completed 04/06/2014  (8) adjuvant gemcitabine weekly resumed  05/07/2014, with 9 doses planned  PLAN: [place] is doing well as far as the Gemzar is concerned. She may indeed continue to lose hair, but right now the hair loss is not cosmetically apparent. The counts are dropping and it may be that we will need to hold her next treatment or start using Neupogen.  She has a "show" August 10. She would prefer not to be treated that week. We can certainly give her a break that week but we are going to make the decision as become a little closer to that date.  They are going to make sure [name] has an appointment at [facility] sometime this week or the latest next week. Otherwise I will let me know.     Chauncey Cruel, MD   05/21/2014 9:17 AM

## 2014-05-21 NOTE — Patient Instructions (Addendum)
[place] Discharge Instructions for Patients Receiving Chemotherapy  Today you received the following chemotherapy agents; Gemzar.   To help prevent nausea and vomiting after your treatment, we encourage you to take your nausea medication as directed.    If you develop nausea and vomiting that is not controlled by your nausea medication, call the clinic.   BELOW ARE SYMPTOMS THAT SHOULD BE REPORTED IMMEDIATELY:  *FEVER GREATER THAN 100.5 F  *CHILLS WITH OR WITHOUT FEVER  NAUSEA AND VOMITING THAT IS NOT CONTROLLED WITH YOUR NAUSEA MEDICATION  *UNUSUAL SHORTNESS OF BREATH  *UNUSUAL BRUISING OR BLEEDING  TENDERNESS IN MOUTH AND THROAT WITH OR WITHOUT PRESENCE OF ULCERS  *URINARY PROBLEMS  *BOWEL PROBLEMS  UNUSUAL RASH Items with * indicate a potential emergency and should be followed up as soon as possible.  Feel free to call the clinic should you have any questions or concerns. The clinic phone number is [phone].

## 2014-05-23 ENCOUNTER — Other Ambulatory Visit (HOSPITAL_COMMUNITY): Payer: Medicare Other

## 2014-05-28 ENCOUNTER — Ambulatory Visit (HOSPITAL_COMMUNITY)
Admission: RE | Admit: 2014-05-28 | Discharge: 2014-05-28 | Disposition: A | Discharge status: Home / Self Care | Payer: Medicare Other | Source: Ambulatory Visit | Attending: Oncology | Admitting: Oncology

## 2014-05-28 ENCOUNTER — Telehealth (INDEPENDENT_AMBULATORY_CARE_PROVIDER_SITE_OTHER): Payer: Self-pay

## 2014-05-28 ENCOUNTER — Other Ambulatory Visit (HOSPITAL_BASED_OUTPATIENT_CLINIC_OR_DEPARTMENT_OTHER): Payer: Medicare Other

## 2014-05-28 ENCOUNTER — Ambulatory Visit (HOSPITAL_BASED_OUTPATIENT_CLINIC_OR_DEPARTMENT_OTHER): Payer: Medicare Other

## 2014-05-28 ENCOUNTER — Other Ambulatory Visit: Payer: Self-pay | Admitting: Oncology

## 2014-05-28 ENCOUNTER — Telehealth: Payer: Self-pay | Admitting: *Deleted

## 2014-05-28 ENCOUNTER — Ambulatory Visit (HOSPITAL_BASED_OUTPATIENT_CLINIC_OR_DEPARTMENT_OTHER): Payer: Medicare Other | Admitting: Oncology

## 2014-05-28 ENCOUNTER — Other Ambulatory Visit: Payer: Self-pay | Admitting: *Deleted

## 2014-05-28 VITALS — BP 142/86 | HR 45 | Temp 98.2°F | Resp 18 | Ht 64.0 in | Wt 152.9 lb

## 2014-05-28 DIAGNOSIS — M8580 Other specified disorders of bone density and structure, unspecified site: Secondary | ICD-10-CM

## 2014-05-28 DIAGNOSIS — C251 Malignant neoplasm of body of pancreas: Secondary | ICD-10-CM

## 2014-05-28 DIAGNOSIS — T82598A Other mechanical complication of other cardiac and vascular devices and implants, initial encounter: Secondary | ICD-10-CM | POA: Insufficient documentation

## 2014-05-28 DIAGNOSIS — I700 Atherosclerosis of aorta: Secondary | ICD-10-CM

## 2014-05-28 DIAGNOSIS — C252 Malignant neoplasm of tail of pancreas: Secondary | ICD-10-CM

## 2014-05-28 DIAGNOSIS — D490 Neoplasm of unspecified behavior of digestive system: Secondary | ICD-10-CM | POA: Insufficient documentation

## 2014-05-28 DIAGNOSIS — Y849 Medical procedure, unspecified as the cause of abnormal reaction of the patient, or of later complication, without mention of misadventure at the time of the procedure: Secondary | ICD-10-CM | POA: Diagnosis not present

## 2014-05-28 DIAGNOSIS — D735 Infarction of spleen: Secondary | ICD-10-CM

## 2014-05-28 DIAGNOSIS — Z853 Personal history of malignant neoplasm of breast: Secondary | ICD-10-CM

## 2014-05-28 DIAGNOSIS — C50911 Malignant neoplasm of unspecified site of right female breast: Secondary | ICD-10-CM

## 2014-05-28 DIAGNOSIS — Z5111 Encounter for antineoplastic chemotherapy: Secondary | ICD-10-CM

## 2014-05-28 DIAGNOSIS — K52831 Collagenous colitis: Secondary | ICD-10-CM

## 2014-05-28 DIAGNOSIS — E440 Moderate protein-calorie malnutrition: Secondary | ICD-10-CM

## 2014-05-28 LAB — CBC WITH DIFFERENTIAL/PLATELET
BASO%: 1.8 % (ref 0.0–2.0)
Basophils Absolute: 0 10*3/uL (ref 0.0–0.1)
EOS ABS: 0.1 10*3/uL (ref 0.0–0.5)
EOS%: 2.9 % (ref 0.0–7.0)
HCT: 29.4 % — ABNORMAL LOW (ref 34.8–46.6)
HGB: 10.5 g/dL — ABNORMAL LOW (ref 11.6–15.9)
LYMPH%: 21.8 % (ref 14.0–49.7)
MCH: 35 pg — ABNORMAL HIGH (ref 25.1–34.0)
MCHC: 35.7 g/dL (ref 31.5–36.0)
MCV: 98 fL (ref 79.5–101.0)
MONO#: 0.3 10*3/uL (ref 0.1–0.9)
MONO%: 17.1 % — AB (ref 0.0–14.0)
NEUT%: 56.4 % (ref 38.4–76.8)
NEUTROS ABS: 1 10*3/uL — AB (ref 1.5–6.5)
PLATELETS: 303 10*3/uL (ref 145–400)
RBC: 3 10*6/uL — AB (ref 3.70–5.45)
RDW: 14.6 % — AB (ref 11.2–14.5)
WBC: 1.7 10*3/uL — AB (ref 3.9–10.3)
lymph#: 0.4 10*3/uL — ABNORMAL LOW (ref 0.9–3.3)
nRBC: 9 % — ABNORMAL HIGH (ref 0–0)

## 2014-05-28 LAB — TECHNOLOGIST REVIEW

## 2014-05-28 LAB — CANCER ANTIGEN 19-9: CA 19-9: 21 U/mL (ref <35.0)

## 2014-05-28 MED ORDER — PROCHLORPERAZINE MALEATE 10 MG PO TABS
10.0000 mg | ORAL_TABLET | Freq: Once | ORAL | Status: AC
  Administered 2014-05-28: 10 mg via ORAL

## 2014-05-28 MED ORDER — SODIUM CHLORIDE 0.9 % IV SOLN
Freq: Once | INTRAVENOUS | Status: AC
  Administered 2014-05-28: 11:00:00 via INTRAVENOUS

## 2014-05-28 MED ORDER — HEPARIN SOD (PORK) LOCK FLUSH 100 UNIT/ML IV SOLN
500.0000 [IU] | Freq: Once | INTRAVENOUS | Status: AC | PRN
  Administered 2014-05-28: 500 [IU]
  Filled 2014-05-28: qty 5

## 2014-05-28 MED ORDER — SODIUM CHLORIDE 0.9 % IV SOLN
1000.0000 mg/m2 | Freq: Once | INTRAVENOUS | Status: AC
  Administered 2014-05-28: 1748 mg via INTRAVENOUS
  Filled 2014-05-28: qty 45.97

## 2014-05-28 MED ORDER — PROCHLORPERAZINE MALEATE 10 MG PO TABS
ORAL_TABLET | ORAL | Status: AC
  Filled 2014-05-28: qty 1

## 2014-05-28 MED ORDER — IOHEXOL 300 MG/ML  SOLN
10.0000 mL | Freq: Once | INTRAMUSCULAR | Status: AC | PRN
  Administered 2014-05-28: 10 mL

## 2014-05-28 MED ORDER — SODIUM CHLORIDE 0.9 % IJ SOLN
10.0000 mL | INTRAMUSCULAR | Status: DC | PRN
  Administered 2014-05-28: 10 mL
  Filled 2014-05-28: qty 10

## 2014-05-28 NOTE — Progress Notes (Signed)
Ok to treat with ANC 1.0 and WBC 1.7 per Dr. Jana Hakim.  Ok to treat with port placed in innominate vein per Dr. Vernard Gambles, Radiologist and Dr. Jana Hakim.  Ok to proceed with Chemotherapy without CMET today per Dr.Magrinat

## 2014-05-28 NOTE — Telephone Encounter (Signed)
Pt will need PAC repositioned.  Ok by Dr. Barry Dienes to have done in IR.  LM for Dr. Virgie Dad nurse, Mateo Flow.

## 2014-05-28 NOTE — Telephone Encounter (Signed)
This RN spoke with IR per need to schedule pt for revision of port-noted placement by Dr Barry Dienes. Requested to obtain ok by Dr Barry Dienes for IR to proceed with revision   This RN spoke with Jearld Fenton with Dr Barry Dienes and received ok per MD for IR to revise port.  This RN called ok to Tiffany in IR.

## 2014-05-28 NOTE — Progress Notes (Signed)
[place]  [phone] Fax:(336) [phone]     ID: Annette Beltran OB: January 09, 1946  MR#: [id number]  ZJI#:[id number]  PCP: [name], MD GYN:   SU: Annette Beltran OTHER MD: Annette Beltran, Annette Beltran  CHIEF COMPLAINT: Early stage pancreatic cancer resected with positive margins  CURRENT TREATMENT: Adjuvant gemcitabine   PANCREATIC CANCER HISTORY: From the original intake note:  I formerly followed Annette Beltran for a stage I breast cancer, which was treated with surgery, radiation, and anti-estrogens. She was released from followup here in 2007. In addition, in 2012 she had abdominal pain leading to hospitalization. 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 The patient also has a history of collagenous colitis, which of course carries its own set of symptoms, making identification of her new attic problem more difficult.  Annette Beltran tells me she started "feeling bad" in September of 2014. There was abdominal discomfort localizing to the left upper o'clock on, worse with inspiration. CT angiography 08/21/2013 showed no clot and no significant abnormalities. Plain rib and left shoulder views were negative. CT abdomen and pelvis with contrast 08/22/2013 showed slight prominence of the pancreatic duct in the distal body and tail with a new small structure emanating from the tail of the pancreas consistent with a pseudocyst. Exudate near the tail of the pancreas suggested pancreatitis. MRI of the abdomen 09/03/2013 confirmed several ill-defined fluid collections between the gastric fundus and the splenic hilum surrounding the pancreatic tail. There was no evidence of a pancreatic mass. The pancreatic tail did enhanced following contrast. The splenic vein appeared chronically  thrombosed with a small amount of nonocclusive thrombus extending into the main and left portal veins.  On 09/26/2013 amylase was 237 and lipase 513.0, consistent with acute pancreatitis. These numbers subsequently dropped in on 11/28/2013 amylase was normal at 47 and lipase was mildly elevated at 110  With continuing left-sided abdominal discomfort, abdominal ultrasound 11/13/2013 showed multiple gallstones with no evidence of cholecystitis. Evaluation of the pancreas showed a hypoechoic lobulated focus measuring up to 7.4 cm felt to be nonspecific. Repeat abdominal CT 11/14/2013 showed diffuse pancreatic atrophy without evidence of a mass, again consistent with chronic pancreatitis. There was mild soft tissue stranding suspicious for mild superimposed acute pancreatitis. A new rim-enhancing fluid collection was noted in the gastrosplenic ligament measuring 4.5 cm, consistent with a pseudocyst. Finally on 11/23/2013, endoscopic ultrasonography under Annette Beltran showed a subtle 2.5 cm soft tissue mass in the mid-pancreas, abutting the splenic vein. This was biopsied x2, with cytology ([id number]) showing adenocarcinoma. A peripancreatic fluid collection measuring over 5 cm was aspirated yielding 25 cc of chocolate colored fluid. Two or three scattered round hypoechoic lymph nodes were noted near the pancreatic tail but were not sampled.  In addition, on 11/28/2013 a chest x-ray obtained to evaluate upper respiratory symptoms showed a small to moderate left effusion. Chest CT scan 11/29/2013 confirmed a moderate left-sided pleural effusion with no suspicious appearing pulmonary nodules or masses associated with it. The complex cystic lesion associated with the tail of the pancreas was again noted, measuring 5.5 cm despite the recent aspiration procedure.  The patient's subsequent history is as detailed below.  INTERVAL HISTORY: Annette Beltran returns today with her husband Annette Beltran for followup of Annette Beltran's pancreatic  cancer. Today is day 1 cycle 3 of 4  planned cycles of gemcitabine, given days 1, 8, and 15 of each 21 day cycle. Fawnda is tolerating her treatments generally well. Unfortunately there are problems with her port and this is going to have to be revised. Since her last visit here, she accompanied Annette Beltran to [facility], where he was evaluated at the sarcoma clinic. Do is planning to review the pathology. 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 Annette Beltran already has an appointment with Dr. Tammi Klippel in our radiation oncology service to discuss this further.  REVIEW OF SYSTEMS: Annette Beltran is feeling fatigued. She still able to walk most nights. Some days she just wants to lie down and not get up, but she fights that and then "get things done". She has mild insomnia. Sometimes she has aches and pains here in there, which are not very constant or more intense than before. She keeps a runny nose, a bit of a sore throat, and sometimes or mouth feels sore, though she doesn't have Frank mouth sores. A detailed review of systems today was otherwise stable  PAST MEDICAL HISTORY: Past Medical History  Diagnosis Date  . Macular degeneration   . Fluid retention   . Colitis, collagenous   . Vitamin D deficiency   . Osteopenia   . Celiac artery stenosis   . Splenic infarction   . Arthritis   . History of blood clots   . Anxiety   . Shingles   . Pancreatitis   . Osteoporosis   . Clotting disorder   . Breast cancer     rt lumpectomy  . Arrhythmia     "skips a beat"  Annette Beltran  heart  . GERD (gastroesophageal reflux disease)     PAST SURGICAL HISTORY: Past Surgical History  Procedure Laterality Date  . Foot surgery  2003    x 3, 2 on right, 1 on left  . Breast lumpectomy Right     radiation for 6 weeks  . Meniscus repair Right   . Eus N/A 11/23/2013    Procedure: UPPER ENDOSCOPIC ULTRASOUND (EUS) LINEAR;  Surgeon: Annette Banister, MD;   Location: [facility];  Service: Endoscopy;  Laterality: N/A;  . Tonsillectomy    . Laparoscopy N/A 12/20/2013    Procedure: LAPAROSCOPY DIAGNOSTIC ;  Surgeon: Annette Klein, MD;  Location: [facility];  Service: General;  Laterality: N/A;  . Cholecystectomy N/A 12/20/2013    Procedure: LAPAROSCOPIC CHOLECYSTECTOMY;  Surgeon: Annette Klein, MD;  Location: [facility];  Service: General;  Laterality: N/A;  . Splenectomy, total N/A 12/20/2013    Procedure: SPLENECTOMY;  Surgeon: Annette Klein, MD;  Location: [facility];  Service: General;  Laterality: N/A;  . Partial gastrectomy N/A 12/20/2013    Procedure: PARTIAL GASTRECTOMY;  Surgeon: Annette Klein, MD;  Location: [facility];  Service: General;  Laterality: N/A;  . Portacath placement N/A 01/15/2014    Procedure: INSERTION PORT-A-CATH;  Surgeon: Annette Klein, MD;  Location: [facility];  Service: General;  Laterality: N/A;    FAMILY HISTORY Family History  Problem Relation Age of Onset  . Colon cancer Neg Hx   . Aneurysm Mother   . Stroke Mother   . Prostate cancer Father 70  . Skin cancer Brother 6    treated with interferon for 1 year  . Heart failure Paternal Aunt   . Breast cancer Paternal Aunt     dx over 82s  . Pancreatic cancer Maternal Grandmother     dx in her 41s  .  Breast cancer Maternal Aunt     dx over 72  . Rheum arthritis Brother   . Breast cancer Paternal Aunt     dx over 32s  . Leukemia Paternal Aunt    the patient's father had a diagnosis of prostate cancer metastatic to the liver or a died at age 41. The patient's mother died secondary to carotid third brain aneurysm at the age of 25. The patient had 2 brothers, no sisters. One brother has a history of melanoma. The patient's mother is mother was diagnosed with pancreatic cancer in her 16s. There is a history of breast cancer and second degree relatives, none before the age of 27  GYNECOLOGIC HISTORY:  Menarche age 26, first live birth age 14, the patient is [place] P2.. Stopped in her early 37s.  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 Her husband of 58 years, Annette Beltran, retired from [employer] and currently works part-time as a Cabin crew with [employer]. Daughter Annette Beltran teaches first grade. Daughter Annette Beltran is a homemaker. The patient has 4 grandchildren Annette Beltran has an additional 5 grandchildren of his own). The patient attends a [employer]    ADVANCED DIRECTIVES: In place   HEALTH MAINTENANCE: History  Substance Use Topics  . Smoking status: Former Smoker -- 0.50 packs/day for 28 years    Types: Cigarettes    Quit date: 11/02/2000  . Smokeless tobacco: Never Used  . Alcohol Use: Yes     Comment: occ wine      Colonoscopy:  PAP:  Bone density:  Lipid panel:  Mammography:  Allergies  Allergen Reactions  . Codeine Nausea And Vomiting  . Lactose Intolerance (Gi)   . Tequin     Severe stomach pain  . Penicillins Nausea Only and Rash    Current Outpatient Prescriptions  Medication Sig Dispense Refill  . ALPRAZolam (XANAX) 0.5 MG tablet Take 1 tablet (0.5 mg total) by mouth 2 (two) times daily as needed for anxiety.  60 tablet  5  . Ascorbic Acid (VITAMIN C PO) Take 1 tablet by mouth 2 (two) times daily.      Marland Kitchen aspirin 81 MG tablet Take 81 mg by mouth daily.      Marland Kitchen azithromycin (ZITHROMAX Z-PAK) 250 MG tablet Take 2 tablets (500 mg) on  Day 1,  followed by 1 tablet (250 mg) once daily on Days 2 through 5.  6 each  0  . b complex vitamins tablet Take 1 tablet by mouth daily.      . diphenhydrAMINE (BENADRYL) 25 MG tablet Take 25 mg by mouth at bedtime.       . feeding supplement, RESOURCE BREEZE, (RESOURCE BREEZE) LIQD Take 1 Container by mouth 2 (two) times daily between meals.  30 Container  3  . furosemide (LASIX) 20 MG tablet Take 1 tablet (20 mg total) by mouth daily. Prn swelling  15 tablet  5  . lidocaine-prilocaine (EMLA) cream  Apply 1-2 hours to port a cath before procedure.  30 g  6  . lipase/protease/amylase (CREON-12/PANCREASE) 12000 UNITS CPEP capsule Take 2 capsules by mouth 3 (three) times daily with meals.  270 capsule  3  . loratadine (CLARITIN) 10 MG tablet Take 10 mg by mouth daily.      . ondansetron (ZOFRAN) 8 MG tablet Take 1 tablet (8 mg total) by mouth 2 (two)  times daily as needed (Nausea or vomiting).  30 tablet  1  . PARoxetine (PAXIL) 10 MG tablet Take 1 tablet (10 mg total) by mouth daily.  30 tablet  5  . prochlorperazine (COMPAZINE) 10 MG tablet Take 1 tablet (10 mg total) by mouth every 6 (six) hours as needed (Nausea or vomiting).  30 tablet  1  . valACYclovir (VALTREX) 1000 MG tablet Take 1 tablet (1,000 mg total) by mouth 2 (two) times daily.  90 tablet  1   No current facility-administered medications for this visit.    OBJECTIVE: Middle-aged white woman  in no acute distress  Filed Vitals:   05/28/14 1010  BP: 142/86  Pulse: 45  Temp: 98.2 F (36.8 C)  Resp: 18     Body mass index is 26.23 kg/(m^2).    ECOG FS:1 - Symptomatic but completely ambulatory  Ocular: Sclerae unicteric,  pupils round and equal ct  Ear-nose-throat: Oropharynx  clear, no thrush or other lesions  Lymphatic: No cervical or supraclavicular adenopathy Lungs no rales or rhonchi Heart regular rate and rhythm Abd  soft, nontender, positive bowel sounds, no masses palpated MSK no focal spinal tenderness,  joint swelling  Neuro: non-focal, well-oriented, positive affect Breasts: Deferred  LAB RESULTS:  CMP     Component Value Date/Time   NA 143 05/21/2014 0822   NA 143 12/26/2013 0620   K 3.9 05/21/2014 0822   K 3.7 12/26/2013 0620   CL 101 12/26/2013 0620   CO2 25 05/21/2014 0822   CO2 32 12/26/2013 0620   GLUCOSE 185* 05/21/2014 0822   GLUCOSE 92 12/26/2013 0620   BUN 11.1 05/21/2014 0822   BUN 9 12/26/2013 0620   CREATININE 0.7 05/21/2014 0822   CREATININE 0.57 12/26/2013 0620   CREATININE 0.62 11/28/2013  1635   CALCIUM 9.4 05/21/2014 0822   CALCIUM 8.7 12/26/2013 0620   PROT 6.8 05/21/2014 0822   PROT 5.0* 12/21/2013 0450   ALBUMIN 3.4* 05/21/2014 0822   ALBUMIN 2.3* 12/21/2013 0450   AST 48* 05/21/2014 0822   AST 42* 12/21/2013 0450   ALT 49 05/21/2014 0822   ALT 25 12/21/2013 0450   ALKPHOS 72 05/21/2014 0822   ALKPHOS 46 12/21/2013 0450   BILITOT 0.24 05/21/2014 0822   BILITOT 0.7 12/21/2013 0450   GFRNONAA >90 12/26/2013 0620   GFRAA >90 12/26/2013 0620    I No results found for this basename: SPEP,  UPEP,   kappa and lambda light chains    Lab Results  Component Value Date   WBC 1.7* 05/28/2014   NEUTROABS 1.0* 05/28/2014   HGB 10.5* 05/28/2014   HCT 29.4* 05/28/2014   MCV 98.0 05/28/2014   PLT 303 05/28/2014      Chemistry      Component Value Date/Time   NA 143 05/21/2014 0822   NA 143 12/26/2013 0620   K 3.9 05/21/2014 0822   K 3.7 12/26/2013 0620   CL 101 12/26/2013 0620   CO2 25 05/21/2014 0822   CO2 32 12/26/2013 0620   BUN 11.1 05/21/2014 0822   BUN 9 12/26/2013 0620   CREATININE 0.7 05/21/2014 0822   CREATININE 0.57 12/26/2013 0620   CREATININE 0.62 11/28/2013 1635      Component Value Date/Time   CALCIUM 9.4 05/21/2014 0822   CALCIUM 8.7 12/26/2013 0620   ALKPHOS 72 05/21/2014 0822   ALKPHOS 46 12/21/2013 0450   AST 48* 05/21/2014 0822   AST 42* 12/21/2013 0450   ALT 49 05/21/2014 3976  ALT 25 12/21/2013 0450   BILITOT 0.24 05/21/2014 0822   BILITOT 0.7 12/21/2013 0450     Results for ARPITA, FENTRESS (MRN [id number]) as of 05/21/2014 09:16  Ref. Range 02/01/2014 08:21 02/22/2014 13:48 03/15/2014 14:59 05/07/2014 08:13 05/14/2014 08:21  CA 19-9 Latest Range: <35.0 U/mL 25.1 23.8 26.8 30.5 28.8    Lab Results  Component Value Date   LABCA2 8 06/08/2011    No components found with this basename: LABCA125    No results found for this basename: INR,  in the last 168 hours  Urinalysis    Component Value Date/Time   COLORURINE YELLOW 06/07/2011 1942   APPEARANCEUR CLEAR 06/07/2011 1942    LABSPEC 1.010 02/15/2014 1109   LABSPEC 1.009 06/07/2011 1942   PHURINE 6.0 06/07/2011 1942   GLUCOSEU Negative 02/15/2014 [place] 06/07/2011 1942   HGBUR NEGATIVE 06/07/2011 1942   BILIRUBINUR neg 12/25/2011 1216   BILIRUBINUR NEGATIVE 06/07/2011 1942   KETONESUR NEGATIVE 06/07/2011 1942   PROTEINUR neg 12/25/2011 1216   PROTEINUR NEGATIVE 06/07/2011 1942   UROBILINOGEN 0.2 02/15/2014 1109   UROBILINOGEN negative 12/25/2011 1216   UROBILINOGEN 0.2 06/07/2011 1942   NITRITE neg 12/25/2011 1216   NITRITE NEGATIVE 06/07/2011 1942   LEUKOCYTESUR Negative 12/25/2011 1216    STUDIES: Ir Cv Line Injection  05/28/2014   CLINICAL DATA:  Pancreatic neoplasm, needs access for chemotherapy. Port catheter won't aspirate.  EXAM: PORT CATHETER INJECTION UNDER FLUOROSCOPY  TECHNIQUE: The procedure, risks (including but not limited to bleeding, infection, organ damage ), benefits, and alternatives were explained to the patient. Questions regarding the procedure were encouraged and answered. The patient understands and consents to the procedure.  Survey fluoroscopic inspection reveals that the subcutaneous portion of the catheter tubing is looped back in the subcutaneous tissues since initial placement, and the tip is now in the innominate vein. The port body projects lateral to the thoracic cage overlying the axilla.  A port access needle was placed by radiology RN, challenging due to the deep position of the port and inability to aspirate. Contrast injection initially demonstrated needle tip placement external to the port. The needle was repositioned. Injection demonstrates patency of the port reservoir and tubing. The innominate vein and SVC are widely patent. No extravasation.  IMPRESSION: 1. The port catheter tubing has pulled back, tip in the innominate vein. The reservoir is laterally placed and deep, difficult to access. The port was left accessed, and can continue to be used in this position. If long-term  access needs are anticipated, consider port revision and repositioning of both the tip of the port catheter and port body.   Electronically Signed   By: Arne Cleveland M.D.   On: 05/28/2014 09:16    ASSESSMENT: 68 y.o. 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 noted on abdominal MRI November 2014  PANCREATIC CANCER: (3) status post endoscopic ultrasonography of  the pancreas 11/23/2013 with cytology positive for adenocarcinoma, clinical stage T2 NX MX adenocarcinoma associated with a 5.5 cm pseudocyst (which was aspirated); baseline CA 19-9 was 47.0  (4) left pleural effusion status post thoracentesis 12/01/2013, cytology negative  (5) s/p laparoscopic cholecystectomy, open extended distal pancreatectomy, splenectomy, partial gastrectomy x2 12/20/2013 for a pT1 pN0, stage IA invasive ductal adenocarcinoma, grade 2, with positive resection margins  (6) adjuvant gemcitabine started 02/01/2014-- received 3 weekly doses before radiation, will reeive 9 additional doses starting 3-5 weeks after completion of radiation  (7) adjuvant radiation started 02/27/2014, with radiosensitization (capecitabine 1000 mg po BID on radiation days); completed 04/06/2014  (8) adjuvant gemcitabine, given  weekly , resumed 05/07/2014, with 9 doses planned  PLAN: Dorissa is tolerating the gemcitabine  well, but her counts are dropping. Very likely she will need a blood transfusion at some point. 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  Accordingly she will receive Neupogen for the next 3 days. I hope we are able to get her through next week's treatment after that she will have a week off.  In the meantime she will have her port revised. This is being scheduled through IR 4 06/01/2014.  Hang has a good understanding of the overall plan. She agrees with it. She knows the goal of treatment in her cases cure. She will call with any problems that may develop before her next visit here.   Chauncey Cruel, MD   05/28/2014 9:14 PM

## 2014-05-28 NOTE — Patient Instructions (Signed)
[facility] Discharge Instructions for Patients Receiving Chemotherapy  Today you received the following chemotherapy agents Gemzar To help prevent nausea and vomiting after your treatment, we encourage you to take your nausea medication as prescribed.   If you develop nausea and vomiting that is not controlled by your nausea medication, call the clinic.   BELOW ARE SYMPTOMS THAT SHOULD BE REPORTED IMMEDIATELY:  *FEVER GREATER THAN 100.5 F  *CHILLS WITH OR WITHOUT FEVER  NAUSEA AND VOMITING THAT IS NOT CONTROLLED WITH YOUR NAUSEA MEDICATION  *UNUSUAL SHORTNESS OF BREATH  *UNUSUAL BRUISING OR BLEEDING  TENDERNESS IN MOUTH AND THROAT WITH OR WITHOUT PRESENCE OF ULCERS  *URINARY PROBLEMS  *BOWEL PROBLEMS  UNUSUAL RASH Items with * indicate a potential emergency and should be followed up as soon as possible.  Feel free to call the clinic you have any questions or concerns. The clinic phone number is [phone].   Neutropenia Neutropenia is a condition that occurs when the level of a certain type of white blood cell (neutrophil) in your body becomes lower than normal. Neutrophils are made in the bone marrow and fight infections. These cells protect against bacteria and viruses. The fewer neutrophils you have, and the longer your body remains without them, the greater your risk of getting a severe infection becomes. CAUSES  The cause of neutropenia may be hard to determine. However, it is usually due to 3 main problems:   Decreased production of neutrophils. This may be due to:  Certain medicines such as chemotherapy.  Genetic problems.  Cancer.  Radiation treatments.  Vitamin deficiency.  Some pesticides.  Increased destruction of neutrophils. This may be due to:  Overwhelming infections.  Hemolytic anemia. This is when the body destroys its own blood cells.  Chemotherapy.  Neutrophils moving to areas of the body where they cannot fight  infections. This may be due to:  Dialysis procedures.  Conditions where the spleen becomes enlarged. Neutrophils are held in the spleen and are not available to the rest of the body.  Overwhelming infections. The neutrophils are held in the area of the infection and are not available to the rest of the body. SYMPTOMS  There are no specific symptoms of neutropenia. The lack of neutrophils can result in an infection, and an infection can cause various problems. DIAGNOSIS  Diagnosis is made by a blood test. A complete blood count is performed. The normal level of neutrophils in human blood differs with age and race. Infants have lower counts than older children and adults. African Americans have lower counts than Caucasians or Asians. The average adult level is 1500 cells/mm3 of blood. Neutrophil counts are interpreted as follows:  Greater than 1000 cells/mm3 gives normal protection against infection.  500 to 1000 cells/mm3 gives an increased risk for infection.  200 to 500 cells/mm3 is a greater risk for severe infection.  Lower than 200 cells/mm3 is a marked risk of infection. This may require hospitalization and treatment with antibiotic medicines. TREATMENT  Treatment depends on the underlying cause, severity, and presence of infections or symptoms. It also depends on your health. Your caregiver will discuss the treatment plan with you. Mild cases are often easily treated and have a good outcome. Preventative measures may also be started to limit your risk of infections. Treatment can include:  Taking antibiotics.  Stopping medicines that are known to cause neutropenia.  Correcting nutritional deficiencies by eating green vegetables to supply folic acid and taking vitamin B supplements.  Stopping exposure  to pesticides if your neutropenia is related to pesticide exposure.  Taking a blood growth factor called sargramostim, pegfilgrastim, or filgrastim if you are undergoing chemotherapy  for cancer. This stimulates white blood cell production.  Removal of the spleen if you have Felty's syndrome and have repeated infections. HOME CARE INSTRUCTIONS   Follow your caregiver's instructions about when you need to have blood work done.  Wash your hands often. Make sure others who come in contact with you also wash their hands.  Wash raw fruits and vegetables before eating them. They can carry bacteria and fungi.  Avoid people with colds or spreadable (contagious) diseases (chickenpox, herpes zoster, influenza).  Avoid large crowds.  Avoid construction areas. The dust can release fungus into the air.  Be cautious around children in daycare or school environments.  Take care of your respiratory system by coughing and deep breathing.  Bathe daily.  Protect your skin from cuts and burns.  Do not work in the garden or with flowers and plants.  Care for the mouth before and after meals by brushing with a soft toothbrush. If you have mucositis, do not use mouthwash. Mouthwash contains alcohol and can dry out the mouth even more.  Clean the area between the genitals and the anus (perineal area) after urination and bowel movements. Women need to wipe from front to back.  Use a water soluble lubricant during sexual intercourse and practice good hygiene after. Do not have intercourse if you are severely neutropenic. Check with your caregiver for guidelines.  Exercise daily as tolerated.  Avoid people who were vaccinated with a live vaccine in the past 30 days. You should not receive live vaccines (polio, typhoid).  Do not provide direct care for pets. Avoid animal droppings. Do not clean litter boxes and bird cages.  Do not share food utensils.  Do not use tampons, enemas, or rectal suppositories unless directed by your caregiver.  Use an electric razor to remove hair.  Wash your hands after handling magazines, letters, and newspapers. SEEK IMMEDIATE MEDICAL CARE IF:    You have a fever.  You have chills or start to shake.  You feel nauseous or vomit.  You develop mouth sores.  You develop aches and pains.  You have redness and swelling around open wounds.  Your skin is warm to the touch.  You have pus coming from your wounds.  You develop swollen lymph nodes.  You feel weak or fatigued.  You develop red streaks on the skin. MAKE SURE YOU:  Understand these instructions.  Will watch your condition.  Will get help right away if you are not doing well or get worse. Document Released: 04/10/2002 Document Revised: 01/11/2012 Document Reviewed: 05/08/2011 [facility] Patient Information 2015 [place], [place]. This information is not intended to replace advice given to you by your health care provider. Make sure you discuss any questions you have with your health care provider.

## 2014-05-29 ENCOUNTER — Ambulatory Visit (HOSPITAL_BASED_OUTPATIENT_CLINIC_OR_DEPARTMENT_OTHER): Payer: Medicare Other

## 2014-05-29 ENCOUNTER — Other Ambulatory Visit: Payer: Self-pay

## 2014-05-29 VITALS — BP 127/62 | HR 48 | Temp 97.9°F

## 2014-05-29 DIAGNOSIS — C50911 Malignant neoplasm of unspecified site of right female breast: Secondary | ICD-10-CM

## 2014-05-29 DIAGNOSIS — C252 Malignant neoplasm of tail of pancreas: Secondary | ICD-10-CM

## 2014-05-29 DIAGNOSIS — Z5189 Encounter for other specified aftercare: Secondary | ICD-10-CM

## 2014-05-29 MED ORDER — TBO-FILGRASTIM 300 MCG/0.5ML ~~LOC~~ SOSY
300.0000 ug | PREFILLED_SYRINGE | Freq: Once | SUBCUTANEOUS | Status: AC
  Administered 2014-05-29: 300 ug via SUBCUTANEOUS
  Filled 2014-05-29: qty 0.5

## 2014-05-29 NOTE — Addendum Note (Signed)
Addended by: Amelia Jo I on: 05/29/2014 11:48 AM   Modules accepted: Orders

## 2014-05-29 NOTE — Patient Instructions (Signed)

## 2014-05-30 ENCOUNTER — Other Ambulatory Visit: Payer: Self-pay | Admitting: Radiology

## 2014-05-30 ENCOUNTER — Ambulatory Visit (HOSPITAL_BASED_OUTPATIENT_CLINIC_OR_DEPARTMENT_OTHER): Payer: Medicare Other

## 2014-05-30 ENCOUNTER — Encounter (HOSPITAL_COMMUNITY): Payer: Self-pay | Admitting: Pharmacy Technician

## 2014-05-30 VITALS — BP 104/53 | HR 60 | Temp 98.2°F

## 2014-05-30 DIAGNOSIS — C252 Malignant neoplasm of tail of pancreas: Secondary | ICD-10-CM

## 2014-05-30 DIAGNOSIS — C50911 Malignant neoplasm of unspecified site of right female breast: Secondary | ICD-10-CM

## 2014-05-30 DIAGNOSIS — Z5189 Encounter for other specified aftercare: Secondary | ICD-10-CM

## 2014-05-30 MED ORDER — TBO-FILGRASTIM 300 MCG/0.5ML ~~LOC~~ SOSY
300.0000 ug | PREFILLED_SYRINGE | Freq: Once | SUBCUTANEOUS | Status: AC
  Administered 2014-05-30: 300 ug via SUBCUTANEOUS
  Filled 2014-05-30: qty 0.5

## 2014-05-31 ENCOUNTER — Ambulatory Visit (HOSPITAL_BASED_OUTPATIENT_CLINIC_OR_DEPARTMENT_OTHER): Payer: Medicare Other

## 2014-05-31 VITALS — BP 113/34 | HR 59 | Temp 98.0°F

## 2014-05-31 DIAGNOSIS — C252 Malignant neoplasm of tail of pancreas: Secondary | ICD-10-CM

## 2014-05-31 DIAGNOSIS — C50911 Malignant neoplasm of unspecified site of right female breast: Secondary | ICD-10-CM

## 2014-05-31 DIAGNOSIS — Z5189 Encounter for other specified aftercare: Secondary | ICD-10-CM

## 2014-05-31 MED ORDER — TBO-FILGRASTIM 300 MCG/0.5ML ~~LOC~~ SOSY
300.0000 ug | PREFILLED_SYRINGE | Freq: Once | SUBCUTANEOUS | Status: AC
  Administered 2014-05-31: 300 ug via SUBCUTANEOUS
  Filled 2014-05-31: qty 0.5

## 2014-06-01 ENCOUNTER — Other Ambulatory Visit: Payer: Self-pay | Admitting: Oncology

## 2014-06-01 ENCOUNTER — Encounter (HOSPITAL_COMMUNITY): Payer: Self-pay

## 2014-06-01 ENCOUNTER — Ambulatory Visit (HOSPITAL_COMMUNITY)
Admission: RE | Admit: 2014-06-01 | Discharge: 2014-06-01 | Disposition: A | Discharge status: Home / Self Care | Payer: Medicare Other | Source: Ambulatory Visit | Attending: Oncology | Admitting: Oncology

## 2014-06-01 DIAGNOSIS — Z9221 Personal history of antineoplastic chemotherapy: Secondary | ICD-10-CM | POA: Diagnosis not present

## 2014-06-01 DIAGNOSIS — C50911 Malignant neoplasm of unspecified site of right female breast: Secondary | ICD-10-CM

## 2014-06-01 DIAGNOSIS — E440 Moderate protein-calorie malnutrition: Secondary | ICD-10-CM

## 2014-06-01 DIAGNOSIS — I700 Atherosclerosis of aorta: Secondary | ICD-10-CM

## 2014-06-01 DIAGNOSIS — M810 Age-related osteoporosis without current pathological fracture: Secondary | ICD-10-CM | POA: Diagnosis not present

## 2014-06-01 DIAGNOSIS — D735 Infarction of spleen: Secondary | ICD-10-CM

## 2014-06-01 DIAGNOSIS — Z853 Personal history of malignant neoplasm of breast: Secondary | ICD-10-CM | POA: Diagnosis not present

## 2014-06-01 DIAGNOSIS — K219 Gastro-esophageal reflux disease without esophagitis: Secondary | ICD-10-CM | POA: Diagnosis not present

## 2014-06-01 DIAGNOSIS — Y849 Medical procedure, unspecified as the cause of abnormal reaction of the patient, or of later complication, without mention of misadventure at the time of the procedure: Secondary | ICD-10-CM | POA: Diagnosis not present

## 2014-06-01 DIAGNOSIS — T82598A Other mechanical complication of other cardiac and vascular devices and implants, initial encounter: Secondary | ICD-10-CM | POA: Insufficient documentation

## 2014-06-01 DIAGNOSIS — K52831 Collagenous colitis: Secondary | ICD-10-CM

## 2014-06-01 DIAGNOSIS — C251 Malignant neoplasm of body of pancreas: Secondary | ICD-10-CM

## 2014-06-01 DIAGNOSIS — H3530 Unspecified macular degeneration: Secondary | ICD-10-CM | POA: Diagnosis not present

## 2014-06-01 DIAGNOSIS — C259 Malignant neoplasm of pancreas, unspecified: Secondary | ICD-10-CM | POA: Diagnosis not present

## 2014-06-01 DIAGNOSIS — M8580 Other specified disorders of bone density and structure, unspecified site: Secondary | ICD-10-CM

## 2014-06-01 LAB — PROTIME-INR
INR: 0.99 (ref 0.00–1.49)
Prothrombin Time: 13.1 seconds (ref 11.6–15.2)

## 2014-06-01 LAB — CBC WITH DIFFERENTIAL/PLATELET
BASOS ABS: 0 10*3/uL (ref 0.0–0.1)
Basophils Relative: 0 % (ref 0–1)
EOS PCT: 1 % (ref 0–5)
Eosinophils Absolute: 0.3 10*3/uL (ref 0.0–0.7)
HCT: 30.6 % — ABNORMAL LOW (ref 36.0–46.0)
Hemoglobin: 11.1 g/dL — ABNORMAL LOW (ref 12.0–15.0)
Lymphocytes Relative: 22 % (ref 12–46)
Lymphs Abs: 6.1 10*3/uL — ABNORMAL HIGH (ref 0.7–4.0)
MCH: 35.8 pg — ABNORMAL HIGH (ref 26.0–34.0)
MCHC: 36.3 g/dL — ABNORMAL HIGH (ref 30.0–36.0)
MCV: 98.7 fL (ref 78.0–100.0)
Monocytes Absolute: 1.9 10*3/uL — ABNORMAL HIGH (ref 0.1–1.0)
Monocytes Relative: 7 % (ref 3–12)
NEUTROS PCT: 70 % (ref 43–77)
Neutro Abs: 19.4 10*3/uL — ABNORMAL HIGH (ref 1.7–7.7)
PLATELETS: 324 10*3/uL (ref 150–400)
RBC: 3.1 MIL/uL — AB (ref 3.87–5.11)
RDW: 15.2 % (ref 11.5–15.5)
WBC: 27.7 10*3/uL — ABNORMAL HIGH (ref 4.0–10.5)
nRBC: 2 /100 WBC — ABNORMAL HIGH

## 2014-06-01 LAB — BASIC METABOLIC PANEL
Anion gap: 12 (ref 5–15)
BUN: 13 mg/dL (ref 6–23)
CO2: 27 mEq/L (ref 19–32)
Calcium: 9.5 mg/dL (ref 8.4–10.5)
Chloride: 100 mEq/L (ref 96–112)
Creatinine, Ser: 0.59 mg/dL (ref 0.50–1.10)
GFR calc Af Amer: >90 mL/min (ref >90)
GLUCOSE: 101 mg/dL — AB (ref 70–99)
POTASSIUM: 4.6 meq/L (ref 3.7–5.3)
SODIUM: 139 meq/L (ref 137–147)

## 2014-06-01 MED ORDER — MIDAZOLAM HCL 2 MG/2ML IJ SOLN
INTRAMUSCULAR | Status: AC | PRN
  Administered 2014-06-01 (×3): 0.5 mg via INTRAVENOUS
  Administered 2014-06-01: 1 mg via INTRAVENOUS
  Administered 2014-06-01 (×3): 0.5 mg via INTRAVENOUS

## 2014-06-01 MED ORDER — VANCOMYCIN HCL IN DEXTROSE 1-5 GM/200ML-% IV SOLN
1000.0000 mg | Freq: Once | INTRAVENOUS | Status: AC
  Administered 2014-06-01: 1000 mg via INTRAVENOUS
  Filled 2014-06-01: qty 200

## 2014-06-01 MED ORDER — MIDAZOLAM HCL 2 MG/2ML IJ SOLN
INTRAMUSCULAR | Status: AC
  Filled 2014-06-01: qty 6

## 2014-06-01 MED ORDER — FENTANYL CITRATE 0.05 MG/ML IJ SOLN
INTRAMUSCULAR | Status: AC
  Filled 2014-06-01: qty 6

## 2014-06-01 MED ORDER — SODIUM CHLORIDE 0.9 % IV SOLN
INTRAVENOUS | Status: DC
  Administered 2014-06-01: 12:00:00 via INTRAVENOUS

## 2014-06-01 MED ORDER — HEPARIN SOD (PORK) LOCK FLUSH 100 UNIT/ML IV SOLN
INTRAVENOUS | Status: AC | PRN
  Administered 2014-06-01: 500 [IU]

## 2014-06-01 MED ORDER — LIDOCAINE HCL 1 % IJ SOLN
INTRAMUSCULAR | Status: AC
  Filled 2014-06-01: qty 20

## 2014-06-01 MED ORDER — LIDOCAINE-EPINEPHRINE (PF) 2 %-1:200000 IJ SOLN
INTRAMUSCULAR | Status: AC
  Filled 2014-06-01: qty 20

## 2014-06-01 MED ORDER — HEPARIN SOD (PORK) LOCK FLUSH 100 UNIT/ML IV SOLN
INTRAVENOUS | Status: AC
  Filled 2014-06-01: qty 5

## 2014-06-01 MED ORDER — FENTANYL CITRATE 0.05 MG/ML IJ SOLN
INTRAMUSCULAR | Status: AC | PRN
  Administered 2014-06-01 (×2): 50 ug via INTRAVENOUS

## 2014-06-01 NOTE — Progress Notes (Signed)
Recovery post port revision uneventful. Left hand still has raised area with slight bruising. Left inner forearm appears less red. It was suggested she continue with ice to left hand today and  and warm compresses to inner forearm over the weekend and to show to Dr Jana Hakim at her office visit on Monday. Pt verbalized understanding

## 2014-06-01 NOTE — H&P (Signed)
Annette Beltran is an 68 y.o. female.   Chief Complaint: "I'm getting my port fixed" [id number] with remote history of breast cancer and recently diagnosed pancreatic cancer . She had left subclavian port a cath placed by [facility] in 12/2013. Due to recent inability to aspirate from port pt underwent injection on 05/28/14 revealing retraction of tubing with deep and lateral placement of reservoir making it difficult to access. She presents today for port a cath revision.  Past Medical History  Diagnosis Date  . Macular degeneration   . Fluid retention   . Colitis, collagenous   . Vitamin D deficiency   . Osteopenia   . Celiac artery stenosis   . Splenic infarction   . Arthritis   . History of blood clots   . Anxiety   . Shingles   . Pancreatitis   . Osteoporosis   . Clotting disorder   . Breast cancer     rt lumpectomy  . Arrhythmia     "skips a beat"  [facility]  heart  . GERD (gastroesophageal reflux disease)     Past Surgical History  Procedure Laterality Date  . Foot surgery  2003    x 3, 2 on right, 1 on left  . Breast lumpectomy Right     radiation for 6 weeks  . Meniscus repair Right   . Eus N/A 11/23/2013    Procedure: UPPER ENDOSCOPIC ULTRASOUND (EUS) LINEAR;  Surgeon: Milus Banister, MD;  Location: [facility];  Service: Endoscopy;  Laterality: N/A;  . Tonsillectomy    . Laparoscopy N/A 12/20/2013    Procedure: LAPAROSCOPY DIAGNOSTIC ;  Surgeon: [name], MD;  Location: [facility];  Service: General;  Laterality: N/A;  . Cholecystectomy N/A 12/20/2013    Procedure: LAPAROSCOPIC CHOLECYSTECTOMY;  Surgeon: [name], MD;  Location: [facility];  Service: General;  Laterality: N/A;  . 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 Partial gastrectomy N/A 12/20/2013    Procedure: PARTIAL GASTRECTOMY;  Surgeon: [name], MD;  Location: [facility];  Service: General;  Laterality: N/A;   . Portacath placement N/A 01/15/2014    Procedure: INSERTION PORT-A-CATH;  Surgeon: [name], MD;  Location: [facility];  Service: General;  Laterality: N/A;    Family History  Problem Relation Age of Onset  . Colon cancer Neg Hx   . Aneurysm Mother   . Stroke Mother   . Prostate cancer Father 76  . Skin cancer Brother 35    treated with interferon for 1 year  . Heart failure Paternal Aunt   . Breast cancer Paternal Aunt     dx over 20s  . Pancreatic cancer Maternal Grandmother     dx in her 1s  . Breast cancer Maternal Aunt     dx over 5  . Rheum arthritis Brother   . Breast cancer Paternal Aunt     dx over 41s  . Leukemia Paternal Aunt    Social History:  reports that she quit smoking about 13 years ago. Her smoking use included Cigarettes. She has a 14 pack-year smoking history. She has never used smokeless tobacco. She reports that she drinks alcohol. She reports that she does not use illicit drugs.  Allergies:  Allergies  Allergen Reactions  . Codeine Nausea And Vomiting  . Lactose Intolerance (Gi)   . Tequin     Severe stomach pain  . Penicillins  Nausea Only and Rash    Current outpatient prescriptions:ALPRAZolam (XANAX) 0.5 MG tablet, Take 1 tablet (0.5 mg total) by mouth 2 (two) times daily as needed for anxiety., Disp: 60 tablet, Rfl: 5;  Ascorbic Acid (VITAMIN C PO), Take 1 tablet by mouth 2 (two) times daily., Disp: , Rfl: ;  aspirin EC 81 MG tablet, Take 81 mg by mouth daily., Disp: , Rfl: ;  b complex vitamins tablet, Take 1 tablet by mouth daily., Disp: , Rfl:  diphenhydrAMINE (BENADRYL) 25 MG tablet, Take 25 mg by mouth at bedtime. , Disp: , Rfl: ;  lipase/protease/amylase (CREON-12/PANCREASE) 12000 UNITS CPEP capsule, Take 2 capsules by mouth 3 (three) times daily with meals., Disp: 270 capsule, Rfl: 3;  loratadine (CLARITIN) 10 MG tablet, Take 10 mg by mouth daily., Disp: , Rfl: ;  Naproxen Sodium (ALEVE) 220 MG CAPS, Take 440 mg by mouth 2 (two) times daily  as needed (Pain)., Disp: , Rfl:  PARoxetine (PAXIL) 10 MG tablet, Take 10 mg by mouth every morning., Disp: , Rfl: ;  valACYclovir (VALTREX) 1000 MG tablet, Take 1 tablet (1,000 mg total) by mouth 2 (two) times daily., Disp: 90 tablet, Rfl: 1;  feeding supplement, RESOURCE BREEZE, (RESOURCE BREEZE) LIQD, Take 1 Container by mouth 2 (two) times daily between meals., Disp: 30 Container, Rfl: 3 furosemide (LASIX) 20 MG tablet, Take 20 mg by mouth daily as needed for fluid., Disp: , Rfl: ;  lidocaine-prilocaine (EMLA) cream, Apply 1-2 hours to port a cath before procedure., Disp: 30 g, Rfl: 6;  ondansetron (ZOFRAN) 8 MG tablet, Take 1 tablet (8 mg total) by mouth 2 (two) times daily as needed (Nausea or vomiting)., Disp: 30 tablet, Rfl: 1 prochlorperazine (COMPAZINE) 10 MG tablet, Take 1 tablet (10 mg total) by mouth every 6 (six) hours as needed (Nausea or vomiting)., Disp: 30 tablet, Rfl: 1 Current facility-administered medications:0.9 %  sodium chloride infusion, , Intravenous, Continuous, Hedy Jacob, PA-C, Last Rate: 50 mL/hr at 06/01/14 1202;  vancomycin (VANCOCIN) IVPB 1000 mg/200 mL premix, 1,000 mg, Intravenous, Once, Hedy Jacob, PA-C   Results for orders placed during the hospital encounter of 06/01/14 (from the past 48 hour(s))  CBC WITH DIFFERENTIAL     Status: Abnormal (Preliminary result)   Collection Time    06/01/14 11:55 AM      Result Value Ref Range   WBC PENDING  4.0 - 10.5 K/uL   RBC 3.10 (*) 3.87 - 5.11 MIL/uL   Hemoglobin 11.1 (*) 12.0 - 15.0 g/dL   HCT 30.6 (*) 36.0 - 46.0 %   MCV 98.7  78.0 - 100.0 fL   MCH 35.8 (*) 26.0 - 34.0 pg   MCHC 36.3 (*) 30.0 - 36.0 g/dL   RDW 15.2  11.5 - 15.5 %   Platelets 324  150 - 400 K/uL   Neutrophils Relative % PENDING  43 - 77 %   Neutro Abs PENDING  1.7 - 7.7 K/uL   Band Neutrophils PENDING  0 - 10 %   Lymphocytes Relative PENDING  12 - 46 %   Lymphs Abs PENDING  0.7 - 4.0 K/uL   Monocytes Relative PENDING  3 - 12 %    Monocytes Absolute PENDING  0.1 - 1.0 K/uL   Eosinophils Relative PENDING  0 - 5 %   Eosinophils Absolute PENDING  0.0 - 0.7 K/uL   Basophils Relative PENDING  0 - 1 %   Basophils Absolute PENDING  0.0 - 0.1 K/uL  WBC Morphology PENDING     RBC Morphology PENDING     Smear Review PENDING     nRBC PENDING  0 /100 WBC   Metamyelocytes Relative PENDING     Myelocytes PENDING     Promyelocytes Absolute PENDING     Blasts PENDING    PROTIME-INR     Status: None   Collection Time    06/01/14 11:55 AM      Result Value Ref Range   Prothrombin Time 13.1  11.6 - 15.2 seconds   INR 0.99  0.00 - 1.49   No results found.  Review of Systems  Constitutional: Negative for fever and chills.  HENT:       Occ HA's  Respiratory: Negative for hemoptysis and shortness of breath.        Occ cough  Cardiovascular:       Some diffuse CP after recent neupogen injection, currently resolved  Gastrointestinal: Negative for vomiting and blood in stool.       Intermittent nausea; mild abd soreness  Genitourinary: Negative for dysuria and hematuria.  Musculoskeletal: Negative for back pain.  Endo/Heme/Allergies: Does not bruise/bleed easily.    Blood pressure 107/69, pulse 48, temperature 98.2 F (36.8 C), temperature source Oral, resp. rate 16, SpO2 98.00%. Physical Exam  Constitutional: She is oriented to person, place, and time. She appears well-developed and well-nourished.  Cardiovascular:  Bradycardic with occ ectopy noted  Respiratory: Effort normal and breath sounds normal.  GI: Soft. Bowel sounds are normal.  Mild epigastric tenderness  Musculoskeletal: Normal range of motion. She exhibits no edema.  Neurological: She is alert and oriented to person, place, and time.     Assessment/Plan Patient with remote history of breast cancer and recently diagnosed pancreatic cancer . She had left subclavian port a cath placed by [facility] in 12/2013. Due to recent inability to aspirate from port pt  underwent injection on 05/28/14 revealing retraction of tubing with deep and lateral placement of reservoir making it difficult to access. She presents today for port a cath revision. Details/risks of procedure d/w pt/daughter with their understanding and consent.   Cashel Bellina,D KEVIN 06/01/2014, 12:34 PM

## 2014-06-01 NOTE — Procedures (Signed)
Successful LT IJ POWER PORT REVISION/REPLACEMENT NO COMP STABLE TIP SVC/RA READY FOR USE

## 2014-06-01 NOTE — Discharge Instructions (Signed)
Implanted Port Home Guide °An implanted port is a type of central line that is placed under the skin. Central lines are used to provide IV access when treatment or nutrition needs to be given through a person's veins. Implanted ports are used for long-term IV access. An implanted port may be placed because:  °· You need IV medicine that would be irritating to the small veins in your hands or arms.   °· You need long-term IV medicines, such as antibiotics.   °· You need IV nutrition for a long period.   °· You need frequent blood draws for lab tests.   °· You need dialysis.   °Implanted ports are usually placed in the chest area, but they can also be placed in the upper arm, the abdomen, or the leg. An implanted port has two main parts:  °· Reservoir. The reservoir is round and will appear as a small, raised area under your skin. The reservoir is the part where a needle is inserted to give medicines or draw blood.   °· Catheter. The catheter is a thin, flexible tube that extends from the reservoir. The catheter is placed into a large vein. Medicine that is inserted into the reservoir goes into the catheter and then into the vein.   °HOW WILL I CARE FOR MY INCISION SITE? °Do not get the incision site wet. Bathe or shower as directed by your health care provider.  °HOW IS MY PORT ACCESSED? °Special steps must be taken to access the port:  °· Before the port is accessed, a numbing cream can be placed on the skin. This helps numb the skin over the port site.   °· Your health care provider uses a sterile technique to access the port. °¨ Your health care provider must put on a mask and sterile gloves. °¨ The skin over your port is cleaned carefully with an antiseptic and allowed to dry. °¨ The port is gently pinched between sterile gloves, and a needle is inserted into the port. °· Only "non-coring" port needles should be used to access the port. Once the port is accessed, a blood return should be checked. This helps  ensure that the port is in the vein and is not clogged.   °· If your port needs to remain accessed for a constant infusion, a clear (transparent) bandage will be placed over the needle site. The bandage and needle will need to be changed every week, or as directed by your health care provider.   °· Keep the bandage covering the needle clean and dry. Do not get it wet. Follow your health care provider's instructions on how to take a shower or bath while the port is accessed.   °· If your port does not need to stay accessed, no bandage is needed over the port.   °WHAT IS FLUSHING? °Flushing helps keep the port from getting clogged. Follow your health care provider's instructions on how and when to flush the port. Ports are usually flushed with saline solution or a medicine called heparin. The need for flushing will depend on how the port is used.  °· If the port is used for intermittent medicines or blood draws, the port will need to be flushed:   °¨ After medicines have been given.   °¨ After blood has been drawn.   °¨ As part of routine maintenance.   °· If a constant infusion is running, the port may not need to be flushed.   °HOW LONG WILL MY PORT STAY IMPLANTED? °The port can stay in for as long as your health care   provider thinks it is needed. When it is time for the port to come out, surgery will be done to remove it. The procedure is similar to the one performed when the port was put in.  °WHEN SHOULD I SEEK IMMEDIATE MEDICAL CARE? °When you have an implanted port, you should seek immediate medical care if:  °· You notice a bad smell coming from the incision site.   °· You have swelling, redness, or drainage at the incision site.   °· You have more swelling or pain at the port site or the surrounding area.   °· You have a fever that is not controlled with medicine. °Document Released: 10/19/2005 Document Revised: 08/09/2013 Document Reviewed: 06/26/2013 °[employer]® Patient Information ©2015 [employer]. This  information is not intended to replace advice given to you by your health care provider. Make sure you discuss any questions you have with your health care provider. °Conscious Sedation °Sedation is the use of medicines to promote relaxation and relieve discomfort and anxiety. Conscious sedation is a type of sedation. Under conscious sedation you are less alert than normal but are still able to respond to instructions or stimulation. Conscious sedation is used during short medical and dental procedures. It is milder than deep sedation or general anesthesia and allows you to return to your regular activities sooner.  °LET YOUR HEALTH CARE PROVIDER KNOW ABOUT:  °· Any allergies you have. °· All medicines you are taking, including vitamins, herbs, eye drops, creams, and over-the-counter medicines. °· Use of steroids (by mouth or creams). °· Previous problems you or members of your family have had with the use of anesthetics. °· Any blood disorders you have. °· Previous surgeries you have had. °· Medical conditions you have. °· Possibility of pregnancy, if this applies. °· Use of cigarettes, alcohol, or illegal drugs. °RISKS AND COMPLICATIONS °Generally, this is a safe procedure. However, as with any procedure, problems can occur. Possible problems include: °· Oversedation. °· Trouble breathing on your own. You may need to have a breathing tube until you are awake and breathing on your own. °· Allergic reaction to any of the medicines used for the procedure. °BEFORE THE PROCEDURE °· You may have blood tests done. These tests can help show how well your kidneys and liver are working. They can also show how well your blood clots. °· A physical exam will be done.   °· Only take medicines as directed by your health care provider. You may need to stop taking medicines (such as blood thinners, aspirin, or nonsteroidal anti-inflammatory drugs) before the procedure.   °· Do not eat or drink at least 6 hours before the procedure  or as directed by your health care provider. °· Arrange for a responsible adult, family member, or friend to take you home after the procedure. He or she should stay with you for at least 24 hours after the procedure, until the medicine has worn off. °PROCEDURE  °· An intravenous (IV) catheter will be inserted into one of your veins. Medicine will be able to flow directly into your body through this catheter. You may be given medicine through this tube to help prevent pain and help you relax. °· The medical or dental procedure will be done. °AFTER THE PROCEDURE °· You will stay in a recovery area until the medicine has worn off. Your blood pressure and pulse will be checked.   °·  Depending on the procedure you had, you may be allowed to go home when you can tolerate liquids and your pain is under   control. °Document Released: 07/14/2001 Document Revised: 10/24/2013 Document Reviewed: 06/26/2013 °[employer]® Patient Information ©2015 [employer]. This information is not intended to replace advice given to you by your health care provider. Make sure you discuss any questions you have with your health care provider. ° °

## 2014-06-01 NOTE — Progress Notes (Signed)
Pt arrives today and states she has some phlebitis in left ant forearm from chemo last week. Area is pink 8cmx4cm warm compresses applied with pt stating some relief 1st attempt IV attempt  was left hand. No blood obtained and cath was removed and pressure dressing applied with gauze and paper tape.2nd attempt left antcubital space with success. 1 hour later patted called to room and stated she had a knot on her hand. Left hand had 3cmx3cm swollen area. Paper tape dressing gauze removed and ice pack applied to hand

## 2014-06-03 ENCOUNTER — Encounter: Payer: Self-pay | Admitting: Radiation Oncology

## 2014-06-03 NOTE — Progress Notes (Signed)
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  She is without complaint.                              ALLERGIES:  is allergic to codeine; lactose intolerance (gi); tequin; and penicillins.  Meds: Current Outpatient Prescriptions  Medication Sig Dispense Refill  . ALPRAZolam (XANAX) 0.5 MG tablet Take 1 tablet (0.5 mg total) by mouth 2 (two) times daily as needed for anxiety.  60 tablet  5  . Ascorbic Acid (VITAMIN C PO) Take 1 tablet by mouth 2 (two) times daily.      Marland Kitchen aspirin EC 81 MG tablet Take 81 mg by mouth daily.      Marland Kitchen b complex vitamins tablet Take 1 tablet by mouth daily.      . diphenhydrAMINE (BENADRYL) 25 MG tablet Take 25 mg by mouth at bedtime.       . feeding supplement, RESOURCE BREEZE, (RESOURCE BREEZE) LIQD Take 1 Container by mouth 2 (two) times daily between meals.  30 Container  3  . furosemide (LASIX) 20 MG tablet Take 20 mg by mouth daily as needed for fluid.      Marland Kitchen lidocaine-prilocaine (EMLA) cream Apply 1-2 hours to port a cath before procedure.  30 g  6  . lipase/protease/amylase (CREON-12/PANCREASE) 12000 UNITS CPEP capsule Take 2 capsules by mouth 3 (three) times daily with meals.  270 capsule  3  . loratadine (CLARITIN) 10 MG tablet Take 10 mg by mouth daily.      . Naproxen Sodium (ALEVE) 220 MG CAPS Take 440 mg by mouth 2 (two) times daily as needed (Pain).      . ondansetron (ZOFRAN) 8 MG tablet Take 1 tablet (8 mg total) by mouth 2 (two) times daily as  needed (Nausea or vomiting).  30 tablet  1  . PARoxetine (PAXIL) 10 MG tablet Take 10 mg by mouth every morning.      . prochlorperazine (COMPAZINE) 10 MG tablet Take 1 tablet (10 mg total) by mouth every 6 (six) hours as needed (Nausea or vomiting).  30 tablet  1  . valACYclovir (VALTREX) 1000 MG tablet Take 1 tablet (1,000 mg total) by mouth 2 (two) times daily.  90 tablet  1   No current facility-administered medications for this encounter.    Physical Findings: The patient is in no acute distress. Patient is alert and oriented.  vitals were not taken for this visit..  No significant changes.  Lab Findings: Lab Results  Component Value Date   WBC 27.7* 06/01/2014   HGB 11.1* 06/01/2014   HCT 30.6* 06/01/2014   MCV 98.7 06/01/2014   PLT 324 06/01/2014    @LASTCHEM @  Radiographic Findings: Ir Fluoro Guide Cv Line Right  06/01/2014   CLINICAL DATA:  Pancreas cancer, prior history of breast cancer, access for chemotherapy, malfunctioning left subclavian power port  EXAM: ULTRASOUND GUIDANCE FOR VASCULAR ACCESS  REMOVAL  OF THE LEFT SUBCLAVIAN PORT CATHETER  ULTRASOUND AND FLUOROSCOPIC INSERTION OF A NEW LEFT IJ POWER PORT CATHETER PRESERVING THE EXISTING PORT CATHETER SUBCUTANEOUS POCKET.  Date:  7/31/20157/31/2015 2:46 pm  Radiologist:  M. Daryll Brod, MD  Guidance:  Ultrasound and fluoroscopic  FLUOROSCOPY TIME:  1 min 12 seconds  MEDICATIONS AND MEDICAL HISTORY: 1 g vancomycinadministered within 1 hour of the procedure.4 mg Versed said, 100 mcg fentanyl  ANESTHESIA/SEDATION: 45 min  CONTRAST:  None.  COMPLICATIONS: No immediate  PROCEDURE: Informed consent was obtained from the patient following explanation of the procedure, risks, benefits and alternatives. The patient understands, agrees and consents for the procedure. All questions were addressed. A time out was performed.  Maximal barrier sterile technique utilized including caps, mask, sterile gowns, sterile gloves, large sterile drape,  hand hygiene, and 2% chlorhexidine scrub.  Left subclavian port catheter removal: Initially under sterile conditions and local anesthesia, an incision was made along the existing left subclavian port catheter site. Utilizing sharp and blunt dissection, the left subclavian port catheter was removed entirely. Port catheter pocket was flushed with saline. There was adequate hemostasis. No signs of infection. Port catheter pocket will be preserved for a new left IJ power port.  Under sterile conditions and local anesthesia, left internal jugular micropuncture venous access was performed. Access was performed with ultrasound. Images were obtained for documentation. A guide wire was inserted followed by a transitional dilator. This allowed insertion of a guide wire and catheter into the IVC. Measurements were obtained from the SVC / RA junction back to the left IJ venotomy site. In the left infraclavicular chest, the existing subcutaneous pocket was preserved. The port catheter was assembled and checked for leakage. The port catheter was secured in the pocket with two retention sutures. The tubing was tunneled subcutaneously to the left IJ venotomy site and inserted into the SVC/RA junction through a valved peel-away sheath. Position was confirmed with fluoroscopy. Images were obtained for documentation. The patient tolerated the procedure well. No immediate complications. Incisions were closed in a two layer fashion with 4 - 0 Vicryl suture. Dermabond was applied to the skin. The port catheter was accessed, blood was aspirated followed by saline and heparin flushes. Needle was removed. A dry sterile dressing was applied.  IMPRESSION: Successful removal of the malfunctioning left subclavian power port catheter.  Successful Ultrasound and fluoroscopically guided left internal jugular single lumen power port catheter insertion, utilizing the existing subcutaneous pocket. Tip in the SVC/RA junction. Catheter ready for use.    Electronically Signed   By: Daryll Brod M.D.   On: 06/01/2014 15:12   Ir Removal Tun Access W/ Port W/o Fl Mod Sed  06/01/2014   CLINICAL DATA:  Pancreas cancer, prior history of breast cancer, access for chemotherapy, malfunctioning left subclavian power port  EXAM: ULTRASOUND GUIDANCE FOR VASCULAR ACCESS  REMOVAL OF THE LEFT SUBCLAVIAN PORT CATHETER  ULTRASOUND AND FLUOROSCOPIC INSERTION OF A NEW LEFT IJ POWER PORT CATHETER PRESERVING THE EXISTING PORT CATHETER SUBCUTANEOUS POCKET.  Date:  7/31/20157/31/2015 2:46 pm  Radiologist:  M. Daryll Brod, MD  Guidance:  Ultrasound and fluoroscopic  FLUOROSCOPY TIME:  1 min 12 seconds  MEDICATIONS AND MEDICAL HISTORY: 1 g vancomycinadministered within 1 hour of the procedure.4 mg Versed said, 100 mcg fentanyl  ANESTHESIA/SEDATION: 45 min  CONTRAST:  None.  COMPLICATIONS: No immediate  PROCEDURE: Informed consent was obtained from the patient following explanation of the procedure, risks, benefits and alternatives. The patient understands, agrees and consents for  the procedure. All questions were addressed. A time out was performed.  Maximal barrier sterile technique utilized including caps, mask, sterile gowns, sterile gloves, large sterile drape, hand hygiene, and 2% chlorhexidine scrub.  Left subclavian port catheter removal: Initially under sterile conditions and local anesthesia, an incision was made along the existing left subclavian port catheter site. Utilizing sharp and blunt dissection, the left subclavian port catheter was removed entirely. Port catheter pocket was flushed with saline. There was adequate hemostasis. No signs of infection. Port catheter pocket will be preserved for a new left IJ power port.  Under sterile conditions and local anesthesia, left internal jugular micropuncture venous access was performed. Access was performed with ultrasound. Images were obtained for documentation. A guide wire was inserted followed by a transitional dilator.  This allowed insertion of a guide wire and catheter into the IVC. Measurements were obtained from the SVC / RA junction back to the left IJ venotomy site. In the left infraclavicular chest, the existing subcutaneous pocket was preserved. The port catheter was assembled and checked for leakage. The port catheter was secured in the pocket with two retention sutures. The tubing was tunneled subcutaneously to the left IJ venotomy site and inserted into the SVC/RA junction through a valved peel-away sheath. Position was confirmed with fluoroscopy. Images were obtained for documentation. The patient tolerated the procedure well. No immediate complications. Incisions were closed in a two layer fashion with 4 - 0 Vicryl suture. Dermabond was applied to the skin. The port catheter was accessed, blood was aspirated followed by saline and heparin flushes. Needle was removed. A dry sterile dressing was applied.  IMPRESSION: Successful removal of the malfunctioning left subclavian power port catheter.  Successful Ultrasound and fluoroscopically guided left internal jugular single lumen power port catheter insertion, utilizing the existing subcutaneous pocket. Tip in the SVC/RA junction. Catheter ready for use.   Electronically Signed   By: Daryll Brod M.D.   On: 06/01/2014 15:12   Ir Cv Line Injection  05/28/2014   CLINICAL DATA:  Pancreatic neoplasm, needs access for chemotherapy. Port catheter won't aspirate.  EXAM: PORT CATHETER INJECTION UNDER FLUOROSCOPY  TECHNIQUE: The procedure, risks (including but not limited to bleeding, infection, organ damage ), benefits, and alternatives were explained to the patient. Questions regarding the procedure were encouraged and answered. The patient understands and consents to the procedure.  Survey fluoroscopic inspection reveals that the subcutaneous portion of the catheter tubing is looped back in the subcutaneous tissues since initial placement, and the tip is now in the  innominate vein. The port body projects lateral to the thoracic cage overlying the axilla.  A port access needle was placed by radiology RN, challenging due to the deep position of the port and inability to aspirate. Contrast injection initially demonstrated needle tip placement external to the port. The needle was repositioned. Injection demonstrates patency of the port reservoir and tubing. The innominate vein and SVC are widely patent. No extravasation.  IMPRESSION: 1. The port catheter tubing has pulled back, tip in the innominate vein. The reservoir is laterally placed and deep, difficult to access. The port was left accessed, and can continue to be used in this position. If long-term access needs are anticipated, consider port revision and repositioning of both the tip of the port catheter and port body.   Electronically Signed   By: Arne Cleveland M.D.   On: 05/28/2014 09:16   Ir US Guide Vasc Access Right  06/01/2014   CLINICAL DATA:  Pancreas  cancer, prior history of breast cancer, access for chemotherapy, malfunctioning left subclavian power port  EXAM: ULTRASOUND GUIDANCE FOR VASCULAR ACCESS  REMOVAL OF THE LEFT SUBCLAVIAN PORT CATHETER  ULTRASOUND AND FLUOROSCOPIC INSERTION OF A NEW LEFT IJ POWER PORT CATHETER PRESERVING THE EXISTING PORT CATHETER SUBCUTANEOUS POCKET.  Date:  7/31/20157/31/2015 2:46 pm  Radiologist:  M. Daryll Brod, MD  Guidance:  Ultrasound and fluoroscopic  FLUOROSCOPY TIME:  1 min 12 seconds  MEDICATIONS AND MEDICAL HISTORY: 1 g vancomycinadministered within 1 hour of the procedure.4 mg Versed said, 100 mcg fentanyl  ANESTHESIA/SEDATION: 45 min  CONTRAST:  None.  COMPLICATIONS: No immediate  PROCEDURE: Informed consent was obtained from the patient following explanation of the procedure, risks, benefits and alternatives. The patient understands, agrees and consents for the procedure. All questions were addressed. A time out was performed.  Maximal barrier sterile technique  utilized including caps, mask, sterile gowns, sterile gloves, large sterile drape, hand hygiene, and 2% chlorhexidine scrub.  Left subclavian port catheter removal: Initially under sterile conditions and local anesthesia, an incision was made along the existing left subclavian port catheter site. Utilizing sharp and blunt dissection, the left subclavian port catheter was removed entirely. Port catheter pocket was flushed with saline. There was adequate hemostasis. No signs of infection. Port catheter pocket will be preserved for a new left IJ power port.  Under sterile conditions and local anesthesia, left internal jugular micropuncture venous access was performed. Access was performed with ultrasound. Images were obtained for documentation. A guide wire was inserted followed by a transitional dilator. This allowed insertion of a guide wire and catheter into the IVC. Measurements were obtained from the SVC / RA junction back to the left IJ venotomy site. In the left infraclavicular chest, the existing subcutaneous pocket was preserved. The port catheter was assembled and checked for leakage. The port catheter was secured in the pocket with two retention sutures. The tubing was tunneled subcutaneously to the left IJ venotomy site and inserted into the SVC/RA junction through a valved peel-away sheath. Position was confirmed with fluoroscopy. Images were obtained for documentation. The patient tolerated the procedure well. No immediate complications. Incisions were closed in a two layer fashion with 4 - 0 Vicryl suture. Dermabond was applied to the skin. The port catheter was accessed, blood was aspirated followed by saline and heparin flushes. Needle was removed. A dry sterile dressing was applied.  IMPRESSION: Successful removal of the malfunctioning left subclavian power port catheter.  Successful Ultrasound and fluoroscopically guided left internal jugular single lumen power port catheter insertion, utilizing the  existing subcutaneous pocket. Tip in the SVC/RA junction. Catheter ready for use.   Electronically Signed   By: Daryll Brod M.D.   On: 06/01/2014 15:12    Impression:  The patient is recovering from the effects of radiation.    Plan:  Follow-up with Dr. Jana Hakim and in rad-onc as needed.  _____________________________________  Sheral Apley Tammi Klippel, M.D.

## 2014-06-04 ENCOUNTER — Ambulatory Visit (HOSPITAL_BASED_OUTPATIENT_CLINIC_OR_DEPARTMENT_OTHER): Payer: Medicare Other | Admitting: Oncology

## 2014-06-04 ENCOUNTER — Ambulatory Visit
Admission: RE | Admit: 2014-06-04 | Discharge: 2014-06-04 | Disposition: A | Discharge status: Home / Self Care | Payer: Medicare Other | Source: Ambulatory Visit | Attending: Radiation Oncology | Admitting: Radiation Oncology

## 2014-06-04 ENCOUNTER — Telehealth: Payer: Self-pay | Admitting: Oncology

## 2014-06-04 ENCOUNTER — Ambulatory Visit (HOSPITAL_BASED_OUTPATIENT_CLINIC_OR_DEPARTMENT_OTHER): Payer: Medicare Other

## 2014-06-04 ENCOUNTER — Telehealth: Payer: Self-pay | Admitting: *Deleted

## 2014-06-04 ENCOUNTER — Encounter: Payer: Self-pay | Admitting: Radiation Oncology

## 2014-06-04 ENCOUNTER — Other Ambulatory Visit (HOSPITAL_BASED_OUTPATIENT_CLINIC_OR_DEPARTMENT_OTHER): Payer: Medicare Other

## 2014-06-04 VITALS — BP 128/75 | HR 69 | Temp 97.9°F | Resp 18 | Ht 64.0 in | Wt 155.4 lb

## 2014-06-04 VITALS — BP 119/74 | HR 57 | Resp 16 | Wt 155.0 lb

## 2014-06-04 DIAGNOSIS — D7389 Other diseases of spleen: Secondary | ICD-10-CM

## 2014-06-04 DIAGNOSIS — I700 Atherosclerosis of aorta: Secondary | ICD-10-CM

## 2014-06-04 DIAGNOSIS — Z5111 Encounter for antineoplastic chemotherapy: Secondary | ICD-10-CM

## 2014-06-04 DIAGNOSIS — C251 Malignant neoplasm of body of pancreas: Secondary | ICD-10-CM

## 2014-06-04 DIAGNOSIS — C252 Malignant neoplasm of tail of pancreas: Secondary | ICD-10-CM

## 2014-06-04 DIAGNOSIS — E440 Moderate protein-calorie malnutrition: Secondary | ICD-10-CM

## 2014-06-04 DIAGNOSIS — Z853 Personal history of malignant neoplasm of breast: Secondary | ICD-10-CM

## 2014-06-04 DIAGNOSIS — M8580 Other specified disorders of bone density and structure, unspecified site: Secondary | ICD-10-CM

## 2014-06-04 DIAGNOSIS — K52831 Collagenous colitis: Secondary | ICD-10-CM

## 2014-06-04 DIAGNOSIS — D735 Infarction of spleen: Secondary | ICD-10-CM

## 2014-06-04 DIAGNOSIS — C50911 Malignant neoplasm of unspecified site of right female breast: Secondary | ICD-10-CM

## 2014-06-04 LAB — MANUAL DIFFERENTIAL
ALC: 2 10*3/uL (ref 0.9–3.3)
ANC (CHCC manual diff): 7.5 10*3/uL — ABNORMAL HIGH (ref 1.5–6.5)
BASOPHIL: 0 % (ref 0–2)
Band Neutrophils: 2 % (ref 0–10)
Blasts: 0 % (ref 0–0)
EOS%: 2 % (ref 0–7)
LYMPH: 15 % (ref 14–49)
MONO: 26 % — ABNORMAL HIGH (ref 0–14)
MYELOCYTES: 6 % — AB (ref 0–0)
Metamyelocytes: 2 % — ABNORMAL HIGH (ref 0–0)
NRBC: 15 % — AB (ref 0–0)
Other Cell: 0 % (ref 0–0)
PLT EST: ADEQUATE
PROMYELO: 0 % (ref 0–0)
SEG: 47 % (ref 38–77)
Variant Lymph: 0 % (ref 0–0)

## 2014-06-04 LAB — CBC WITH DIFFERENTIAL/PLATELET
HCT: 33.7 % — ABNORMAL LOW (ref 34.8–46.6)
HGB: 11.6 g/dL (ref 11.6–15.9)
MCH: 35.6 pg — AB (ref 25.1–34.0)
MCHC: 34.2 g/dL (ref 31.5–36.0)
MCV: 104.1 fL — ABNORMAL HIGH (ref 79.5–101.0)
Platelets: 213 10*3/uL (ref 145–400)
RBC: 3.24 10*6/uL — ABNORMAL LOW (ref 3.70–5.45)
RDW: 14.8 % — AB (ref 11.2–14.5)
WBC: 13.1 10*3/uL — ABNORMAL HIGH (ref 3.9–10.3)

## 2014-06-04 LAB — CANCER ANTIGEN 19-9: CA 19 9: 25.1 U/mL (ref <35.0)

## 2014-06-04 MED ORDER — SODIUM CHLORIDE 0.9 % IV SOLN
1000.0000 mg/m2 | Freq: Once | INTRAVENOUS | Status: AC
  Administered 2014-06-04: 1748 mg via INTRAVENOUS
  Filled 2014-06-04: qty 45.97

## 2014-06-04 MED ORDER — SODIUM CHLORIDE 0.9 % IV SOLN
Freq: Once | INTRAVENOUS | Status: AC
  Administered 2014-06-04: 10:00:00 via INTRAVENOUS

## 2014-06-04 MED ORDER — PROCHLORPERAZINE MALEATE 10 MG PO TABS
10.0000 mg | ORAL_TABLET | Freq: Once | ORAL | Status: AC
  Administered 2014-06-04: 10 mg via ORAL

## 2014-06-04 MED ORDER — SODIUM CHLORIDE 0.9 % IJ SOLN
10.0000 mL | INTRAMUSCULAR | Status: DC | PRN
  Administered 2014-06-04: 10 mL
  Filled 2014-06-04: qty 10

## 2014-06-04 MED ORDER — HEPARIN SOD (PORK) LOCK FLUSH 100 UNIT/ML IV SOLN
500.0000 [IU] | Freq: Once | INTRAVENOUS | Status: AC | PRN
  Administered 2014-06-04: 500 [IU]
  Filled 2014-06-04: qty 5

## 2014-06-04 MED ORDER — PROCHLORPERAZINE MALEATE 10 MG PO TABS
ORAL_TABLET | ORAL | Status: AC
  Filled 2014-06-04: qty 1

## 2014-06-04 NOTE — Telephone Encounter (Signed)
, °

## 2014-06-04 NOTE — Progress Notes (Signed)
[place]  Telephone:([phone]) [phone] [phone]) [phone]     ID: Annette Beltran OB: [birth year]  MR#: [id number]  XID#:[id number]  PCP: [name], MD GYN:   SU: [name] OTHER MD: Delfin Edis, [name]  CHIEF COMPLAINT: Early stage pancreatic cancer resected with positive margins  CURRENT TREATMENT: Adjuvant gemcitabine   PANCREATIC CANCER HISTORY: I formerly followed Annette Beltran for a stage I breast cancer, which was treated with surgery, radiation, and anti-estrogens. She was released from followup here in 2007. In addition, in 2012 she had abdominal pain leading to hospitalization. 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 The patient also has a history of collagenous colitis, which of course carries its own set of symptoms, making identification of her new attic problem more difficult.  Annette Beltran tells me she started "feeling bad" in September of 2014. There was abdominal discomfort localizing to the left upper o'clock on, worse with inspiration. CT angiography 08/21/2013 showed no clot and no significant abnormalities. Plain rib and left shoulder views were negative. CT abdomen and pelvis with contrast 08/22/2013 showed slight prominence of the pancreatic duct in the distal body and tail with a new small structure emanating from the tail of the pancreas consistent with a pseudocyst. Exudate near the tail of the pancreas suggested pancreatitis. MRI of the abdomen 09/03/2013 confirmed several ill-defined fluid collections between the gastric fundus and the splenic hilum surrounding the pancreatic tail. There was no evidence of a pancreatic mass. The pancreatic tail did enhanced following contrast. The splenic vein appeared chronically thrombosed with a small amount of  nonocclusive thrombus extending into the main and left portal veins.  On 09/26/2013 amylase was 237 and lipase 513.0, consistent with acute pancreatitis. These numbers subsequently dropped in on 11/28/2013 amylase was normal at 47 and lipase was mildly elevated at 110  With continuing left-sided abdominal discomfort, abdominal ultrasound 11/13/2013 showed multiple gallstones with no evidence of cholecystitis. Evaluation of the pancreas showed a hypoechoic lobulated focus measuring up to 7.4 cm felt to be nonspecific. Repeat abdominal CT 11/14/2013 showed diffuse pancreatic atrophy without evidence of a mass, again consistent with chronic pancreatitis. There was mild soft tissue stranding suspicious for mild superimposed acute pancreatitis. A new rim-enhancing fluid collection was noted in the gastrosplenic ligament measuring 4.5 cm, consistent with a pseudocyst. Finally on 11/23/2013, endoscopic ultrasonography under Oretha Caprice showed a subtle 2.5 cm soft tissue mass in the mid-pancreas, abutting the splenic vein. This was biopsied x2, with cytology ([id number]) showing adenocarcinoma. A peripancreatic fluid collection measuring over 5 cm was aspirated yielding 25 cc of chocolate colored fluid. Two or three scattered round hypoechoic lymph nodes were noted near the pancreatic tail but were not sampled.  In addition, on 11/28/2013 a chest x-ray obtained to evaluate upper respiratory symptoms showed a small to moderate left effusion. Chest CT scan 11/29/2013 confirmed a moderate left-sided pleural effusion with no suspicious appearing pulmonary nodules or masses associated with it. The complex cystic lesion associated with the tail of the pancreas was again noted, measuring 5.5 cm despite the recent aspiration procedure.  The patient's subsequent history is as detailed below.  INTERVAL HISTORY: Annette Beltran returns today with her husband [name] and their daughter for followup of Annette Beltran's pancreatic cancer. Today is  day 8 cycle 3 of 4 planned cycles of  gemcitabine given days 1, 8, and 15 of each 21 day cycle. Since her last visit here Annette Beltran had her port replaced. That went well except that she had some skin issues from the tape and Mediport is a little bit medial so "it shows" when she wears a V-neck top. A second issue is aches and pains that she developed from the Neupogen she received days 23 and 4 after her last dose of Gemzar. This lasted several days and was very uncomfortable.  We also updated Larry's situation. He is seeing Dr. Tammi Klippel today and is getting simulated. I encouraged him to proceed with radiation. He also had a CT of the chest which shows no evidence of metastatic sarcoma. It does show a 4.5 cm ascending aortic aneurysm, which we will follow. There is a ground glass opacity noted as well, which is not going to be related to the sarcoma. This also requires followup.  REVIEW OF SYSTEMS: Annette Beltran is doing remarkably well as far as her treatment is concerned. She has developed some constipation, and given her history of colitis I we have to treat this very carefully. I don't think it would prefer to take some stool softeners. She's gained "a few pounds". 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 We discussed exercises more than died at as a solution. She has not lost any more here. The pain from the Neulasta has subsided. Aside from this a detailed review of systems today was noncontributory  PAST MEDICAL HISTORY: Past Medical History  Diagnosis Date  . Macular degeneration   . Fluid retention   . Colitis, collagenous   . Vitamin D deficiency   . Osteopenia   . Celiac artery stenosis   . Splenic infarction   . Arthritis   . History of blood clots   . Anxiety   . Shingles   . Pancreatitis   . Osteoporosis   . Clotting disorder   . Breast cancer     rt lumpectomy  . Arrhythmia     "skips a beat"  [place]  heart  . GERD (gastroesophageal reflux disease)      PAST SURGICAL HISTORY: Past Surgical History  Procedure Laterality Date  . Foot surgery  2003    x 3, 2 on right, 1 on left  . Breast lumpectomy Right     radiation for 6 weeks  . Meniscus repair Right   . Eus N/A 11/23/2013    Procedure: UPPER ENDOSCOPIC ULTRASOUND (EUS) LINEAR;  Surgeon: Milus Banister, MD;  Location: [facility];  Service: Endoscopy;  Laterality: N/A;  . Tonsillectomy    . Laparoscopy N/A 12/20/2013    Procedure: LAPAROSCOPY DIAGNOSTIC ;  Surgeon: [name], MD;  Location: [facility];  Service: General;  Laterality: N/A;  . Cholecystectomy N/A 12/20/2013    Procedure: LAPAROSCOPIC CHOLECYSTECTOMY;  Surgeon: [name], MD;  Location: [place];  Service: General;  Laterality: N/A;  . Splenectomy, total N/A 12/20/2013    Procedure: SPLENECTOMY;  Surgeon: [name], MD;  Location: [facility] [facility];  Service: General;  Laterality: N/A;  . Partial gastrectomy N/A 12/20/2013    Procedure: PARTIAL GASTRECTOMY;  Surgeon: [name], MD;  Location: [facility];  Service: General;  Laterality: N/A;  . Portacath placement N/A 01/15/2014    Procedure: INSERTION PORT-A-CATH;  Surgeon: [name], MD;  Location: [facility];  Service: General;  Laterality: N/A;    FAMILY HISTORY Family History  Problem Relation Age of Onset  .  Colon cancer Neg Hx   . Aneurysm Mother   . Stroke Mother   . Prostate cancer Father 62  . Skin cancer Brother 47    treated with interferon for 1 year  . Heart failure Paternal Aunt   . Breast cancer Paternal Aunt     dx over 65s  . Pancreatic cancer Maternal Grandmother     dx in her 15s  . Breast cancer Maternal Aunt     dx over 60  . Rheum arthritis Brother   . Breast cancer Paternal Aunt     dx over 86s  . Leukemia Paternal Aunt    the patient's father had a diagnosis of prostate cancer metastatic to the liver or a died at age 82. The patient's mother died secondary to carotid third brain aneurysm at the age of 6. The patient had 2 brothers, no sisters.  One brother has a history of melanoma. The patient's mother is mother was diagnosed with pancreatic cancer in her 62s. There is a history of breast cancer and second degree relatives, none before the age of 61  GYNECOLOGIC HISTORY:  Menarche age 64, first live birth age 22, the patient is Annette Beltran P2.. Stopped in her early 14s. 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 Her husband of 44 years, [name], retired from [employer] and currently works part-time as a Cabin crew with [employer]. Daughter Marita [place] teaches first grade. Daughter Leafy Ro is a homemaker. The patient has 4 grandchildren [name] has an additional 5 grandchildren of his own). The patient attends a [employer]    ADVANCED DIRECTIVES: In place   HEALTH MAINTENANCE: History  Substance Use Topics  . Smoking status: Former Smoker -- 0.50 packs/day for 28 years    Types: Cigarettes    Quit date: 11/02/2000  . Smokeless tobacco: Never Used  . Alcohol Use: Yes     Comment: occ wine      Colonoscopy:  PAP:  Bone density:  Lipid panel:  Mammography:  Allergies  Allergen Reactions  . Codeine Nausea And Vomiting  . Lactose Intolerance (Gi)   . Tequin     Severe stomach pain  . Penicillins Nausea Only and Rash    Current Outpatient Prescriptions  Medication Sig Dispense Refill  . ALPRAZolam (XANAX) 0.5 MG tablet Take 1 tablet (0.5 mg total) by mouth 2 (two) times daily as needed for anxiety.  60 tablet  5  . Ascorbic Acid (VITAMIN C PO) Take 1 tablet by mouth 2 (two) times daily.      Marland Kitchen aspirin EC 81 MG tablet Take 81 mg by mouth daily.      Marland Kitchen b complex vitamins tablet Take 1 tablet by mouth daily.      . diphenhydrAMINE (BENADRYL) 25 MG tablet Take 25 mg by mouth at bedtime.       . feeding supplement, RESOURCE BREEZE, (RESOURCE BREEZE) LIQD Take 1 Container by mouth 2 (two) times daily  between meals.  30 Container  3  . furosemide (LASIX) 20 MG tablet Take 20 mg by mouth daily as needed for fluid.      Marland Kitchen lidocaine-prilocaine (EMLA) cream Apply 1-2 hours to port a cath before procedure.  30 g  6  . lipase/protease/amylase (CREON-12/PANCREASE) 12000 UNITS CPEP capsule Take 2 capsules by mouth 3 (three) times daily with meals.  270 capsule  3  . loratadine (CLARITIN) 10 MG tablet Take 10 mg by mouth daily.      . Naproxen Sodium (ALEVE) 220 MG CAPS Take 440 mg by mouth 2 (two) times daily as needed (Pain).      . ondansetron (ZOFRAN) 8 MG tablet Take 1 tablet (8 mg total) by mouth 2 (two) times daily as needed (Nausea or vomiting).  30 tablet  1  . PARoxetine (PAXIL) 10 MG tablet Take 10 mg by mouth every morning.      . prochlorperazine (COMPAZINE) 10 MG tablet Take 1 tablet (10 mg total) by mouth every 6 (six) hours as needed (Nausea or vomiting).  30 tablet  1  . valACYclovir (VALTREX) 1000 MG tablet Take 1 tablet (1,000 mg total) by mouth 2 (two) times daily.  90 tablet  1   No current facility-administered medications for this visit.    OBJECTIVE: Middle-aged white woman in no acute distress Filed Vitals:   06/04/14 0827  BP: 128/75  Pulse: 69  Temp: 97.9 F (36.6 C)  Resp: 18     Body mass index is 26.66 kg/(m^2).    ECOG FS:1 - Symptomatic but completely ambulatory  Ocular: Sclerae unicteric, pupils round and equal Ear-nose-throat: Oropharynx  clear and moist Lymphatic: No cervical or supraclavicular adenopathy Lungs no rales or rhonchi Heart regular rate and rhythm Abd  soft, nontender including the left upper quadrant and epigastric areas, positive bowel sounds, no masses palpated MSK no focal spinal tenderness, no swelling or erythema or acral skin changes Neuro: non-focal, well-oriented, positive affect Breasts: Deferred  LAB RESULTS:  CMP     Component Value Date/Time   NA 139 06/01/2014 1155   NA 143 05/21/2014 0822   K 4.6 06/01/2014 1155   K 3.9  05/21/2014 0822   CL 100 06/01/2014 1155   CO2 27 06/01/2014 1155   CO2 25 05/21/2014 0822   GLUCOSE 101* 06/01/2014 1155   GLUCOSE 185* 05/21/2014 0822   BUN 13 06/01/2014 1155   BUN 11.1 05/21/2014 0822   CREATININE 0.59 06/01/2014 1155   CREATININE 0.7 05/21/2014 0822   CREATININE 0.62 11/28/2013 1635   CALCIUM 9.5 06/01/2014 1155   CALCIUM 9.4 05/21/2014 0822   PROT 6.8 05/21/2014 0822   PROT 5.0* 12/21/2013 0450   ALBUMIN 3.4* 05/21/2014 0822   ALBUMIN 2.3* 12/21/2013 0450   AST 48* 05/21/2014 0822   AST 42* 12/21/2013 0450   ALT 49 05/21/2014 0822   ALT 25 12/21/2013 0450   ALKPHOS 72 05/21/2014 0822   ALKPHOS 46 12/21/2013 0450   BILITOT 0.24 05/21/2014 0822   BILITOT 0.7 12/21/2013 0450   GFRNONAA >90 06/01/2014 1155   GFRAA >90 06/01/2014 1155    I No results found for this basename: SPEP,  UPEP,   kappa and lambda light chains    Lab Results  Component Value Date   WBC 27.7* 06/01/2014   NEUTROABS 19.4* 06/01/2014   HGB 11.1* 06/01/2014   HCT 30.6* 06/01/2014   MCV 98.7 06/01/2014   PLT 324 06/01/2014      Chemistry      Component Value Date/Time   NA 139 06/01/2014 1155   NA 143 05/21/2014 0822   K 4.6 06/01/2014 1155   K 3.9 05/21/2014 0822   CL 100 06/01/2014 1155   CO2 27 06/01/2014 1155   CO2 25 05/21/2014 0822   BUN 13 06/01/2014 1155   BUN 11.1 05/21/2014 0822   CREATININE 0.59 06/01/2014 1155   CREATININE  0.7 05/21/2014 0822   CREATININE 0.62 11/28/2013 1635      Component Value Date/Time   CALCIUM 9.5 06/01/2014 1155   CALCIUM 9.4 05/21/2014 0822   ALKPHOS 72 05/21/2014 0822   ALKPHOS 46 12/21/2013 0450   AST 48* 05/21/2014 0822   AST 42* 12/21/2013 0450   ALT 49 05/21/2014 0822   ALT 25 12/21/2013 0450   BILITOT 0.24 05/21/2014 0822   BILITOT 0.7 12/21/2013 0450     Results for TUWANDA, VOKES (MRN [id number]) as of 05/21/2014 09:16  Ref. Range 02/01/2014 08:21 02/22/2014 13:48 03/15/2014 14:59 05/07/2014 08:13 05/14/2014 08:21  CA 19-9 Latest Range: <35.0 U/mL 25.1 23.8 26.8 30.5 28.8     Lab Results  Component Value Date   LABCA2 8 06/08/2011    No components found with this basename: LABCA125     Recent Labs Lab 06/01/14 1155  INR 0.99    Urinalysis    Component Value Date/Time   COLORURINE YELLOW 06/07/2011 1942   APPEARANCEUR CLEAR 06/07/2011 1942   LABSPEC 1.010 02/15/2014 1109   LABSPEC 1.009 06/07/2011 1942   PHURINE 6.0 06/07/2011 1942   GLUCOSEU Negative 02/15/2014 [place] 06/07/2011 1942   HGBUR NEGATIVE 06/07/2011 1942   BILIRUBINUR neg 12/25/2011 [place] 06/07/2011 1942   KETONESUR NEGATIVE 06/07/2011 1942   PROTEINUR neg 12/25/2011 1216   PROTEINUR NEGATIVE 06/07/2011 1942   UROBILINOGEN 0.2 02/15/2014 1109   UROBILINOGEN negative 12/25/2011 1216   UROBILINOGEN 0.2 06/07/2011 1942   NITRITE neg 12/25/2011 1216   NITRITE NEGATIVE 06/07/2011 1942   LEUKOCYTESUR Negative 12/25/2011 1216    STUDIES: Ir Fluoro Guide Cv Line Right  06/01/2014   CLINICAL DATA:  Pancreas cancer, prior history of breast cancer, access for chemotherapy, malfunctioning left subclavian power port  EXAM: ULTRASOUND GUIDANCE FOR VASCULAR ACCESS  REMOVAL OF THE LEFT SUBCLAVIAN PORT CATHETER  ULTRASOUND AND FLUOROSCOPIC INSERTION OF A NEW LEFT IJ POWER PORT CATHETER PRESERVING THE EXISTING PORT CATHETER SUBCUTANEOUS POCKET.  Date:  7/31/20157/31/2015 2:46 pm  Radiologist:  M. Daryll Brod, MD  Guidance:  Ultrasound and fluoroscopic  FLUOROSCOPY TIME:  1 min 12 seconds  MEDICATIONS AND MEDICAL HISTORY: 1 g vancomycinadministered within 1 hour of the procedure.4 mg Versed said, 100 mcg fentanyl  ANESTHESIA/SEDATION: 45 min  CONTRAST:  None.  COMPLICATIONS: No immediate  PROCEDURE: Informed consent was obtained from the patient following explanation of the procedure, risks, benefits and alternatives. The patient understands, agrees and consents for the procedure. All questions were addressed. A time out was performed.  Maximal barrier sterile technique utilized including  caps, mask, sterile gowns, sterile gloves, large sterile drape, hand hygiene, and 2% chlorhexidine scrub.  Left subclavian port catheter removal: Initially under sterile conditions and local anesthesia, an incision was made along the existing left subclavian port catheter site. Utilizing sharp and blunt dissection, the left subclavian port catheter was removed entirely. Port catheter pocket was flushed with saline. There was adequate hemostasis. No signs of infection. Port catheter pocket will be preserved for a new left IJ power port.  Under sterile conditions and local anesthesia, left internal jugular micropuncture venous access was performed. Access was performed with ultrasound. Images were obtained for documentation. A guide wire was inserted followed by a transitional dilator. This allowed insertion of a guide wire and catheter into the IVC. Measurements were obtained from the SVC / RA junction back to the left IJ venotomy site. In the left infraclavicular chest, the existing subcutaneous  pocket was preserved. The port catheter was assembled and checked for leakage. The port catheter was secured in the pocket with two retention sutures. The tubing was tunneled subcutaneously to the left IJ venotomy site and inserted into the SVC/RA junction through a valved peel-away sheath. Position was confirmed with fluoroscopy. Images were obtained for documentation. The patient tolerated the procedure well. No immediate complications. Incisions were closed in a two layer fashion with 4 - 0 Vicryl suture. Dermabond was applied to the skin. The port catheter was accessed, blood was aspirated followed by saline and heparin flushes. Needle was removed. A dry sterile dressing was applied.  IMPRESSION: Successful removal of the malfunctioning left subclavian power port catheter.  Successful Ultrasound and fluoroscopically guided left internal jugular single lumen power port catheter insertion, utilizing the existing  subcutaneous pocket. Tip in the SVC/RA junction. Catheter ready for use.   Electronically Signed   By: Daryll Brod M.D.   On: 06/01/2014 15:12   Ir Removal Tun Access W/ Port W/o Fl Mod Sed  06/01/2014   CLINICAL DATA:  Pancreas cancer, prior history of breast cancer, access for chemotherapy, malfunctioning left subclavian power port  EXAM: ULTRASOUND GUIDANCE FOR VASCULAR ACCESS  REMOVAL OF THE LEFT SUBCLAVIAN PORT CATHETER  ULTRASOUND AND FLUOROSCOPIC INSERTION OF A NEW LEFT IJ POWER PORT CATHETER PRESERVING THE EXISTING PORT CATHETER SUBCUTANEOUS POCKET.  Date:  7/31/20157/31/2015 2:46 pm  Radiologist:  M. Daryll Brod, MD  Guidance:  Ultrasound and fluoroscopic  FLUOROSCOPY TIME:  1 min 12 seconds  MEDICATIONS AND MEDICAL HISTORY: 1 g vancomycinadministered within 1 hour of the procedure.4 mg Versed said, 100 mcg fentanyl  ANESTHESIA/SEDATION: 45 min  CONTRAST:  None.  COMPLICATIONS: No immediate  PROCEDURE: Informed consent was obtained from the patient following explanation of the procedure, risks, benefits and alternatives. The patient understands, agrees and consents for the procedure. All questions were addressed. A time out was performed.  Maximal barrier sterile technique utilized including caps, mask, sterile gowns, sterile gloves, large sterile drape, hand hygiene, and 2% chlorhexidine scrub.  Left subclavian port catheter removal: Initially under sterile conditions and local anesthesia, an incision was made along the existing left subclavian port catheter site. Utilizing sharp and blunt dissection, the left subclavian port catheter was removed entirely. Port catheter pocket was flushed with saline. There was adequate hemostasis. No signs of infection. Port catheter pocket will be preserved for a new left IJ power port.  Under sterile conditions and local anesthesia, left internal jugular micropuncture venous access was performed. Access was performed with ultrasound. Images were obtained for  documentation. A guide wire was inserted followed by a transitional dilator. This allowed insertion of a guide wire and catheter into the IVC. Measurements were obtained from the SVC / RA junction back to the left IJ venotomy site. In the left infraclavicular chest, the existing subcutaneous pocket was preserved. The port catheter was assembled and checked for leakage. The port catheter was secured in the pocket with two retention sutures. The tubing was tunneled subcutaneously to the left IJ venotomy site and inserted into the SVC/RA junction through a valved peel-away sheath. Position was confirmed with fluoroscopy. Images were obtained for documentation. The patient tolerated the procedure well. No immediate complications. Incisions were closed in a two layer fashion with 4 - 0 Vicryl suture. Dermabond was applied to the skin. The port catheter was accessed, blood was aspirated followed by saline and heparin flushes. Needle was removed. A dry sterile dressing was applied.  IMPRESSION:  Successful removal of the malfunctioning left subclavian power port catheter.  Successful Ultrasound and fluoroscopically guided left internal jugular single lumen power port catheter insertion, utilizing the existing subcutaneous pocket. Tip in the SVC/RA junction. Catheter ready for use.   Electronically Signed   By: Daryll Brod M.D.   On: 06/01/2014 15:12   Ir Cv Line Injection  05/28/2014   CLINICAL DATA:  Pancreatic neoplasm, needs access for chemotherapy. Port catheter won't aspirate.  EXAM: PORT CATHETER INJECTION UNDER FLUOROSCOPY  TECHNIQUE: The procedure, risks (including but not limited to bleeding, infection, organ damage ), benefits, and alternatives were explained to the patient. Questions regarding the procedure were encouraged and answered. The patient understands and consents to the procedure.  Survey fluoroscopic inspection reveals that the subcutaneous portion of the catheter tubing is looped back in the  subcutaneous tissues since initial placement, and the tip is now in the innominate vein. The port body projects lateral to the thoracic cage overlying the axilla.  A port access needle was placed by radiology RN, challenging due to the deep position of the port and inability to aspirate. Contrast injection initially demonstrated needle tip placement external to the port. The needle was repositioned. Injection demonstrates patency of the port reservoir and tubing. The innominate vein and SVC are widely patent. No extravasation.  IMPRESSION: 1. The port catheter tubing has pulled back, tip in the innominate vein. The reservoir is laterally placed and deep, difficult to access. The port was left accessed, and can continue to be used in this position. If long-term access needs are anticipated, consider port revision and repositioning of both the tip of the port catheter and port body.   Electronically Signed   By: Arne Cleveland M.D.   On: 05/28/2014 09:16   Ir US Guide Vasc Access Right  06/01/2014   CLINICAL DATA:  Pancreas cancer, prior history of breast cancer, access for chemotherapy, malfunctioning left subclavian power port  EXAM: ULTRASOUND GUIDANCE FOR VASCULAR ACCESS  REMOVAL OF THE LEFT SUBCLAVIAN PORT CATHETER  ULTRASOUND AND FLUOROSCOPIC INSERTION OF A NEW LEFT IJ POWER PORT CATHETER PRESERVING THE EXISTING PORT CATHETER SUBCUTANEOUS POCKET.  Date:  7/31/20157/31/2015 2:46 pm  Radiologist:  M. Daryll Brod, MD  Guidance:  Ultrasound and fluoroscopic  FLUOROSCOPY TIME:  1 min 12 seconds  MEDICATIONS AND MEDICAL HISTORY: 1 g vancomycinadministered within 1 hour of the procedure.4 mg Versed said, 100 mcg fentanyl  ANESTHESIA/SEDATION: 45 min  CONTRAST:  None.  COMPLICATIONS: No immediate  PROCEDURE: Informed consent was obtained from the patient following explanation of the procedure, risks, benefits and alternatives. The patient understands, agrees and consents for the procedure. All questions were  addressed. A time out was performed.  Maximal barrier sterile technique utilized including caps, mask, sterile gowns, sterile gloves, large sterile drape, hand hygiene, and 2% chlorhexidine scrub.  Left subclavian port catheter removal: Initially under sterile conditions and local anesthesia, an incision was made along the existing left subclavian port catheter site. Utilizing sharp and blunt dissection, the left subclavian port catheter was removed entirely. Port catheter pocket was flushed with saline. There was adequate hemostasis. No signs of infection. Port catheter pocket will be preserved for a new left IJ power port.  Under sterile conditions and local anesthesia, left internal jugular micropuncture venous access was performed. Access was performed with ultrasound. Images were obtained for documentation. A guide wire was inserted followed by a transitional dilator. This allowed insertion of a guide wire and catheter into the IVC. Measurements  were obtained from the SVC / RA junction back to the left IJ venotomy site. In the left infraclavicular chest, the existing subcutaneous pocket was preserved. The port catheter was assembled and checked for leakage. The port catheter was secured in the pocket with two retention sutures. The tubing was tunneled subcutaneously to the left IJ venotomy site and inserted into the SVC/RA junction through a valved peel-away sheath. Position was confirmed with fluoroscopy. Images were obtained for documentation. The patient tolerated the procedure well. No immediate complications. Incisions were closed in a two layer fashion with 4 - 0 Vicryl suture. Dermabond was applied to the skin. The port catheter was accessed, blood was aspirated followed by saline and heparin flushes. Needle was removed. A dry sterile dressing was applied.  IMPRESSION: Successful removal of the malfunctioning left subclavian power port catheter.  Successful Ultrasound and fluoroscopically guided left  internal jugular single lumen power port catheter insertion, utilizing the existing subcutaneous pocket. Tip in the SVC/RA junction. Catheter ready for use.   Electronically Signed   By: Daryll Brod M.D.   On: 06/01/2014 15:12    ASSESSMENT: 68 y.o. 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 noted on abdominal MRI November 2014  PANCREATIC CANCER: (3) status post endoscopic ultrasonography of the pancreas 11/23/2013 with cytology positive for adenocarcinoma, clinical stage T2 NX MX adenocarcinoma associated with a 5.5 cm pseudocyst (which was aspirated); baseline CA 19-9 was 47.0  (4) left pleural effusion status post thoracentesis 12/01/2013, cytology negative  (5) s/p laparoscopic cholecystectomy, open extended distal pancreatectomy, splenectomy, partial gastrectomy x2 12/20/2013 for a pT1 pN0, stage IA invasive ductal adenocarcinoma, grade 2, with positive resection margins  (6) adjuvant gemcitabine started 02/01/2014-- received 3 weekly doses before radiation, will reeive 9 additional doses starting 3-5 weeks after completion of radiation  (7) adjuvant radiation started 02/27/2014, with radiosensitization (capecitabine 1000 mg po BID on radiation days); completed 04/06/2014  (8) adjuvant gemcitabine weekly resumed 05/07/2014, with 9 doses  planned  PLAN: Ayana is doing well with the gemcitabine, with minimal side effects aside from cytopenias. 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 Likely she will not needed either when we treat her August 17, since she will have had a 2 week break during which her bone marrow can "catch up". At any rate the plan continues for a total of 9 doses of adjuvant gemcitabine. His been she will need one final dose added at the end of cycle 4  It is very favorable that [name] CT of the chest shows no evidence of metastatic sarcoma. We will be doing CTs of the chest every 3 months the next year or so so we will be following the area of groundglass opacity and the ascending aortic aneurysm. We are also waiting on path review from [facility]  Chauncey Cruel, MD   06/04/2014 8:51 AM

## 2014-06-04 NOTE — Telephone Encounter (Signed)
Per [facility] staff phone call scheduled appts. Advised schedulers 

## 2014-06-04 NOTE — Progress Notes (Signed)
Denies diarrhea. Reports she has an excellent appetite. Eating all the things she wants to. Reports one episode of nausea and vomiting last week which resolved with compazine and zofran. Reports fatigue continues. Weight and vitals stable. Denies headache, dizziness or night sweat.

## 2014-06-04 NOTE — Patient Instructions (Signed)
[facility] Discharge Instructions for Patients Receiving Chemotherapy  Today you received the following chemotherapy agents :  Gemcitabine.  To help prevent nausea and vomiting after your treatment, we encourage you to take your nausea medication as instructed by your physician.   If you develop nausea and vomiting that is not controlled by your nausea medication, call the clinic.   BELOW ARE SYMPTOMS THAT SHOULD BE REPORTED IMMEDIATELY:  *FEVER GREATER THAN 100.5 F  *CHILLS WITH OR WITHOUT FEVER  NAUSEA AND VOMITING THAT IS NOT CONTROLLED WITH YOUR NAUSEA MEDICATION  *UNUSUAL SHORTNESS OF BREATH  *UNUSUAL BRUISING OR BLEEDING  TENDERNESS IN MOUTH AND THROAT WITH OR WITHOUT PRESENCE OF ULCERS  *URINARY PROBLEMS  *BOWEL PROBLEMS  UNUSUAL RASH Items with * indicate a potential emergency and should be followed up as soon as possible.  Feel free to call the clinic you have any questions or concerns. The clinic phone number is [phone].

## 2014-06-05 ENCOUNTER — Other Ambulatory Visit: Payer: Self-pay | Admitting: Internal Medicine

## 2014-06-11 ENCOUNTER — Ambulatory Visit: Payer: BC Managed Care – PPO

## 2014-06-11 ENCOUNTER — Other Ambulatory Visit: Payer: BC Managed Care – PPO

## 2014-06-18 ENCOUNTER — Ambulatory Visit: Payer: BC Managed Care – PPO

## 2014-06-18 ENCOUNTER — Ambulatory Visit (HOSPITAL_BASED_OUTPATIENT_CLINIC_OR_DEPARTMENT_OTHER): Payer: Medicare Other | Admitting: Oncology

## 2014-06-18 ENCOUNTER — Telehealth: Payer: Self-pay | Admitting: Oncology

## 2014-06-18 ENCOUNTER — Other Ambulatory Visit: Payer: Self-pay | Admitting: Oncology

## 2014-06-18 ENCOUNTER — Telehealth: Payer: Self-pay | Admitting: *Deleted

## 2014-06-18 ENCOUNTER — Ambulatory Visit (HOSPITAL_BASED_OUTPATIENT_CLINIC_OR_DEPARTMENT_OTHER): Payer: Medicare Other

## 2014-06-18 ENCOUNTER — Other Ambulatory Visit (HOSPITAL_BASED_OUTPATIENT_CLINIC_OR_DEPARTMENT_OTHER): Payer: Medicare Other

## 2014-06-18 VITALS — BP 128/49 | HR 49 | Temp 98.4°F | Resp 18 | Ht 64.0 in | Wt 155.3 lb

## 2014-06-18 DIAGNOSIS — M8580 Other specified disorders of bone density and structure, unspecified site: Secondary | ICD-10-CM

## 2014-06-18 DIAGNOSIS — Z853 Personal history of malignant neoplasm of breast: Secondary | ICD-10-CM

## 2014-06-18 DIAGNOSIS — Z5111 Encounter for antineoplastic chemotherapy: Secondary | ICD-10-CM

## 2014-06-18 DIAGNOSIS — C252 Malignant neoplasm of tail of pancreas: Secondary | ICD-10-CM

## 2014-06-18 DIAGNOSIS — C251 Malignant neoplasm of body of pancreas: Secondary | ICD-10-CM

## 2014-06-18 DIAGNOSIS — J90 Pleural effusion, not elsewhere classified: Secondary | ICD-10-CM

## 2014-06-18 DIAGNOSIS — D735 Infarction of spleen: Secondary | ICD-10-CM

## 2014-06-18 DIAGNOSIS — I700 Atherosclerosis of aorta: Secondary | ICD-10-CM

## 2014-06-18 DIAGNOSIS — K52831 Collagenous colitis: Secondary | ICD-10-CM

## 2014-06-18 DIAGNOSIS — C50911 Malignant neoplasm of unspecified site of right female breast: Secondary | ICD-10-CM

## 2014-06-18 DIAGNOSIS — D7389 Other diseases of spleen: Secondary | ICD-10-CM

## 2014-06-18 DIAGNOSIS — E440 Moderate protein-calorie malnutrition: Secondary | ICD-10-CM

## 2014-06-18 LAB — CBC WITH DIFFERENTIAL/PLATELET
BASO%: 1.5 % (ref 0.0–2.0)
Basophils Absolute: 0.1 10*3/uL (ref 0.0–0.1)
EOS%: 5.1 % (ref 0.0–7.0)
Eosinophils Absolute: 0.3 10*3/uL (ref 0.0–0.5)
HEMATOCRIT: 33.6 % — AB (ref 34.8–46.6)
HEMOGLOBIN: 11.7 g/dL (ref 11.6–15.9)
LYMPH%: 13.3 % — ABNORMAL LOW (ref 14.0–49.7)
MCH: 35.3 pg — AB (ref 25.1–34.0)
MCHC: 34.8 g/dL (ref 31.5–36.0)
MCV: 101.5 fL — AB (ref 79.5–101.0)
MONO#: 1.2 10*3/uL — ABNORMAL HIGH (ref 0.1–0.9)
MONO%: 22.2 % — AB (ref 0.0–14.0)
NEUT#: 3 10*3/uL (ref 1.5–6.5)
NEUT%: 57.9 % (ref 38.4–76.8)
Platelets: 828 10*3/uL — ABNORMAL HIGH (ref 145–400)
RBC: 3.31 10*6/uL — ABNORMAL LOW (ref 3.70–5.45)
RDW: 17.7 % — AB (ref 11.2–14.5)
WBC: 5.3 10*3/uL (ref 3.9–10.3)
lymph#: 0.7 10*3/uL — ABNORMAL LOW (ref 0.9–3.3)
nRBC: 0 % (ref 0–0)

## 2014-06-18 MED ORDER — SODIUM CHLORIDE 0.9 % IJ SOLN
10.0000 mL | INTRAMUSCULAR | Status: DC | PRN
  Administered 2014-06-18: 10 mL
  Filled 2014-06-18: qty 10

## 2014-06-18 MED ORDER — PROCHLORPERAZINE MALEATE 10 MG PO TABS
10.0000 mg | ORAL_TABLET | Freq: Once | ORAL | Status: AC
  Administered 2014-06-18: 10 mg via ORAL

## 2014-06-18 MED ORDER — SODIUM CHLORIDE 0.9 % IV SOLN
Freq: Once | INTRAVENOUS | Status: AC
  Administered 2014-06-18: 11:00:00 via INTRAVENOUS

## 2014-06-18 MED ORDER — PROCHLORPERAZINE MALEATE 10 MG PO TABS
ORAL_TABLET | ORAL | Status: AC
  Filled 2014-06-18: qty 1

## 2014-06-18 MED ORDER — SODIUM CHLORIDE 0.9 % IV SOLN
1000.0000 mg/m2 | Freq: Once | INTRAVENOUS | Status: AC
  Administered 2014-06-18: 1748 mg via INTRAVENOUS
  Filled 2014-06-18: qty 45.97

## 2014-06-18 MED ORDER — HEPARIN SOD (PORK) LOCK FLUSH 100 UNIT/ML IV SOLN
500.0000 [IU] | Freq: Once | INTRAVENOUS | Status: AC | PRN
  Administered 2014-06-18: 500 [IU]
  Filled 2014-06-18: qty 5

## 2014-06-18 NOTE — Telephone Encounter (Signed)
per [facility] to sch pt [id number] to override appts for GM-will sch & sch gave pt copy of sch

## 2014-06-18 NOTE — Telephone Encounter (Signed)
no lab added on 9/8-GM will do add on before inf

## 2014-06-18 NOTE — Progress Notes (Signed)
[place]  [phone] [phone]     ID: Annette Beltran OB: 1946-03-14  MR#: [id number]  URK#:[id number]  PCP: Annette Showers, MD GYN:   SU: Annette Beltran OTHER MD: Annette Beltran, Annette Beltran  CHIEF COMPLAINT: Early stage pancreatic cancer resected with positive margins  CURRENT TREATMENT: Adjuvant gemcitabine   PANCREATIC CANCER HISTORY: I formerly followed Annette Beltran for a stage I breast cancer, which was treated with surgery, radiation, and anti-estrogens. She was released from followup here in 2007. In addition, in 2012 she had abdominal pain leading to hospitalization. 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 The patient also has a history of collagenous colitis, which of course carries its own set of symptoms, making identification of her new attic problem more difficult.  Annette Beltran tells me she started "feeling bad" in September of 2014. There was abdominal discomfort localizing to the left upper o'clock on, worse with inspiration. CT angiography 08/21/2013 showed no clot and no significant abnormalities. Plain rib and left shoulder views were negative. CT abdomen and pelvis with contrast 08/22/2013 showed slight prominence of the pancreatic duct in the distal body and tail with a new small structure emanating from the tail of the pancreas consistent with a pseudocyst. Exudate near the tail of the pancreas suggested pancreatitis. MRI of the abdomen 09/03/2013 confirmed several ill-defined fluid collections between the gastric fundus and the splenic hilum surrounding the pancreatic tail. There was no evidence of a pancreatic mass. The pancreatic tail did enhanced following contrast. The splenic vein appeared chronically thrombosed with a small amount of  nonocclusive thrombus extending into the main and left portal veins.  On 09/26/2013 amylase was 237 and lipase 513.0, consistent with acute pancreatitis. These numbers subsequently dropped in on 11/28/2013 amylase was normal at 47 and lipase was mildly elevated at 110  With continuing left-sided abdominal discomfort, abdominal ultrasound 11/13/2013 showed multiple gallstones with no evidence of cholecystitis. Evaluation of the pancreas showed a hypoechoic lobulated focus measuring up to 7.4 cm felt to be nonspecific. Repeat abdominal CT 11/14/2013 showed diffuse pancreatic atrophy without evidence of a mass, again consistent with chronic pancreatitis. There was mild soft tissue stranding suspicious for mild superimposed acute pancreatitis. A new rim-enhancing fluid collection was noted in the gastrosplenic ligament measuring 4.5 cm, consistent with a pseudocyst. Finally on 11/23/2013, endoscopic ultrasonography under Annette Beltran showed a subtle 2.5 cm soft tissue mass in the mid-pancreas, abutting the splenic vein. This was biopsied x2, with cytology ([id number]) showing adenocarcinoma. A peripancreatic fluid collection measuring over 5 cm was aspirated yielding 25 cc of chocolate colored fluid. Two or three scattered round hypoechoic lymph nodes were noted near the pancreatic tail but were not sampled.  In addition, on 11/28/2013 a chest x-ray obtained to evaluate upper respiratory symptoms showed a small to moderate left effusion. Chest CT scan 11/29/2013 confirmed a moderate left-sided pleural effusion with no suspicious appearing pulmonary nodules or masses associated with it. The complex cystic lesion associated with the tail of the pancreas was again noted, measuring 5.5 cm despite the recent aspiration procedure.  The patient's subsequent history is as detailed below.  INTERVAL HISTORY: Annette Beltran returns today for followup of Annette Beltran's pancreatic cancer. Today is day 1 cycle 4 of 4 planned cycles of  gemcitabine given days 1, 8, and 15  of each 21 day cycle. Day 15 cycle 3 treatment was postponed because of her work situation and will be made up at the very end. Incidentally, her husband Annette Beltran is receiving radiation treatments at this point.  REVIEW OF SYSTEMS: Annette Beltran did very well with her "show", and picked up a little bit on her energy since she missed a week of treatment. She has minimal mouth sores. She is on acyclovir for that. Otherwise a detailed review of systems today was entirely stable  PAST MEDICAL HISTORY: Past Medical History  Diagnosis Date  . Macular degeneration   . Fluid retention   . Colitis, collagenous   . Vitamin D deficiency   . Osteopenia   . Celiac artery stenosis   . Splenic infarction   . Arthritis   . History of blood clots   . Anxiety   . Shingles   . Pancreatitis   . Osteoporosis   . Clotting disorder   . Breast cancer     rt lumpectomy  . Arrhythmia     "skips a beat"  Annette Beltran  heart  . GERD (gastroesophageal reflux disease)     PAST SURGICAL HISTORY: Past Surgical History  Procedure Laterality Date  . Foot surgery  2003    x 3, 2 on right, 1 on left  . Breast lumpectomy Right     radiation for 6 weeks  . Meniscus repair Right   . Eus N/A 11/23/2013    Procedure: UPPER ENDOSCOPIC ULTRASOUND (EUS) LINEAR;  Surgeon: Annette Banister, MD;  Location: [facility];  Service: Endoscopy;  Laterality: N/A;  . Tonsillectomy    . Laparoscopy N/A 12/20/2013    Procedure: LAPAROSCOPY DIAGNOSTIC ;  Surgeon: Annette Klein, MD;  Location: [facility];  Service: General;  Laterality: N/A;  . Cholecystectomy N/A 12/20/2013    Procedure: LAPAROSCOPIC CHOLECYSTECTOMY;  Surgeon: Annette Klein, MD;  Location: [facility];  Service: General;  Laterality: N/A;  . Splenectomy, total N/A 12/20/2013    Procedure: SPLENECTOMY;  Surgeon: Annette Klein, MD;  Location: [facility];  Service: General;  Laterality: N/A;  . Partial gastrectomy N/A 12/20/2013    Procedure: PARTIAL GASTRECTOMY;   Surgeon: Annette Klein, MD;  Location: [facility];  Service: General;  Laterality: N/A;  . Portacath placement N/A 01/15/2014    Procedure: INSERTION PORT-A-CATH;  Surgeon: Annette Klein, MD;  Location: [facility];  Service: General;  Laterality: N/A;    FAMILY HISTORY Family History  Problem Relation Age of Onset  . Colon cancer Neg Hx   . Aneurysm Mother   . Stroke Mother   . Prostate cancer Father 65  . Skin cancer Brother 78    treated with interferon for 1 year  . Heart failure Paternal Aunt   . Breast cancer Paternal Aunt     dx over 82s  . Pancreatic cancer Maternal Grandmother     dx in her 28s  . Breast cancer Maternal Aunt     dx over 83  . Rheum arthritis Brother   . Breast cancer Paternal Aunt     dx over 41s  . Leukemia Paternal Aunt    the patient's father had a diagnosis of prostate cancer metastatic to the liver or a died at age 70. The patient's mother died secondary to carotid third brain aneurysm at the age of 67. The patient had 2 brothers, no sisters. One brother has a history of melanoma. The patient's mother is mother was diagnosed with pancreatic cancer in her 84s. There is  a history of breast cancer and second degree relatives, none before the age of 66  GYNECOLOGIC HISTORY:  Menarche age 42, first live birth age 40, the patient is [place] P2.. Stopped in her early 13s. 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 Her husband of 59 years, Annette Beltran, retired from [employer] and currently works part-time as a Cabin crew with [employer]. Daughter Marita [place] teaches first grade. Daughter Leafy Ro is a homemaker. The patient has 4 grandchildren Annette Beltran has an additional 5 grandchildren of his own). The patient attends a [employer]    ADVANCED DIRECTIVES: In place   HEALTH MAINTENANCE: History  Substance Use Topics  . Smoking status: Former Smoker --  0.50 packs/day for 28 years    Types: Cigarettes    Quit date: 11/02/2000  . Smokeless tobacco: Never Used  . Alcohol Use: Yes     Comment: occ wine      Colonoscopy:  PAP:  Bone density:  Lipid panel:  Mammography:  Allergies  Allergen Reactions  . Tape     Allergic  To  Tegaderm.  . Codeine Nausea And Vomiting  . Lactose Intolerance (Gi)   . Tequin     Severe stomach pain  . Penicillins Nausea Only and Rash    Current Outpatient Prescriptions  Medication Sig Dispense Refill  . ALPRAZolam (XANAX) 0.5 MG tablet Take 1 tablet (0.5 mg total) by mouth 2 (two) times daily as needed for anxiety.  60 tablet  5  . antiseptic oral rinse (BIOTENE) LIQD 15 mLs by Mouth Rinse route as needed for dry mouth.      . Ascorbic Acid (VITAMIN C PO) Take 1 tablet by mouth 2 (two) times daily.      Marland Kitchen aspirin EC 81 MG tablet Take 81 mg by mouth daily.      Marland Kitchen b complex vitamins tablet Take 1 tablet by mouth daily.      . Biotin 1 MG CAPS Take by mouth.      . diphenhydrAMINE (BENADRYL) 25 MG tablet Take 25 mg by mouth at bedtime.       . feeding supplement, RESOURCE BREEZE, (RESOURCE BREEZE) LIQD Take 1 Container by mouth 2 (two) times daily between meals.  30 Container  3  . furosemide (LASIX) 20 MG tablet Take 20 mg by mouth daily as needed for fluid.      Marland Kitchen lidocaine-prilocaine (EMLA) cream Apply 1-2 hours to port a cath before procedure.  30 g  6  . lipase/protease/amylase (CREON-12/PANCREASE) 12000 UNITS CPEP capsule Take 2 capsules by mouth 3 (three) times daily with meals.  270 capsule  3  . loratadine (CLARITIN) 10 MG tablet Take 10 mg by mouth daily.      . Naproxen Sodium (ALEVE) 220 MG CAPS Take 440 mg by mouth 2 (two) times daily as needed (Pain).      . ondansetron (ZOFRAN) 8 MG tablet Take 1 tablet (8 mg total) by mouth 2 (two) times daily as needed (Nausea or vomiting).  30 tablet  1  . PARoxetine (PAXIL) 10 MG tablet Take 10 mg by mouth every morning.      Marland Kitchen PARoxetine (PAXIL) 10  MG tablet TAKE ONE TABLET BY MOUTH ONCE DAILY  30 tablet  7  . prochlorperazine (COMPAZINE) 10 MG tablet Take 1 tablet (10 mg total) by mouth every 6 (six)  hours as needed (Nausea or vomiting).  30 tablet  1  . valACYclovir (VALTREX) 1000 MG tablet Take 1 tablet (1,000 mg total) by mouth 2 (two) times daily.  90 tablet  1   No current facility-administered medications for this visit.    OBJECTIVE: Middle-aged white woman who appears stated age 34 Vitals:   06/18/14 0928  BP: 128/49  Pulse: 49  Temp: 98.4 F (36.9 C)  Resp: 18     Body mass index is 26.64 kg/(m^2).    ECOG FS:1 - Symptomatic but completely ambulatory  Sclerae unicteric, EOMs intact Oropharynx clear and moist-- no lesions noted No cervical or supraclavicular adenopathy Lungs no rales or rhonchi Heart regular rate and rhythm Abd soft, nontender, positive bowel sounds, no masses palpated MSK no focal spinal tenderness, no upper extremity lymphedema Neuro: nonfocal, well oriented, appropriate affect Breasts: Deferred    LAB RESULTS:  CMP     Component Value Date/Time   NA 139 06/01/2014 1155   NA 143 05/21/2014 0822   K 4.6 06/01/2014 1155   K 3.9 05/21/2014 0822   CL 100 06/01/2014 1155   CO2 27 06/01/2014 1155   CO2 25 05/21/2014 0822   GLUCOSE 101* 06/01/2014 1155   GLUCOSE 185* 05/21/2014 0822   BUN 13 06/01/2014 1155   BUN 11.1 05/21/2014 0822   CREATININE 0.59 06/01/2014 1155   CREATININE 0.7 05/21/2014 0822   CREATININE 0.62 11/28/2013 1635   CALCIUM 9.5 06/01/2014 1155   CALCIUM 9.4 05/21/2014 0822   PROT 6.8 05/21/2014 0822   PROT 5.0* 12/21/2013 0450   ALBUMIN 3.4* 05/21/2014 0822   ALBUMIN 2.3* 12/21/2013 0450   AST 48* 05/21/2014 0822   AST 42* 12/21/2013 0450   ALT 49 05/21/2014 0822   ALT 25 12/21/2013 0450   ALKPHOS 72 05/21/2014 0822   ALKPHOS 46 12/21/2013 0450   BILITOT 0.24 05/21/2014 0822   BILITOT 0.7 12/21/2013 0450   GFRNONAA >90 06/01/2014 1155   GFRAA >90 06/01/2014 1155    I No results  found for this basename: SPEP,  UPEP,   kappa and lambda light chains    Lab Results  Component Value Date   WBC 5.3 06/18/2014   NEUTROABS 3.0 06/18/2014   HGB 11.7 06/18/2014   HCT 33.6* 06/18/2014   MCV 101.5* 06/18/2014   PLT 828* 06/18/2014      Chemistry      Component Value Date/Time   NA 139 06/01/2014 1155   NA 143 05/21/2014 0822   K 4.6 06/01/2014 1155   K 3.9 05/21/2014 0822   CL 100 06/01/2014 1155   CO2 27 06/01/2014 1155   CO2 25 05/21/2014 0822   BUN 13 06/01/2014 1155   BUN 11.1 05/21/2014 0822   CREATININE 0.59 06/01/2014 1155   CREATININE 0.7 05/21/2014 0822   CREATININE 0.62 11/28/2013 1635      Component Value Date/Time   CALCIUM 9.5 06/01/2014 1155   CALCIUM 9.4 05/21/2014 0822   ALKPHOS 72 05/21/2014 0822   ALKPHOS 46 12/21/2013 0450   AST 48* 05/21/2014 0822   AST 42* 12/21/2013 0450   ALT 49 05/21/2014 0822   ALT 25 12/21/2013 0450   BILITOT 0.24 05/21/2014 0822   BILITOT 0.7 12/21/2013 0450     Results for SHAROL, CROGHAN (MRN [id number]) as of 06/18/2014 09:36  Ref. Range 05/07/2014 08:13 05/14/2014 08:21 05/21/2014 08:22 05/28/2014 09:23 06/04/2014 08:14  CA 19-9 Latest Range: <35.0 U/mL 30.5 28.8 23.9 21.0 25.1  Lab Results  Component Value Date   LABCA2 8 06/08/2011    No components found with this basename: [id number]    No results found for this basename: INR,  in the last 168 hours  Urinalysis    Component Value Date/Time   COLORURINE YELLOW 06/07/2011 1942   APPEARANCEUR CLEAR 06/07/2011 1942   LABSPEC 1.010 02/15/2014 1109   LABSPEC 1.009 06/07/2011 1942   PHURINE 6.0 06/07/2011 1942   GLUCOSEU Negative 02/15/2014 [place] 06/07/2011 1942   HGBUR NEGATIVE 06/07/2011 1942   BILIRUBINUR neg 12/25/2011 [place] 06/07/2011 1942   KETONESUR NEGATIVE 06/07/2011 1942   PROTEINUR neg 12/25/2011 1216   PROTEINUR NEGATIVE 06/07/2011 1942   UROBILINOGEN 0.2 02/15/2014 1109   UROBILINOGEN negative 12/25/2011 1216   UROBILINOGEN 0.2 06/07/2011 1942    NITRITE neg 12/25/2011 1216   NITRITE NEGATIVE 06/07/2011 1942   LEUKOCYTESUR Negative 12/25/2011 1216    STUDIES: Ir Fluoro Guide Cv Line Right  06/01/2014   CLINICAL DATA:  Pancreas cancer, prior history of breast cancer, access for chemotherapy, malfunctioning left subclavian power port  EXAM: ULTRASOUND GUIDANCE FOR VASCULAR ACCESS  REMOVAL OF THE LEFT SUBCLAVIAN PORT CATHETER  ULTRASOUND AND FLUOROSCOPIC INSERTION OF A NEW LEFT IJ POWER PORT CATHETER PRESERVING THE EXISTING PORT CATHETER SUBCUTANEOUS POCKET.  Date:  7/31/20157/31/2015 2:46 pm  Radiologist:  M. Daryll Brod, MD  Guidance:  Ultrasound and fluoroscopic  FLUOROSCOPY TIME:  1 min 12 seconds  MEDICATIONS AND MEDICAL HISTORY: 1 g vancomycinadministered within 1 hour of the procedure.4 mg Versed said, 100 mcg fentanyl  ANESTHESIA/SEDATION: 45 min  CONTRAST:  None.  COMPLICATIONS: No immediate  PROCEDURE: Informed consent was obtained from the patient following explanation of the procedure, risks, benefits and alternatives. The patient understands, agrees and consents for the procedure. All questions were addressed. A time out was performed.  Maximal barrier sterile technique utilized including caps, mask, sterile gowns, sterile gloves, large sterile drape, hand hygiene, and 2% chlorhexidine scrub.  Left subclavian port catheter removal: Initially under sterile conditions and local anesthesia, an incision was made along the existing left subclavian port catheter site. Utilizing sharp and blunt dissection, the left subclavian port catheter was removed entirely. Port catheter pocket was flushed with saline. There was adequate hemostasis. No signs of infection. Port catheter pocket will be preserved for a new left IJ power port.  Under sterile conditions and local anesthesia, left internal jugular micropuncture venous access was performed. Access was performed with ultrasound. Images were obtained for documentation. A guide wire was inserted followed  by a transitional dilator. This allowed insertion of a guide wire and catheter into the IVC. Measurements were obtained from the SVC / RA junction back to the left IJ venotomy site. In the left infraclavicular chest, the existing subcutaneous pocket was preserved. The port catheter was assembled and checked for leakage. The port catheter was secured in the pocket with two retention sutures. The tubing was tunneled subcutaneously to the left IJ venotomy site and inserted into the SVC/RA junction through a valved peel-away sheath. Position was confirmed with fluoroscopy. Images were obtained for documentation. The patient tolerated the procedure well. No immediate complications. Incisions were closed in a two layer fashion with 4 - 0 Vicryl suture. Dermabond was applied to the skin. The port catheter was accessed, blood was aspirated followed by saline and heparin flushes. Needle was removed. A dry sterile dressing was applied.  IMPRESSION: Successful removal of the malfunctioning left subclavian power  port catheter.  Successful Ultrasound and fluoroscopically guided left internal jugular single lumen power port catheter insertion, utilizing the existing subcutaneous pocket. Tip in the SVC/RA junction. Catheter ready for use.   Electronically Signed   By: Daryll Brod M.D.   On: 06/01/2014 15:12   Ir Removal Tun Access W/ Port W/o Fl Mod Sed  06/01/2014   CLINICAL DATA:  Pancreas cancer, prior history of breast cancer, access for chemotherapy, malfunctioning left subclavian power port  EXAM: ULTRASOUND GUIDANCE FOR VASCULAR ACCESS  REMOVAL OF THE LEFT SUBCLAVIAN PORT CATHETER  ULTRASOUND AND FLUOROSCOPIC INSERTION OF A NEW LEFT IJ POWER PORT CATHETER PRESERVING THE EXISTING PORT CATHETER SUBCUTANEOUS POCKET.  Date:  7/31/20157/31/2015 2:46 pm  Radiologist:  M. Daryll Brod, MD  Guidance:  Ultrasound and fluoroscopic  FLUOROSCOPY TIME:  1 min 12 seconds  MEDICATIONS AND MEDICAL HISTORY: 1 g vancomycinadministered  within 1 hour of the procedure.4 mg Versed said, 100 mcg fentanyl  ANESTHESIA/SEDATION: 45 min  CONTRAST:  None.  COMPLICATIONS: No immediate  PROCEDURE: Informed consent was obtained from the patient following explanation of the procedure, risks, benefits and alternatives. The patient understands, agrees and consents for the procedure. All questions were addressed. A time out was performed.  Maximal barrier sterile technique utilized including caps, mask, sterile gowns, sterile gloves, large sterile drape, hand hygiene, and 2% chlorhexidine scrub.  Left subclavian port catheter removal: Initially under sterile conditions and local anesthesia, an incision was made along the existing left subclavian port catheter site. Utilizing sharp and blunt dissection, the left subclavian port catheter was removed entirely. Port catheter pocket was flushed with saline. There was adequate hemostasis. No signs of infection. Port catheter pocket will be preserved for a new left IJ power port.  Under sterile conditions and local anesthesia, left internal jugular micropuncture venous access was performed. Access was performed with ultrasound. Images were obtained for documentation. A guide wire was inserted followed by a transitional dilator. This allowed insertion of a guide wire and catheter into the IVC. Measurements were obtained from the SVC / RA junction back to the left IJ venotomy site. In the left infraclavicular chest, the existing subcutaneous pocket was preserved. The port catheter was assembled and checked for leakage. The port catheter was secured in the pocket with two retention sutures. The tubing was tunneled subcutaneously to the left IJ venotomy site and inserted into the SVC/RA junction through a valved peel-away sheath. Position was confirmed with fluoroscopy. Images were obtained for documentation. The patient tolerated the procedure well. No immediate complications. Incisions were closed in a two layer fashion  with 4 - 0 Vicryl suture. Dermabond was applied to the skin. The port catheter was accessed, blood was aspirated followed by saline and heparin flushes. Needle was removed. A dry sterile dressing was applied.  IMPRESSION: Successful removal of the malfunctioning left subclavian power port catheter.  Successful Ultrasound and fluoroscopically guided left internal jugular single lumen power port catheter insertion, utilizing the existing subcutaneous pocket. Tip in the SVC/RA junction. Catheter ready for use.   Electronically Signed   By: Daryll Brod M.D.   On: 06/01/2014 15:12   Ir Cv Line Injection  05/28/2014   CLINICAL DATA:  Pancreatic neoplasm, needs access for chemotherapy. Port catheter won't aspirate.  EXAM: PORT CATHETER INJECTION UNDER FLUOROSCOPY  TECHNIQUE: The procedure, risks (including but not limited to bleeding, infection, organ damage ), benefits, and alternatives were explained to the patient. Questions regarding the procedure were encouraged and answered. The patient  understands and consents to the procedure.  Survey fluoroscopic inspection reveals that the subcutaneous portion of the catheter tubing is looped back in the subcutaneous tissues since initial placement, and the tip is now in the innominate vein. The port body projects lateral to the thoracic cage overlying the axilla.  A port access needle was placed by radiology RN, challenging due to the deep position of the port and inability to aspirate. Contrast injection initially demonstrated needle tip placement external to the port. The needle was repositioned. Injection demonstrates patency of the port reservoir and tubing. The innominate vein and SVC are widely patent. No extravasation.  IMPRESSION: 1. The port catheter tubing has pulled back, tip in the innominate vein. The reservoir is laterally placed and deep, difficult to access. The port was left accessed, and can continue to be used in this position. If long-term access needs  are anticipated, consider port revision and repositioning of both the tip of the port catheter and port body.   Electronically Signed   By: Arne Cleveland M.D.   On: 05/28/2014 09:16   Ir US Guide Vasc Access Right  06/01/2014   CLINICAL DATA:  Pancreas cancer, prior history of breast cancer, access for chemotherapy, malfunctioning left subclavian power port  EXAM: ULTRASOUND GUIDANCE FOR VASCULAR ACCESS  REMOVAL OF THE LEFT SUBCLAVIAN PORT CATHETER  ULTRASOUND AND FLUOROSCOPIC INSERTION OF A NEW LEFT IJ POWER PORT CATHETER PRESERVING THE EXISTING PORT CATHETER SUBCUTANEOUS POCKET.  Date:  7/31/20157/31/2015 2:46 pm  Radiologist:  M. Daryll Brod, MD  Guidance:  Ultrasound and fluoroscopic  FLUOROSCOPY TIME:  1 min 12 seconds  MEDICATIONS AND MEDICAL HISTORY: 1 g vancomycinadministered within 1 hour of the procedure.4 mg Versed said, 100 mcg fentanyl  ANESTHESIA/SEDATION: 45 min  CONTRAST:  None.  COMPLICATIONS: No immediate  PROCEDURE: Informed consent was obtained from the patient following explanation of the procedure, risks, benefits and alternatives. The patient understands, agrees and consents for the procedure. All questions were addressed. A time out was performed.  Maximal barrier sterile technique utilized including caps, mask, sterile gowns, sterile gloves, large sterile drape, hand hygiene, and 2% chlorhexidine scrub.  Left subclavian port catheter removal: Initially under sterile conditions and local anesthesia, an incision was made along the existing left subclavian port catheter site. Utilizing sharp and blunt dissection, the left subclavian port catheter was removed entirely. Port catheter pocket was flushed with saline. There was adequate hemostasis. No signs of infection. Port catheter pocket will be preserved for a new left IJ power port.  Under sterile conditions and local anesthesia, left internal jugular micropuncture venous access was performed. Access was performed with ultrasound. Images  were obtained for documentation. A guide wire was inserted followed by a transitional dilator. This allowed insertion of a guide wire and catheter into the IVC. Measurements were obtained from the SVC / RA junction back to the left IJ venotomy site. In the left infraclavicular chest, the existing subcutaneous pocket was preserved. The port catheter was assembled and checked for leakage. The port catheter was secured in the pocket with two retention sutures. The tubing was tunneled subcutaneously to the left IJ venotomy site and inserted into the SVC/RA junction through a valved peel-away sheath. Position was confirmed with fluoroscopy. Images were obtained for documentation. The patient tolerated the procedure well. No immediate complications. Incisions were closed in a two layer fashion with 4 - 0 Vicryl suture. Dermabond was applied to the skin. The port catheter was accessed, blood was aspirated followed by  saline and heparin flushes. Needle was removed. A dry sterile dressing was applied.  IMPRESSION: Successful removal of the malfunctioning left subclavian power port catheter.  Successful Ultrasound and fluoroscopically guided left internal jugular single lumen power port catheter insertion, utilizing the existing subcutaneous pocket. Tip in the SVC/RA junction. Catheter ready for use.   Electronically Signed   By: Daryll Brod M.D.   On: 06/01/2014 15:12   ASSESSMENT: 68 y.o. 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 noted on abdominal MRI November 2014  PANCREATIC CANCER: (3) status post endoscopic ultrasonography of the pancreas 11/23/2013 with cytology positive for adenocarcinoma, clinical stage T2 NX MX adenocarcinoma associated with a 5.5 cm pseudocyst (which was aspirated); baseline CA 19-9 was 47.0  (4) left pleural effusion status post thoracentesis 12/01/2013, cytology negative  (5) s/p laparoscopic cholecystectomy, open extended distal pancreatectomy, splenectomy, partial gastrectomy x2 12/20/2013 for a pT1 pN0, stage IA invasive ductal adenocarcinoma, grade 2, with positive resection margins  (6) adjuvant gemcitabine started 02/01/2014-- received 3 weekly doses before radiation, will reeive 9 additional doses starting 3-5 weeks after completion of radiation  (7) adjuvant radiation started 02/27/2014, with radiosensitization (capecitabine 1000 mg po BID on radiation days); completed 04/06/2014  (8) adjuvant gemcitabine weekly resumed 05/07/2014, with 9 doses planned  PLAN: Hisayo is tolerating the Gemzar well. She will begin her fourth and final cycle today. This cycle will and August 31, but she will need one more treatment, which we will do September 8. I will be right after Labor Day.  Her biggest concern at this point is her husband Larry's sarcoma. We will discuss her followup schedule at her September 8 visit  She knows to call for any problems that may develop before then.   Chauncey Cruel, MD   06/18/2014 9:37 AM

## 2014-06-18 NOTE — Patient Instructions (Signed)
[facility] Discharge Instructions for Patients Receiving Chemotherapy  Today you received the following chemotherapy agents gemzar  To help prevent nausea and vomiting after your treatment, we encourage you to take your nausea medication if needed.   If you develop nausea and vomiting that is not controlled by your nausea medication, call the clinic.   BELOW ARE SYMPTOMS THAT SHOULD BE REPORTED IMMEDIATELY:  *FEVER GREATER THAN 100.5 F  *CHILLS WITH OR WITHOUT FEVER  NAUSEA AND VOMITING THAT IS NOT CONTROLLED WITH YOUR NAUSEA MEDICATION  *UNUSUAL SHORTNESS OF BREATH  *UNUSUAL BRUISING OR BLEEDING  TENDERNESS IN MOUTH AND THROAT WITH OR WITHOUT PRESENCE OF ULCERS  *URINARY PROBLEMS  *BOWEL PROBLEMS  UNUSUAL RASH Items with * indicate a potential emergency and should be followed up as soon as possible.  Feel free to call the clinic you have any questions or concerns. The clinic phone number is [phone].

## 2014-06-18 NOTE — Telephone Encounter (Signed)
Per staff message and POF I have scheduled appts. Advised scheduler of appts. JMW  

## 2014-06-25 ENCOUNTER — Other Ambulatory Visit (HOSPITAL_BASED_OUTPATIENT_CLINIC_OR_DEPARTMENT_OTHER): Payer: Medicare Other

## 2014-06-25 ENCOUNTER — Ambulatory Visit (HOSPITAL_BASED_OUTPATIENT_CLINIC_OR_DEPARTMENT_OTHER): Payer: Medicare Other

## 2014-06-25 ENCOUNTER — Telehealth: Payer: Self-pay | Admitting: Oncology

## 2014-06-25 ENCOUNTER — Other Ambulatory Visit: Payer: Self-pay | Admitting: *Deleted

## 2014-06-25 ENCOUNTER — Ambulatory Visit (HOSPITAL_BASED_OUTPATIENT_CLINIC_OR_DEPARTMENT_OTHER): Payer: Medicare Other | Admitting: Oncology

## 2014-06-25 VITALS — BP 125/61 | HR 63 | Temp 98.8°F | Resp 18 | Ht 64.0 in | Wt 155.6 lb

## 2014-06-25 DIAGNOSIS — C251 Malignant neoplasm of body of pancreas: Secondary | ICD-10-CM

## 2014-06-25 DIAGNOSIS — C50911 Malignant neoplasm of unspecified site of right female breast: Secondary | ICD-10-CM

## 2014-06-25 DIAGNOSIS — Z5111 Encounter for antineoplastic chemotherapy: Secondary | ICD-10-CM

## 2014-06-25 DIAGNOSIS — Z853 Personal history of malignant neoplasm of breast: Secondary | ICD-10-CM

## 2014-06-25 DIAGNOSIS — I700 Atherosclerosis of aorta: Secondary | ICD-10-CM

## 2014-06-25 DIAGNOSIS — M8580 Other specified disorders of bone density and structure, unspecified site: Secondary | ICD-10-CM

## 2014-06-25 DIAGNOSIS — D735 Infarction of spleen: Secondary | ICD-10-CM

## 2014-06-25 DIAGNOSIS — C252 Malignant neoplasm of tail of pancreas: Secondary | ICD-10-CM

## 2014-06-25 DIAGNOSIS — J90 Pleural effusion, not elsewhere classified: Secondary | ICD-10-CM

## 2014-06-25 DIAGNOSIS — E440 Moderate protein-calorie malnutrition: Secondary | ICD-10-CM

## 2014-06-25 DIAGNOSIS — K52831 Collagenous colitis: Secondary | ICD-10-CM

## 2014-06-25 LAB — COMPREHENSIVE METABOLIC PANEL (CC13)
ALK PHOS: 66 U/L (ref 40–150)
ALT: 35 U/L (ref 0–55)
ANION GAP: 9 meq/L (ref 3–11)
AST: 44 U/L — ABNORMAL HIGH (ref 5–34)
Albumin: 3.4 g/dL — ABNORMAL LOW (ref 3.5–5.0)
BILIRUBIN TOTAL: 0.33 mg/dL (ref 0.20–1.20)
BUN: 16.2 mg/dL (ref 7.0–26.0)
CO2: 26 mEq/L (ref 22–29)
CREATININE: 0.6 mg/dL (ref 0.6–1.1)
Calcium: 9.2 mg/dL (ref 8.4–10.4)
Chloride: 105 mEq/L (ref 98–109)
Glucose: 116 mg/dl (ref 70–140)
Potassium: 4.4 mEq/L (ref 3.5–5.1)
Sodium: 141 mEq/L (ref 136–145)
Total Protein: 6.6 g/dL (ref 6.4–8.3)

## 2014-06-25 LAB — CBC WITH DIFFERENTIAL/PLATELET
BASO%: 3.2 % — ABNORMAL HIGH (ref 0.0–2.0)
Basophils Absolute: 0.1 10*3/uL (ref 0.0–0.1)
EOS ABS: 0.1 10*3/uL (ref 0.0–0.5)
EOS%: 1.8 % (ref 0.0–7.0)
HCT: 33 % — ABNORMAL LOW (ref 34.8–46.6)
HGB: 11.5 g/dL — ABNORMAL LOW (ref 11.6–15.9)
LYMPH%: 15.2 % (ref 14.0–49.7)
MCH: 35.6 pg — AB (ref 25.1–34.0)
MCHC: 34.8 g/dL (ref 31.5–36.0)
MCV: 102.2 fL — AB (ref 79.5–101.0)
MONO#: 0.6 10*3/uL (ref 0.1–0.9)
MONO%: 12.4 % (ref 0.0–14.0)
NEUT#: 3 10*3/uL (ref 1.5–6.5)
NEUT%: 67.4 % (ref 38.4–76.8)
NRBC: 5 % — AB (ref 0–0)
PLATELETS: 669 10*3/uL — AB (ref 145–400)
RBC: 3.23 10*6/uL — AB (ref 3.70–5.45)
RDW: 16.9 % — AB (ref 11.2–14.5)
WBC: 4.4 10*3/uL (ref 3.9–10.3)
lymph#: 0.7 10*3/uL — ABNORMAL LOW (ref 0.9–3.3)

## 2014-06-25 MED ORDER — HEPARIN SOD (PORK) LOCK FLUSH 100 UNIT/ML IV SOLN
500.0000 [IU] | Freq: Once | INTRAVENOUS | Status: AC | PRN
  Administered 2014-06-25: 500 [IU]
  Filled 2014-06-25: qty 5

## 2014-06-25 MED ORDER — PROCHLORPERAZINE MALEATE 10 MG PO TABS
10.0000 mg | ORAL_TABLET | Freq: Once | ORAL | Status: AC
  Administered 2014-06-25: 10 mg via ORAL

## 2014-06-25 MED ORDER — PROCHLORPERAZINE MALEATE 10 MG PO TABS
ORAL_TABLET | ORAL | Status: AC
  Filled 2014-06-25: qty 1

## 2014-06-25 MED ORDER — SODIUM CHLORIDE 0.9 % IJ SOLN
10.0000 mL | INTRAMUSCULAR | Status: DC | PRN
  Administered 2014-06-25: 10 mL
  Filled 2014-06-25: qty 10

## 2014-06-25 MED ORDER — SODIUM CHLORIDE 0.9 % IV SOLN
Freq: Once | INTRAVENOUS | Status: AC
  Administered 2014-06-25: 10:00:00 via INTRAVENOUS

## 2014-06-25 MED ORDER — SODIUM CHLORIDE 0.9 % IV SOLN
1000.0000 mg/m2 | Freq: Once | INTRAVENOUS | Status: AC
  Administered 2014-06-25: 1748 mg via INTRAVENOUS
  Filled 2014-06-25: qty 45.97

## 2014-06-25 NOTE — Progress Notes (Signed)
[place]  [phone] Fax:(336) [phone]     ID: Annette Beltran OB: [birth year]  MR#: [id number]  FXT#:[id number]  PCP: [name], MD GYN:   SU: [name] OTHER MD: Delfin Edis, [name]  CHIEF COMPLAINT: Early stage pancreatic cancer resected with positive margins  CURRENT TREATMENT: Adjuvant gemcitabine   PANCREATIC CANCER HISTORY: I formerly followed Annette Beltran for a stage I breast cancer, which was treated with surgery, radiation, and anti-estrogens. She was released from followup here in 2007. In addition, in 2012 she had abdominal pain leading to hospitalization. 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 The patient also has a history of collagenous colitis, which of course carries its own set of symptoms, making identification of her new attic problem more difficult.  Annette Beltran tells me she started "feeling bad" in September of 2014. There was abdominal discomfort localizing to the left upper o'clock on, worse with inspiration. CT angiography 08/21/2013 showed no clot and no significant abnormalities. Plain rib and left shoulder views were negative. CT abdomen and pelvis with contrast 08/22/2013 showed slight prominence of the pancreatic duct in the distal body and tail with a new small structure emanating from the tail of the pancreas consistent with a pseudocyst. Exudate near the tail of the pancreas suggested pancreatitis. MRI of the abdomen 09/03/2013 confirmed several ill-defined fluid collections between the gastric fundus and the splenic hilum surrounding the pancreatic tail. There was no evidence of a pancreatic mass. The pancreatic tail did enhanced following contrast. The splenic vein appeared chronically thrombosed with a small amount of  nonocclusive thrombus extending into the main and left portal veins.  On 09/26/2013 amylase was 237 and lipase 513.0, consistent with acute pancreatitis. These numbers subsequently dropped in on 11/28/2013 amylase was normal at 47 and lipase was mildly elevated at 110  With continuing left-sided abdominal discomfort, abdominal ultrasound 11/13/2013 showed multiple gallstones with no evidence of cholecystitis. Evaluation of the pancreas showed a hypoechoic lobulated focus measuring up to 7.4 cm felt to be nonspecific. Repeat abdominal CT 11/14/2013 showed diffuse pancreatic atrophy without evidence of a mass, again consistent with chronic pancreatitis. There was mild soft tissue stranding suspicious for mild superimposed acute pancreatitis. A new rim-enhancing fluid collection was noted in the gastrosplenic ligament measuring 4.5 cm, consistent with a pseudocyst. Finally on 11/23/2013, endoscopic ultrasonography under Oretha Caprice showed a subtle 2.5 cm soft tissue mass in the mid-pancreas, abutting the splenic vein. This was biopsied x2, with cytology ([id number]) showing adenocarcinoma. A peripancreatic fluid collection measuring over 5 cm was aspirated yielding 25 cc of chocolate colored fluid. Two or three scattered round hypoechoic lymph nodes were noted near the pancreatic tail but were not sampled.  In addition, on 11/28/2013 a chest x-ray obtained to evaluate upper respiratory symptoms showed a small to moderate left effusion. Chest CT scan 11/29/2013 confirmed a moderate left-sided pleural effusion with no suspicious appearing pulmonary nodules or masses associated with it. The complex cystic lesion associated with the tail of the pancreas was again noted, measuring 5.5 cm despite the recent aspiration procedure.  The patient's subsequent history is as detailed below.  INTERVAL HISTORY: Annette Beltran returns today for followup of Annette Beltran's pancreatic cancer. Today is day 8 cycle 4 of 4 planned cycles of  gemcitabine given days 1, 8, and 15  of each 21 day cycle. Day 15 cycle 3 treatment was postponed because of her work situation and will be made up at the very end. her husband [name] is receiving radiation treatments for his peritoneal sarcoma, with 27 to go  REVIEW OF SYSTEMS: Annette Beltran continues to tolerate treatment well. She and [name] take a walk every evening. She is eating more than she would like and gaining a little weight. She does have some stomach discomfort. Her colitis is entirely inactive and if anything shows a little bit of constipation. She has lost some hair but this is not terribly noticeable. She and [name] were able to go to the wedding this weekend and advanced. Sometimes she is standing and notices palpitations. She feels short of breath sits down and things go away. Aside from all this a detailed review of systems today was noncontributory Otherwise a detailed review of systems today was entirely stable  PAST MEDICAL HISTORY: Past Medical History  Diagnosis Date  . Macular degeneration   . Fluid retention   . Colitis, collagenous   . Vitamin D deficiency   . Osteopenia   . Celiac artery stenosis   . Splenic infarction   . Arthritis   . History of blood clots   . Anxiety   . Shingles   . Pancreatitis   . Osteoporosis   . Clotting disorder   . Breast cancer     rt lumpectomy  . Arrhythmia     "skips a beat"  [place]  heart  . GERD (gastroesophageal reflux disease)     PAST SURGICAL HISTORY: Past Surgical History  Procedure Laterality Date  . Foot surgery  2003    x 3, 2 on right, 1 on left  . Breast lumpectomy Right     radiation for 6 weeks  . Meniscus repair Right   . Eus N/A 11/23/2013    Procedure: UPPER ENDOSCOPIC ULTRASOUND (EUS) LINEAR;  Surgeon: Milus Banister, MD;  Location: [facility];  Service: Endoscopy;  Laterality: N/A;  . Tonsillectomy    . Laparoscopy N/A 12/20/2013    Procedure: LAPAROSCOPY DIAGNOSTIC ;  Surgeon: [name], MD;  Location:  [facility];  Service: General;  Laterality: N/A;  . Cholecystectomy N/A 12/20/2013    Procedure: LAPAROSCOPIC CHOLECYSTECTOMY;  Surgeon: [name], MD;  Location: [facility];  Service: General;  Laterality: N/A;  . Splenectomy, total N/A 12/20/2013    Procedure: SPLENECTOMY;  Surgeon: [name], MD;  Location: [facility];  Service: General;  Laterality: N/A;  . Partial gastrectomy N/A 12/20/2013    Procedure: PARTIAL GASTRECTOMY;  Surgeon: [name], MD;  Location: [facility];  Service: General;  Laterality: N/A;  . Portacath placement N/A 01/15/2014    Procedure: INSERTION PORT-A-CATH;  Surgeon: [name], MD;  Location: [facility];  Service: General;  Laterality: N/A;    FAMILY HISTORY Family History  Problem Relation Age of Onset  . Colon cancer Neg Hx   . Aneurysm Mother   . Stroke Mother   . Prostate cancer Father 78  . Skin cancer Brother 40    treated with interferon for 1 year  . Heart failure Paternal Aunt   . Breast cancer Paternal Aunt     dx over 50s  . Pancreatic cancer Maternal Grandmother     dx in her 46s  . Breast cancer Maternal Aunt     dx over 87  . Rheum arthritis Brother   . Breast cancer Paternal Aunt     dx over  36s  . Leukemia Paternal Aunt    the patient's father had a diagnosis of prostate cancer metastatic to the liver or a died at age 94. The patient's mother died secondary to carotid third brain aneurysm at the age of 42. The patient had 2 brothers, no sisters. One brother has a history of melanoma. The patient's mother is mother was diagnosed with pancreatic cancer in her 91s. There is a history of breast cancer and second degree relatives, none before the age of 64  GYNECOLOGIC HISTORY:  Menarche age 73, first live birth age 7, the patient is [place] P2.. Stopped in her early 28s. 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 Her husband of 25  years, [name], retired from [employer] and currently works part-time as a Cabin crew with [employer]. Daughter Annette Beltran teaches first grade. Daughter Annette Beltran is a homemaker. The patient has 4 grandchildren [name] has an additional 5 grandchildren of his own). The patient attends a [employer]    ADVANCED DIRECTIVES: In place   HEALTH MAINTENANCE: History  Substance Use Topics  . Smoking status: Former Smoker -- 0.50 packs/day for 28 years    Types: Cigarettes    Quit date: 11/02/2000  . Smokeless tobacco: Never Used  . Alcohol Use: Yes     Comment: occ wine      Colonoscopy:  PAP:  Bone density:  Lipid panel:  Mammography:  Allergies  Allergen Reactions  . Tape     Allergic  To  Tegaderm.  . Codeine Nausea And Vomiting  . Lactose Intolerance (Gi)   . Tequin     Severe stomach pain  . Penicillins Nausea Only and Rash    Current Outpatient Prescriptions  Medication Sig Dispense Refill  . ALPRAZolam (XANAX) 0.5 MG tablet Take 1 tablet (0.5 mg total) by mouth 2 (two) times daily as needed for anxiety.  60 tablet  5  . antiseptic oral rinse (BIOTENE) LIQD 15 mLs by Mouth Rinse route as needed for dry mouth.      . Ascorbic Acid (VITAMIN C PO) Take 1 tablet by mouth 2 (two) times daily.      Marland Kitchen aspirin EC 81 MG tablet Take 81 mg by mouth daily.      Marland Kitchen b complex vitamins tablet Take 1 tablet by mouth daily.      . Biotin 1 MG CAPS Take by mouth.      . diphenhydrAMINE (BENADRYL) 25 MG tablet Take 25 mg by mouth at bedtime.       . feeding supplement, RESOURCE BREEZE, (RESOURCE BREEZE) LIQD Take 1 Container by mouth 2 (two) times daily between meals.  30 Container  3  . furosemide (LASIX) 20 MG tablet Take 20 mg by mouth daily as needed for fluid.      Marland Kitchen lidocaine-prilocaine (EMLA) cream Apply 1-2 hours to port a cath before procedure.  30 g  6  . lipase/protease/amylase (CREON-12/PANCREASE) 12000 UNITS CPEP capsule Take 2 capsules by mouth 3 (three) times daily with meals.   270 capsule  3  . loratadine (CLARITIN) 10 MG tablet Take 10 mg by mouth daily.      . Naproxen Sodium (ALEVE) 220 MG CAPS Take 440 mg by mouth 2 (two) times daily as needed (Pain).      . ondansetron (ZOFRAN) 8 MG tablet Take 1 tablet (8 mg  total) by mouth 2 (two) times daily as needed (Nausea or vomiting).  30 tablet  1  . PARoxetine (PAXIL) 10 MG tablet Take 10 mg by mouth every morning.      Marland Kitchen PARoxetine (PAXIL) 10 MG tablet TAKE ONE TABLET BY MOUTH ONCE DAILY  30 tablet  7  . prochlorperazine (COMPAZINE) 10 MG tablet Take 1 tablet (10 mg total) by mouth every 6 (six) hours as needed (Nausea or vomiting).  30 tablet  1  . valACYclovir (VALTREX) 1000 MG tablet Take 1 tablet (1,000 mg total) by mouth 2 (two) times daily.  90 tablet  1   No current facility-administered medications for this visit.    OBJECTIVE: Middle-aged white woman in no acute distress Filed Vitals:   06/25/14 0819  BP: 125/61  Pulse: 63  Temp: 98.8 F (37.1 C)  Resp: 18     Body mass index is 26.7 kg/(m^2).    ECOG FS:1 - Symptomatic but completely ambulatory  Sclerae unicteric, pupils round and equal Oropharynx clear and moist, no thrush or other lesions No cervical or supraclavicular adenopathy Lungs no rales or rhonchi Heart regular rate and rhythm, no murmur appreciated Abd soft, nontender all quadrants, positive bowel sounds, no masses palpated MSK no focal spinal tenderness, no upper extremity lymphedema Neuro: nonfocal, well oriented, positive affect Breasts: Deferred    LAB RESULTS:  CMP     Component Value Date/Time   NA 139 06/01/2014 1155   NA 143 05/21/2014 0822   K 4.6 06/01/2014 1155   K 3.9 05/21/2014 0822   CL 100 06/01/2014 1155   CO2 27 06/01/2014 1155   CO2 25 05/21/2014 0822   GLUCOSE 101* 06/01/2014 1155   GLUCOSE 185* 05/21/2014 0822   BUN 13 06/01/2014 1155   BUN 11.1 05/21/2014 0822   CREATININE 0.59 06/01/2014 1155   CREATININE 0.7 05/21/2014 0822   CREATININE 0.62 11/28/2013 1635    CALCIUM 9.5 06/01/2014 1155   CALCIUM 9.4 05/21/2014 0822   PROT 6.8 05/21/2014 0822   PROT 5.0* 12/21/2013 0450   ALBUMIN 3.4* 05/21/2014 0822   ALBUMIN 2.3* 12/21/2013 0450   AST 48* 05/21/2014 0822   AST 42* 12/21/2013 0450   ALT 49 05/21/2014 0822   ALT 25 12/21/2013 0450   ALKPHOS 72 05/21/2014 0822   ALKPHOS 46 12/21/2013 0450   BILITOT 0.24 05/21/2014 0822   BILITOT 0.7 12/21/2013 0450   GFRNONAA >90 06/01/2014 1155   GFRAA >90 06/01/2014 1155    I No results found for this basename: SPEP,  UPEP,   kappa and lambda light chains    Lab Results  Component Value Date   WBC 5.3 06/18/2014   NEUTROABS 3.0 06/18/2014   HGB 11.7 06/18/2014   HCT 33.6* 06/18/2014   MCV 101.5* 06/18/2014   PLT 828* 06/18/2014      Chemistry      Component Value Date/Time   NA 139 06/01/2014 1155   NA 143 05/21/2014 0822   K 4.6 06/01/2014 1155   K 3.9 05/21/2014 0822   CL 100 06/01/2014 1155   CO2 27 06/01/2014 1155   CO2 25 05/21/2014 0822   BUN 13 06/01/2014 1155   BUN 11.1 05/21/2014 0822   CREATININE 0.59 06/01/2014 1155   CREATININE 0.7 05/21/2014 0822   CREATININE 0.62 11/28/2013 1635      Component Value Date/Time   CALCIUM 9.5 06/01/2014 1155   CALCIUM 9.4 05/21/2014 0822   ALKPHOS 72 05/21/2014 0822   ALKPHOS 46  12/21/2013 0450   AST 48* 05/21/2014 0822   AST 42* 12/21/2013 0450   ALT 49 05/21/2014 0822   ALT 25 12/21/2013 0450   BILITOT 0.24 05/21/2014 0822   BILITOT 0.7 12/21/2013 0450     Results for DONTA, MCINROY (MRN [id number]) as of 06/18/2014 09:36  Ref. Range 05/07/2014 08:13 05/14/2014 08:21 05/21/2014 08:22 05/28/2014 09:23 06/04/2014 08:14  CA 19-9 Latest Range: <35.0 U/mL 30.5 28.8 23.9 21.0 25.1    Lab Results  Component Value Date   LABCA2 8 06/08/2011    No components found with this basename: [id number]    No results found for this basename: INR,  in the last 168 hours  Urinalysis    Component Value Date/Time   COLORURINE YELLOW 06/07/2011 1942   APPEARANCEUR CLEAR 06/07/2011 1942    LABSPEC 1.010 02/15/2014 1109   LABSPEC 1.009 06/07/2011 1942   PHURINE 6.0 06/07/2011 1942   GLUCOSEU Negative 02/15/2014 [place] 06/07/2011 1942   HGBUR NEGATIVE 06/07/2011 1942   BILIRUBINUR neg 12/25/2011 [place] 06/07/2011 1942   KETONESUR NEGATIVE 06/07/2011 1942   PROTEINUR neg 12/25/2011 1216   PROTEINUR NEGATIVE 06/07/2011 1942   UROBILINOGEN 0.2 02/15/2014 1109   UROBILINOGEN negative 12/25/2011 1216   UROBILINOGEN 0.2 06/07/2011 1942   NITRITE neg 12/25/2011 1216   NITRITE NEGATIVE 06/07/2011 1942   LEUKOCYTESUR Negative 12/25/2011 1216    STUDIES: Ir Fluoro Guide Cv Line Right  06/01/2014   CLINICAL DATA:  Pancreas cancer, prior history of breast cancer, access for chemotherapy, malfunctioning left subclavian power port  EXAM: ULTRASOUND GUIDANCE FOR VASCULAR ACCESS  REMOVAL OF THE LEFT SUBCLAVIAN PORT CATHETER  ULTRASOUND AND FLUOROSCOPIC INSERTION OF A NEW LEFT IJ POWER PORT CATHETER PRESERVING THE EXISTING PORT CATHETER SUBCUTANEOUS POCKET.  Date:  7/31/20157/31/2015 2:46 pm  Radiologist:  M. Daryll Brod, MD  Guidance:  Ultrasound and fluoroscopic  FLUOROSCOPY TIME:  1 min 12 seconds  MEDICATIONS AND MEDICAL HISTORY: 1 g vancomycinadministered within 1 hour of the procedure.4 mg Versed said, 100 mcg fentanyl  ANESTHESIA/SEDATION: 45 min  CONTRAST:  None.  COMPLICATIONS: No immediate  PROCEDURE: Informed consent was obtained from the patient following explanation of the procedure, risks, benefits and alternatives. The patient understands, agrees and consents for the procedure. All questions were addressed. A time out was performed.  Maximal barrier sterile technique utilized including caps, mask, sterile gowns, sterile gloves, large sterile drape, hand hygiene, and 2% chlorhexidine scrub.  Left subclavian port catheter removal: Initially under sterile conditions and local anesthesia, an incision was made along the existing left subclavian port catheter site. Utilizing  sharp and blunt dissection, the left subclavian port catheter was removed entirely. Port catheter pocket was flushed with saline. There was adequate hemostasis. No signs of infection. Port catheter pocket will be preserved for a new left IJ power port.  Under sterile conditions and local anesthesia, left internal jugular micropuncture venous access was performed. Access was performed with ultrasound. Images were obtained for documentation. A guide wire was inserted followed by a transitional dilator. This allowed insertion of a guide wire and catheter into the IVC. Measurements were obtained from the SVC / RA junction back to the left IJ venotomy site. In the left infraclavicular chest, the existing subcutaneous pocket was preserved. The port catheter was assembled and checked for leakage. The port catheter was secured in the pocket with two retention sutures. The tubing was tunneled subcutaneously to the left IJ venotomy site and inserted  into the SVC/RA junction through a valved peel-away sheath. Position was confirmed with fluoroscopy. Images were obtained for documentation. The patient tolerated the procedure well. No immediate complications. Incisions were closed in a two layer fashion with 4 - 0 Vicryl suture. Dermabond was applied to the skin. The port catheter was accessed, blood was aspirated followed by saline and heparin flushes. Needle was removed. A dry sterile dressing was applied.  IMPRESSION: Successful removal of the malfunctioning left subclavian power port catheter.  Successful Ultrasound and fluoroscopically guided left internal jugular single lumen power port catheter insertion, utilizing the existing subcutaneous pocket. Tip in the SVC/RA junction. Catheter ready for use.   Electronically Signed   By: Daryll Brod M.D.   On: 06/01/2014 15:12   Ir Removal Tun Access W/ Port W/o Fl Mod Sed  06/01/2014   CLINICAL DATA:  Pancreas cancer, prior history of breast cancer, access for chemotherapy,  malfunctioning left subclavian power port  EXAM: ULTRASOUND GUIDANCE FOR VASCULAR ACCESS  REMOVAL OF THE LEFT SUBCLAVIAN PORT CATHETER  ULTRASOUND AND FLUOROSCOPIC INSERTION OF A NEW LEFT IJ POWER PORT CATHETER PRESERVING THE EXISTING PORT CATHETER SUBCUTANEOUS POCKET.  Date:  7/31/20157/31/2015 2:46 pm  Radiologist:  M. Daryll Brod, MD  Guidance:  Ultrasound and fluoroscopic  FLUOROSCOPY TIME:  1 min 12 seconds  MEDICATIONS AND MEDICAL HISTORY: 1 g vancomycinadministered within 1 hour of the procedure.4 mg Versed said, 100 mcg fentanyl  ANESTHESIA/SEDATION: 45 min  CONTRAST:  None.  COMPLICATIONS: No immediate  PROCEDURE: Informed consent was obtained from the patient following explanation of the procedure, risks, benefits and alternatives. The patient understands, agrees and consents for the procedure. All questions were addressed. A time out was performed.  Maximal barrier sterile technique utilized including caps, mask, sterile gowns, sterile gloves, large sterile drape, hand hygiene, and 2% chlorhexidine scrub.  Left subclavian port catheter removal: Initially under sterile conditions and local anesthesia, an incision was made along the existing left subclavian port catheter site. Utilizing sharp and blunt dissection, the left subclavian port catheter was removed entirely. Port catheter pocket was flushed with saline. There was adequate hemostasis. No signs of infection. Port catheter pocket will be preserved for a new left IJ power port.  Under sterile conditions and local anesthesia, left internal jugular micropuncture venous access was performed. Access was performed with ultrasound. Images were obtained for documentation. A guide wire was inserted followed by a transitional dilator. This allowed insertion of a guide wire and catheter into the IVC. Measurements were obtained from the SVC / RA junction back to the left IJ venotomy site. In the left infraclavicular chest, the existing subcutaneous pocket was  preserved. The port catheter was assembled and checked for leakage. The port catheter was secured in the pocket with two retention sutures. The tubing was tunneled subcutaneously to the left IJ venotomy site and inserted into the SVC/RA junction through a valved peel-away sheath. Position was confirmed with fluoroscopy. Images were obtained for documentation. The patient tolerated the procedure well. No immediate complications. Incisions were closed in a two layer fashion with 4 - 0 Vicryl suture. Dermabond was applied to the skin. The port catheter was accessed, blood was aspirated followed by saline and heparin flushes. Needle was removed. A dry sterile dressing was applied.  IMPRESSION: Successful removal of the malfunctioning left subclavian power port catheter.  Successful Ultrasound and fluoroscopically guided left internal jugular single lumen power port catheter insertion, utilizing the existing subcutaneous pocket. Tip in the SVC/RA junction. Catheter ready  for use.   Electronically Signed   By: Daryll Brod M.D.   On: 06/01/2014 15:12   Ir Cv Line Injection  05/28/2014   CLINICAL DATA:  Pancreatic neoplasm, needs access for chemotherapy. Port catheter won't aspirate.  EXAM: PORT CATHETER INJECTION UNDER FLUOROSCOPY  TECHNIQUE: The procedure, risks (including but not limited to bleeding, infection, organ damage ), benefits, and alternatives were explained to the patient. Questions regarding the procedure were encouraged and answered. The patient understands and consents to the procedure.  Survey fluoroscopic inspection reveals that the subcutaneous portion of the catheter tubing is looped back in the subcutaneous tissues since initial placement, and the tip is now in the innominate vein. The port body projects lateral to the thoracic cage overlying the axilla.  A port access needle was placed by radiology RN, challenging due to the deep position of the port and inability to aspirate. Contrast injection  initially demonstrated needle tip placement external to the port. The needle was repositioned. Injection demonstrates patency of the port reservoir and tubing. The innominate vein and SVC are widely patent. No extravasation.  IMPRESSION: 1. The port catheter tubing has pulled back, tip in the innominate vein. The reservoir is laterally placed and deep, difficult to access. The port was left accessed, and can continue to be used in this position. If long-term access needs are anticipated, consider port revision and repositioning of both the tip of the port catheter and port body.   Electronically Signed   By: Arne Cleveland M.D.   On: 05/28/2014 09:16   Ir US Guide Vasc Access Right  06/01/2014   CLINICAL DATA:  Pancreas cancer, prior history of breast cancer, access for chemotherapy, malfunctioning left subclavian power port  EXAM: ULTRASOUND GUIDANCE FOR VASCULAR ACCESS  REMOVAL OF THE LEFT SUBCLAVIAN PORT CATHETER  ULTRASOUND AND FLUOROSCOPIC INSERTION OF A NEW LEFT IJ POWER PORT CATHETER PRESERVING THE EXISTING PORT CATHETER SUBCUTANEOUS POCKET.  Date:  7/31/20157/31/2015 2:46 pm  Radiologist:  M. Daryll Brod, MD  Guidance:  Ultrasound and fluoroscopic  FLUOROSCOPY TIME:  1 min 12 seconds  MEDICATIONS AND MEDICAL HISTORY: 1 g vancomycinadministered within 1 hour of the procedure.4 mg Versed said, 100 mcg fentanyl  ANESTHESIA/SEDATION: 45 min  CONTRAST:  None.  COMPLICATIONS: No immediate  PROCEDURE: Informed consent was obtained from the patient following explanation of the procedure, risks, benefits and alternatives. The patient understands, agrees and consents for the procedure. All questions were addressed. A time out was performed.  Maximal barrier sterile technique utilized including caps, mask, sterile gowns, sterile gloves, large sterile drape, hand hygiene, and 2% chlorhexidine scrub.  Left subclavian port catheter removal: Initially under sterile conditions and local anesthesia, an incision was made  along the existing left subclavian port catheter site. Utilizing sharp and blunt dissection, the left subclavian port catheter was removed entirely. Port catheter pocket was flushed with saline. There was adequate hemostasis. No signs of infection. Port catheter pocket will be preserved for a new left IJ power port.  Under sterile conditions and local anesthesia, left internal jugular micropuncture venous access was performed. Access was performed with ultrasound. Images were obtained for documentation. A guide wire was inserted followed by a transitional dilator. This allowed insertion of a guide wire and catheter into the IVC. Measurements were obtained from the SVC / RA junction back to the left IJ venotomy site. In the left infraclavicular chest, the existing subcutaneous pocket was preserved. The port catheter was assembled and checked for leakage. The port  catheter was secured in the pocket with two retention sutures. The tubing was tunneled subcutaneously to the left IJ venotomy site and inserted into the SVC/RA junction through a valved peel-away sheath. Position was confirmed with fluoroscopy. Images were obtained for documentation. The patient tolerated the procedure well. No immediate complications. Incisions were closed in a two layer fashion with 4 - 0 Vicryl suture. Dermabond was applied to the skin. The port catheter was accessed, blood was aspirated followed by saline and heparin flushes. Needle was removed. A dry sterile dressing was applied.  IMPRESSION: Successful removal of the malfunctioning left subclavian power port catheter.  Successful Ultrasound and fluoroscopically guided left internal jugular single lumen power port catheter insertion, utilizing the existing subcutaneous pocket. Tip in the SVC/RA junction. Catheter ready for use.   Electronically Signed   By: Daryll Brod M.D.   On: 06/01/2014 15:12   ASSESSMENT: 68 y.o. 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 noted on abdominal MRI November 2014  PANCREATIC CANCER: (3) status post endoscopic ultrasonography of the pancreas 11/23/2013 with cytology positive for adenocarcinoma, clinical stage T2 NX MX adenocarcinoma associated with a 5.5 cm pseudocyst (which was aspirated); baseline CA 19-9 was 47.0  (4) left pleural effusion status post thoracentesis 12/01/2013, cytology negative  (5) s/p laparoscopic cholecystectomy, open extended distal pancreatectomy, splenectomy, partial gastrectomy x2 12/20/2013 for a pT1 pN0, stage IA invasive ductal adenocarcinoma, grade 2, with positive resection margins  (6) adjuvant gemcitabine started 02/01/2014-- received 3 weekly doses before radiation, will reeive 9 additional doses starting 3-5 weeks after completion of radiation  (7) adjuvant radiation started 02/27/2014, with radiosensitization (capecitabine 1000 mg po BID on radiation days); completed 04/06/2014  (8) adjuvant gemcitabine weekly resumed 05/07/2014, with 9 doses planned  PLAN: Rubi is well with the Gemzar and we are proceeding to treatment today. She did not have an appointment with me next week and I have had to double bulk her, as schedule was already full. She understands this may involve some delays. After today's treatment she only has 2 more, which is very favorable. We will  then be repeating a PET scan in about 2 months, which will be our new baseline study.  I have suggested she take ondansetron tonight and tomorrow morning since she did have some nausea with the last treatment. Otherwise I am very pleased with her tolerance of therapy so far. She knows to call for any problems that may develop before her next visit here.   Chauncey Cruel, MD   06/25/2014 8:21 AM

## 2014-06-25 NOTE — Patient Instructions (Signed)
[facility] Discharge Instructions for Patients Receiving Chemotherapy  Today you received the following chemotherapy agents Gemzar.  To help prevent nausea and vomiting after your treatment, we encourage you to take your nausea medication as prescribed.   If you develop nausea and vomiting that is not controlled by your nausea medication, call the clinic.   BELOW ARE SYMPTOMS THAT SHOULD BE REPORTED IMMEDIATELY:  *FEVER GREATER THAN 100.5 F  *CHILLS WITH OR WITHOUT FEVER  NAUSEA AND VOMITING THAT IS NOT CONTROLLED WITH YOUR NAUSEA MEDICATION  *UNUSUAL SHORTNESS OF BREATH  *UNUSUAL BRUISING OR BLEEDING  TENDERNESS IN MOUTH AND THROAT WITH OR WITHOUT PRESENCE OF ULCERS  *URINARY PROBLEMS  *BOWEL PROBLEMS  UNUSUAL RASH Items with * indicate a potential emergency and should be followed up as soon as possible.  Feel free to call the clinic you have any questions or concerns. The clinic phone number is [phone].    

## 2014-06-25 NOTE — Progress Notes (Signed)
Per Dr. Jana Hakim, okay to proceed with treatment today before CMET results come back. Cindi Carbon, RN

## 2014-06-25 NOTE — Telephone Encounter (Signed)
, °

## 2014-07-02 ENCOUNTER — Other Ambulatory Visit (HOSPITAL_BASED_OUTPATIENT_CLINIC_OR_DEPARTMENT_OTHER): Payer: Medicare Other

## 2014-07-02 ENCOUNTER — Other Ambulatory Visit: Payer: Medicare Other

## 2014-07-02 ENCOUNTER — Ambulatory Visit (HOSPITAL_BASED_OUTPATIENT_CLINIC_OR_DEPARTMENT_OTHER): Payer: Medicare Other

## 2014-07-02 ENCOUNTER — Other Ambulatory Visit: Payer: BC Managed Care – PPO

## 2014-07-02 ENCOUNTER — Telehealth: Payer: Self-pay | Admitting: Oncology

## 2014-07-02 ENCOUNTER — Ambulatory Visit (HOSPITAL_BASED_OUTPATIENT_CLINIC_OR_DEPARTMENT_OTHER): Payer: Medicare Other | Admitting: Oncology

## 2014-07-02 VITALS — BP 128/54 | HR 58 | Temp 98.1°F | Resp 18 | Ht 64.0 in | Wt 159.3 lb

## 2014-07-02 DIAGNOSIS — C252 Malignant neoplasm of tail of pancreas: Secondary | ICD-10-CM

## 2014-07-02 DIAGNOSIS — I700 Atherosclerosis of aorta: Secondary | ICD-10-CM

## 2014-07-02 DIAGNOSIS — Z853 Personal history of malignant neoplasm of breast: Secondary | ICD-10-CM

## 2014-07-02 DIAGNOSIS — C259 Malignant neoplasm of pancreas, unspecified: Secondary | ICD-10-CM

## 2014-07-02 DIAGNOSIS — C251 Malignant neoplasm of body of pancreas: Secondary | ICD-10-CM

## 2014-07-02 DIAGNOSIS — D7389 Other diseases of spleen: Secondary | ICD-10-CM

## 2014-07-02 DIAGNOSIS — M8580 Other specified disorders of bone density and structure, unspecified site: Secondary | ICD-10-CM

## 2014-07-02 DIAGNOSIS — C50911 Malignant neoplasm of unspecified site of right female breast: Secondary | ICD-10-CM

## 2014-07-02 DIAGNOSIS — K52831 Collagenous colitis: Secondary | ICD-10-CM

## 2014-07-02 DIAGNOSIS — D735 Infarction of spleen: Secondary | ICD-10-CM

## 2014-07-02 DIAGNOSIS — E440 Moderate protein-calorie malnutrition: Secondary | ICD-10-CM

## 2014-07-02 DIAGNOSIS — Z5111 Encounter for antineoplastic chemotherapy: Secondary | ICD-10-CM

## 2014-07-02 LAB — CBC WITH DIFFERENTIAL/PLATELET
BASO%: 3.7 % — AB (ref 0.0–2.0)
Basophils Absolute: 0.2 10*3/uL — ABNORMAL HIGH (ref 0.0–0.1)
EOS%: 2 % (ref 0.0–7.0)
Eosinophils Absolute: 0.1 10*3/uL (ref 0.0–0.5)
HCT: 33 % — ABNORMAL LOW (ref 34.8–46.6)
HGB: 11.4 g/dL — ABNORMAL LOW (ref 11.6–15.9)
LYMPH%: 10.8 % — AB (ref 14.0–49.7)
MCH: 35.5 pg — ABNORMAL HIGH (ref 25.1–34.0)
MCHC: 34.5 g/dL (ref 31.5–36.0)
MCV: 102.8 fL — AB (ref 79.5–101.0)
MONO#: 0.9 10*3/uL (ref 0.1–0.9)
MONO%: 19.1 % — AB (ref 0.0–14.0)
NEUT#: 3 10*3/uL (ref 1.5–6.5)
NEUT%: 64.4 % (ref 38.4–76.8)
PLATELETS: 290 10*3/uL (ref 145–400)
RBC: 3.21 10*6/uL — AB (ref 3.70–5.45)
RDW: 17.3 % — ABNORMAL HIGH (ref 11.2–14.5)
WBC: 4.6 10*3/uL (ref 3.9–10.3)
lymph#: 0.5 10*3/uL — ABNORMAL LOW (ref 0.9–3.3)
nRBC: 6 % — ABNORMAL HIGH (ref 0–0)

## 2014-07-02 LAB — COMPREHENSIVE METABOLIC PANEL (CC13)
ALBUMIN: 3.4 g/dL — AB (ref 3.5–5.0)
ALK PHOS: 74 U/L (ref 40–150)
ALT: 33 U/L (ref 0–55)
AST: 31 U/L (ref 5–34)
Anion Gap: 8 mEq/L (ref 3–11)
BILIRUBIN TOTAL: 0.37 mg/dL (ref 0.20–1.20)
BUN: 15.7 mg/dL (ref 7.0–26.0)
CALCIUM: 8.9 mg/dL (ref 8.4–10.4)
CO2: 28 mEq/L (ref 22–29)
CREATININE: 0.8 mg/dL (ref 0.6–1.1)
Chloride: 103 mEq/L (ref 98–109)
Glucose: 172 mg/dl — ABNORMAL HIGH (ref 70–140)
Potassium: 4.5 mEq/L (ref 3.5–5.1)
Sodium: 139 mEq/L (ref 136–145)
Total Protein: 6.6 g/dL (ref 6.4–8.3)

## 2014-07-02 MED ORDER — PROCHLORPERAZINE MALEATE 10 MG PO TABS
10.0000 mg | ORAL_TABLET | Freq: Once | ORAL | Status: AC
  Administered 2014-07-02: 10 mg via ORAL

## 2014-07-02 MED ORDER — HEPARIN SOD (PORK) LOCK FLUSH 100 UNIT/ML IV SOLN
500.0000 [IU] | Freq: Once | INTRAVENOUS | Status: AC | PRN
  Administered 2014-07-02: 500 [IU]
  Filled 2014-07-02: qty 5

## 2014-07-02 MED ORDER — SODIUM CHLORIDE 0.9 % IV SOLN
1000.0000 mg/m2 | Freq: Once | INTRAVENOUS | Status: AC
  Administered 2014-07-02: 1748 mg via INTRAVENOUS
  Filled 2014-07-02: qty 45.97

## 2014-07-02 MED ORDER — PROCHLORPERAZINE MALEATE 10 MG PO TABS
ORAL_TABLET | ORAL | Status: AC
  Filled 2014-07-02: qty 1

## 2014-07-02 MED ORDER — SODIUM CHLORIDE 0.9 % IJ SOLN
10.0000 mL | INTRAMUSCULAR | Status: DC | PRN
  Administered 2014-07-02: 10 mL
  Filled 2014-07-02: qty 10

## 2014-07-02 MED ORDER — SODIUM CHLORIDE 0.9 % IV SOLN
Freq: Once | INTRAVENOUS | Status: AC
  Administered 2014-07-02: 10:00:00 via INTRAVENOUS

## 2014-07-02 NOTE — Progress Notes (Signed)
[place]  [phone] [phone]     ID: Annette Beltran OB: October 07, 1946  MR#: [id number]  LSL#:[id number]  PCP: [name], MD GYN:   SU: [name] OTHER MD: Delfin Edis, [name]  CHIEF COMPLAINT: Early stage pancreatic cancer resected with positive margins  CURRENT TREATMENT: Adjuvant gemcitabine   PANCREATIC CANCER HISTORY: I formerly followed Annette Beltran for a stage I breast cancer, which was treated with surgery, radiation, and anti-estrogens. She was released from followup here in 2007. In addition, in 2012 she had abdominal pain leading to hospitalization. 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 The patient also has a history of collagenous colitis, which of course carries its own set of symptoms, making identification of her new attic problem more difficult.  Annette Beltran tells me she started "feeling bad" in September of 2014. There was abdominal discomfort localizing to the left upper o'clock on, worse with inspiration. CT angiography 08/21/2013 showed no clot and no significant abnormalities. Plain rib and left shoulder views were negative. CT abdomen and pelvis with contrast 08/22/2013 showed slight prominence of the pancreatic duct in the distal body and tail with a new small structure emanating from the tail of the pancreas consistent with a pseudocyst. Exudate near the tail of the pancreas suggested pancreatitis. MRI of the abdomen 09/03/2013 confirmed several ill-defined fluid collections between the gastric fundus and the splenic hilum surrounding the pancreatic tail. There was no evidence of a pancreatic mass. The pancreatic tail did enhanced following contrast. The splenic vein appeared chronically thrombosed with a small amount of  nonocclusive thrombus extending into the main and left portal veins.  On 09/26/2013 amylase was 237 and lipase 513.0, consistent with acute pancreatitis. These numbers subsequently dropped in on 11/28/2013 amylase was normal at 47 and lipase was mildly elevated at 110  With continuing left-sided abdominal discomfort, abdominal ultrasound 11/13/2013 showed multiple gallstones with no evidence of cholecystitis. Evaluation of the pancreas showed a hypoechoic lobulated focus measuring up to 7.4 cm felt to be nonspecific. Repeat abdominal CT 11/14/2013 showed diffuse pancreatic atrophy without evidence of a mass, again consistent with chronic pancreatitis. There was mild soft tissue stranding suspicious for mild superimposed acute pancreatitis. A new rim-enhancing fluid collection was noted in the gastrosplenic ligament measuring 4.5 cm, consistent with a pseudocyst. Finally on 11/23/2013, endoscopic ultrasonography under Oretha Caprice showed a subtle 2.5 cm soft tissue mass in the mid-pancreas, abutting the splenic vein. This was biopsied x2, with cytology ([id number]) showing adenocarcinoma. A peripancreatic fluid collection measuring over 5 cm was aspirated yielding 25 cc of chocolate colored fluid. Two or three scattered round hypoechoic lymph nodes were noted near the pancreatic tail but were not sampled.  In addition, on 11/28/2013 a chest x-ray obtained to evaluate upper respiratory symptoms showed a small to moderate left effusion. Chest CT scan 11/29/2013 confirmed a moderate left-sided pleural effusion with no suspicious appearing pulmonary nodules or masses associated with it. The complex cystic lesion associated with the tail of the pancreas was again noted, measuring 5.5 cm despite the recent aspiration procedure.  The patient's subsequent history is as detailed below.  INTERVAL HISTORY: Annette Beltran returns today for followup of Annette Beltran's pancreatic cancer accompanied by her husband Annette Beltran. Today is day 15  cycle 4 of 4 planned cycles of gemcitabine given  days 1, 8, and 15 of each 21 day cycle. Day 15 cycle 3 treatment was postponed because of her work situation and will be made up next week.   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 He is having severe rectal pain and hemorrhoidal pain. He is doing sitz baths, Tucks pads, hydrocortisone cream, and Anusol suppositories. He takes a Ambulance person at night. He is very irritable as a result of this. I suggested he add MiraLAX, stool softeners, drink 1-1/2-2 quarts of liquid a day, and consider instead of Percocet, which may constipate him, using Aleve 3 times a day with food. She will continue to troubleshoot this problem with Dr. Tammi Klippel in radiation oncology.]  REVIEW OF SYSTEMS: Annette Beltran is concerned that she has gained a few pounds. She is not exercising regularly because she is fatigued. She has lost a little bit of hair. This is not really very noticeable, but she is worried that it might get worse. She remains constipated and is herself using some stool softeners. Whenever she turns her head suddenly she gets a little bit dizzy. The vertigo-like feelings only last a few seconds. She doesn't have any sinus symptoms. She is already on 2 antihistamines today. A detailed review of systems today was otherwise stable  PAST MEDICAL HISTORY: Past Medical History  Diagnosis Date  . Macular degeneration   . Fluid retention   . Colitis, collagenous   . Vitamin D deficiency   . Osteopenia   . Celiac artery stenosis   . Splenic infarction   . Arthritis   . History of blood clots   . Anxiety   . Shingles   . Pancreatitis   . Osteoporosis   . Clotting disorder   . Breast cancer     rt lumpectomy  . Arrhythmia     "skips a beat"  [place]  heart  . GERD (gastroesophageal reflux disease)     PAST SURGICAL HISTORY: Past Surgical History  Procedure Laterality Date  . Foot surgery  2003    x 3, 2 on right, 1 on left  . Breast  lumpectomy Right     radiation for 6 weeks  . Meniscus repair Right   . Eus N/A 11/23/2013    Procedure: UPPER ENDOSCOPIC ULTRASOUND (EUS) LINEAR;  Surgeon: Milus Banister, MD;  Location: [facility];  Service: Endoscopy;  Laterality: N/A;  . Tonsillectomy    . Laparoscopy N/A 12/20/2013    Procedure: LAPAROSCOPY DIAGNOSTIC ;  Surgeon: [name], MD;  Location: [place];  Service: General;  Laterality: N/A;  . Cholecystectomy N/A 12/20/2013    Procedure: LAPAROSCOPIC CHOLECYSTECTOMY;  Surgeon: [name], MD;  Location: [place];  Service: General;  Laterality: N/A;  . Splenectomy, total N/A 12/20/2013    Procedure: SPLENECTOMY;  Surgeon: [name], MD;  Location: [place];  Service: General;  Laterality: N/A;  . Partial gastrectomy N/A 12/20/2013    Procedure: PARTIAL GASTRECTOMY;  Surgeon: [name], MD;  Location: [place];  Service: General;  Laterality: N/A;  . Portacath placement N/A 01/15/2014    Procedure: INSERTION PORT-A-CATH;  Surgeon: [name], MD;  Location: [facility];  Service: General;  Laterality: N/A;    FAMILY HISTORY Family History  Problem Relation Age of Onset  . Colon cancer Neg Hx   . Aneurysm Mother   . Stroke Mother   . Prostate cancer Father 58  . Skin cancer Brother 52    treated with interferon for 1 year  . Heart failure  Paternal Aunt   . Breast cancer Paternal Aunt     dx over 44s  . Pancreatic cancer Maternal Grandmother     dx in her 7s  . Breast cancer Maternal Aunt     dx over 33  . Rheum arthritis Brother   . Breast cancer Paternal Aunt     dx over 3s  . Leukemia Paternal Aunt    the patient's father had a diagnosis of prostate cancer metastatic to the liver or a died at age 34. The patient's mother died secondary to carotid third brain aneurysm at the age of 12. The patient had 2 brothers, no sisters. One brother has a history of melanoma. The patient's mother is mother was diagnosed with pancreatic cancer in her 73s. There is a history of  breast cancer and second degree relatives, none before the age of 69  GYNECOLOGIC HISTORY:  Menarche age 69, first live birth age 41, the patient is [place] P2.. Stopped in her early 54s. 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 Her husband of 70 years, Annette Beltran, retired from [employer] and currently works part-time as a Cabin crew with [employer]. Daughter Annette Beltran teaches first grade. Daughter Annette Beltran is a homemaker. The patient has 4 grandchildren Annette Beltran has an additional 5 grandchildren of his own). The patient attends a [employer]    ADVANCED DIRECTIVES: In place   HEALTH MAINTENANCE: History  Substance Use Topics  . Smoking status: Former Smoker -- 0.50 packs/day for 28 years    Types: Cigarettes    Quit date: 11/02/2000  . Smokeless tobacco: Never Used  . Alcohol Use: Yes     Comment: occ wine      Colonoscopy:  PAP:  Bone density:  Lipid panel:  Mammography:  Allergies  Allergen Reactions  . Tape     Allergic  To  Tegaderm.  . Codeine Nausea And Vomiting  . Lactose Intolerance (Gi)   . Tequin     Severe stomach pain  . Penicillins Nausea Only and Rash    Current Outpatient Prescriptions  Medication Sig Dispense Refill  . ALPRAZolam (XANAX) 0.5 MG tablet Take 1 tablet (0.5 mg total) by mouth 2 (two) times daily as needed for anxiety.  60 tablet  5  . antiseptic oral rinse (BIOTENE) LIQD 15 mLs by Mouth Rinse route as needed for dry mouth.      . Ascorbic Acid (VITAMIN C PO) Take 1 tablet by mouth 2 (two) times daily.      Marland Kitchen aspirin EC 81 MG tablet Take 81 mg by mouth daily.      Marland Kitchen b complex vitamins tablet Take 1 tablet by mouth daily.      . Biotin 1 MG CAPS Take by mouth.      . diphenhydrAMINE (BENADRYL) 25 MG tablet Take 25 mg by mouth at bedtime.       . feeding supplement, RESOURCE BREEZE, (RESOURCE BREEZE) LIQD Take 1 Container  by mouth 2 (two) times daily between meals.  30 Container  3  . furosemide (LASIX) 20 MG tablet Take 20 mg by mouth daily as needed for fluid.      Marland Kitchen lidocaine-prilocaine (EMLA) cream Apply 1-2 hours to port a cath before procedure.  30 g  6  . lipase/protease/amylase (CREON-12/PANCREASE) 12000 UNITS CPEP capsule Take 2 capsules by mouth 3 (three) times  daily with meals.  270 capsule  3  . loratadine (CLARITIN) 10 MG tablet Take 10 mg by mouth daily.      . Naproxen Sodium (ALEVE) 220 MG CAPS Take 440 mg by mouth 2 (two) times daily as needed (Pain).      . ondansetron (ZOFRAN) 8 MG tablet Take 1 tablet (8 mg total) by mouth 2 (two) times daily as needed (Nausea or vomiting).  30 tablet  1  . PARoxetine (PAXIL) 10 MG tablet Take 10 mg by mouth every morning.      Marland Kitchen PARoxetine (PAXIL) 10 MG tablet TAKE ONE TABLET BY MOUTH ONCE DAILY  30 tablet  7  . prochlorperazine (COMPAZINE) 10 MG tablet Take 1 tablet (10 mg total) by mouth every 6 (six) hours as needed (Nausea or vomiting).  30 tablet  1  . valACYclovir (VALTREX) 1000 MG tablet Take 1 tablet (1,000 mg total) by mouth 2 (two) times daily.  90 tablet  1   No current facility-administered medications for this visit.    OBJECTIVE: Middle-aged white woman who appears stated age 68 Vitals:   07/02/14 0844  BP: 128/54  Pulse: 58  Temp: 98.1 F (36.7 C)  Resp: 18     Body mass index is 27.33 kg/(m^2).    ECOG FS:1 - Symptomatic but completely ambulatory  Sclerae unicteric, EOMs intact Oropharynx clear and moist No cervical or supraclavicular adenopathy Lungs no rales or rhonchi Heart regular rate and rhythm Abd soft, nontender all quadrants, positive bowel sounds MSK no focal spinal tenderness, no upper extremity lymphedema Neuro: nonfocal, well oriented, appropriate affect Breasts: Deferred    LAB RESULTS:  CMP     Component Value Date/Time   NA 141 06/25/2014 0943   NA 139 06/01/2014 1155   K 4.4 06/25/2014 0943   K 4.6  06/01/2014 1155   CL 100 06/01/2014 1155   CO2 26 06/25/2014 0943   CO2 27 06/01/2014 1155   GLUCOSE 116 06/25/2014 0943   GLUCOSE 101* 06/01/2014 1155   BUN 16.2 06/25/2014 0943   BUN 13 06/01/2014 1155   CREATININE 0.6 06/25/2014 0943   CREATININE 0.59 06/01/2014 1155   CREATININE 0.62 11/28/2013 1635   CALCIUM 9.2 06/25/2014 0943   CALCIUM 9.5 06/01/2014 1155   PROT 6.6 06/25/2014 0943   PROT 5.0* 12/21/2013 0450   ALBUMIN 3.4* 06/25/2014 0943   ALBUMIN 2.3* 12/21/2013 0450   AST 44* 06/25/2014 0943   AST 42* 12/21/2013 0450   ALT 35 06/25/2014 0943   ALT 25 12/21/2013 0450   ALKPHOS 66 06/25/2014 0943   ALKPHOS 46 12/21/2013 0450   BILITOT 0.33 06/25/2014 0943   BILITOT 0.7 12/21/2013 0450   GFRNONAA >90 06/01/2014 1155   GFRAA >90 06/01/2014 1155    I No results found for this basename: SPEP,  UPEP,   kappa and lambda light chains    Lab Results  Component Value Date   WBC 4.6 07/02/2014   NEUTROABS 3.0 07/02/2014   HGB 11.4* 07/02/2014   HCT 33.0* 07/02/2014   MCV 102.8* 07/02/2014   PLT 290 07/02/2014      Chemistry      Component Value Date/Time   NA 141 06/25/2014 0943   NA 139 06/01/2014 1155   K 4.4 06/25/2014 0943   K 4.6 06/01/2014 1155   CL 100 06/01/2014 1155   CO2 26 06/25/2014 0943   CO2 27 06/01/2014 1155   BUN 16.2 06/25/2014 0943   BUN 13 06/01/2014 1155  CREATININE 0.6 06/25/2014 0943   CREATININE 0.59 06/01/2014 1155   CREATININE 0.62 11/28/2013 1635      Component Value Date/Time   CALCIUM 9.2 06/25/2014 0943   CALCIUM 9.5 06/01/2014 1155   ALKPHOS 66 06/25/2014 0943   ALKPHOS 46 12/21/2013 0450   AST 44* 06/25/2014 0943   AST 42* 12/21/2013 0450   ALT 35 06/25/2014 0943   ALT 25 12/21/2013 0450   BILITOT 0.33 06/25/2014 0943   BILITOT 0.7 12/21/2013 0450     Results for JESSICIA, NAPOLITANO (MRN [id number]) as of 06/18/2014 09:36  Ref. Range 05/07/2014 08:13 05/14/2014 08:21 05/21/2014 08:22 05/28/2014 09:23 06/04/2014 08:14  CA 19-9 Latest Range: <35.0 U/mL 30.5 28.8 23.9 21.0 25.1     Lab Results  Component Value Date   LABCA2 8 06/08/2011    No components found with this basename: [id number]    No results found for this basename: INR,  in the last 168 hours  Urinalysis    Component Value Date/Time   COLORURINE YELLOW 06/07/2011 1942   APPEARANCEUR CLEAR 06/07/2011 1942   LABSPEC 1.010 02/15/2014 1109   LABSPEC 1.009 06/07/2011 1942   PHURINE 6.0 06/07/2011 1942   GLUCOSEU Negative 02/15/2014 [place] 06/07/2011 1942   HGBUR NEGATIVE 06/07/2011 1942   BILIRUBINUR neg 12/25/2011 1216   BILIRUBINUR NEGATIVE 06/07/2011 1942   KETONESUR NEGATIVE 06/07/2011 1942   PROTEINUR neg 12/25/2011 1216   PROTEINUR NEGATIVE 06/07/2011 1942   UROBILINOGEN 0.2 02/15/2014 1109   UROBILINOGEN negative 12/25/2011 1216   UROBILINOGEN 0.2 06/07/2011 1942   NITRITE neg 12/25/2011 1216   NITRITE NEGATIVE 06/07/2011 1942   LEUKOCYTESUR Negative 12/25/2011 1216    STUDIES: No results found.  ASSESSMENT: 67 y.o. 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 noted on abdominal MRI November 2014  PANCREATIC CANCER: (3) status post endoscopic ultrasonography of the pancreas 11/23/2013 with cytology positive for adenocarcinoma, clinical stage T2 NX MX adenocarcinoma associated with a 5.5 cm pseudocyst (which was aspirated); baseline CA 19-9 was 47.0  (4) left  pleural effusion status post thoracentesis 12/01/2013, cytology negative  (5) s/p laparoscopic cholecystectomy, open extended distal pancreatectomy, splenectomy, partial gastrectomy x2 12/20/2013 for a pT1 pN0, stage IA invasive ductal adenocarcinoma, grade 2, with positive resection margins  (6) adjuvant gemcitabine started 02/01/2014-- received 3 weekly doses before radiation, will reeive 9 additional doses starting 3-5 weeks after completion of radiation  (7) adjuvant radiation started 02/27/2014, with radiosensitization (capecitabine 1000 mg po BID on radiation days); completed 04/06/2014  (8) adjuvant gemcitabine weekly resumed 05/07/2014, with 9 doses planned  PLAN: Joene is tolerating the gemcitabine without any major complications. I reassured her that her hair will not only grow back but will grow back stronger. If she wants to lose weight of course you'll have to exercise. I have encouraged her and Annette Beltran to walk twice a day, even if the  only walk short distances.  She will have her last Gemzar treatment next week. I intend to restage her approximately 3 months from now. She has a good understanding of the overall plan. She agrees with that. She will call with any problems that may develop before her next visit.  Chauncey Cruel, MD   07/02/2014 9:00 AM

## 2014-07-02 NOTE — Patient Instructions (Signed)
[facility] Discharge Instructions for Patients Receiving Chemotherapy  Today you received the following chemotherapy agents Gemcitabine.   To help prevent nausea and vomiting after your treatment, we encourage you to take your nausea medication as directed.    If you develop nausea and vomiting that is not controlled by your nausea medication, call the clinic.   BELOW ARE SYMPTOMS THAT SHOULD BE REPORTED IMMEDIATELY:  *FEVER GREATER THAN 100.5 F  *CHILLS WITH OR WITHOUT FEVER  NAUSEA AND VOMITING THAT IS NOT CONTROLLED WITH YOUR NAUSEA MEDICATION  *UNUSUAL SHORTNESS OF BREATH  *UNUSUAL BRUISING OR BLEEDING  TENDERNESS IN MOUTH AND THROAT WITH OR WITHOUT PRESENCE OF ULCERS  *URINARY PROBLEMS  *BOWEL PROBLEMS  UNUSUAL RASH Items with * indicate a potential emergency and should be followed up as soon as possible.  Feel free to call the clinic you have any questions or concerns. The clinic phone number is [phone].    

## 2014-07-02 NOTE — Telephone Encounter (Signed)
per pof to sch appt-pt aware of appt-added lab after appt-no 8:00 am lab appt avail

## 2014-07-04 ENCOUNTER — Other Ambulatory Visit: Payer: Self-pay | Admitting: Internal Medicine

## 2014-07-04 NOTE — Telephone Encounter (Signed)
Please refill x 6 months 

## 2014-07-10 ENCOUNTER — Other Ambulatory Visit (HOSPITAL_BASED_OUTPATIENT_CLINIC_OR_DEPARTMENT_OTHER): Payer: Medicare Other

## 2014-07-10 ENCOUNTER — Ambulatory Visit (HOSPITAL_BASED_OUTPATIENT_CLINIC_OR_DEPARTMENT_OTHER): Payer: Medicare Other

## 2014-07-10 ENCOUNTER — Telehealth: Payer: Self-pay | Admitting: Oncology

## 2014-07-10 ENCOUNTER — Other Ambulatory Visit: Payer: Medicare Other

## 2014-07-10 ENCOUNTER — Other Ambulatory Visit: Payer: Self-pay | Admitting: *Deleted

## 2014-07-10 ENCOUNTER — Ambulatory Visit (HOSPITAL_BASED_OUTPATIENT_CLINIC_OR_DEPARTMENT_OTHER): Payer: Medicare Other | Admitting: Oncology

## 2014-07-10 VITALS — BP 165/68 | HR 45 | Temp 97.9°F | Resp 20 | Ht 64.0 in | Wt 157.9 lb

## 2014-07-10 DIAGNOSIS — D735 Infarction of spleen: Secondary | ICD-10-CM

## 2014-07-10 DIAGNOSIS — H81319 Aural vertigo, unspecified ear: Secondary | ICD-10-CM

## 2014-07-10 DIAGNOSIS — I700 Atherosclerosis of aorta: Secondary | ICD-10-CM

## 2014-07-10 DIAGNOSIS — K52831 Collagenous colitis: Secondary | ICD-10-CM

## 2014-07-10 DIAGNOSIS — C251 Malignant neoplasm of body of pancreas: Secondary | ICD-10-CM

## 2014-07-10 DIAGNOSIS — M8580 Other specified disorders of bone density and structure, unspecified site: Secondary | ICD-10-CM

## 2014-07-10 DIAGNOSIS — C257 Malignant neoplasm of other parts of pancreas: Secondary | ICD-10-CM

## 2014-07-10 DIAGNOSIS — C50911 Malignant neoplasm of unspecified site of right female breast: Secondary | ICD-10-CM

## 2014-07-10 DIAGNOSIS — Z5111 Encounter for antineoplastic chemotherapy: Secondary | ICD-10-CM

## 2014-07-10 DIAGNOSIS — E440 Moderate protein-calorie malnutrition: Secondary | ICD-10-CM

## 2014-07-10 DIAGNOSIS — K5289 Other specified noninfective gastroenteritis and colitis: Secondary | ICD-10-CM

## 2014-07-10 DIAGNOSIS — C259 Malignant neoplasm of pancreas, unspecified: Secondary | ICD-10-CM

## 2014-07-10 LAB — CBC WITH DIFFERENTIAL/PLATELET
BASO%: 1.3 % (ref 0.0–2.0)
BASOS ABS: 0.1 10*3/uL (ref 0.0–0.1)
EOS ABS: 0.2 10*3/uL (ref 0.0–0.5)
EOS%: 4.3 % (ref 0.0–7.0)
HCT: 33.5 % — ABNORMAL LOW (ref 34.8–46.6)
HEMOGLOBIN: 11.7 g/dL (ref 11.6–15.9)
LYMPH%: 10.1 % — ABNORMAL LOW (ref 14.0–49.7)
MCH: 36.1 pg — ABNORMAL HIGH (ref 25.1–34.0)
MCHC: 34.9 g/dL (ref 31.5–36.0)
MCV: 103.4 fL — ABNORMAL HIGH (ref 79.5–101.0)
MONO#: 1.2 10*3/uL — ABNORMAL HIGH (ref 0.1–0.9)
MONO%: 24.9 % — ABNORMAL HIGH (ref 0.0–14.0)
NEUT%: 59.4 % (ref 38.4–76.8)
NEUTROS ABS: 2.8 10*3/uL (ref 1.5–6.5)
Platelets: 214 10*3/uL (ref 145–400)
RBC: 3.24 10*6/uL — AB (ref 3.70–5.45)
RDW: 18.6 % — AB (ref 11.2–14.5)
WBC: 4.7 10*3/uL (ref 3.9–10.3)
lymph#: 0.5 10*3/uL — ABNORMAL LOW (ref 0.9–3.3)
nRBC: 5 % — ABNORMAL HIGH (ref 0–0)

## 2014-07-10 LAB — COMPREHENSIVE METABOLIC PANEL (CC13)
ALT: 33 U/L (ref 0–55)
AST: 36 U/L — AB (ref 5–34)
Albumin: 3.3 g/dL — ABNORMAL LOW (ref 3.5–5.0)
Alkaline Phosphatase: 76 U/L (ref 40–150)
Anion Gap: 9 mEq/L (ref 3–11)
BUN: 16.3 mg/dL (ref 7.0–26.0)
CALCIUM: 8.8 mg/dL (ref 8.4–10.4)
CHLORIDE: 106 meq/L (ref 98–109)
CO2: 25 meq/L (ref 22–29)
Creatinine: 0.9 mg/dL (ref 0.6–1.1)
Glucose: 154 mg/dl — ABNORMAL HIGH (ref 70–140)
Potassium: 4.4 mEq/L (ref 3.5–5.1)
Sodium: 140 mEq/L (ref 136–145)
Total Bilirubin: 0.37 mg/dL (ref 0.20–1.20)
Total Protein: 6.7 g/dL (ref 6.4–8.3)

## 2014-07-10 LAB — CANCER ANTIGEN 19-9: CA 19-9: 22.4 U/mL (ref <35.0)

## 2014-07-10 MED ORDER — HEPARIN SOD (PORK) LOCK FLUSH 100 UNIT/ML IV SOLN
500.0000 [IU] | Freq: Once | INTRAVENOUS | Status: AC | PRN
  Administered 2014-07-10: 500 [IU]
  Filled 2014-07-10: qty 5

## 2014-07-10 MED ORDER — SODIUM CHLORIDE 0.9 % IV SOLN
Freq: Once | INTRAVENOUS | Status: AC
  Administered 2014-07-10: 10:00:00 via INTRAVENOUS

## 2014-07-10 MED ORDER — PROCHLORPERAZINE MALEATE 10 MG PO TABS
10.0000 mg | ORAL_TABLET | Freq: Once | ORAL | Status: AC
  Administered 2014-07-10: 10 mg via ORAL

## 2014-07-10 MED ORDER — MECLIZINE HCL 32 MG PO TABS: 32.0000 mg | ORAL_TABLET | Freq: Three times a day (TID) | ORAL | Status: DC | PRN

## 2014-07-10 MED ORDER — MECLIZINE HCL 25 MG PO TABS: 25.0000 mg | ORAL_TABLET | Freq: Three times a day (TID) | ORAL | Status: DC | PRN

## 2014-07-10 MED ORDER — SODIUM CHLORIDE 0.9 % IJ SOLN
10.0000 mL | INTRAMUSCULAR | Status: DC | PRN
  Administered 2014-07-10: 10 mL
  Filled 2014-07-10: qty 10

## 2014-07-10 MED ORDER — PROCHLORPERAZINE MALEATE 10 MG PO TABS
ORAL_TABLET | ORAL | Status: AC
  Filled 2014-07-10: qty 1

## 2014-07-10 MED ORDER — SODIUM CHLORIDE 0.9 % IV SOLN
1000.0000 mg/m2 | Freq: Once | INTRAVENOUS | Status: AC
  Administered 2014-07-10: 1748 mg via INTRAVENOUS
  Filled 2014-07-10: qty 45.97

## 2014-07-10 NOTE — Progress Notes (Signed)
[place]  [phone]) [phone] Fax:(336) [phone]     ID: Annette Beltran OB: May 23, 1946  MR#: [id number]  PQZ#:[id number]  PCP: Annette Showers, MD GYN:   SU: Annette Beltran OTHER MD: Annette Beltran, Annette Beltran  CHIEF COMPLAINT: Early stage pancreatic cancer resected with positive margins  CURRENT TREATMENT: Adjuvant gemcitabine   PANCREATIC CANCER HISTORY: I formerly followed Annette Beltran for a stage I breast cancer, which was treated with surgery, radiation, and anti-estrogens. She was released from followup here in 2007. In addition, in 2012 she had abdominal pain leading to hospitalization. 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 The patient also has a history of collagenous colitis, which of course carries its own set of symptoms, making identification of her new attic problem more difficult.  Annette Beltran tells me she started "feeling bad" in September of 2014. There was abdominal discomfort localizing to the left upper o'clock on, worse with inspiration. CT angiography 08/21/2013 showed no clot and no significant abnormalities. Plain rib and left shoulder views were negative. CT abdomen and pelvis with contrast 08/22/2013 showed slight prominence of the pancreatic duct in the distal body and tail with a new small structure emanating from the tail of the pancreas consistent with a pseudocyst. Exudate near the tail of the pancreas suggested pancreatitis. MRI of the abdomen 09/03/2013 confirmed several ill-defined fluid collections between the gastric fundus and the splenic hilum surrounding the pancreatic tail. There was no evidence of a pancreatic mass. The pancreatic tail did enhanced following contrast. The splenic vein appeared chronically thrombosed with a small amount of  nonocclusive thrombus extending into the main and left portal veins.  On 09/26/2013 amylase was 237 and lipase 513.0, consistent with acute pancreatitis. These numbers subsequently dropped in on 11/28/2013 amylase was normal at 47 and lipase was mildly elevated at 110  With continuing left-sided abdominal discomfort, abdominal ultrasound 11/13/2013 showed multiple gallstones with no evidence of cholecystitis. Evaluation of the pancreas showed a hypoechoic lobulated focus measuring up to 7.4 cm felt to be nonspecific. Repeat abdominal CT 11/14/2013 showed diffuse pancreatic atrophy without evidence of a mass, again consistent with chronic pancreatitis. There was mild soft tissue stranding suspicious for mild superimposed acute pancreatitis. A new rim-enhancing fluid collection was noted in the gastrosplenic ligament measuring 4.5 cm, consistent with a pseudocyst. Finally on 11/23/2013, endoscopic ultrasonography under Annette Beltran showed a subtle 2.5 cm soft tissue mass in the mid-pancreas, abutting the splenic vein. This was biopsied x2, with cytology (NZB [id number]) showing adenocarcinoma. A peripancreatic fluid collection measuring over 5 cm was aspirated yielding 25 cc of chocolate colored fluid. Two or three scattered round hypoechoic lymph nodes were noted near the pancreatic tail but were not sampled.  In addition, on 11/28/2013 a chest x-ray obtained to evaluate upper respiratory symptoms showed a small to moderate left effusion. Chest CT scan 11/29/2013 confirmed a moderate left-sided pleural effusion with no suspicious appearing pulmonary nodules or masses associated with it. The complex cystic lesion associated with the tail of the pancreas was again noted, measuring 5.5 cm despite the recent aspiration procedure.  The patient's subsequent history is as detailed below.  INTERVAL HISTORY: Annette Beltran returns today for followup of Annette Beltran's pancreatic cancer accompanied by her husband Annette Beltran. Today is day 1  cycle 5 of 4 planned cycles of gemcitabine given  days 1, 8, and 15 of each 21 day cycle--this is really a make up day because she missed day 15 of cycle 3. This is her last scheduled gemcitabine treatment.  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 He has another 17 treatments to go. He is managing his hemorrhoidal and rectal pain as discussed at the last visit.]  REVIEW OF SYSTEMS: Annette Beltran did not do much over the labor day weekend. She was very tired. She mostly slept. She has also developed some vertigo. She denies any sinus symptoms and takes Claritin daily. She had mild nausea. Overall "it was not a good week". Of course she is also concerned regarding Annette Beltran's problems. She has noted a little "bump" next to the abdominal scar and she wanted to make sure I examined that today. She gets a little bit constipated with each treatment but then things normalized. A detailed review of systems today was otherwise noncontributory  PAST MEDICAL HISTORY: Past Medical History  Diagnosis Date  . Macular degeneration   . Fluid retention   . Colitis, collagenous   . Vitamin D deficiency   . Osteopenia   . Celiac artery stenosis   . Splenic infarction   . Arthritis   . History of blood clots   . Anxiety   . Shingles   . Pancreatitis   . Osteoporosis   . Clotting disorder   . Breast cancer     rt lumpectomy  . Arrhythmia     "skips a beat"  [place]  heart  . GERD (gastroesophageal reflux disease)     PAST SURGICAL HISTORY: Past Surgical History  Procedure Laterality Date  . Foot surgery  2003    x 3, 2 on right, 1 on left  . Breast lumpectomy Right     radiation for 6 weeks  . Meniscus repair Right   . Eus N/A 11/23/2013    Procedure: UPPER ENDOSCOPIC ULTRASOUND (EUS) LINEAR;  Surgeon: Annette Banister, MD;  Location: [facility];  Service: Endoscopy;  Laterality: N/A;  . Tonsillectomy    . Laparoscopy N/A 12/20/2013    Procedure: LAPAROSCOPY DIAGNOSTIC  ;  Surgeon: Annette Klein, MD;  Location: [facility];  Service: General;  Laterality: N/A;  . Cholecystectomy N/A 12/20/2013    Procedure: LAPAROSCOPIC CHOLECYSTECTOMY;  Surgeon: Annette Klein, MD;  Location: [facility];  Service: General;  Laterality: N/A;  . Splenectomy, total N/A 12/20/2013    Procedure: SPLENECTOMY;  Surgeon: Annette Klein, MD;  Location: [place];  Service: General;  Laterality: N/A;  . Partial gastrectomy N/A 12/20/2013    Procedure: PARTIAL GASTRECTOMY;  Surgeon: Annette Klein, MD;  Location: [facility];  Service: General;  Laterality: N/A;  . Portacath placement N/A 01/15/2014    Procedure: INSERTION PORT-A-CATH;  Surgeon: Annette Klein, MD;  Location: [facility];  Service: General;  Laterality: N/A;    FAMILY HISTORY Family History  Problem Relation Age of Onset  . Colon cancer Neg Hx   . Aneurysm Mother   . Stroke Mother   . Prostate cancer Father 2  . Skin cancer Brother 82    treated with interferon for 1 year  . Heart failure Paternal Aunt   . Breast cancer Paternal Aunt     dx over 60s  . Pancreatic cancer Maternal Grandmother     dx in her 48s  . Breast cancer Maternal Aunt     dx over 60  . Rheum arthritis Brother   . Breast cancer Paternal Aunt  dx over 59s  . Leukemia Paternal Aunt    the patient's father had a diagnosis of prostate cancer metastatic to the liver or a died at age 88. The patient's mother died secondary to carotid third brain aneurysm at the age of 43. The patient had 2 brothers, no sisters. One brother has a history of melanoma. The patient's mother is mother was diagnosed with pancreatic cancer in her 59s. There is a history of breast cancer and second degree relatives, none before the age of 56  GYNECOLOGIC HISTORY:  Menarche age 56, first live birth age 69, the patient is [place] P2.. Stopped in her early 4s. 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 Her husband of 30 years, Annette Beltran, retired from [employer] and currently works part-time as a Cabin crew with [employer]. Daughter Marita [place] teaches first grade. Daughter Leafy Ro is a homemaker. The patient has 4 grandchildren Annette Beltran has an additional 5 grandchildren of his own). The patient attends a [employer]    ADVANCED DIRECTIVES: In place   HEALTH MAINTENANCE: History  Substance Use Topics  . Smoking status: Former Smoker -- 0.50 packs/day for 28 years    Types: Cigarettes    Quit date: 11/02/2000  . Smokeless tobacco: Never Used  . Alcohol Use: Yes     Comment: occ wine      Colonoscopy:  PAP:  Bone density:  Lipid panel:  Mammography:  Allergies  Allergen Reactions  . Tape     Allergic  To  Tegaderm.  . Codeine Nausea And Vomiting  . Lactose Intolerance (Gi)   . Tequin     Severe stomach pain  . Penicillins Nausea Only and Rash    Current Outpatient Prescriptions  Medication Sig Dispense Refill  . ALPRAZolam (XANAX) 0.5 MG tablet TAKE ONE TABLET TWICE DAILY AS NEEDED FOR ANXIETY  60 tablet  5  . antiseptic oral rinse (BIOTENE) LIQD 15 mLs by Mouth Rinse route as needed for dry mouth.      . Ascorbic Acid (VITAMIN C PO) Take 1 tablet by mouth 2 (two) times daily.      Marland Kitchen aspirin EC 81 MG tablet Take 81 mg by mouth daily.      Marland Kitchen b complex vitamins tablet Take 1 tablet by mouth daily.      . Biotin 1 MG CAPS Take by mouth.      . diphenhydrAMINE (BENADRYL) 25 MG tablet Take 25 mg by mouth at bedtime.       . feeding supplement, RESOURCE BREEZE, (RESOURCE BREEZE) LIQD Take 1 Container by mouth 2 (two) times daily between meals.  30 Container  3  . furosemide (LASIX) 20 MG tablet Take 20 mg by mouth daily as needed for fluid.      Marland Kitchen lidocaine-prilocaine (EMLA) cream Apply 1-2 hours to port a cath before procedure.  30 g  6  . lipase/protease/amylase (CREON-12/PANCREASE) 12000 UNITS CPEP capsule Take 2 capsules by mouth 3 (three) times daily  with meals.  270 capsule  3  . loratadine (CLARITIN) 10 MG tablet Take 10 mg by mouth daily.      . Naproxen Sodium (ALEVE) 220 MG CAPS Take 440 mg by mouth 2 (two) times daily as needed (Pain).      . ondansetron (ZOFRAN) 8 MG tablet Take 1 tablet (8 mg total) by mouth 2 (two)  times daily as needed (Nausea or vomiting).  30 tablet  1  . PARoxetine (PAXIL) 10 MG tablet Take 10 mg by mouth every morning.      Marland Kitchen PARoxetine (PAXIL) 10 MG tablet TAKE ONE TABLET BY MOUTH ONCE DAILY  30 tablet  7  . prochlorperazine (COMPAZINE) 10 MG tablet Take 1 tablet (10 mg total) by mouth every 6 (six) hours as needed (Nausea or vomiting).  30 tablet  1  . valACYclovir (VALTREX) 1000 MG tablet Take 1 tablet (1,000 mg total) by mouth 2 (two) times daily.  90 tablet  1   No current facility-administered medications for this visit.    OBJECTIVE: Middle-aged white woman in no acute distress Filed Vitals:   07/10/14 0812  BP: 165/68  Pulse: 45  Temp: 97.9 F (36.6 C)  Resp: 20     Body mass index is 27.09 kg/(m^2).    ECOG FS:1 - Symptomatic but completely ambulatory  Sclerae unicteric, pupils round and equal, no nystagmus Oropharynx clear and moist, both tympanic membranes are pearly with normal light reflex No cervical or supraclavicular adenopathy Lungs no rales or rhonchi Heart regular rate and rhythm Abd soft, nontender all quadrants, positive bowel sounds; towards the middle of the scar, on the right side, there is a slight induration. There is no erythema. This feels much like a little bit of scar tissue, not a subcutaneous recurrence MSK no focal spinal tenderness, no upper extremity lymphedema Neuro: nonfocal, well oriented, appropriate affect Breasts: Deferred    LAB RESULTS:  CMP     Component Value Date/Time   NA 139 07/02/2014 0914   NA 139 06/01/2014 1155   K 4.5 07/02/2014 0914   K 4.6 06/01/2014 1155   CL 100 06/01/2014 1155   CO2 28 07/02/2014 0914   CO2 27 06/01/2014 1155   GLUCOSE  172* 07/02/2014 0914   GLUCOSE 101* 06/01/2014 1155   BUN 15.7 07/02/2014 0914   BUN 13 06/01/2014 1155   CREATININE 0.8 07/02/2014 0914   CREATININE 0.59 06/01/2014 1155   CREATININE 0.62 11/28/2013 1635   CALCIUM 8.9 07/02/2014 0914   CALCIUM 9.5 06/01/2014 1155   PROT 6.6 07/02/2014 0914   PROT 5.0* 12/21/2013 0450   ALBUMIN 3.4* 07/02/2014 0914   ALBUMIN 2.3* 12/21/2013 0450   AST 31 07/02/2014 0914   AST 42* 12/21/2013 0450   ALT 33 07/02/2014 0914   ALT 25 12/21/2013 0450   ALKPHOS 74 07/02/2014 0914   ALKPHOS 46 12/21/2013 0450   BILITOT 0.37 07/02/2014 0914   BILITOT 0.7 12/21/2013 0450   GFRNONAA >90 06/01/2014 1155   GFRAA >90 06/01/2014 1155    I No results found for this basename: SPEP,  UPEP,   kappa and lambda light chains    Lab Results  Component Value Date   WBC 4.6 07/02/2014   NEUTROABS 3.0 07/02/2014   HGB 11.4* 07/02/2014   HCT 33.0* 07/02/2014   MCV 102.8* 07/02/2014   PLT 290 07/02/2014      Chemistry      Component Value Date/Time   NA 139 07/02/2014 0914   NA 139 06/01/2014 1155   K 4.5 07/02/2014 0914   K 4.6 06/01/2014 1155   CL 100 06/01/2014 1155   CO2 28 07/02/2014 0914   CO2 27 06/01/2014 1155   BUN 15.7 07/02/2014 0914   BUN 13 06/01/2014 1155   CREATININE 0.8 07/02/2014 0914   CREATININE 0.59 06/01/2014 1155   CREATININE 0.62 11/28/2013 1635  Component Value Date/Time   CALCIUM 8.9 07/02/2014 0914   CALCIUM 9.5 06/01/2014 1155   ALKPHOS 74 07/02/2014 0914   ALKPHOS 46 12/21/2013 0450   AST 31 07/02/2014 0914   AST 42* 12/21/2013 0450   ALT 33 07/02/2014 0914   ALT 25 12/21/2013 0450   BILITOT 0.37 07/02/2014 0914   BILITOT 0.7 12/21/2013 0450    Results for OBELIA, BONELLO (MRN [id number]) as of 07/10/2014 08:13  Ref. Range 05/07/2014 08:13 05/14/2014 08:21 05/21/2014 08:22 05/28/2014 09:23 06/04/2014 08:14  CA 19-9 Latest Range: <35.0 U/mL 30.5 28.8 23.9 21.0 25.1     Lab Results  Component Value Date   LABCA2 8 06/08/2011    No components found with this  basename: [id number]    No results found for this basename: INR,  in the last 168 hours  Urinalysis    Component Value Date/Time   COLORURINE YELLOW 06/07/2011 1942   APPEARANCEUR CLEAR 06/07/2011 1942   LABSPEC 1.010 02/15/2014 1109   LABSPEC 1.009 06/07/2011 1942   PHURINE 6.0 06/07/2011 1942   GLUCOSEU Negative 02/15/2014 [place] 06/07/2011 1942   HGBUR NEGATIVE 06/07/2011 1942   BILIRUBINUR neg 12/25/2011 1216   BILIRUBINUR NEGATIVE 06/07/2011 1942   KETONESUR NEGATIVE 06/07/2011 1942   PROTEINUR neg 12/25/2011 1216   PROTEINUR NEGATIVE 06/07/2011 1942   UROBILINOGEN 0.2 02/15/2014 1109   UROBILINOGEN negative 12/25/2011 1216   UROBILINOGEN 0.2 06/07/2011 1942   NITRITE neg 12/25/2011 1216   NITRITE NEGATIVE 06/07/2011 1942   LEUKOCYTESUR Negative 12/25/2011 1216    STUDIES: No new results found.  ASSESSMENT: 68 y.o. 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 noted on abdominal MRI November 2014  PANCREATIC CANCER: (3) status post endoscopic ultrasonography of the pancreas 11/23/2013 with cytology positive for adenocarcinoma, clinical stage T2 NX MX adenocarcinoma associated with a 5.5 cm pseudocyst (which was aspirated); baseline CA 19-9 was 47.0  (4) left pleural effusion status post thoracentesis 12/01/2013, cytology negative  (5) s/p  laparoscopic cholecystectomy, open extended distal pancreatectomy, splenectomy, partial gastrectomy x2 12/20/2013 for a pT1 pN0, stage IA invasive ductal adenocarcinoma, grade 2, with positive resection margins  (6) adjuvant gemcitabine started 02/01/2014-- received 3 weekly doses before radiation, will reeive 9 additional doses starting 3-5 weeks after completion of radiation  (7) adjuvant radiation started 02/27/2014, with radiosensitization (capecitabine 1000 mg po BID on radiation days); completed 04/06/2014  (8) adjuvant gemcitabine weekly resumed 05/07/2014, with 9 doses planned  PLAN: Venezia completes her adjuvant gemcitabine treatment today. She has done remarkably well with her treatments, with no significant toxicities other than some constipation, mild hair loss, mild nausea, and some fatigue.  She is having some vertigo, which is not related to her treatment or diagnosis. I have prescribed meclizine for her. If it persists we can try some steroids.  She has another "shadow" at the end of September. I  have scheduled her for restaging CT scans October 12. She will return to see me October 14. Assuming there is no evidence of active disease, I will start seeing her on an every 3 month basis with scans every 6 months for the next 2 years. She also will have her port removed at her discretion after the scans  Jakala has a good understanding of the overall plan. She agrees with it. She knows the goal of treatment in her case is cure. She will call with any problems that may develop before her next visit.  Chauncey Cruel, MD   07/10/2014 8:13 AM

## 2014-07-10 NOTE — Patient Instructions (Signed)
[facility] Discharge Instructions for Patients Receiving Chemotherapy  Today you received the following chemotherapy agents Gemzar.  To help prevent nausea and vomiting after your treatment, we encourage you to take your nausea medication as prescribed.   If you develop nausea and vomiting that is not controlled by your nausea medication, call the clinic.   BELOW ARE SYMPTOMS THAT SHOULD BE REPORTED IMMEDIATELY:  *FEVER GREATER THAN 100.5 F  *CHILLS WITH OR WITHOUT FEVER  NAUSEA AND VOMITING THAT IS NOT CONTROLLED WITH YOUR NAUSEA MEDICATION  *UNUSUAL SHORTNESS OF BREATH  *UNUSUAL BRUISING OR BLEEDING  TENDERNESS IN MOUTH AND THROAT WITH OR WITHOUT PRESENCE OF ULCERS  *URINARY PROBLEMS  *BOWEL PROBLEMS  UNUSUAL RASH Items with * indicate a potential emergency and should be followed up as soon as possible.  Feel free to call the clinic you have any questions or concerns. The clinic phone number is [phone].    

## 2014-07-11 ENCOUNTER — Other Ambulatory Visit: Payer: Self-pay | Admitting: Oncology

## 2014-08-13 ENCOUNTER — Ambulatory Visit (HOSPITAL_COMMUNITY)
Admission: RE | Admit: 2014-08-13 | Discharge: 2014-08-13 | Disposition: A | Discharge status: Home / Self Care | Payer: Medicare Other | Source: Ambulatory Visit | Attending: Oncology | Admitting: Oncology

## 2014-08-13 ENCOUNTER — Encounter (HOSPITAL_COMMUNITY): Payer: Self-pay

## 2014-08-13 DIAGNOSIS — C251 Malignant neoplasm of body of pancreas: Secondary | ICD-10-CM | POA: Insufficient documentation

## 2014-08-13 DIAGNOSIS — Z9221 Personal history of antineoplastic chemotherapy: Secondary | ICD-10-CM | POA: Insufficient documentation

## 2014-08-13 DIAGNOSIS — K52831 Collagenous colitis: Secondary | ICD-10-CM

## 2014-08-13 DIAGNOSIS — C50911 Malignant neoplasm of unspecified site of right female breast: Secondary | ICD-10-CM | POA: Diagnosis not present

## 2014-08-13 DIAGNOSIS — Z923 Personal history of irradiation: Secondary | ICD-10-CM | POA: Diagnosis not present

## 2014-08-13 DIAGNOSIS — I700 Atherosclerosis of aorta: Secondary | ICD-10-CM

## 2014-08-13 DIAGNOSIS — Z9889 Other specified postprocedural states: Secondary | ICD-10-CM | POA: Diagnosis not present

## 2014-08-13 MED ORDER — IOHEXOL 300 MG/ML  SOLN
100.0000 mL | Freq: Once | INTRAMUSCULAR | Status: AC | PRN
  Administered 2014-08-13: 100 mL via INTRAVENOUS

## 2014-08-15 ENCOUNTER — Ambulatory Visit: Payer: Medicare Other | Admitting: Internal Medicine

## 2014-08-15 ENCOUNTER — Other Ambulatory Visit (HOSPITAL_BASED_OUTPATIENT_CLINIC_OR_DEPARTMENT_OTHER): Payer: Medicare Other

## 2014-08-15 ENCOUNTER — Telehealth: Payer: Self-pay | Admitting: Oncology

## 2014-08-15 ENCOUNTER — Ambulatory Visit (HOSPITAL_BASED_OUTPATIENT_CLINIC_OR_DEPARTMENT_OTHER): Payer: Medicare Other | Admitting: Oncology

## 2014-08-15 VITALS — BP 426/55 | HR 45 | Temp 98.2°F | Resp 18 | Ht 64.0 in | Wt 159.6 lb

## 2014-08-15 DIAGNOSIS — Z853 Personal history of malignant neoplasm of breast: Secondary | ICD-10-CM | POA: Diagnosis not present

## 2014-08-15 DIAGNOSIS — C251 Malignant neoplasm of body of pancreas: Secondary | ICD-10-CM

## 2014-08-15 DIAGNOSIS — D735 Infarction of spleen: Secondary | ICD-10-CM

## 2014-08-15 DIAGNOSIS — C259 Malignant neoplasm of pancreas, unspecified: Secondary | ICD-10-CM

## 2014-08-15 DIAGNOSIS — K52831 Collagenous colitis: Secondary | ICD-10-CM

## 2014-08-15 DIAGNOSIS — C7989 Secondary malignant neoplasm of other specified sites: Secondary | ICD-10-CM | POA: Diagnosis not present

## 2014-08-15 DIAGNOSIS — Z23 Encounter for immunization: Secondary | ICD-10-CM

## 2014-08-15 DIAGNOSIS — M8580 Other specified disorders of bone density and structure, unspecified site: Secondary | ICD-10-CM

## 2014-08-15 DIAGNOSIS — C252 Malignant neoplasm of tail of pancreas: Secondary | ICD-10-CM | POA: Diagnosis not present

## 2014-08-15 DIAGNOSIS — C50911 Malignant neoplasm of unspecified site of right female breast: Secondary | ICD-10-CM

## 2014-08-15 DIAGNOSIS — I700 Atherosclerosis of aorta: Secondary | ICD-10-CM

## 2014-08-15 LAB — CBC WITH DIFFERENTIAL/PLATELET
BASO%: 1.2 % (ref 0.0–2.0)
Basophils Absolute: 0.1 10*3/uL (ref 0.0–0.1)
EOS%: 16.4 % — ABNORMAL HIGH (ref 0.0–7.0)
Eosinophils Absolute: 1.3 10*3/uL — ABNORMAL HIGH (ref 0.0–0.5)
HCT: 41.1 % (ref 34.8–46.6)
HGB: 13.8 g/dL (ref 11.6–15.9)
LYMPH#: 0.7 10*3/uL — AB (ref 0.9–3.3)
LYMPH%: 8.6 % — ABNORMAL LOW (ref 14.0–49.7)
MCH: 34.2 pg — ABNORMAL HIGH (ref 25.1–34.0)
MCHC: 33.6 g/dL (ref 31.5–36.0)
MCV: 101.7 fL — ABNORMAL HIGH (ref 79.5–101.0)
MONO#: 1.3 10*3/uL — AB (ref 0.1–0.9)
MONO%: 16.1 % — ABNORMAL HIGH (ref 0.0–14.0)
NEUT%: 57.7 % (ref 38.4–76.8)
NEUTROS ABS: 4.7 10*3/uL (ref 1.5–6.5)
PLATELETS: 299 10*3/uL (ref 145–400)
RBC: 4.05 10*6/uL (ref 3.70–5.45)
RDW: 14.3 % (ref 11.2–14.5)
WBC: 8.1 10*3/uL (ref 3.9–10.3)

## 2014-08-15 LAB — COMPREHENSIVE METABOLIC PANEL (CC13)
ALBUMIN: 3.3 g/dL — AB (ref 3.5–5.0)
ALT: 12 U/L (ref 0–55)
ANION GAP: 9 meq/L (ref 3–11)
AST: 23 U/L (ref 5–34)
Alkaline Phosphatase: 72 U/L (ref 40–150)
BUN: 18 mg/dL (ref 7.0–26.0)
CALCIUM: 9.2 mg/dL (ref 8.4–10.4)
CHLORIDE: 107 meq/L (ref 98–109)
CO2: 26 meq/L (ref 22–29)
Creatinine: 0.8 mg/dL (ref 0.6–1.1)
Glucose: 158 mg/dl — ABNORMAL HIGH (ref 70–140)
POTASSIUM: 4.7 meq/L (ref 3.5–5.1)
SODIUM: 141 meq/L (ref 136–145)
TOTAL PROTEIN: 6.8 g/dL (ref 6.4–8.3)
Total Bilirubin: 0.49 mg/dL (ref 0.20–1.20)

## 2014-08-15 LAB — CANCER ANTIGEN 19-9: CA 19 9: 26.1 U/mL (ref <35.0)

## 2014-08-15 MED ORDER — INFLUENZA VAC SPLIT QUAD 0.5 ML IM SUSY
0.5000 mL | PREFILLED_SYRINGE | Freq: Once | INTRAMUSCULAR | Status: AC
  Administered 2014-08-15: 0.5 mL via INTRAMUSCULAR
  Filled 2014-08-15: qty 0.5

## 2014-08-15 MED ORDER — PANCRELIPASE (LIP-PROT-AMYL) 12000-38000 UNITS PO CPEP: ORAL_CAPSULE | ORAL | Status: DC

## 2014-08-15 MED ORDER — HAEMOPHILUS B POLYSAC CONJ VAC IM SOLR
0.5000 mL | Freq: Once | INTRAMUSCULAR | Status: AC
  Administered 2014-08-15: 0.5 mL via INTRAMUSCULAR
  Filled 2014-08-15: qty 0.5

## 2014-08-15 MED ORDER — PNEUMOCOCCAL VAC POLYVALENT 25 MCG/0.5ML IJ INJ
0.5000 mL | INJECTION | INTRAMUSCULAR | Status: AC
  Administered 2014-08-15: 0.5 mL via INTRAMUSCULAR
  Filled 2014-08-15: qty 0.5

## 2014-08-15 MED ORDER — OMEPRAZOLE 40 MG PO CPDR: 40.0000 mg | DELAYED_RELEASE_CAPSULE | Freq: Every day | ORAL | Status: AC

## 2014-08-15 MED ORDER — MENINGOCOCCAL VAC A,C,Y,W-135 ~~LOC~~ INJ
0.5000 mL | INJECTION | Freq: Once | SUBCUTANEOUS | Status: AC
  Administered 2014-08-15: 0.5 mL via SUBCUTANEOUS
  Filled 2014-08-15: qty 0.5

## 2014-08-15 NOTE — Progress Notes (Signed)
[place]  [phone] [phone]     ID: Annette Beltran OB: 1946/06/29  MR#: [id number]  HCW#:[id number]  PCP: Annette Showers, MD GYN:   SU: Annette Beltran OTHER MD: Annette Beltran, Annette Beltran  CHIEF COMPLAINT: Early stage pancreatic cancer resected with positive margins  CURRENT TREATMENT: Observation   PANCREATIC CANCER HISTORY: I formerly followed Annette Beltran for a stage I breast cancer, which was treated with surgery, radiation, and anti-estrogens. She was released from followup here in 2007. In addition, in 2012 she had abdominal pain leading to hospitalization. 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 The patient also has a history of collagenous colitis, which of course carries its own set of symptoms, making identification of her new attic problem more difficult.  Annette Beltran tells me she started "feeling bad" in September of 2014. There was abdominal discomfort localizing to the left upper o'clock on, worse with inspiration. CT angiography 08/21/2013 showed no clot and no significant abnormalities. Plain rib and left shoulder views were negative. CT abdomen and pelvis with contrast 08/22/2013 showed slight prominence of the pancreatic duct in the distal body and tail with a new small structure emanating from the tail of the pancreas consistent with a pseudocyst. Exudate near the tail of the pancreas suggested pancreatitis. MRI of the abdomen 09/03/2013 confirmed several ill-defined fluid collections between the gastric fundus and the splenic hilum surrounding the pancreatic tail. There was no evidence of a pancreatic mass. The pancreatic tail did enhanced following contrast. The splenic vein appeared chronically thrombosed with a small amount of  nonocclusive thrombus extending into the main and left portal veins.  On 09/26/2013 amylase was 237 and lipase 513.0, consistent with acute pancreatitis. These numbers subsequently dropped in on 11/28/2013 amylase was normal at 47 and lipase was mildly elevated at 110  With continuing left-sided abdominal discomfort, abdominal ultrasound 11/13/2013 showed multiple gallstones with no evidence of cholecystitis. Evaluation of the pancreas showed a hypoechoic lobulated focus measuring up to 7.4 cm felt to be nonspecific. Repeat abdominal CT 11/14/2013 showed diffuse pancreatic atrophy without evidence of a mass, again consistent with chronic pancreatitis. There was mild soft tissue stranding suspicious for mild superimposed acute pancreatitis. A new rim-enhancing fluid collection was noted in the gastrosplenic ligament measuring 4.5 cm, consistent with a pseudocyst. Finally on 11/23/2013, endoscopic ultrasonography under Annette Beltran showed a subtle 2.5 cm soft tissue mass in the mid-pancreas, abutting the splenic vein. This was biopsied x2, with cytology (NZB [id number]) showing adenocarcinoma. A peripancreatic fluid collection measuring over 5 cm was aspirated yielding 25 cc of chocolate colored fluid. Two or three scattered round hypoechoic lymph nodes were noted near the pancreatic tail but were not sampled.  In addition, on 11/28/2013 a chest x-ray obtained to evaluate upper respiratory symptoms showed a small to moderate left effusion. Chest CT scan 11/29/2013 confirmed a moderate left-sided pleural effusion with no suspicious appearing pulmonary nodules or masses associated with it. The complex cystic lesion associated with the tail of the pancreas was again noted, measuring 5.5 cm despite the recent aspiration procedure.  The patient's subsequent history is as detailed below.  INTERVAL HISTORY: Annette Beltran returns today for followup of her pancreatic cancer accompanied by her husband Annette Beltran. She completed adjuvant  chemotherapy early September. She has made a very nice recovery. She  just had a successful "show" at her home. She is starting a walking program would Annette Beltran, who is himself recovering from abdominal radiation for his sarcoma.  REVIEW OF SYSTEMS: Annette Beltran has some reflux symptoms. She is also concerned about the cost of medications. She is going to explore using the [employer], since they are members there. She is having no change in bowel habits, no abdominal cramps or pain, no nausea or vomiting, and altered taste. Otherwise a detailed review of systems today was entirely benign.  PAST MEDICAL HISTORY: Past Medical History  Diagnosis Date  . Macular degeneration   . Fluid retention   . Colitis, collagenous   . Vitamin D deficiency   . Osteopenia   . Celiac artery stenosis   . Splenic infarction   . Arthritis   . History of blood clots   . Anxiety   . Shingles   . Pancreatitis   . Osteoporosis   . Clotting disorder   . Breast cancer     rt lumpectomy  . Arrhythmia     "skips a beat"  Annette Beltran  heart  . GERD (gastroesophageal reflux disease)     PAST SURGICAL HISTORY: Past Surgical History  Procedure Laterality Date  . Foot surgery  2003    x 3, 2 on right, 1 on left  . Breast lumpectomy Right     radiation for 6 weeks  . Meniscus repair Right   . Eus N/A 11/23/2013    Procedure: UPPER ENDOSCOPIC ULTRASOUND (EUS) LINEAR;  Surgeon: Annette Banister, MD;  Location: [facility];  Service: Endoscopy;  Laterality: N/A;  . Tonsillectomy    . Laparoscopy N/A 12/20/2013    Procedure: LAPAROSCOPY DIAGNOSTIC ;  Surgeon: Annette Klein, MD;  Location: [place];  Service: General;  Laterality: N/A;  . Cholecystectomy N/A 12/20/2013    Procedure: LAPAROSCOPIC CHOLECYSTECTOMY;  Surgeon: Annette Klein, MD;  Location: [place];  Service: General;  Laterality: N/A;  . Splenectomy, total N/A 12/20/2013    Procedure: SPLENECTOMY;  Surgeon: Annette Klein, MD;  Location: [place];  Service: General;   Laterality: N/A;  . Partial gastrectomy N/A 12/20/2013    Procedure: PARTIAL GASTRECTOMY;  Surgeon: Annette Klein, MD;  Location: [place];  Service: General;  Laterality: N/A;  . Portacath placement N/A 01/15/2014    Procedure: INSERTION PORT-A-CATH;  Surgeon: Annette Klein, MD;  Location: [facility];  Service: General;  Laterality: N/A;    FAMILY HISTORY Family History  Problem Relation Age of Onset  . Colon cancer Neg Hx   . Aneurysm Mother   . Stroke Mother   . Prostate cancer Father 62  . Skin cancer Brother 109    treated with interferon for 1 year  . Heart failure Paternal Aunt   . Breast cancer Paternal Aunt     dx over 67s  . Pancreatic cancer Maternal Grandmother     dx in her 63s  . Breast cancer Maternal Aunt     dx over 9  . Rheum arthritis Brother   . Breast cancer Paternal Aunt     dx over 63s  . Leukemia Paternal Aunt    the patient's father had a diagnosis of prostate cancer metastatic to the liver or a died at age 26. The patient's mother died secondary to carotid third brain aneurysm at the age of 75. The patient had 2 brothers, no sisters. One brother has a history of melanoma. The patient's mother is mother was diagnosed with pancreatic cancer in her  70s. There is a history of breast cancer and second degree relatives, none before the age of 48  GYNECOLOGIC HISTORY:  Menarche age 23, first live birth age 30, the patient is [place] P2.. Stopped in her early 77s. 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 Her husband of 11 years, Annette Beltran, retired from [employer] and currently works part-time as a Cabin crew with [employer]. Daughter Annette Beltran teaches first grade. Daughter Annette Beltran is a homemaker. The patient has 4 grandchildren Annette Beltran has an additional 5 grandchildren of his own). The patient attends a [employer]    ADVANCED DIRECTIVES: In  place   HEALTH MAINTENANCE: History  Substance Use Topics  . Smoking status: Former Smoker -- 0.50 packs/day for 28 years    Types: Cigarettes    Quit date: 11/02/2000  . Smokeless tobacco: Never Used  . Alcohol Use: Yes     Comment: occ wine      Colonoscopy:  PAP:  Bone density:  Lipid panel:  Mammography:  Allergies  Allergen Reactions  . Tape     Allergic  To  Tegaderm.  . Codeine Nausea And Vomiting  . Lactose Intolerance (Gi)   . Tequin     Severe stomach pain  . Penicillins Nausea Only and Rash    Current Outpatient Prescriptions  Medication Sig Dispense Refill  . ALPRAZolam (XANAX) 0.5 MG tablet TAKE ONE TABLET TWICE DAILY AS NEEDED FOR ANXIETY  60 tablet  5  . antiseptic oral rinse (BIOTENE) LIQD 15 mLs by Mouth Rinse route as needed for dry mouth.      . Ascorbic Acid (VITAMIN C PO) Take 1 tablet by mouth 2 (two) times daily.      Marland Kitchen aspirin EC 81 MG tablet Take 81 mg by mouth daily.      Marland Kitchen b complex vitamins tablet Take 1 tablet by mouth daily.      . Biotin 1 MG CAPS Take by mouth.      . CREON 12000 UNITS CPEP capsule TAKE TWO CAPSULES THREE TIMES DAILY WITHMEALS  270 capsule  1  . diphenhydrAMINE (BENADRYL) 25 MG tablet Take 25 mg by mouth at bedtime.       . feeding supplement, RESOURCE BREEZE, (RESOURCE BREEZE) LIQD Take 1 Container by mouth 2 (two) times daily between meals.  30 Container  3  . furosemide (LASIX) 20 MG tablet Take 20 mg by mouth daily as needed for fluid.      Marland Kitchen lidocaine-prilocaine (EMLA) cream Apply 1-2 hours to port a cath before procedure.  30 g  6  . loratadine (CLARITIN) 10 MG tablet Take 10 mg by mouth daily.      . meclizine (ANTIVERT) 25 MG tablet Take 1 tablet (25 mg total) by mouth 3 (three) times daily as needed for dizziness.  90 tablet  1  . Naproxen Sodium (ALEVE) 220 MG CAPS Take 440 mg by mouth 2 (two) times daily as needed (Pain).      . ondansetron (ZOFRAN) 8 MG tablet Take 1 tablet (8 mg total) by mouth 2 (two) times  daily as needed (Nausea or vomiting).  30 tablet  1  . PARoxetine (PAXIL) 10 MG tablet Take 10 mg by mouth every morning.      Marland Kitchen PARoxetine (PAXIL) 10 MG tablet TAKE ONE TABLET BY MOUTH ONCE DAILY  30 tablet  7  . prochlorperazine (COMPAZINE) 10 MG tablet Take 1 tablet (10 mg total) by mouth every 6 (six) hours as needed (Nausea or vomiting).  30 tablet  1  . valACYclovir (VALTREX) 1000 MG tablet Take 1 tablet (1,000 mg total) by mouth 2 (two) times daily.  90 tablet  1   No current facility-administered medications for this visit.    OBJECTIVE: Middle-aged white woman who appears stated age 68 Vitals:   08/15/14 0915  BP: 426/55  Pulse: 45  Temp: 98.2 F (36.8 C)  Resp: 18     Body mass index is 27.38 kg/(m^2).    ECOG FS:0 - Asymptomatic  Sclerae unicteric, EOMs intact Oropharynx clear and slightly dry No cervical or supraclavicular adenopathy Lungs no rales or rhonchi Heart regular rate and rhythm Abd soft, nontender, positive bowel sounds; no masses palpated MSK no focal spinal tenderness, no upper extremity lymphedema Neuro: nonfocal, well oriented, positive affect Breasts: Deferred    LAB RESULTS:  CMP     Component Value Date/Time   NA 140 07/10/2014 0856   NA 139 06/01/2014 1155   K 4.4 07/10/2014 0856   K 4.6 06/01/2014 1155   CL 100 06/01/2014 1155   CO2 25 07/10/2014 0856   CO2 27 06/01/2014 1155   GLUCOSE 154* 07/10/2014 0856   GLUCOSE 101* 06/01/2014 1155   BUN 16.3 07/10/2014 0856   BUN 13 06/01/2014 1155   CREATININE 0.9 07/10/2014 0856   CREATININE 0.59 06/01/2014 1155   CREATININE 0.62 11/28/2013 1635   CALCIUM 8.8 07/10/2014 0856   CALCIUM 9.5 06/01/2014 1155   PROT 6.7 07/10/2014 0856   PROT 5.0* 12/21/2013 0450   ALBUMIN 3.3* 07/10/2014 0856   ALBUMIN 2.3* 12/21/2013 0450   AST 36* 07/10/2014 0856   AST 42* 12/21/2013 0450   ALT 33 07/10/2014 0856   ALT 25 12/21/2013 0450   ALKPHOS 76 07/10/2014 0856   ALKPHOS 46 12/21/2013 0450   BILITOT 0.37 07/10/2014 0856   BILITOT  0.7 12/21/2013 0450   GFRNONAA >90 06/01/2014 1155   GFRAA >90 06/01/2014 1155    I No results found for this basename: SPEP,  UPEP,   kappa and lambda light chains    Lab Results  Component Value Date   WBC 8.1 08/15/2014   NEUTROABS 4.7 08/15/2014   HGB 13.8 08/15/2014   HCT 41.1 08/15/2014   MCV 101.7* 08/15/2014   PLT 299 08/15/2014      Chemistry      Component Value Date/Time   NA 140 07/10/2014 0856   NA 139 06/01/2014 1155   K 4.4 07/10/2014 0856   K 4.6 06/01/2014 1155   CL 100 06/01/2014 1155   CO2 25 07/10/2014 0856   CO2 27 06/01/2014 1155   BUN 16.3 07/10/2014 0856   BUN 13 06/01/2014 1155   CREATININE 0.9 07/10/2014 0856   CREATININE 0.59 06/01/2014 1155   CREATININE 0.62 11/28/2013 1635      Component Value Date/Time   CALCIUM 8.8 07/10/2014 0856   CALCIUM 9.5 06/01/2014 1155   ALKPHOS 76 07/10/2014 0856   ALKPHOS 46 12/21/2013 0450   AST 36* 07/10/2014 0856   AST 42* 12/21/2013 0450   ALT 33 07/10/2014 0856   ALT 25 12/21/2013 0450   BILITOT 0.37 07/10/2014 0856   BILITOT 0.7 12/21/2013 0450    Results for ANDE, THERRELL (MRN [id number]) as of 08/15/2014 09:09  Ref. Range 05/14/2014 08:21 05/21/2014 08:22 05/28/2014 09:23 06/04/2014 08:14 07/10/2014 08:56  CA  19-9 Latest Range: <35.0 U/mL 28.8 23.9 21.0 25.1 22.4    Lab Results  Component Value Date   LABCA2 8 06/08/2011    No components found with this basename: [id number]    No results found for this basename: INR,  in the last 168 hours  Urinalysis    Component Value Date/Time   COLORURINE YELLOW 06/07/2011 1942   APPEARANCEUR CLEAR 06/07/2011 1942   LABSPEC 1.010 02/15/2014 1109   LABSPEC 1.009 06/07/2011 1942   PHURINE 6.0 06/07/2011 1942   GLUCOSEU Negative 02/15/2014 [place] 06/07/2011 1942   HGBUR NEGATIVE 06/07/2011 1942   BILIRUBINUR neg 12/25/2011 [place] 06/07/2011 1942   KETONESUR NEGATIVE 06/07/2011 1942   PROTEINUR neg 12/25/2011 1216   PROTEINUR NEGATIVE 06/07/2011 1942   UROBILINOGEN  0.2 02/15/2014 1109   UROBILINOGEN negative 12/25/2011 1216   UROBILINOGEN 0.2 06/07/2011 1942   NITRITE neg 12/25/2011 1216   NITRITE NEGATIVE 06/07/2011 1942   LEUKOCYTESUR Negative 12/25/2011 1216    STUDIES: Ct Chest W Contrast  08/13/2014   CLINICAL DATA:  Pancreatic cancer diagnosed 11/2013, status post Whipple procedure, chemotherapy complete, for restaging. History of right breast cancer status post chemotherapy/XRT.  EXAM: CT CHEST, ABDOMEN, AND PELVIS WITH CONTRAST  TECHNIQUE: Multidetector CT imaging of the chest, abdomen and pelvis was performed following the standard protocol during bolus administration of intravenous contrast.  CONTRAST:  148m OMNIPAQUE IOHEXOL 300 MG/ML  SOLN  COMPARISON:  CT chest dated 11/29/2013. CT abdomen pelvis dated 11/14/2013.  FINDINGS: CT CHEST FINDINGS  No suspicious pulmonary nodules. Small right and trace left pleural effusions. Mild centrilobular emphysematous changes. Mild right apical pleural parenchymal scarring. No pneumothorax.  Visualized thyroid is unremarkable.  Heart is top-normal in size. No pericardial effusion. Mild atherosclerotic calcifications of the aortic arch.  Small mediastinal lymph nodes which do not meet pathologic CT size criteria.  Left chest port terminating at the cavoatrial junction.  Postsurgical changes in the lateral right breast.  Mild degenerative changes in the thoracic spine.  CT ABDOMEN AND PELVIS FINDINGS  Postsurgical changes related to Whipple procedure.  Hepatobiliary: Mild atrophy of the left hepatic lobe. Liver is otherwise within normal limits. No suspicious hepatic lesions.  Status post cholecystectomy.  Pancreas: Residual pancreatic head/ uncinate process is within normal limits. Abnormal, nonenhancing soft tissue at the pancreatic resection margin measures approximately 1.8 x 1.1 cm (series 3/ image 58), favored reflect postsurgical changes, although attention on follow-up is suggested.  Spleen: Surgically absent.   Adrenals/Urinary Tract: 8 mm left adrenal nodule (series 3/ image 53), similar. Right adrenal gland is within normal limits.  Kidneys are within normal limits. Bilateral extrarenal pelvis. No hydronephrosis.  Bladder is within normal limits.  Stomach/Bowel: Postsurgical changes involving the gastric cardia (series 2/image 14). Mild wall thickening/mucosal edema along the distal gastric antrum.  No evidence of bowel obstruction.  Normal appendix.  Vascular/Lymphatic: Atherosclerotic calcifications of the abdominal aorta and branch vessels.  5 mm short axis left para-aortic node (series 3/ image 54). No suspicious abdominopelvic lymphadenopathy.  Reproductive: Uterus is unremarkable.  Bilateral ovaries are within normal limits.  Other: No abdominopelvic ascites.  Musculoskeletal: Mild degenerative changes of the lumbar spine.  IMPRESSION: Status post Whipple procedure.  Abnormal soft tissue along the pancreatic resection margin, nonenhancing, favored to reflect postsurgical changes. Attention on follow-up is suggested.  No definite findings to suggest metastatic disease.  Additional ancillary findings as above.   Electronically Signed   By: SJulian Hy  M.D.   On: 08/13/2014 12:14   Ct Abdomen Pelvis W Contrast  08/13/2014   CLINICAL DATA:  Pancreatic cancer diagnosed 11/2013, status post Whipple procedure, chemotherapy complete, for restaging. History of right breast cancer status post chemotherapy/XRT.  EXAM: CT CHEST, ABDOMEN, AND PELVIS WITH CONTRAST  TECHNIQUE: Multidetector CT imaging of the chest, abdomen and pelvis was performed following the standard protocol during bolus administration of intravenous contrast.  CONTRAST:  171m OMNIPAQUE IOHEXOL 300 MG/ML  SOLN  COMPARISON:  CT chest dated 11/29/2013. CT abdomen pelvis dated 11/14/2013.  FINDINGS: CT CHEST FINDINGS  No suspicious pulmonary nodules. Small right and trace left pleural effusions. Mild centrilobular emphysematous changes. Mild right  apical pleural parenchymal scarring. No pneumothorax.  Visualized thyroid is unremarkable.  Heart is top-normal in size. No pericardial effusion. Mild atherosclerotic calcifications of the aortic arch.  Small mediastinal lymph nodes which do not meet pathologic CT size criteria.  Left chest port terminating at the cavoatrial junction.  Postsurgical changes in the lateral right breast.  Mild degenerative changes in the thoracic spine.  CT ABDOMEN AND PELVIS FINDINGS  Postsurgical changes related to Whipple procedure.  Hepatobiliary: Mild atrophy of the left hepatic lobe. Liver is otherwise within normal limits. No suspicious hepatic lesions.  Status post cholecystectomy.  Pancreas: Residual pancreatic head/ uncinate process is within normal limits. Abnormal, nonenhancing soft tissue at the pancreatic resection margin measures approximately 1.8 x 1.1 cm (series 3/ image 58), favored reflect postsurgical changes, although attention on follow-up is suggested.  Spleen: Surgically absent.  Adrenals/Urinary Tract: 8 mm left adrenal nodule (series 3/ image 53), similar. Right adrenal gland is within normal limits.  Kidneys are within normal limits. Bilateral extrarenal pelvis. No hydronephrosis.  Bladder is within normal limits.  Stomach/Bowel: Postsurgical changes involving the gastric cardia (series 2/image 14). Mild wall thickening/mucosal edema along the distal gastric antrum.  No evidence of bowel obstruction.  Normal appendix.  Vascular/Lymphatic: Atherosclerotic calcifications of the abdominal aorta and branch vessels.  5 mm short axis left para-aortic node (series 3/ image 54). No suspicious abdominopelvic lymphadenopathy.  Reproductive: Uterus is unremarkable.  Bilateral ovaries are within normal limits.  Other: No abdominopelvic ascites.  Musculoskeletal: Mild degenerative changes of the lumbar spine.  IMPRESSION: Status post Whipple procedure.  Abnormal soft tissue along the pancreatic resection margin,  nonenhancing, favored to reflect postsurgical changes. Attention on follow-up is suggested.  No definite findings to suggest metastatic disease.  Additional ancillary findings as above.   Electronically Signed   By: SJulian HyM.D.   On: 08/13/2014 12:14    ASSESSMENT: 68y.o. [place] woman  BREAST CANCER: (1) status post right lumpectomy 04/22/2001 for a pT1c pN0, stage IA invasive ductal carcinoma, grade 1, estrogen receptor 94% positive, progesterone receptor 97% positive, HER-2 not amplified  (a) status post radiation therapy to the right breast  (b) on aromatase inhibitors between 2002 and 2007  (2) history of splenic infarct August 2012 felt to be related to narrowing of the celiac and splenic arteries with post stenotic dilatation of the splenic artery, in turn felt to be secondary to recurrent pancreatitis in the setting of moderate to high EtOH use; 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 noted on abdominal MRI November 2014  PANCREATIC CANCER: (3) status post endoscopic ultrasonography of the pancreas 11/23/2013 with cytology positive for adenocarcinoma, clinical stage T2 NX MX adenocarcinoma associated with a 5.5 cm pseudocyst (which was aspirated); baseline CA 19-9 was 47.0  (  4) left pleural effusion status post thoracentesis 12/01/2013, cytology negative  (5) s/p laparoscopic cholecystectomy, open extended distal pancreatectomy, splenectomy, partial gastrectomy x2 12/20/2013 for a pT1 pN0, stage IA invasive ductal adenocarcinoma, grade 2, with positive resection margins  (a) "triple vaccination"(meningococcus, pneumococcus, Haemophilus) given 08/15/2014  (6) adjuvant gemcitabine started 02/01/2014-- received 3 weekly doses before radiation, will reeive 9 additional doses starting 3-5 weeks after completion of radiation  (7) adjuvant radiation started 02/27/2014, with radiosensitization (capecitabine 1000 mg po BID on  radiation days); completed 04/06/2014  (8) adjuvant gemcitabine weekly resumed 05/07/2014,  completed 9 doses 07/10/2014  PLAN: Annette Beltran has recovered very nicely from her chemotherapy and radiation. She has essentially a normal functional status at this point. CT scans do not show evidence of disease recurrence.  We will have to follow closely over the next year. She does not like MRIs, which would be my preference. She will have a CT of the abdomen and pelvis and a chest x-ray in 3 months and she will see me a few days after that. We will repeat those tests 6 months later and then again in 6 months later. I will continue to see her on an every 3 month basis.  Today we discussed the need for vaccination against encapsulated organisms in patients with splenectomy. She understands that if she ever develops a temperature greater than 100 she needs to call immediately and if there is not a doctor available she needs to go to an emergency room within 2 hours.  I redirected her Creon prescription and added a Prilosec prescription to [facility].  Aarionna has a good understanding of the overall plan. She agrees with it. She knows the goal of treatment in her case is cure. She will call with any problems that may develop before her next visit.  Chauncey Cruel, MD   08/15/2014 9:56 AM

## 2014-08-15 NOTE — Telephone Encounter (Signed)
, °

## 2014-08-16 ENCOUNTER — Telehealth: Payer: Self-pay | Admitting: *Deleted

## 2014-08-16 ENCOUNTER — Other Ambulatory Visit: Payer: Self-pay | Admitting: *Deleted

## 2014-08-16 NOTE — Telephone Encounter (Signed)
This RN spoke with pt per her call stating she has a fever of 100.7 post injections yestereday " and he told me I had to call if I ever developed a temperature greater than 100 "  Per review pt states she did obtain several vaccines yesterday in her left arm.   Post fever she took ibuprofen " and my fever broke ".  She states she feels overall achy and " my left arm hurts where I got all the injections"  She denies any pain with urination, diarrhea or respiratory issues.  Above reviewed with MD- and per his recommendation she is to monitor temp- continue use of ibuprofen or tylenol.  Fever at present is most likely related to vaccines - but if she develops any of the above concerns she is to call.  Discussed with pt with verbalized understanding.

## 2014-08-20 ENCOUNTER — Encounter: Payer: Self-pay | Admitting: *Deleted

## 2014-08-20 ENCOUNTER — Telehealth: Payer: Self-pay | Admitting: *Deleted

## 2014-08-20 NOTE — Telephone Encounter (Signed)
Patient called to confirm that we were aware of her left arm swelling after receiving injections. Patient is taking Keflex 500 mg BID and states that arm has improved, less red,less pain and less swelling. Patient advised to call if symptoms do not continue to improve. Patient verbalized understanding.

## 2014-08-20 NOTE — Progress Notes (Signed)
Received notes from call-a-nurse. Sent to scan,

## 2014-09-05 ENCOUNTER — Encounter: Payer: Self-pay | Admitting: Internal Medicine

## 2014-09-12 ENCOUNTER — Other Ambulatory Visit: Payer: Self-pay | Admitting: *Deleted

## 2014-09-28 ENCOUNTER — Other Ambulatory Visit: Payer: Self-pay | Admitting: Oncology

## 2014-09-28 DIAGNOSIS — C251 Malignant neoplasm of body of pancreas: Secondary | ICD-10-CM

## 2014-10-10 ENCOUNTER — Telehealth: Payer: Self-pay | Admitting: Oncology

## 2014-10-10 ENCOUNTER — Other Ambulatory Visit: Payer: Self-pay | Admitting: *Deleted

## 2014-10-10 DIAGNOSIS — C50911 Malignant neoplasm of unspecified site of right female breast: Secondary | ICD-10-CM

## 2014-10-10 NOTE — Telephone Encounter (Signed)
s.w. pt husband and advised on port removal....ok and aware

## 2014-10-11 ENCOUNTER — Encounter: Payer: Self-pay | Admitting: Internal Medicine

## 2014-10-17 ENCOUNTER — Encounter: Payer: Self-pay | Admitting: Internal Medicine

## 2014-10-19 ENCOUNTER — Other Ambulatory Visit: Payer: Self-pay | Admitting: Radiology

## 2014-10-22 ENCOUNTER — Inpatient Hospital Stay (HOSPITAL_COMMUNITY): Admission: RE | Admit: 2014-10-22 | Payer: Medicare Other | Source: Ambulatory Visit

## 2014-10-22 ENCOUNTER — Ambulatory Visit (HOSPITAL_COMMUNITY): Admission: RE | Admit: 2014-10-22 | Payer: Medicare Other | Source: Ambulatory Visit

## 2014-10-23 IMAGING — CT CT ABD-PELV W/ CM
2 of 10 series · 12 of 46 positions shown, 18 images · IV contrast (OMNIPAQUE)
Comparison: CT chest dated 11/29/2013. CT abdomen pelvis dated
11/14/2013.

CLINICAL DATA: Pancreatic cancer diagnosed [DATE], status post
Whipple procedure, chemotherapy complete, for restaging. History of
right breast cancer status post chemotherapy/XRT.

EXAM:
CT CHEST, ABDOMEN, AND PELVIS WITH CONTRAST
TECHNIQUE: Multidetector CT imaging of the chest, abdomen and pelvis was
performed following the standard protocol during bolus
administration of intravenous contrast.
CONTRAST:  100mL OMNIPAQUE IOHEXOL 300 MG/ML  SOLN

[Series 3: cap with st · axial · 0.80mm/px · z∈[-461,+79]mm · 10 of 126 slices shown, 15 images]
[im 9/126  soft-tissue]
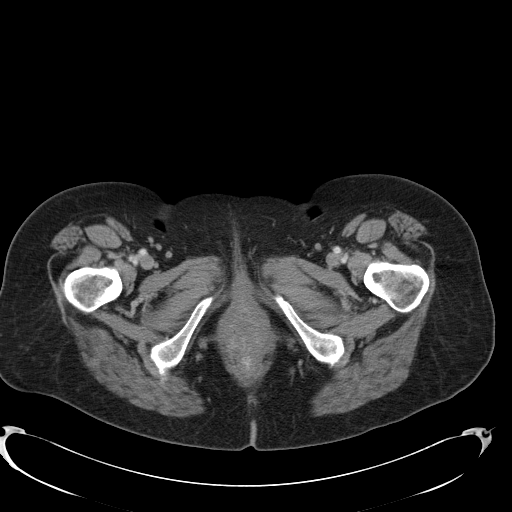
[im 9/126  bone]
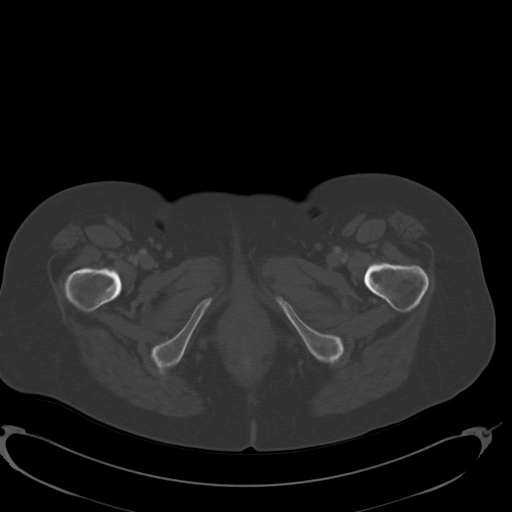
[im 27/126  soft-tissue]
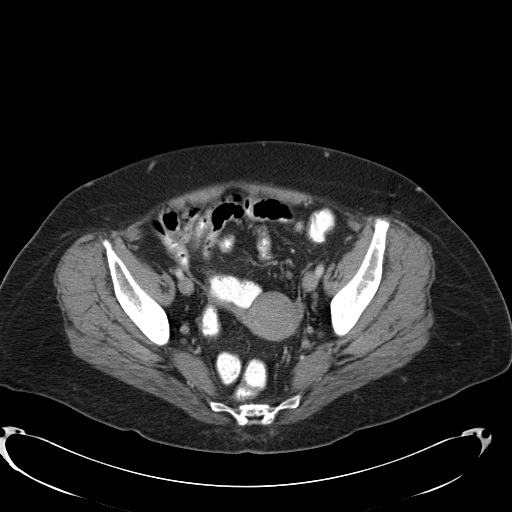
[im 36/126  soft-tissue]
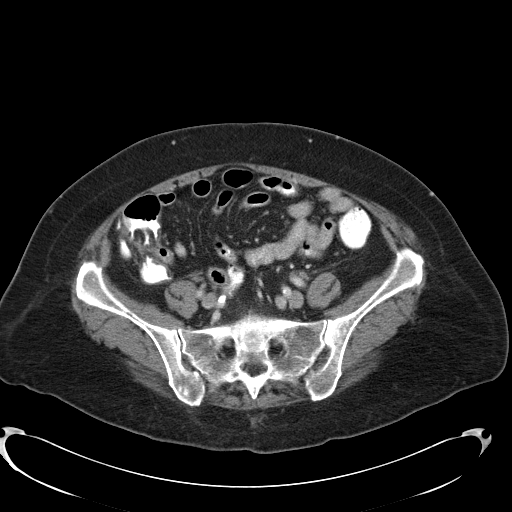
[im 54/126  soft-tissue]
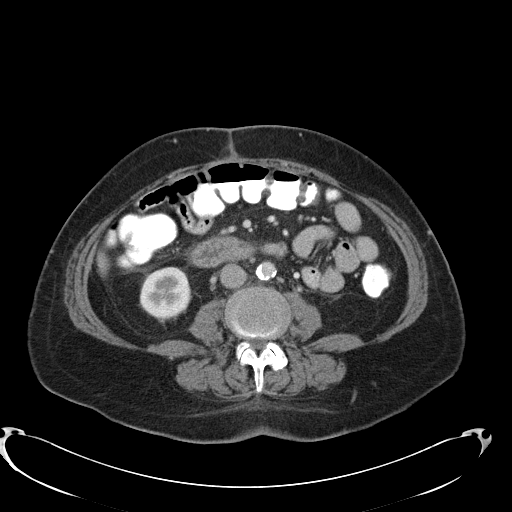
[im 63/126  soft-tissue]
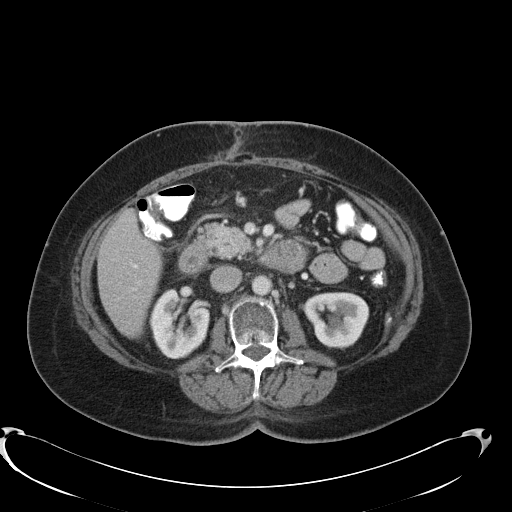
[im 72/126  soft-tissue]
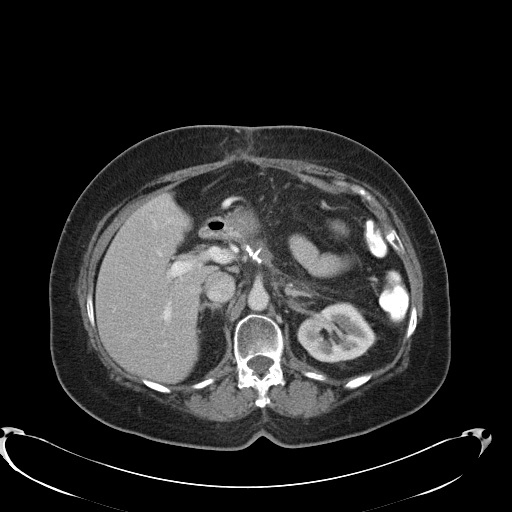
[im 90/126  soft-tissue]
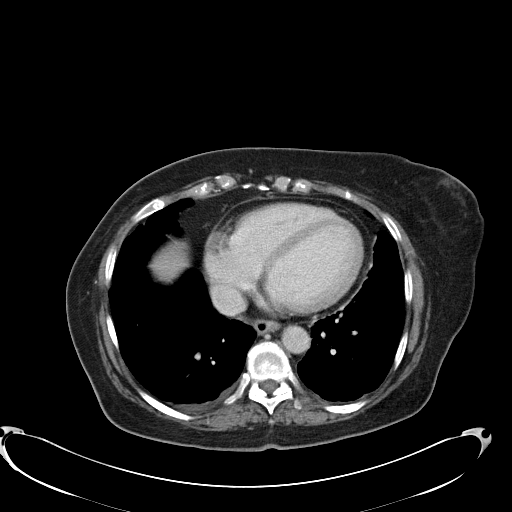
[im 90/126  lung]
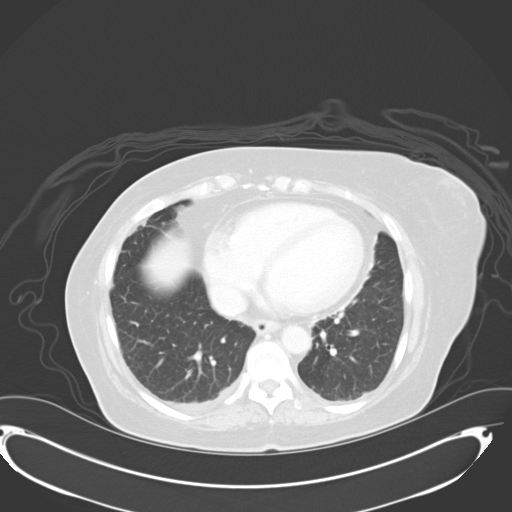
[im 99/126  soft-tissue]
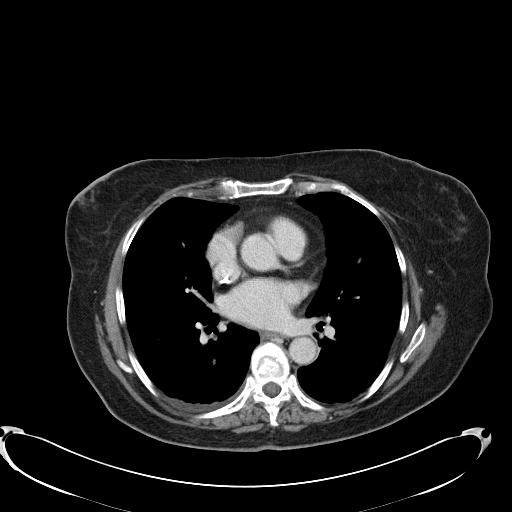
[im 99/126  lung]
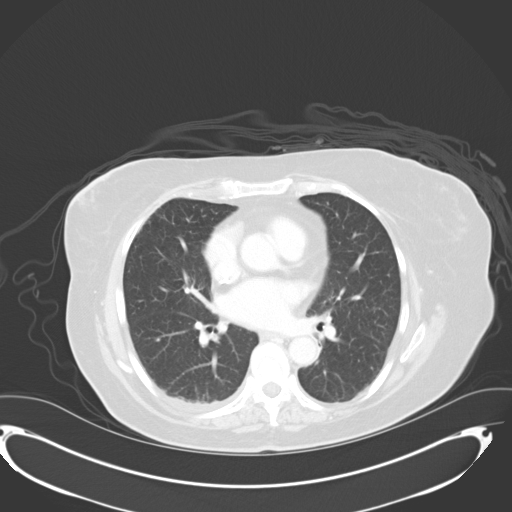
[im 108/126  lung]
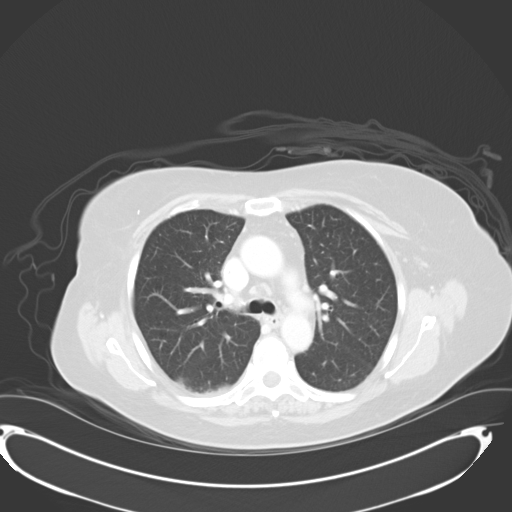
[im 117/126  soft-tissue]
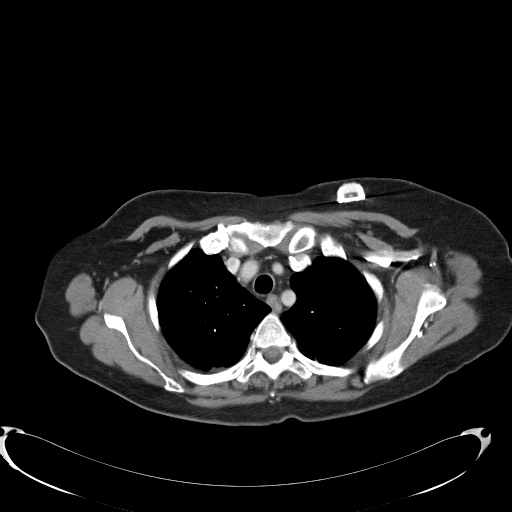
[im 117/126  lung]
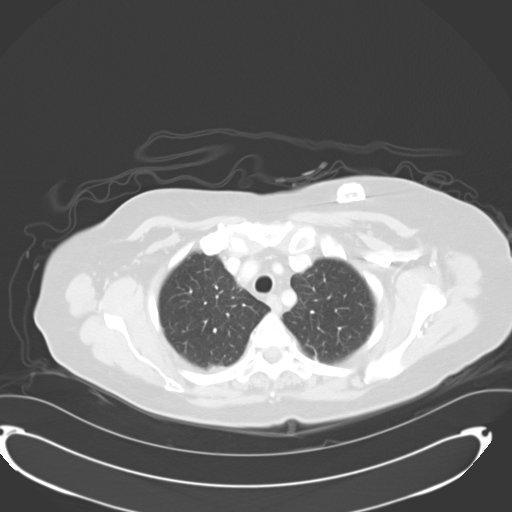
[im 117/126  bone]
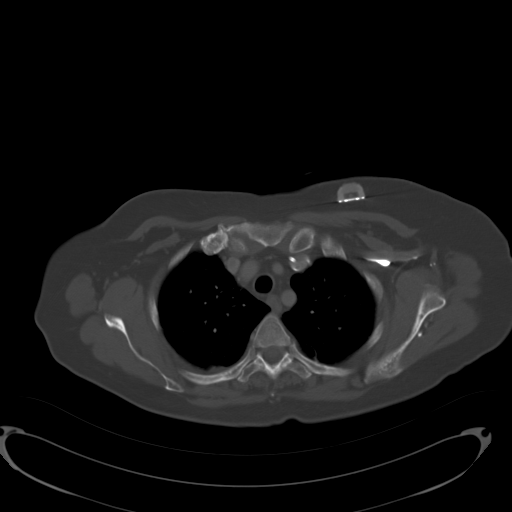

[Series 602: <mpr thick range> · coronal · 0.80mm/px · 2 of 87 slices shown, 3 images]
[im 29/87  soft-tissue]
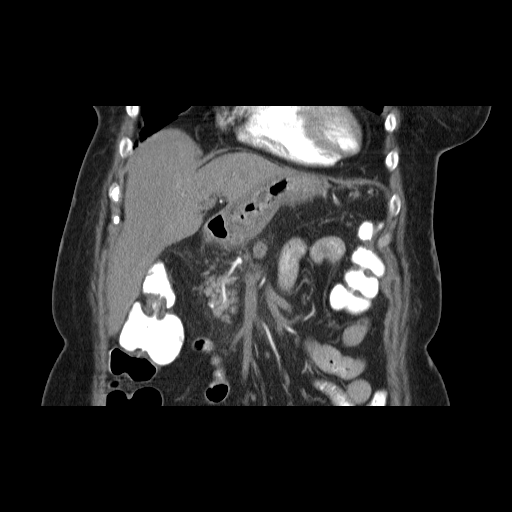
[im 29/87  bone]
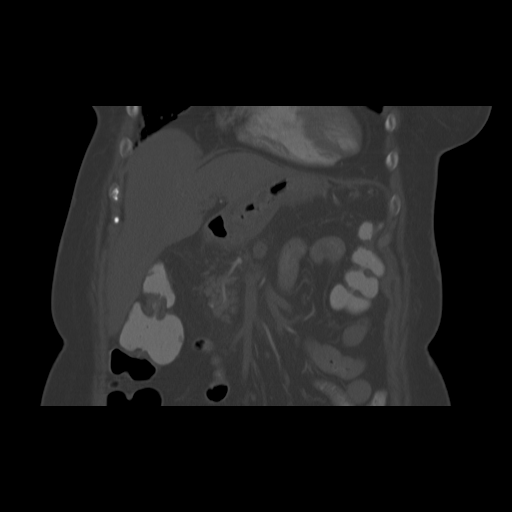
[im 58/87  soft-tissue]
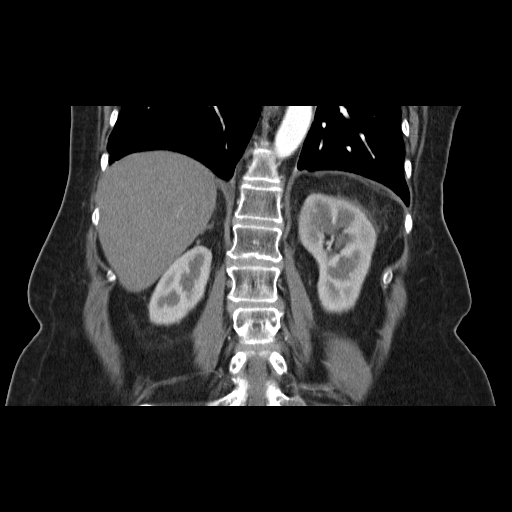

[12 of 46 positions shown; findings below may reference images not displayed]

FINDINGS: CT CHEST FINDINGS

No suspicious pulmonary nodules. Small right and trace left pleural
effusions. Mild centrilobular emphysematous changes. Mild right
apical pleural parenchymal scarring. No pneumothorax.

Visualized thyroid is unremarkable.

Heart is top-normal in size. No pericardial effusion. Mild
atherosclerotic calcifications of the aortic arch.

Small mediastinal lymph nodes which do not meet pathologic CT size
criteria.

Left chest port terminating at the cavoatrial junction.

Postsurgical changes in the lateral right breast.

Mild degenerative changes in the thoracic spine.

CT ABDOMEN AND PELVIS FINDINGS

Postsurgical changes related to Whipple procedure.

Hepatobiliary: Mild atrophy of the left hepatic lobe. Liver is
otherwise within normal limits. No suspicious hepatic lesions.

Status post cholecystectomy.

Pancreas: Residual pancreatic head/ uncinate process is within
normal limits. Abnormal, nonenhancing soft tissue at the pancreatic
resection margin measures approximately 1.8 x 1.1 cm (series 3/
image 58), favored reflect postsurgical changes, although attention
on follow-up is suggested.

Spleen: Surgically absent.

Adrenals/Urinary Tract: 8 mm left adrenal nodule (series 3/ image
53), similar. Right adrenal gland is within normal limits.

Kidneys are within normal limits. Bilateral extrarenal pelvis. No
hydronephrosis.

Bladder is within normal limits.

Stomach/Bowel: Postsurgical changes involving the gastric cardia
(series 2/image 14). Mild wall thickening/mucosal edema along the
distal gastric antrum.

No evidence of bowel obstruction.  Normal appendix.

Vascular/Lymphatic: Atherosclerotic calcifications of the abdominal
aorta and branch vessels.

5 mm short axis left para-aortic node (series 3/ image 54). No
suspicious abdominopelvic lymphadenopathy.

Reproductive: Uterus is unremarkable.

Bilateral ovaries are within normal limits.

Other: No abdominopelvic ascites.

Musculoskeletal: Mild degenerative changes of the lumbar spine.
IMPRESSION: Status post Whipple procedure.

Abnormal soft tissue along the pancreatic resection margin,
nonenhancing, favored to reflect postsurgical changes. Attention on
follow-up is suggested.

No definite findings to suggest metastatic disease.

Additional ancillary findings as above.

## 2014-10-29 ENCOUNTER — Other Ambulatory Visit: Payer: Self-pay | Admitting: Radiology

## 2014-10-30 ENCOUNTER — Ambulatory Visit (HOSPITAL_COMMUNITY)
Admission: RE | Admit: 2014-10-30 | Discharge: 2014-10-30 | Disposition: A | Discharge status: Home / Self Care | Payer: Medicare Other | Source: Ambulatory Visit | Attending: Oncology | Admitting: Oncology

## 2014-10-30 ENCOUNTER — Encounter (HOSPITAL_COMMUNITY): Payer: Self-pay

## 2014-10-30 DIAGNOSIS — M8580 Other specified disorders of bone density and structure, unspecified site: Secondary | ICD-10-CM | POA: Insufficient documentation

## 2014-10-30 DIAGNOSIS — M810 Age-related osteoporosis without current pathological fracture: Secondary | ICD-10-CM | POA: Insufficient documentation

## 2014-10-30 DIAGNOSIS — Z87891 Personal history of nicotine dependence: Secondary | ICD-10-CM | POA: Diagnosis not present

## 2014-10-30 DIAGNOSIS — Z452 Encounter for adjustment and management of vascular access device: Secondary | ICD-10-CM | POA: Insufficient documentation

## 2014-10-30 DIAGNOSIS — Z853 Personal history of malignant neoplasm of breast: Secondary | ICD-10-CM | POA: Diagnosis not present

## 2014-10-30 DIAGNOSIS — Z79899 Other long term (current) drug therapy: Secondary | ICD-10-CM | POA: Insufficient documentation

## 2014-10-30 DIAGNOSIS — Z91048 Other nonmedicinal substance allergy status: Secondary | ICD-10-CM | POA: Insufficient documentation

## 2014-10-30 DIAGNOSIS — Z888 Allergy status to other drugs, medicaments and biological substances status: Secondary | ICD-10-CM | POA: Diagnosis not present

## 2014-10-30 DIAGNOSIS — Z880 Allergy status to penicillin: Secondary | ICD-10-CM | POA: Diagnosis not present

## 2014-10-30 DIAGNOSIS — C50911 Malignant neoplasm of unspecified site of right female breast: Secondary | ICD-10-CM

## 2014-10-30 DIAGNOSIS — Z886 Allergy status to analgesic agent status: Secondary | ICD-10-CM | POA: Diagnosis not present

## 2014-10-30 DIAGNOSIS — Z7982 Long term (current) use of aspirin: Secondary | ICD-10-CM | POA: Insufficient documentation

## 2014-10-30 DIAGNOSIS — K219 Gastro-esophageal reflux disease without esophagitis: Secondary | ICD-10-CM | POA: Insufficient documentation

## 2014-10-30 DIAGNOSIS — M1990 Unspecified osteoarthritis, unspecified site: Secondary | ICD-10-CM | POA: Diagnosis not present

## 2014-10-30 DIAGNOSIS — C259 Malignant neoplasm of pancreas, unspecified: Secondary | ICD-10-CM | POA: Insufficient documentation

## 2014-10-30 LAB — CBC
HEMATOCRIT: 40.1 % (ref 36.0–46.0)
HEMOGLOBIN: 14.5 g/dL (ref 12.0–15.0)
MCH: 31.8 pg (ref 26.0–34.0)
MCHC: 36.2 g/dL — ABNORMAL HIGH (ref 30.0–36.0)
MCV: 87.9 fL (ref 78.0–100.0)
Platelets: 292 10*3/uL (ref 150–400)
RBC: 4.56 MIL/uL (ref 3.87–5.11)
RDW: 14.4 % (ref 11.5–15.5)
WBC: 6.6 10*3/uL (ref 4.0–10.5)

## 2014-10-30 LAB — PROTIME-INR
INR: 0.98 (ref 0.00–1.49)
PROTHROMBIN TIME: 13.1 s (ref 11.6–15.2)

## 2014-10-30 MED ORDER — MIDAZOLAM HCL 2 MG/2ML IJ SOLN
INTRAMUSCULAR | Status: AC | PRN
  Administered 2014-10-30: 2 mg via INTRAVENOUS

## 2014-10-30 MED ORDER — LIDOCAINE HCL 1 % IJ SOLN
INTRAMUSCULAR | Status: AC
  Filled 2014-10-30: qty 20

## 2014-10-30 MED ORDER — MIDAZOLAM HCL 2 MG/2ML IJ SOLN
INTRAMUSCULAR | Status: AC
  Filled 2014-10-30: qty 2

## 2014-10-30 MED ORDER — FENTANYL CITRATE 0.05 MG/ML IJ SOLN
INTRAMUSCULAR | Status: AC
  Filled 2014-10-30: qty 2

## 2014-10-30 MED ORDER — FENTANYL CITRATE 0.05 MG/ML IJ SOLN
INTRAMUSCULAR | Status: AC | PRN
  Administered 2014-10-30: 100 ug via INTRAVENOUS

## 2014-10-30 MED ORDER — SODIUM CHLORIDE 0.9 % IV SOLN
INTRAVENOUS | Status: DC
  Administered 2014-10-30: 13:00:00 via INTRAVENOUS

## 2014-10-30 MED ORDER — HYDROCODONE-ACETAMINOPHEN 5-325 MG PO TABS
1.0000 | ORAL_TABLET | ORAL | Status: DC | PRN
Start: 2014-10-30 — End: 2014-10-31

## 2014-10-30 MED ORDER — DIPHENHYDRAMINE HCL 50 MG/ML IJ SOLN
INTRAMUSCULAR | Status: AC
  Administered 2014-10-30: 25 mg via INTRAVENOUS
  Filled 2014-10-30: qty 1

## 2014-10-30 MED ORDER — VANCOMYCIN HCL IN DEXTROSE 1-5 GM/200ML-% IV SOLN
1000.0000 mg | Freq: Once | INTRAVENOUS | Status: AC
  Administered 2014-10-30: 1000 mg via INTRAVENOUS
  Filled 2014-10-30: qty 200

## 2014-10-30 MED ORDER — DIPHENHYDRAMINE HCL 50 MG/ML IJ SOLN
25.0000 mg | Freq: Two times a day (BID) | INTRAMUSCULAR | Status: DC | PRN
  Administered 2014-10-30: 25 mg via INTRAVENOUS

## 2014-10-30 NOTE — Progress Notes (Signed)
Benadryl given, vancomycin begun. Redness and whelps began immediately afte vanco restarted.  Vanco stopped , saline to flush. Dr Vernard Gambles was in to see patient

## 2014-10-30 NOTE — Discharge Instructions (Signed)
Incision Care  An incision is a surgical cut to open your skin. You need to take care of your incision. This helps you to not get an infection. HOME CARE  Only take medicine as told by your doctor.  Do not take off your bandage (dressing) or get your incision wet until tomorrow . May shower at that time.  Take showers. Do not take baths, swim, or do anything that may soak your incision until it heals.  Return to your normal diet and activities as told or allowed by your doctor.  Avoid lifting any weight until your doctor approves.  Put medicine that helps lessen itching on your incision as told by your doctor. Do not pick or scratch at your incision.  Keep your doctor visit to have your stitches (sutures) or staples removed.  Drink enough fluids to keep your pee (urine) clear or pale yellow. GET HELP RIGHT AWAY IF:  You have a fever.  You have a rash.  You are dizzy, or you pass out (faint) while standing.  You have trouble breathing.  You have a reaction or side effects to medicine given to you.  You have redness, puffiness (swelling), or more pain in the incision and medicine does not help.  You have fluid, blood, or yellowish-white fluid (pus) coming from the incision lasting over 1 day.  You have muscle aches, chills, or you feel sick.  You have a bad smell coming from the incision or bandage.  Your incision opens up after stitches, staples, or sticky strips have been removed.  You keep feeling sick to your stomach (nauseous) or keep throwing up (vomiting). MAKE SURE YOU:   Understand these instructions.  Will watch your condition.  Will get help right away if you are not doing well or get worse. Document Released: 01/11/2012 Document Reviewed: 12/13/2013 [facility] Patient Information 2015 [place]. This information is not intended to replace advice given to you by your health care provider. Make sure you discuss any questions you have with your health care  provider. Conscious Sedation, Adult, Care After Refer to this sheet in the next few weeks. These instructions provide you with information on caring for yourself after your procedure. Your health care provider may also give you more specific instructions. Your treatment has been planned according to current medical practices, but problems sometimes occur. Call your health care provider if you have any problems or questions after your procedure. WHAT TO EXPECT AFTER THE PROCEDURE  After your procedure:  You may feel sleepy, clumsy, and have poor balance for several hours.  Vomiting may occur if you eat too soon after the procedure. HOME CARE INSTRUCTIONS  Do not participate in any activities where you could become injured for at least 24 hours. Do not:  Drive.  Swim.  Ride a bicycle.  Operate heavy machinery.  Cook.  Use power tools.  Climb ladders.  Work from a high place.  Do not make important decisions or sign legal documents until you are improved.  If you vomit, drink water, juice, or soup when you can drink without vomiting. Make sure you have little or no nausea before eating solid foods.  Only take over-the-counter or prescription medicines for pain, discomfort, or fever as directed by your health care provider.  Make sure you and your family fully understand everything about the medicines given to you, including what side effects may occur.  You should not drink alcohol, take sleeping pills, or take medicines that cause drowsiness for  at least 24 hours.  If you smoke, do not smoke without supervision.  If you are feeling better, you may resume normal activities 24 hours after you were sedated.  Keep all appointments with your health care provider. SEEK MEDICAL CARE IF:  Your skin is pale or bluish in color.  You continue to feel nauseous or vomit.  Your pain is getting worse and is not helped by medicine.  You have bleeding or swelling.  You are still  sleepy or feeling clumsy after 24 hours. SEEK IMMEDIATE MEDICAL CARE IF:  You develop a rash.  You have difficulty breathing.  You develop any type of allergic problem.  You have a fever. MAKE SURE YOU:  Understand these instructions.  Will watch your condition.  Will get help right away if you are not doing well or get worse. Document Released: 08/09/2013 Document Reviewed: 08/09/2013 [facility] Patient Information 2015 [place], [place]. This information is not intended to replace advice given to you by your health care provider. Make sure you discuss any questions you have with your health care provider.

## 2014-10-30 NOTE — Progress Notes (Signed)
Left arm , below IV site, began  having whelps and itching from wrist to slightly above anticubital. vanco slowed down and whelps and itching diminished.

## 2014-10-30 NOTE — H&P (Signed)
Chief Complaint: "I'm here for my port removal Referring Physician:Magrinat HPI: Annette Beltran is an 68 y.o. female who has completed treatment for pancreatic cancer. She is scheduled today to have her port removed. Chart, PMHx, meds reviewed.  Past Medical History:  Past Medical History  Diagnosis Date  . Macular degeneration   . Fluid retention   . Colitis, collagenous   . Vitamin D deficiency   . Osteopenia   . Celiac artery stenosis   . Splenic infarction   . Arthritis   . History of blood clots   . Anxiety   . Shingles   . Pancreatitis   . Osteoporosis   . Clotting disorder   . Breast cancer     rt lumpectomy  . Arrhythmia     "skips a beat"  [place]  heart  . GERD (gastroesophageal reflux disease)     Past Surgical History:  Past Surgical History  Procedure Laterality Date  . Foot surgery  2003    x 3, 2 on right, 1 on left  . Breast lumpectomy Right     radiation for 6 weeks  . Meniscus repair Right   . Eus N/A 11/23/2013    Procedure: UPPER ENDOSCOPIC ULTRASOUND (EUS) LINEAR;  Surgeon: Milus Banister, MD;  Location: [facility];  Service: Endoscopy;  Laterality: N/A;  . Tonsillectomy    . Laparoscopy N/A 12/20/2013    Procedure: LAPAROSCOPY DIAGNOSTIC ;  Surgeon: [name], MD;  Location: [place];  Service: General;  Laterality: N/A;  . Cholecystectomy N/A 12/20/2013    Procedure: LAPAROSCOPIC CHOLECYSTECTOMY;  Surgeon: [name], MD;  Location: [place];  Service: General;  Laterality: N/A;  . 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 Partial gastrectomy N/A 12/20/2013    Procedure: PARTIAL GASTRECTOMY;  Surgeon: [name], MD;  Location: [place];  Service: General;  Laterality: N/A;  . Portacath placement N/A 01/15/2014    Procedure: INSERTION PORT-A-CATH;  Surgeon: [name], MD;  Location: [facility];  Service: General;  Laterality: N/A;    Family  History:  Family History  Problem Relation Age of Onset  . Colon cancer Neg Hx   . Aneurysm Mother   . Stroke Mother   . Prostate cancer Father 59  . Skin cancer Brother 17    treated with interferon for 1 year  . Heart failure Paternal Aunt   . Breast cancer Paternal Aunt     dx over 4s  . Pancreatic cancer Maternal Grandmother     dx in her 52s  . Breast cancer Maternal Aunt     dx over 71  . Rheum arthritis Brother   . Breast cancer Paternal Aunt     dx over 68s  . Leukemia Paternal Aunt     Social History:  reports that she quit smoking about 14 years ago. Her smoking use included Cigarettes. She has a 14 pack-year smoking history. She has never used smokeless tobacco. She reports that she drinks alcohol. She reports that she does not use illicit drugs.  Allergies:  Allergies  Allergen Reactions  . Tape     Allergic  To  Tegaderm.  . Codeine Nausea And Vomiting  . Lactose Intolerance (Gi)   . Tequin     Severe stomach pain  . Penicillins Nausea Only and Rash    Medications:   Medication List    ASK  your doctor about these medications        ALEVE 220 MG Caps  Generic drug:  Naproxen Sodium  Take 440 mg by mouth 2 (two) times daily as needed (Pain).     ALPRAZolam 0.5 MG tablet  Commonly known as:  XANAX  Take 0.5 mg by mouth at bedtime.     antiseptic oral rinse Liqd  15 mLs by Mouth Rinse route as needed for dry mouth.     aspirin EC 81 MG tablet  Take 81 mg by mouth every morning.     b complex vitamins tablet  Take 1 tablet by mouth every morning.     Biotin 1 MG Caps  Take 1 capsule by mouth daily at 12 noon.     diphenhydrAMINE 25 MG tablet  Commonly known as:  BENADRYL  Take 25 mg by mouth at bedtime.     feeding supplement (RESOURCE BREEZE) Liqd  Take 1 Container by mouth 2 (two) times daily between meals.     furosemide 20 MG tablet  Commonly known as:  LASIX  Take 20 mg by mouth daily as needed for fluid.     ICAPS AREDS FORMULA  PO  Take 2 tablets by mouth every morning.     lidocaine-prilocaine cream  Commonly known as:  EMLA  Apply 1-2 hours to port a cath before procedure.     lipase/protease/amylase 12000 UNITS Cpep capsule  Commonly known as:  CREON  Take 24,000 Units by mouth 3 (three) times daily before meals.     lipase/protease/amylase 12000 UNITS Cpep capsule  Commonly known as:  CREON  TAKE TWO CAPSULES THREE TIMES DAILY WITHMEALS     CREON 12000 UNITS Cpep capsule  Generic drug:  lipase/protease/amylase  TAKE TWO CAPSULES THREE TIMES DAILY WITHMEALS     loratadine 10 MG tablet  Commonly known as:  CLARITIN  Take 10 mg by mouth daily.     meclizine 25 MG tablet  Commonly known as:  ANTIVERT  Take 1 tablet (25 mg total) by mouth 3 (three) times daily as needed for dizziness.     omeprazole 40 MG capsule  Commonly known as:  PRILOSEC  Take 1 capsule (40 mg total) by mouth daily.     ondansetron 8 MG tablet  Commonly known as:  ZOFRAN  Take 1 tablet (8 mg total) by mouth 2 (two) times daily as needed (Nausea or vomiting).     PARoxetine 10 MG tablet  Commonly known as:  PAXIL  Take 10 mg by mouth every morning.     PARoxetine 10 MG tablet  Commonly known as:  PAXIL  TAKE ONE TABLET BY MOUTH ONCE DAILY     prochlorperazine 10 MG tablet  Commonly known as:  COMPAZINE  Take 1 tablet (10 mg total) by mouth every 6 (six) hours as needed (Nausea or vomiting).     valACYclovir 1000 MG tablet  Commonly known as:  VALTREX  Take 1 tablet (1,000 mg total) by mouth 2 (two) times daily.     VITAMIN C PO  Take 1 tablet by mouth 2 (two) times daily.        Please HPI for pertinent positives, otherwise complete 10 system ROS negative.  Physical Exam: BP 149/56 mmHg  Pulse 91  Temp(Src) 98.3 F (36.8 C) (Oral)  Resp 20  Ht 5' 4.5" (1.638 m)  Wt 158 lb (71.668 kg)  BMI 26.71 kg/m2  SpO2 100% Body mass index is 26.71 kg/(m^2).   General  Appearance:  Alert, cooperative, no distress,  appears stated age  Head:  Normocephalic, without obvious abnormality, atraumatic  ENT: Unremarkable  Neck: Supple, symmetrical, trachea midline  Lungs:   Clear to auscultation bilaterally, no w/r/r, respirations unlabored without use of accessory muscles.  Chest Wall:  No tenderness or deformity. (L)chest port site noted.  Heart:  Regular rate and rhythm, S1, S2 normal, no murmur, rub or gallop.  Abdomen:   Soft, non-tender, non distended.  Neurologic: Normal affect, no gross deficits.  Labs: Results for orders placed or performed during the hospital encounter of 10/30/14 (from the past 48 hour(s))  CBC     Status: Abnormal   Collection Time: 10/30/14  1:05 PM  Result Value Ref Range   WBC 6.6 4.0 - 10.5 K/uL   RBC 4.56 3.87 - 5.11 MIL/uL   Hemoglobin 14.5 12.0 - 15.0 g/dL   HCT 40.1 36.0 - 46.0 %   MCV 87.9 78.0 - 100.0 fL   MCH 31.8 26.0 - 34.0 pg   MCHC 36.2 (H) 30.0 - 36.0 g/dL   RDW 14.4 11.5 - 15.5 %   Platelets 292 150 - 400 K/uL  Protime-INR     Status: None   Collection Time: 10/30/14  1:05 PM  Result Value Ref Range   Prothrombin Time 13.1 11.6 - 15.2 seconds   INR 0.98 0.00 - 1.49    Imaging: No results found.  Assessment/Plan Pancreatic cancer For port removal today Explained procedure, risks, complications, use of sedation Labs ok Consent signed in chart  Ascencion Dike PA-C 10/30/2014, 2:03 PM

## 2014-10-30 NOTE — Procedures (Signed)
L IJ Port removal No complication No blood loss. See complete dictation in [facility].

## 2014-11-08 ENCOUNTER — Encounter (HOSPITAL_COMMUNITY): Payer: Self-pay

## 2014-11-08 ENCOUNTER — Other Ambulatory Visit (HOSPITAL_BASED_OUTPATIENT_CLINIC_OR_DEPARTMENT_OTHER): Payer: Medicare Other

## 2014-11-08 ENCOUNTER — Ambulatory Visit (HOSPITAL_COMMUNITY)
Admission: RE | Admit: 2014-11-08 | Discharge: 2014-11-08 | Disposition: A | Discharge status: Home / Self Care | Payer: Medicare Other | Source: Ambulatory Visit | Attending: Oncology | Admitting: Oncology

## 2014-11-08 DIAGNOSIS — Z90411 Acquired partial absence of pancreas: Secondary | ICD-10-CM | POA: Insufficient documentation

## 2014-11-08 DIAGNOSIS — D735 Infarction of spleen: Secondary | ICD-10-CM

## 2014-11-08 DIAGNOSIS — E279 Disorder of adrenal gland, unspecified: Secondary | ICD-10-CM | POA: Insufficient documentation

## 2014-11-08 DIAGNOSIS — Z08 Encounter for follow-up examination after completed treatment for malignant neoplasm: Secondary | ICD-10-CM | POA: Diagnosis present

## 2014-11-08 DIAGNOSIS — I7090 Unspecified atherosclerosis: Secondary | ICD-10-CM | POA: Diagnosis not present

## 2014-11-08 DIAGNOSIS — C251 Malignant neoplasm of body of pancreas: Secondary | ICD-10-CM

## 2014-11-08 DIAGNOSIS — Z9049 Acquired absence of other specified parts of digestive tract: Secondary | ICD-10-CM | POA: Diagnosis not present

## 2014-11-08 DIAGNOSIS — Z9221 Personal history of antineoplastic chemotherapy: Secondary | ICD-10-CM | POA: Diagnosis not present

## 2014-11-08 DIAGNOSIS — Z853 Personal history of malignant neoplasm of breast: Secondary | ICD-10-CM | POA: Diagnosis not present

## 2014-11-08 DIAGNOSIS — N8331 Acquired atrophy of ovary: Secondary | ICD-10-CM | POA: Insufficient documentation

## 2014-11-08 DIAGNOSIS — Z903 Acquired absence of stomach [part of]: Secondary | ICD-10-CM | POA: Diagnosis not present

## 2014-11-08 DIAGNOSIS — Z923 Personal history of irradiation: Secondary | ICD-10-CM | POA: Insufficient documentation

## 2014-11-08 DIAGNOSIS — K52831 Collagenous colitis: Secondary | ICD-10-CM

## 2014-11-08 DIAGNOSIS — Z8507 Personal history of malignant neoplasm of pancreas: Secondary | ICD-10-CM | POA: Insufficient documentation

## 2014-11-08 DIAGNOSIS — Z9081 Acquired absence of spleen: Secondary | ICD-10-CM | POA: Insufficient documentation

## 2014-11-08 DIAGNOSIS — J984 Other disorders of lung: Secondary | ICD-10-CM | POA: Insufficient documentation

## 2014-11-08 DIAGNOSIS — C50911 Malignant neoplasm of unspecified site of right female breast: Secondary | ICD-10-CM

## 2014-11-08 DIAGNOSIS — C259 Malignant neoplasm of pancreas, unspecified: Secondary | ICD-10-CM

## 2014-11-08 DIAGNOSIS — I700 Atherosclerosis of aorta: Secondary | ICD-10-CM

## 2014-11-08 DIAGNOSIS — C252 Malignant neoplasm of tail of pancreas: Secondary | ICD-10-CM

## 2014-11-08 LAB — CBC WITH DIFFERENTIAL/PLATELET
BASO%: 0.7 % (ref 0.0–2.0)
Basophils Absolute: 0.1 10*3/uL (ref 0.0–0.1)
EOS%: 4.9 % (ref 0.0–7.0)
Eosinophils Absolute: 0.4 10*3/uL (ref 0.0–0.5)
HCT: 43.4 % (ref 34.8–46.6)
HGB: 14.8 g/dL (ref 11.6–15.9)
LYMPH#: 1 10*3/uL (ref 0.9–3.3)
LYMPH%: 11.2 % — ABNORMAL LOW (ref 14.0–49.7)
MCH: 30.8 pg (ref 25.1–34.0)
MCHC: 34.1 g/dL (ref 31.5–36.0)
MCV: 90.4 fL (ref 79.5–101.0)
MONO#: 0.9 10*3/uL (ref 0.1–0.9)
MONO%: 10.8 % (ref 0.0–14.0)
NEUT%: 72.4 % (ref 38.4–76.8)
NEUTROS ABS: 6.3 10*3/uL (ref 1.5–6.5)
Platelets: 368 10*3/uL (ref 145–400)
RBC: 4.8 10*6/uL (ref 3.70–5.45)
RDW: 13.8 % (ref 11.2–14.5)
WBC: 8.7 10*3/uL (ref 3.9–10.3)

## 2014-11-08 LAB — COMPREHENSIVE METABOLIC PANEL (CC13)
ALT: 14 U/L (ref 0–55)
ANION GAP: 9 meq/L (ref 3–11)
AST: 24 U/L (ref 5–34)
Albumin: 3.8 g/dL (ref 3.5–5.0)
Alkaline Phosphatase: 78 U/L (ref 40–150)
BUN: 16.9 mg/dL (ref 7.0–26.0)
CALCIUM: 9.4 mg/dL (ref 8.4–10.4)
CO2: 29 mEq/L (ref 22–29)
CREATININE: 0.8 mg/dL (ref 0.6–1.1)
Chloride: 103 mEq/L (ref 98–109)
EGFR: 74 mL/min/{1.73_m2} — ABNORMAL LOW (ref >90)
Glucose: 135 mg/dl (ref 70–140)
Potassium: 4.8 mEq/L (ref 3.5–5.1)
Sodium: 141 mEq/L (ref 136–145)
Total Bilirubin: 0.81 mg/dL (ref 0.20–1.20)
Total Protein: 7.2 g/dL (ref 6.4–8.3)

## 2014-11-08 MED ORDER — IOHEXOL 300 MG/ML  SOLN
100.0000 mL | Freq: Once | INTRAMUSCULAR | Status: AC | PRN
  Administered 2014-11-08: 100 mL via INTRAVENOUS

## 2014-11-09 ENCOUNTER — Telehealth: Payer: Self-pay | Admitting: Oncology

## 2014-11-09 ENCOUNTER — Other Ambulatory Visit: Payer: Self-pay | Admitting: Oncology

## 2014-11-09 ENCOUNTER — Other Ambulatory Visit: Payer: Self-pay | Admitting: *Deleted

## 2014-11-09 DIAGNOSIS — C250 Malignant neoplasm of head of pancreas: Secondary | ICD-10-CM

## 2014-11-09 LAB — CANCER ANTIGEN 19-9: CA 19 9: 93.1 U/mL — AB (ref <35.0)

## 2014-11-09 NOTE — Telephone Encounter (Signed)
per pof to sch pt appt-GM req PET prior to -appt-per CS 1st available is 1/18 @7am -stated pt refused-sent GM email to adv-will wait on reply

## 2014-11-15 ENCOUNTER — Ambulatory Visit (HOSPITAL_BASED_OUTPATIENT_CLINIC_OR_DEPARTMENT_OTHER): Payer: Medicare Other | Admitting: Oncology

## 2014-11-15 ENCOUNTER — Other Ambulatory Visit: Payer: Self-pay | Admitting: Oncology

## 2014-11-15 ENCOUNTER — Telehealth: Payer: Self-pay | Admitting: Oncology

## 2014-11-15 VITALS — BP 136/59 | HR 52 | Temp 97.9°F | Resp 18 | Ht 64.5 in | Wt 168.6 lb

## 2014-11-15 DIAGNOSIS — E440 Moderate protein-calorie malnutrition: Secondary | ICD-10-CM

## 2014-11-15 DIAGNOSIS — I700 Atherosclerosis of aorta: Secondary | ICD-10-CM

## 2014-11-15 DIAGNOSIS — C50911 Malignant neoplasm of unspecified site of right female breast: Secondary | ICD-10-CM

## 2014-11-15 DIAGNOSIS — D735 Infarction of spleen: Secondary | ICD-10-CM

## 2014-11-15 DIAGNOSIS — K5289 Other specified noninfective gastroenteritis and colitis: Secondary | ICD-10-CM

## 2014-11-15 DIAGNOSIS — C251 Malignant neoplasm of body of pancreas: Secondary | ICD-10-CM

## 2014-11-15 DIAGNOSIS — C252 Malignant neoplasm of tail of pancreas: Secondary | ICD-10-CM

## 2014-11-15 DIAGNOSIS — K52831 Collagenous colitis: Secondary | ICD-10-CM

## 2014-11-15 DIAGNOSIS — Z853 Personal history of malignant neoplasm of breast: Secondary | ICD-10-CM

## 2014-11-15 NOTE — Telephone Encounter (Signed)
, °

## 2014-11-15 NOTE — Progress Notes (Signed)
Annette Beltran  [phone] [phone]     ID: [name] OB: Feb 07, 1946  MR#: [id number]  QHU#:[id number]  PCP: [name], MD GYN:   SU: [name] OTHER MD: Delfin Edis, [name]  CHIEF COMPLAINT: Early stage pancreatic cancer resected with positive margins  CURRENT TREATMENT: Observation   PANCREATIC CANCER HISTORY: From the earlier intake note:  I formerly followed Annette Beltran for a stage I breast cancer, which was treated with surgery, radiation, and anti-estrogens. She was released from followup here in 2007. In addition, in 2012 she had abdominal pain leading to hospitalization. 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 The patient also has a history of collagenous colitis, which of course carries its own set of symptoms, making identification of her new attic problem more difficult.  Annette Beltran tells me she started "feeling bad" in September of 2014. There was abdominal discomfort localizing to the left upper o'clock on, worse with inspiration. CT angiography 08/21/2013 showed no clot and no significant abnormalities. Plain rib and left shoulder views were negative. CT abdomen and pelvis with contrast 08/22/2013 showed slight prominence of the pancreatic duct in the distal body and tail with a new small structure emanating from the tail of the pancreas consistent with a pseudocyst. Exudate near the tail of the pancreas suggested pancreatitis. MRI of the abdomen 09/03/2013 confirmed several ill-defined fluid collections between the gastric fundus and the splenic hilum surrounding the pancreatic tail. There was no evidence of a pancreatic mass. The pancreatic tail did enhanced following contrast. The splenic vein appeared chronically thrombosed  with a small amount of nonocclusive thrombus extending into the main and left portal veins.  On 09/26/2013 amylase was 237 and lipase 513.0, consistent with acute pancreatitis. These numbers subsequently dropped in on 11/28/2013 amylase was normal at 47 and lipase was mildly elevated at 110  With continuing left-sided abdominal discomfort, abdominal ultrasound 11/13/2013 showed multiple gallstones with no evidence of cholecystitis. Evaluation of the pancreas showed a hypoechoic lobulated focus measuring up to 7.4 cm felt to be nonspecific. Repeat abdominal CT 11/14/2013 showed diffuse pancreatic atrophy without evidence of a mass, again consistent with chronic pancreatitis. There was mild soft tissue stranding suspicious for mild superimposed acute pancreatitis. A new rim-enhancing fluid collection was noted in the gastrosplenic ligament measuring 4.5 cm, consistent with a pseudocyst. Finally on 11/23/2013, endoscopic ultrasonography under Oretha Caprice showed a subtle 2.5 cm soft tissue mass in the mid-pancreas, abutting the splenic vein. This was biopsied x2, with cytology ([id number]) showing adenocarcinoma. A peripancreatic fluid collection measuring over 5 cm was aspirated yielding 25 cc of chocolate colored fluid. Two or three scattered round hypoechoic lymph nodes were noted near the pancreatic tail but were not sampled.  In addition, on 11/28/2013 a chest x-ray obtained to evaluate upper respiratory symptoms showed a small to moderate left effusion. Chest CT scan 11/29/2013 confirmed a moderate left-sided pleural effusion with no suspicious appearing pulmonary nodules or masses associated with it. The complex cystic lesion associated with the tail of the pancreas was again noted, measuring 5.5 cm despite the recent aspiration procedure.  The patient's subsequent history is as detailed below.  INTERVAL HISTORY: Annette Beltran returns today for followup of her pancreatic cancer accompanied by her husband [name].  We have been following her CA 19 on  a regular basis, and there was a significant jump with the most recent determination. Accordingly I called her and set her up for a restaging abdominal CT, which was performed 11/08/2014. Luckily this shows no evidence of recurrent disease. I have requested a PET scan for further evaluation of possible recurrence  REVIEW OF SYSTEMS: Annette Beltran is having a little bit more activity from her collagenous colitis. This had been quiescent until the last few weeks. She has not noticed any blood or melanic stools. Aside from that she complains of blurred vision, gum disease, a little dry cough at times, arthritis "here and there" which is not more intense or persistent than before, and fatigue, with her sleeping between 9 and 12 hours every night. She does not sleep during the day. She tells me she enjoyed the holidays, was busy, and has been walking at least twice a week with her husband. She is having minimal hot flashes. A detailed review of systems today was otherwise noncontributory  PAST MEDICAL HISTORY: Past Medical History  Diagnosis Date  . Macular degeneration   . Fluid retention   . Colitis, collagenous   . Vitamin D deficiency   . Osteopenia   . Celiac artery stenosis   . Splenic infarction   . Arthritis   . History of blood clots   . Anxiety   . Shingles   . Pancreatitis   . Osteoporosis   . Clotting disorder   . Arrhythmia     "skips a beat"  [place]  heart  . GERD (gastroesophageal reflux disease)   . Breast cancer     rt lumpectomy    PAST SURGICAL HISTORY: Past Surgical History  Procedure Laterality Date  . Foot surgery  2003    x 3, 2 on right, 1 on left  . Breast lumpectomy Right     radiation for 6 weeks  . Meniscus repair Right   . Eus N/A 11/23/2013    Procedure: UPPER ENDOSCOPIC ULTRASOUND (EUS) LINEAR;  Surgeon: Milus Banister, MD;  Location: [facility];  Service: Endoscopy;  Laterality: N/A;  . Tonsillectomy    . Laparoscopy  N/A 12/20/2013    Procedure: LAPAROSCOPY DIAGNOSTIC ;  Surgeon: [name], MD;  Location: [place];  Service: General;  Laterality: N/A;  . Cholecystectomy N/A 12/20/2013    Procedure: LAPAROSCOPIC CHOLECYSTECTOMY;  Surgeon: [name], MD;  Location: [place];  Service: General;  Laterality: N/A;  . Splenectomy, total N/A 12/20/2013    Procedure: SPLENECTOMY;  Surgeon: [name], MD;  Location: [place];  Service: General;  Laterality: N/A;  . Partial gastrectomy N/A 12/20/2013    Procedure: PARTIAL GASTRECTOMY;  Surgeon: [name], MD;  Location: [place];  Service: General;  Laterality: N/A;  . Portacath placement N/A 01/15/2014    Procedure: INSERTION PORT-A-CATH;  Surgeon: [name], MD;  Location: [facility];  Service: General;  Laterality: N/A;    FAMILY HISTORY Family History  Problem Relation Age of Onset  . Colon cancer Neg Hx   . Aneurysm Mother   . Stroke Mother   . Prostate cancer Father 54  . Skin cancer Brother 70    treated with interferon for 1 year  . Heart failure Paternal Aunt   . Breast cancer Paternal Aunt     dx over 60s  . Pancreatic cancer Maternal Grandmother     dx in her 11s  . Breast cancer Maternal Aunt     dx over 50  . Rheum arthritis Brother   .  Breast cancer Paternal Aunt     dx over 44s  . Leukemia Paternal Aunt    the patient's father had a diagnosis of prostate cancer metastatic to the liver or a died at age 89. The patient's mother died secondary to carotid third brain aneurysm at the age of 32. The patient had 2 brothers, no sisters. One brother has a history of melanoma. The patient's mother is mother was diagnosed with pancreatic cancer in her 80s. There is a history of breast cancer and second degree relatives, none before the age of 90  GYNECOLOGIC HISTORY:  Menarche age 27, first live birth age 16, the patient is Annette Beltran.. Stopped in her early 45s. 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 Her husband of 69 years, [name], retired from [employer] and currently works part-time as a Cabin crew with [employer]. Daughter Marita Kansas teaches first grade. Daughter Leafy Ro is a homemaker. The patient has 4 grandchildren [name] has an additional 5 grandchildren of his own). The patient attends a [employer]    ADVANCED DIRECTIVES: In place   HEALTH MAINTENANCE: History  Substance Use Topics  . Smoking status: Former Smoker -- 0.50 packs/day for 28 years    Types: Cigarettes    Quit date: 11/02/2000  . Smokeless tobacco: Never Used  . Alcohol Use: Yes     Comment: occ wine      Colonoscopy:  PAP:  Bone density: 09/21/2014, showing osteopenia with a T score of -1.7  Lipid panel:  Mammography: 09/21/2014 unremarkable  Allergies  Allergen Reactions  . Tape     Allergic  To  Tegaderm.  . Codeine Nausea And Vomiting  . Lactose Intolerance (Gi)   . Tequin     Severe stomach pain  . Vancomycin Itching    Whelps on arm of Infusing IV site  . Penicillins Nausea Only and Rash    Current Outpatient Prescriptions  Medication Sig Dispense Refill  . ALPRAZolam (XANAX) 0.5 MG tablet Take 0.5 mg by mouth at bedtime.    Marland Kitchen antiseptic oral rinse (BIOTENE) LIQD 15 mLs by Mouth Rinse route as needed for dry mouth.    . Ascorbic Acid (VITAMIN C PO) Take 1 tablet by mouth 2 (two) times daily.    Marland Kitchen aspirin EC 81 MG tablet Take 81 mg by mouth every morning.     Marland Kitchen b complex vitamins tablet Take 1 tablet by mouth every morning.     . Biotin 1 MG CAPS Take 1 capsule by mouth daily at 12 noon.     Marland Kitchen CREON 12000 UNITS CPEP capsule TAKE TWO CAPSULES THREE TIMES DAILY WITHMEALS 270 capsule 1  . diphenhydrAMINE (BENADRYL) 25 MG tablet Take 25 mg by mouth at bedtime.     . feeding supplement, RESOURCE BREEZE, (RESOURCE BREEZE) LIQD Take 1 Container by mouth 2 (two) times daily between meals. (Patient not taking:  Reported on 10/18/2014) 30 Container 3  . furosemide (LASIX) 20 MG tablet Take 20 mg by mouth daily as needed for fluid.    Marland Kitchen lidocaine-prilocaine (EMLA) cream Apply 1-2 hours to port a cath before procedure. (Patient not taking: Reported on 10/29/2014) 30 g 6  . lipase/protease/amylase (CREON) 12000 UNITS CPEP capsule TAKE TWO CAPSULES THREE TIMES DAILY WITHMEALS (Patient not taking: Reported on 10/18/2014) 270 capsule 1  . lipase/protease/amylase (CREON) 12000 UNITS CPEP capsule  Take 24,000 Units by mouth 3 (three) times daily before meals.    Marland Kitchen loratadine (CLARITIN) 10 MG tablet Take 10 mg by mouth daily.    . meclizine (ANTIVERT) 25 MG tablet Take 1 tablet (25 mg total) by mouth 3 (three) times daily as needed for dizziness. (Patient not taking: Reported on 10/29/2014) 90 tablet 1  . Multiple Vitamins-Minerals (ICAPS AREDS FORMULA PO) Take 2 tablets by mouth every morning.     . Naproxen Sodium (ALEVE) 220 MG CAPS Take 440 mg by mouth 2 (two) times daily as needed (Pain).    Marland Kitchen omeprazole (PRILOSEC) 40 MG capsule Take 1 capsule (40 mg total) by mouth daily. (Patient taking differently: Take 40 mg by mouth every morning. ) 90 capsule 4  . ondansetron (ZOFRAN) 8 MG tablet Take 1 tablet (8 mg total) by mouth 2 (two) times daily as needed (Nausea or vomiting). (Patient not taking: Reported on 10/18/2014) 30 tablet 1  . PARoxetine (PAXIL) 10 MG tablet Take 10 mg by mouth every morning.    Marland Kitchen PARoxetine (PAXIL) 10 MG tablet TAKE ONE TABLET BY MOUTH ONCE DAILY (Patient not taking: Reported on 10/18/2014) 30 tablet 7  . prochlorperazine (COMPAZINE) 10 MG tablet Take 1 tablet (10 mg total) by mouth every 6 (six) hours as needed (Nausea or vomiting). (Patient not taking: Reported on 10/18/2014) 30 tablet 1  . valACYclovir (VALTREX) 1000 MG tablet Take 1 tablet (1,000 mg total) by mouth 2 (two) times daily. (Patient not taking: Reported on 10/18/2014) 90 tablet 1   No current facility-administered medications  for this visit.    OBJECTIVE: Middle-aged white woman in no acute distress Filed Vitals:   11/15/14 1134  BP: 136/59  Pulse: 52  Temp: 97.9 F (36.6 C)  Resp: 18     Body mass index is 28.5 kg/(m^2).    ECOG FS:1 - Symptomatic but completely ambulatory  Sclerae unicteric, pupils round and equal Oropharynx clear, dentition in good repair No cervical or supraclavicular adenopathy Lungs no rales or rhonchi Heart regular rate and rhythm Abd soft, nontender, positive bowel sounds; no masses palpated MSK no focal spinal tenderness, no upper extremity lymphedema Neuro: nonfocal, well oriented, positive affect Breasts: Deferred    LAB RESULTS: Results for Annette, Beltran (MRN [id number]) as of 11/17/2014 10:51  Ref. Range 05/28/2014 09:23 06/04/2014 08:14 07/10/2014 08:56 08/15/2014 09:01 11/08/2014 11:03  CA 19-9 Latest Range: <35.0 U/mL 21.0 25.1 22.4 26.1 93.1 (H)   CMP     Component Value Date/Time   NA 141 11/08/2014 1103   NA 139 06/01/2014 1155   K 4.8 11/08/2014 1103   K 4.6 06/01/2014 1155   CL 100 06/01/2014 1155   CO2 29 11/08/2014 1103   CO2 27 06/01/2014 1155   GLUCOSE 135 11/08/2014 1103   GLUCOSE 101* 06/01/2014 1155   BUN 16.9 11/08/2014 1103   BUN 13 06/01/2014 1155   CREATININE 0.8 11/08/2014 1103   CREATININE 0.59 06/01/2014 1155   CREATININE 0.62 11/28/2013 1635   CALCIUM 9.4 11/08/2014 1103   CALCIUM 9.5 06/01/2014 1155   PROT 7.2 11/08/2014 1103   PROT 5.0* 12/21/2013 0450   ALBUMIN 3.8 11/08/2014 1103   ALBUMIN 2.3* 12/21/2013 0450   AST 24 11/08/2014 1103   AST 42* 12/21/2013 0450   ALT 14 11/08/2014 1103   ALT 25 12/21/2013 0450   ALKPHOS 78 11/08/2014 1103   ALKPHOS 46 12/21/2013 0450   BILITOT 0.81 11/08/2014 1103   BILITOT 0.7 12/21/2013 0450  GFRNONAA >90 06/01/2014 1155   GFRAA >90 06/01/2014 1155    I No results found for: SPEP  Lab Results  Component Value Date   WBC 8.7 11/08/2014   NEUTROABS 6.3 11/08/2014   HGB 14.8  11/08/2014   HCT 43.4 11/08/2014   MCV 90.4 11/08/2014   PLT 368 11/08/2014      Chemistry      Component Value Date/Time   NA 141 11/08/2014 1103   NA 139 06/01/2014 1155   K 4.8 11/08/2014 1103   K 4.6 06/01/2014 1155   CL 100 06/01/2014 1155   CO2 29 11/08/2014 1103   CO2 27 06/01/2014 1155   BUN 16.9 11/08/2014 1103   BUN 13 06/01/2014 1155   CREATININE 0.8 11/08/2014 1103   CREATININE 0.59 06/01/2014 1155   CREATININE 0.62 11/28/2013 1635      Component Value Date/Time   CALCIUM 9.4 11/08/2014 1103   CALCIUM 9.5 06/01/2014 1155   ALKPHOS 78 11/08/2014 1103   ALKPHOS 46 12/21/2013 0450   AST 24 11/08/2014 1103   AST 42* 12/21/2013 0450   ALT 14 11/08/2014 1103   ALT 25 12/21/2013 0450   BILITOT 0.81 11/08/2014 1103   BILITOT 0.7 12/21/2013 0450     Lab Results  Component Value Date   LABCA2 8 06/08/2011    No components found for: [id number]  No results for input(s): INR in the last 168 hours.  Urinalysis    Component Value Date/Time   COLORURINE YELLOW 06/07/2011 1942   APPEARANCEUR CLEAR 06/07/2011 1942   LABSPEC 1.010 02/15/2014 1109   LABSPEC 1.009 06/07/2011 1942   PHURINE 6.0 06/07/2011 1942   GLUCOSEU Negative 02/15/2014 1109   GLUCOSEU NEGATIVE 06/07/2011 1942   HGBUR NEGATIVE 06/07/2011 1942   BILIRUBINUR neg 12/25/2011 1216   BILIRUBINUR NEGATIVE 06/07/2011 1942   KETONESUR NEGATIVE 06/07/2011 1942   PROTEINUR neg 12/25/2011 1216   PROTEINUR NEGATIVE 06/07/2011 1942   UROBILINOGEN 0.2 02/15/2014 1109   UROBILINOGEN negative 12/25/2011 1216   UROBILINOGEN 0.2 06/07/2011 1942   NITRITE neg 12/25/2011 1216   NITRITE NEGATIVE 06/07/2011 1942   LEUKOCYTESUR Negative 12/25/2011 1216    STUDIES: Dg Chest 2 View  11/08/2014   CLINICAL DATA:  Malignant neoplasm of body of pancreas. Right-sided breast cancer.  EXAM: CHEST  2 VIEW  COMPARISON:  January 15, 2014.  FINDINGS: The heart size and mediastinal contours are within normal limits. Both lungs  are clear. No pneumothorax or pleural effusion is noted. The visualized skeletal structures are unremarkable. Left-sided Port-A-Cath noted on prior exam has been removed.  IMPRESSION: No acute cardiopulmonary abnormality seen.   Electronically Signed   By: Sabino Dick M.D.   On: 11/08/2014 14:13   Ct Abdomen Pelvis W Contrast  11/08/2014   CLINICAL DATA:  69 year old female with history of pancreatic cancer diagnosed in January 2015 status post distal pancreatectomy, splenectomy and partial gastrectomy, as well as chemotherapy and radiation therapy, completed in September 2015. Remote history of right-sided breast cancer status post lumpectomy at radiation therapy, complete.  EXAM: CT ABDOMEN AND PELVIS WITH CONTRAST  TECHNIQUE: Multidetector CT imaging of the abdomen and pelvis was performed using the standard protocol following bolus administration of intravenous contrast.  CONTRAST:  171m OMNIPAQUE IOHEXOL 300 MG/ML  SOLN  COMPARISON:  CT of the chest, abdomen and pelvis 08/13/2014.  FINDINGS: Lower chest:  Mild scarring lung bases bilaterally, unchanged.  Hepatobiliary: No cystic or solid hepatic lesions. No intra or extrahepatic biliary ductal dilatation. Status  post cholecystectomy.  Pancreas: Status post distal pancreatectomy. The residual head and uncinate process of the pancreas are normal in appearance. There continues to be a small amount of soft tissue adjacent to the resection margin (image 25 of series 3) measuring 1.9 x 1.0 cm, essentially unchanged compared to the prior examination. No other new peripancreatic fluid collections or soft tissue.  Spleen: Status post splenectomy. No soft tissue mass in the splenectomy bed.  Adrenals/Urinary Tract: Sub cm low-attenuation lesion in the lower pole the left kidney is too small to definitively characterize, but similar to prior examinations, favored to represent a tiny cyst. Right kidney is normal in appearance. No hydroureteronephrosis. Urinary bladder  is normal in appearance. Slight nodular contour of the left adrenal gland, without definite dominant nodule, unchanged. Right adrenal gland is normal in appearance.  Stomach/Bowel: Postoperative changes of partial gastrectomy along the greater curvature in the fundus and cardia, similar to the prior study. No soft tissue mass in this region to suggest local recurrence of disease. Distal aspect of the stomach is normal in appearance. No pathologic dilatation of small bowel or colon.  Vascular/Lymphatic: Atherosclerosis in the abdominal and pelvic vasculature, without evidence of aneurysm or dissection. Circumaortic left renal vein (normal anatomical variant) incidentally noted. No lymphadenopathy noted in the abdomen or pelvis.  Reproductive: Uterus is unremarkable.  Ovaries are atrophic.  Other: No significant volume of ascites.  No pneumoperitoneum.  Musculoskeletal: There are no aggressive appearing lytic or blastic lesions noted in the visualized portions of the skeleton.  IMPRESSION: 1. Stable postoperative changes of distal pancreatectomy, splenectomy and partial gastrectomy, without definite findings to suggest local recurrence of disease or metastatic disease in the abdomen or pelvis. There continues to be a small amount of soft tissue adjacent to the resection line which is unchanged compared to the prior study, and favored to represent some postoperative scar tissue. Continued attention on future followup studies to ensure stability is recommended. 2. Additional incidental findings, as above.   Electronically Signed   By: Vinnie Langton M.D.   On: 11/08/2014 13:17   Ir Removal [employer] Access W/ Port W/o Fl Mod Sed  10/30/2014   CLINICAL DATA:  Breast carcinoma, post chemotherapy. Previously placed port catheter no longer needed  EXAM: EXAM TUNNELED PORT CATHETER REMOVAL  TECHNIQUE: The procedure, risks (including but not limited to bleeding, infection, organ damage ), benefits, and alternatives were  explained to the patient. Questions regarding the procedure were encouraged and answered. The patient understands and consents to the procedure.  Intravenous Fentanyl and Versed were administered as conscious sedation during continuous cardiorespiratory monitoring by the radiology RN, with a total moderate sedation time of 20 minutes.  Overlying skin prepped with chlorhexidine, draped in usual sterile fashion, infiltrated locally with 1% lidocaine. A small incision was made over the scar from previous placement. The port catheter was dissected free from the underlying soft tissues and removed intact. Hemostasis was achieved. The port pocket was closed with deep interrupted and subcuticular continuous 3-0 Monocryl sutures, then covered with Dermabond. The patient tolerated the procedure well.  COMPLICATIONS: COMPLICATIONS None immediate  IMPRESSION: 1.  Technically successful tunneled Port catheter removal.   Electronically Signed   By: Arne Cleveland M.D.   On: 10/30/2014 16:37     ASSESSMENT: 69 y.o. 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 noted on abdominal MRI November 2014  PANCREATIC CANCER: (3) status post endoscopic ultrasonography of the pancreas 11/23/2013 with cytology positive for adenocarcinoma, clinical stage T2 NX MX adenocarcinoma associated with a  5.5 cm pseudocyst (which was aspirated); baseline CA 19-9 was 47.0  (4) left pleural effusion status post thoracentesis 12/01/2013, cytology negative  (5) s/p laparoscopic cholecystectomy, open extended distal pancreatectomy, splenectomy, partial gastrectomy x2 12/20/2013 for a pT1 pN0, stage IA invasive ductal adenocarcinoma, grade 2, with positive resection margins  (a) "triple vaccination"(meningococcus, pneumococcus, Haemophilus) given 08/15/2014  (6) adjuvant gemcitabine started 02/01/2014-- received 3 weekly doses before radiation, will reeive 9 additional doses starting 3-5 weeks after completion of radiation  (7) adjuvant radiation started 02/27/2014, with radiosensitization (capecitabine 1000 mg po BID on radiation days); completed 04/06/2014  (8) adjuvant gemcitabine weekly resumed 05/07/2014,  completed 9 doses 07/10/2014  (9) rise in CA 19 noted 11/08/2014: CT scan of the abdomen and pelvis obtained the same day showed no evidence of disease recurrence  PLAN: Annette Beltran is understandably anxious and concerned regarding the rise in her CA 3. She is aware of the fact that most pancreatic cancers, even when localized as hers was, are not cured. However we have not documented a recurrence at this point. We have a PET scan pending and hopefully that also will be negative.  Certainly a rise in the CA 19 can precede an overt clinically apparent recurrence by several months. On the other hand I do not know how her collagenous colitis is playing into this. We generally don't check CA 19 for that particular problem. It could be that that is the reason her CA 19 is slightly elevated.  As far as a collagenous colitis is concerned I discussed budesonide with her. She understands this is somewhat systemically absorbed, but much less so than prednisone or similar steroids. However she is "used 2" the loose bowel movements she is currently experienced and does not feel this requires intervention at  present.  Accordingly the plan is to check the PET scan and assuming that is negative follow the CA 19 closely. If we have disease recurrence, we will discuss with Dr. Barry Dienes whether further surgery would be indicated. Annalisse has a good understanding of the overall plan. She agrees with it.  She will call with any problems that may develop before her next visit here.  Incidentally [name] had some sensation of swelling in his lower pelvis, but exam of that area today was unremarkable.Marland Kitchen  Chauncey Cruel, MD   11/15/2014 11:59 AM

## 2014-11-20 ENCOUNTER — Ambulatory Visit (HOSPITAL_COMMUNITY)
Admission: RE | Admit: 2014-11-20 | Discharge: 2014-11-20 | Disposition: A | Discharge status: Home / Self Care | Payer: Medicare Other | Source: Ambulatory Visit | Attending: Oncology | Admitting: Oncology

## 2014-11-20 ENCOUNTER — Encounter (HOSPITAL_COMMUNITY): Payer: Self-pay

## 2014-11-20 DIAGNOSIS — C259 Malignant neoplasm of pancreas, unspecified: Secondary | ICD-10-CM | POA: Diagnosis not present

## 2014-11-20 DIAGNOSIS — Z9221 Personal history of antineoplastic chemotherapy: Secondary | ICD-10-CM | POA: Diagnosis not present

## 2014-11-20 DIAGNOSIS — Z9081 Acquired absence of spleen: Secondary | ICD-10-CM | POA: Insufficient documentation

## 2014-11-20 DIAGNOSIS — E891 Postprocedural hypoinsulinemia: Secondary | ICD-10-CM | POA: Diagnosis not present

## 2014-11-20 DIAGNOSIS — Z923 Personal history of irradiation: Secondary | ICD-10-CM | POA: Insufficient documentation

## 2014-11-20 DIAGNOSIS — Z08 Encounter for follow-up examination after completed treatment for malignant neoplasm: Secondary | ICD-10-CM | POA: Insufficient documentation

## 2014-11-20 DIAGNOSIS — Z90411 Acquired partial absence of pancreas: Secondary | ICD-10-CM | POA: Insufficient documentation

## 2014-11-20 DIAGNOSIS — Z903 Acquired absence of stomach [part of]: Secondary | ICD-10-CM | POA: Diagnosis not present

## 2014-11-20 DIAGNOSIS — C250 Malignant neoplasm of head of pancreas: Secondary | ICD-10-CM

## 2014-11-20 DIAGNOSIS — Z853 Personal history of malignant neoplasm of breast: Secondary | ICD-10-CM | POA: Insufficient documentation

## 2014-11-20 LAB — GLUCOSE, CAPILLARY: GLUCOSE-CAPILLARY: 127 mg/dL — AB (ref 70–99)

## 2014-11-20 MED ORDER — FLUDEOXYGLUCOSE F - 18 (FDG) INJECTION
7.9900 | Freq: Once | INTRAVENOUS | Status: AC | PRN
  Administered 2014-11-20: 7.99 via INTRAVENOUS

## 2014-12-03 ENCOUNTER — Telehealth: Payer: Self-pay | Admitting: *Deleted

## 2014-12-05 ENCOUNTER — Other Ambulatory Visit (HOSPITAL_BASED_OUTPATIENT_CLINIC_OR_DEPARTMENT_OTHER): Payer: Medicare Other

## 2014-12-05 DIAGNOSIS — C252 Malignant neoplasm of tail of pancreas: Secondary | ICD-10-CM

## 2014-12-05 DIAGNOSIS — C259 Malignant neoplasm of pancreas, unspecified: Secondary | ICD-10-CM

## 2014-12-05 LAB — CBC WITH DIFFERENTIAL/PLATELET
BASO%: 0.7 % (ref 0.0–2.0)
BASOS ABS: 0.1 10*3/uL (ref 0.0–0.1)
EOS%: 4 % (ref 0.0–7.0)
Eosinophils Absolute: 0.3 10*3/uL (ref 0.0–0.5)
HCT: 41.6 % (ref 34.8–46.6)
HGB: 14.4 g/dL (ref 11.6–15.9)
LYMPH%: 8.5 % — AB (ref 14.0–49.7)
MCH: 32.2 pg (ref 25.1–34.0)
MCHC: 34.6 g/dL (ref 31.5–36.0)
MCV: 92.9 fL (ref 79.5–101.0)
MONO#: 0.9 10*3/uL (ref 0.1–0.9)
MONO%: 11.5 % (ref 0.0–14.0)
NEUT#: 5.9 10*3/uL (ref 1.5–6.5)
NEUT%: 75.3 % (ref 38.4–76.8)
Platelets: 336 10*3/uL (ref 145–400)
RBC: 4.48 10*6/uL (ref 3.70–5.45)
RDW: 13.9 % (ref 11.2–14.5)
WBC: 7.8 10*3/uL (ref 3.9–10.3)
lymph#: 0.7 10*3/uL — ABNORMAL LOW (ref 0.9–3.3)

## 2014-12-05 LAB — COMPREHENSIVE METABOLIC PANEL (CC13)
ALBUMIN: 3.7 g/dL (ref 3.5–5.0)
ALT: 16 U/L (ref 0–55)
AST: 23 U/L (ref 5–34)
Alkaline Phosphatase: 71 U/L (ref 40–150)
Anion Gap: 8 mEq/L (ref 3–11)
BUN: 14.8 mg/dL (ref 7.0–26.0)
CO2: 27 mEq/L (ref 22–29)
Calcium: 8.8 mg/dL (ref 8.4–10.4)
Chloride: 104 mEq/L (ref 98–109)
Creatinine: 0.8 mg/dL (ref 0.6–1.1)
EGFR: 72 mL/min/{1.73_m2} — AB (ref >90)
GLUCOSE: 238 mg/dL — AB (ref 70–140)
Potassium: 4.5 mEq/L (ref 3.5–5.1)
Sodium: 140 mEq/L (ref 136–145)
TOTAL PROTEIN: 6.9 g/dL (ref 6.4–8.3)
Total Bilirubin: 0.59 mg/dL (ref 0.20–1.20)

## 2014-12-07 ENCOUNTER — Ambulatory Visit: Payer: Medicare Other

## 2014-12-07 ENCOUNTER — Ambulatory Visit: Payer: Medicare Other | Admitting: Oncology

## 2014-12-09 ENCOUNTER — Other Ambulatory Visit: Payer: Self-pay | Admitting: Oncology

## 2014-12-09 NOTE — Progress Notes (Signed)
  I called Annette Beltran with the results of her PET scan. She canceled the appointment for unrelated reasons. She will see me in March, with a repeat CA 19 prior to that visit.

## 2014-12-10 ENCOUNTER — Telehealth: Payer: Self-pay | Admitting: Oncology

## 2014-12-10 NOTE — Telephone Encounter (Signed)
, °

## 2014-12-21 NOTE — Telephone Encounter (Signed)
none

## 2014-12-25 ENCOUNTER — Other Ambulatory Visit (HOSPITAL_BASED_OUTPATIENT_CLINIC_OR_DEPARTMENT_OTHER): Payer: Medicare Other

## 2014-12-25 DIAGNOSIS — C252 Malignant neoplasm of tail of pancreas: Secondary | ICD-10-CM

## 2014-12-25 DIAGNOSIS — C251 Malignant neoplasm of body of pancreas: Secondary | ICD-10-CM

## 2014-12-25 LAB — CBC WITH DIFFERENTIAL/PLATELET
BASO%: 0.6 % (ref 0.0–2.0)
Basophils Absolute: 0.1 10*3/uL (ref 0.0–0.1)
EOS ABS: 0.6 10*3/uL — AB (ref 0.0–0.5)
EOS%: 7.1 % — AB (ref 0.0–7.0)
HCT: 38.3 % (ref 34.8–46.6)
HGB: 13.6 g/dL (ref 11.6–15.9)
LYMPH%: 15.7 % (ref 14.0–49.7)
MCH: 32.5 pg (ref 25.1–34.0)
MCHC: 35.5 g/dL (ref 31.5–36.0)
MCV: 91.6 fL (ref 79.5–101.0)
MONO#: 1.1 10*3/uL — ABNORMAL HIGH (ref 0.1–0.9)
MONO%: 13.3 % (ref 0.0–14.0)
NEUT#: 5.3 10*3/uL (ref 1.5–6.5)
NEUT%: 63.3 % (ref 38.4–76.8)
Platelets: 277 10*3/uL (ref 145–400)
RBC: 4.18 10*6/uL (ref 3.70–5.45)
RDW: 13.7 % (ref 11.2–14.5)
WBC: 8.4 10*3/uL (ref 3.9–10.3)
lymph#: 1.3 10*3/uL (ref 0.9–3.3)

## 2014-12-25 LAB — COMPREHENSIVE METABOLIC PANEL (CC13)
ALBUMIN: 3.5 g/dL (ref 3.5–5.0)
ALT: 12 U/L (ref 0–55)
AST: 21 U/L (ref 5–34)
Alkaline Phosphatase: 69 U/L (ref 40–150)
Anion Gap: 10 mEq/L (ref 3–11)
BUN: 15.7 mg/dL (ref 7.0–26.0)
CALCIUM: 9.3 mg/dL (ref 8.4–10.4)
CHLORIDE: 106 meq/L (ref 98–109)
CO2: 27 meq/L (ref 22–29)
Creatinine: 0.8 mg/dL (ref 0.6–1.1)
EGFR: 76 mL/min/{1.73_m2} — ABNORMAL LOW (ref >90)
Glucose: 155 mg/dl — ABNORMAL HIGH (ref 70–140)
Potassium: 5 mEq/L (ref 3.5–5.1)
Sodium: 143 mEq/L (ref 136–145)
Total Bilirubin: 0.55 mg/dL (ref 0.20–1.20)
Total Protein: 6.6 g/dL (ref 6.4–8.3)

## 2014-12-25 LAB — CANCER ANTIGEN 19-9: CA 19-9: 239.6 U/mL — ABNORMAL HIGH (ref <35.0)

## 2014-12-27 ENCOUNTER — Other Ambulatory Visit: Payer: Self-pay | Admitting: Oncology

## 2014-12-27 ENCOUNTER — Other Ambulatory Visit: Payer: Self-pay | Admitting: Nurse Practitioner

## 2014-12-28 ENCOUNTER — Other Ambulatory Visit: Payer: Medicare Other

## 2014-12-28 ENCOUNTER — Other Ambulatory Visit: Payer: Self-pay | Admitting: Oncology

## 2014-12-28 ENCOUNTER — Telehealth: Payer: Self-pay | Admitting: *Deleted

## 2014-12-28 DIAGNOSIS — C259 Malignant neoplasm of pancreas, unspecified: Secondary | ICD-10-CM

## 2014-12-28 NOTE — Telephone Encounter (Signed)
Carrie in [employer] for radiology called to say that patient wants her MRI abd at [facility]. Because she is claustrophobic.  Spoke with Morey Hummingbird, she transferred the order to [facility]. Pt. Has had previous MRIs at that location.

## 2014-12-30 ENCOUNTER — Ambulatory Visit
Admission: RE | Admit: 2014-12-30 | Discharge: 2014-12-30 | Disposition: A | Discharge status: Home / Self Care | Payer: Medicare Other | Source: Ambulatory Visit | Attending: Oncology | Admitting: Oncology

## 2014-12-30 DIAGNOSIS — C259 Malignant neoplasm of pancreas, unspecified: Secondary | ICD-10-CM

## 2014-12-30 MED ORDER — GADOBENATE DIMEGLUMINE 529 MG/ML IV SOLN
15.0000 mL | Freq: Once | INTRAVENOUS | Status: AC | PRN
  Administered 2014-12-30: 15 mL via INTRAVENOUS

## 2014-12-31 ENCOUNTER — Telehealth: Payer: Self-pay | Admitting: *Deleted

## 2014-12-31 NOTE — Telephone Encounter (Signed)
THIS NOTE SENT TO DR.MAGRINAT'S NURSE, VAL DODD,RN.

## 2015-01-02 ENCOUNTER — Other Ambulatory Visit: Payer: Self-pay | Admitting: Oncology

## 2015-01-04 ENCOUNTER — Ambulatory Visit (HOSPITAL_BASED_OUTPATIENT_CLINIC_OR_DEPARTMENT_OTHER): Payer: Medicare Other | Admitting: Oncology

## 2015-01-04 ENCOUNTER — Telehealth: Payer: Self-pay | Admitting: Oncology

## 2015-01-04 VITALS — BP 140/61 | HR 57 | Temp 98.4°F | Resp 18 | Ht 64.5 in | Wt 169.4 lb

## 2015-01-04 DIAGNOSIS — C251 Malignant neoplasm of body of pancreas: Secondary | ICD-10-CM

## 2015-01-04 DIAGNOSIS — Z853 Personal history of malignant neoplasm of breast: Secondary | ICD-10-CM

## 2015-01-04 DIAGNOSIS — I700 Atherosclerosis of aorta: Secondary | ICD-10-CM

## 2015-01-04 DIAGNOSIS — K52831 Collagenous colitis: Secondary | ICD-10-CM

## 2015-01-04 DIAGNOSIS — C252 Malignant neoplasm of tail of pancreas: Secondary | ICD-10-CM | POA: Diagnosis not present

## 2015-01-04 DIAGNOSIS — M8580 Other specified disorders of bone density and structure, unspecified site: Secondary | ICD-10-CM

## 2015-01-04 DIAGNOSIS — C50911 Malignant neoplasm of unspecified site of right female breast: Secondary | ICD-10-CM

## 2015-01-04 DIAGNOSIS — D735 Infarction of spleen: Secondary | ICD-10-CM

## 2015-01-04 DIAGNOSIS — E440 Moderate protein-calorie malnutrition: Secondary | ICD-10-CM

## 2015-01-04 NOTE — Progress Notes (Signed)
[place]  [phone] Fax:(336) [phone]     ID: Annette Beltran OB: 1946/10/30  MR#: [id number]  JYN#:[id number]  PCP: [name], MD GYN:   SU: [name] OTHER MD: Delfin Edis, [name], Marica Otter (opth)  CHIEF COMPLAINT: Early stage pancreatic cancer resected with positive margins  CURRENT TREATMENT: Observation   PANCREATIC CANCER HISTORY: From the earlier intake note:  I formerly followed [name] for a stage I breast cancer, which was treated with surgery, radiation, and anti-estrogens. She was released from followup here in 2007. In addition, in 2012 she had abdominal pain leading to hospitalization. 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 The patient also has a history of collagenous colitis, which of course carries its own set of symptoms, making identification of her new attic problem more difficult.  Laconda tells me she started "feeling bad" in September of 2014. There was abdominal discomfort localizing to the left upper o'clock on, worse with inspiration. CT angiography 08/21/2013 showed no clot and no significant abnormalities. Plain rib and left shoulder views were negative. CT abdomen and pelvis with contrast 08/22/2013 showed slight prominence of the pancreatic duct in the distal body and tail with a new small structure emanating from the tail of the pancreas consistent with a pseudocyst. Exudate near the tail of the pancreas suggested pancreatitis. MRI of the abdomen 09/03/2013 confirmed several ill-defined fluid collections between the gastric fundus and the splenic hilum surrounding the pancreatic tail. There was no evidence of a pancreatic mass. The pancreatic tail did enhanced following contrast. The splenic vein appeared  chronically thrombosed with a small amount of nonocclusive thrombus extending into the main and left portal veins.  On 09/26/2013 amylase was 237 and lipase 513.0, consistent with acute pancreatitis. These numbers subsequently dropped in on 11/28/2013 amylase was normal at 47 and lipase was mildly elevated at 110  With continuing left-sided abdominal discomfort, abdominal ultrasound 11/13/2013 showed multiple gallstones with no evidence of cholecystitis. Evaluation of the pancreas showed a hypoechoic lobulated focus measuring up to 7.4 cm felt to be nonspecific. Repeat abdominal CT 11/14/2013 showed diffuse pancreatic atrophy without evidence of a mass, again consistent with chronic pancreatitis. There was mild soft tissue stranding suspicious for mild superimposed acute pancreatitis. A new rim-enhancing fluid collection was noted in the gastrosplenic ligament measuring 4.5 cm, consistent with a pseudocyst. Finally on 11/23/2013, endoscopic ultrasonography under Oretha Caprice showed a subtle 2.5 cm soft tissue mass in the mid-pancreas, abutting the splenic vein. This was biopsied x2, with cytology (NZB [id number]) showing adenocarcinoma. A peripancreatic fluid collection measuring over 5 cm was aspirated yielding 25 cc of chocolate colored fluid. Two or three scattered round hypoechoic lymph nodes were noted near the pancreatic tail but were not sampled.  In addition, on 11/28/2013 a chest x-ray obtained to evaluate upper respiratory symptoms showed a small to moderate left effusion. Chest CT scan 11/29/2013 confirmed a moderate left-sided pleural effusion with no suspicious appearing pulmonary nodules or masses associated with it. The complex cystic lesion associated with the tail of the pancreas was again noted, measuring 5.5 cm despite the recent aspiration procedure.  The patient's subsequent history is as detailed below.  INTERVAL HISTORY: [name] returns today for followup of her pancreatic cancer  accompanied by her husband [name]. I had called her regarding  the rapid rise in her CA 19 and we obtain an abdominal CT to evaluate this further. This really does not show any convincing area of recurrent or measurable disease. There is a small hypodensity in the remaining pancreatic tissue, which did not light up on PET scan. Everything else looks unremarkable.  REVIEW OF SYSTEMS: As far as the pancreatic cancer issue is concerned, she is entirely asymptomatic. She is back to her "normal colitis pattern", and she prefers that to being constipated. Her eyes have been watering a little. She is having some sinus symptoms and it could be that her tear ducts are currently clogged. She is already on anti-histamines and hydrate herself well. We considered adding hot herbal teas. Aside from these issues and some anxiety, a detailed review of systems today was stable  PAST MEDICAL HISTORY: Past Medical History  Diagnosis Date  . Macular degeneration   . Fluid retention   . Colitis, collagenous   . Vitamin D deficiency   . Osteopenia   . Celiac artery stenosis   . Splenic infarction   . Arthritis   . History of blood clots   . Anxiety   . Shingles   . Pancreatitis   . Osteoporosis   . Clotting disorder   . Arrhythmia     "skips a beat"  [place]  heart  . GERD (gastroesophageal reflux disease)   . Breast cancer     rt lumpectomy    PAST SURGICAL HISTORY: Past Surgical History  Procedure Laterality Date  . Foot surgery  2003    x 3, 2 on right, 1 on left  . Breast lumpectomy Right     radiation for 6 weeks  . Meniscus repair Right   . Eus N/A 11/23/2013    Procedure: UPPER ENDOSCOPIC ULTRASOUND (EUS) LINEAR;  Surgeon: Milus Banister, MD;  Location: [facility];  Service: Endoscopy;  Laterality: N/A;  . Tonsillectomy    . Laparoscopy N/A 12/20/2013    Procedure: LAPAROSCOPY DIAGNOSTIC ;  Surgeon: [name], MD;  Location: [place];  Service: General;  Laterality: N/A;  . Cholecystectomy  N/A 12/20/2013    Procedure: LAPAROSCOPIC CHOLECYSTECTOMY;  Surgeon: [name], MD;  Location: [place];  Service: General;  Laterality: N/A;  . Splenectomy, total N/A 12/20/2013    Procedure: SPLENECTOMY;  Surgeon: [name], MD;  Location: [place];  Service: General;  Laterality: N/A;  . Partial gastrectomy N/A 12/20/2013    Procedure: PARTIAL GASTRECTOMY;  Surgeon: [name], MD;  Location: [place];  Service: General;  Laterality: N/A;  . Portacath placement N/A 01/15/2014    Procedure: INSERTION PORT-A-CATH;  Surgeon: [name], MD;  Location: [facility];  Service: General;  Laterality: N/A;    FAMILY HISTORY Family History  Problem Relation Age of Onset  . Colon cancer Neg Hx   . Aneurysm Mother   . Stroke Mother   . Prostate cancer Father 55  . Skin cancer Brother 52    treated with interferon for 1 year  . Heart failure Paternal Aunt   . Breast cancer Paternal Aunt     dx over 38s  . Pancreatic cancer Maternal Grandmother     dx in her 86s  . Breast cancer Maternal Aunt     dx over 59  . Rheum arthritis Brother   . Breast cancer Paternal Aunt     dx over 32s  . Leukemia Paternal Aunt    the patient's father had a diagnosis of prostate cancer metastatic  to the liver or a died at age 28. The patient's mother died secondary to carotid third brain aneurysm at the age of 64. The patient had 2 brothers, no sisters. One brother has a history of melanoma. The patient's mother is mother was diagnosed with pancreatic cancer in her 65s. There is a history of breast cancer and second degree relatives, none before the age of 74  GYNECOLOGIC HISTORY:  Menarche age 69, first live birth age 10, the patient is [place] P2.. Stopped in her early 15s. 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 Her husband of 6 years, [name], retired from [employer] and currently works  part-time as a Cabin crew with [employer]. Daughter Marita [place] teaches first grade. Daughter Leafy Ro is a homemaker. The patient has 4 grandchildren [name] has an additional 5 grandchildren of his own). The patient attends a [employer]    ADVANCED DIRECTIVES: In place   HEALTH MAINTENANCE: History  Substance Use Topics  . Smoking status: Former Smoker -- 0.50 packs/day for 28 years    Types: Cigarettes    Quit date: 11/02/2000  . Smokeless tobacco: Never Used  . Alcohol Use: Yes     Comment: occ wine      Colonoscopy:  PAP:  Bone density: 09/21/2014, showing osteopenia with a T score of -1.7  Lipid panel:  Mammography: 09/21/2014 unremarkable  Allergies  Allergen Reactions  . Tape     Allergic  To  Tegaderm.  . Codeine Nausea And Vomiting  . Lactose Intolerance (Gi)   . Tequin     Severe stomach pain  . Vancomycin Itching    Whelps on arm of Infusing IV site  . Penicillins Nausea Only and Rash    Current Outpatient Prescriptions  Medication Sig Dispense Refill  . ALPRAZolam (XANAX) 0.5 MG tablet Take 0.5 mg by mouth at bedtime.    Marland Kitchen antiseptic oral rinse (BIOTENE) LIQD 15 mLs by Mouth Rinse route as needed for dry mouth.    . Ascorbic Acid (VITAMIN C PO) Take 1 tablet by mouth 2 (two) times daily.    Marland Kitchen aspirin EC 81 MG tablet Take 81 mg by mouth every morning.     Marland Kitchen b complex vitamins tablet Take 1 tablet by mouth every morning.     . Biotin 1 MG CAPS Take 1 capsule by mouth daily at 12 noon.     Marland Kitchen CREON 12000 UNITS CPEP capsule TAKE TWO CAPSULES THREE TIMES DAILY WITHMEALS 270 capsule 1  . diphenhydrAMINE (BENADRYL) 25 MG tablet Take 25 mg by mouth at bedtime.     . feeding supplement, RESOURCE BREEZE, (RESOURCE BREEZE) LIQD Take 1 Container by mouth 2 (two) times daily between meals. (Patient not taking: Reported on 10/18/2014) 30 Container 3  . furosemide (LASIX) 20 MG tablet Take 20 mg by mouth daily as needed for fluid.    Marland Kitchen lidocaine-prilocaine (EMLA) cream  Apply 1-2 hours to port a cath before procedure. (Patient not taking: Reported on 10/29/2014) 30 g 6  . lipase/protease/amylase (CREON) 12000 UNITS CPEP capsule TAKE TWO CAPSULES THREE TIMES DAILY WITHMEALS (Patient not taking: Reported on 10/18/2014) 270 capsule 1  . lipase/protease/amylase (CREON) 12000 UNITS CPEP capsule Take 24,000 Units by mouth 3 (three) times daily before meals.    Marland Kitchen loratadine (CLARITIN) 10 MG tablet Take 10 mg by mouth daily.    Marland Kitchen  meclizine (ANTIVERT) 25 MG tablet Take 1 tablet (25 mg total) by mouth 3 (three) times daily as needed for dizziness. (Patient not taking: Reported on 10/29/2014) 90 tablet 1  . Multiple Vitamins-Minerals (ICAPS AREDS FORMULA PO) Take 2 tablets by mouth every morning.     . Naproxen Sodium (ALEVE) 220 MG CAPS Take 440 mg by mouth 2 (two) times daily as needed (Pain).    Marland Kitchen omeprazole (PRILOSEC) 40 MG capsule Take 1 capsule (40 mg total) by mouth daily. (Patient taking differently: Take 40 mg by mouth every morning. ) 90 capsule 4  . PARoxetine (PAXIL) 10 MG tablet Take 10 mg by mouth every morning.    Marland Kitchen PARoxetine (PAXIL) 10 MG tablet TAKE ONE TABLET BY MOUTH ONCE DAILY (Patient not taking: Reported on 10/18/2014) 30 tablet 7  . valACYclovir (VALTREX) 1000 MG tablet Take 1 tablet (1,000 mg total) by mouth 2 (two) times daily. (Patient not taking: Reported on 10/18/2014) 90 tablet 1   No current facility-administered medications for this visit.    OBJECTIVE: Middle-aged white woman who appears stated age 58 Vitals:   01/04/15 1516  BP: 140/61  Pulse: 57  Temp: 98.4 F (36.9 C)  Resp: 18     Body mass index is 28.64 kg/(m^2).    ECOG FS:0 - Asymptomatic  Sclerae unicteric, no erythema, normal EOMs Oropharynx clear, good dentition No cervical or supraclavicular adenopathy Lungs no rales or rhonchi Heart regular rate and rhythm Abd soft, nontender, positive bowel sounds; no masses palpated MSK no focal spinal tenderness, no upper  extremity lymphedema Neuro: nonfocal, well oriented, positive affect Breasts: Deferred    LAB RESULTS: Results for AMARIYANA, HEACOX (MRN [id number]) as of 01/04/2015 16:20  Ref. Range 06/04/2014 08:14 07/10/2014 08:56 08/15/2014 09:01 11/08/2014 11:03 12/25/2014 14:54  CA 19-9 Latest Range: <35.0 U/mL 25.1 22.4 26.1 93.1 (H) 239.6 (H)      Component Value Date/Time   NA 143 12/25/2014 1454   NA 139 06/01/2014 1155   K 5.0 12/25/2014 1454   K 4.6 06/01/2014 1155   CL 100 06/01/2014 1155   CO2 27 12/25/2014 1454   CO2 27 06/01/2014 1155   GLUCOSE 155* 12/25/2014 1454   GLUCOSE 101* 06/01/2014 1155   BUN 15.7 12/25/2014 1454   BUN 13 06/01/2014 1155   CREATININE 0.8 12/25/2014 1454   CREATININE 0.59 06/01/2014 1155   CREATININE 0.62 11/28/2013 1635   CALCIUM 9.3 12/25/2014 1454   CALCIUM 9.5 06/01/2014 1155   PROT 6.6 12/25/2014 1454   PROT 5.0* 12/21/2013 0450   ALBUMIN 3.5 12/25/2014 1454   ALBUMIN 2.3* 12/21/2013 0450   AST 21 12/25/2014 1454   AST 42* 12/21/2013 0450   ALT 12 12/25/2014 1454   ALT 25 12/21/2013 0450   ALKPHOS 69 12/25/2014 1454   ALKPHOS 46 12/21/2013 0450   BILITOT 0.55 12/25/2014 1454   BILITOT 0.7 12/21/2013 0450   GFRNONAA >90 06/01/2014 1155   GFRAA >90 06/01/2014 1155    I No results found for: SPEP  Lab Results  Component Value Date   WBC 8.4 12/25/2014   NEUTROABS 5.3 12/25/2014   HGB 13.6 12/25/2014   HCT 38.3 12/25/2014   MCV 91.6 12/25/2014   PLT 277 12/25/2014      Chemistry      Component Value Date/Time   NA 143 12/25/2014 1454   NA 139 06/01/2014 1155   K 5.0 12/25/2014 1454   K 4.6 06/01/2014 1155   CL 100 06/01/2014 1155  CO2 27 12/25/2014 1454   CO2 27 06/01/2014 1155   BUN 15.7 12/25/2014 1454   BUN 13 06/01/2014 1155   CREATININE 0.8 12/25/2014 1454   CREATININE 0.59 06/01/2014 1155   CREATININE 0.62 11/28/2013 1635      Component Value Date/Time   CALCIUM 9.3 12/25/2014 1454   CALCIUM 9.5 06/01/2014 1155    ALKPHOS 69 12/25/2014 1454   ALKPHOS 46 12/21/2013 0450   AST 21 12/25/2014 1454   AST 42* 12/21/2013 0450   ALT 12 12/25/2014 1454   ALT 25 12/21/2013 0450   BILITOT 0.55 12/25/2014 1454   BILITOT 0.7 12/21/2013 0450     Lab Results  Component Value Date   LABCA2 8 06/08/2011    No components found for: [id number]  No results for input(s): INR in the last 168 hours.  Urinalysis    Component Value Date/Time   COLORURINE YELLOW 06/07/2011 1942   APPEARANCEUR CLEAR 06/07/2011 1942   LABSPEC 1.010 02/15/2014 1109   LABSPEC 1.009 06/07/2011 1942   PHURINE 6.0 06/07/2011 1942   GLUCOSEU Negative 02/15/2014 1109   GLUCOSEU NEGATIVE 06/07/2011 1942   HGBUR NEGATIVE 06/07/2011 1942   BILIRUBINUR neg 12/25/2011 1216   BILIRUBINUR NEGATIVE 06/07/2011 1942   KETONESUR NEGATIVE 06/07/2011 1942   PROTEINUR neg 12/25/2011 [place] 06/07/2011 1942   UROBILINOGEN 0.2 02/15/2014 1109   UROBILINOGEN negative 12/25/2011 1216   UROBILINOGEN 0.2 06/07/2011 1942   NITRITE Negative 02/15/2014 1109   NITRITE neg 12/25/2011 1216   NITRITE NEGATIVE 06/07/2011 1942   LEUKOCYTESUR Negative 12/25/2011 1216    STUDIES: Mr Abdomen W Wo Contrast  12/31/2014   CLINICAL DATA:  Subsequent encounter for pancreatic adenocarcinoma of the pancreas. Status post laparoscopic cholecystectomy with open extended distal pancreatectomy, splenectomy, and partial gastrectomy on 12/20/2013.  EXAM: MRI ABDOMEN WITHOUT AND WITH CONTRAST  TECHNIQUE: Multiplanar multisequence MR imaging of the abdomen was performed both before and after the administration of intravenous contrast.  CONTRAST:  64m MULTIHANCE GADOBENATE DIMEGLUMINE 529 MG/ML IV SOLN  COMPARISON:  PET-CT from 11/20/2014.  CT scan from 11/08/2014.  FINDINGS: Lower chest:  Unremarkable  Hepatobiliary: There is a very tiny focus of arterial phase hyper enhancement in be dome of the liver (segment 8). This measures about 7 mm in comparing back to  the old study from 09/03/2013, it does not appear substantially changed. This may represent a tiny flash filling hemangioma or vascular malformation. Differential perfusion is seen in the liver on arterial phase imaging with hyper enhancement of the left liver compared to the right. On delayed imaging, the hepatic veins all opacify normally, but the the left portal vein is quite diminutive but does appear to have flow. The gallbladder is surgically absent. There is no intrahepatic or extrahepatic biliary duct dilatation.  Pancreas: The uncinate process in the head of the pancreas remain in place, but the body and tail of the pancreas are surgically absent. 15 mm focus of hypoenhancement is seen in the anterior pancreatic head (see image 38 series 14 and image 38 series 15. This area of differential enhancement is also visible on the same images of series 16.  Spleen: Surgically absent.  Adrenals/Urinary Tract: No adrenal nodule or mass. Tiny cortical cysts noted in the lower pole left kidney. No hydronephrosis. No enhancing renal lesion.  Stomach/Bowel: Artifact along the greater curvature of the stomach is compatible with suture material. Duodenum is unremarkable. No evidence for small bowel dilatation within the visualized abdomen.  Vascular/Lymphatic: No  abdominal aortic aneurysm. Celiac axis and superior mesenteric artery are patent. Superior mesenteric vein is patent and while the portal splenic confluence appears slightly attenuated, the main portal vein is patent. As mentioned above, the left portal vein is very diminutive.  Other: No evidence for retroperitoneal lymphadenopathy. No evidence for gastrohepatic or hepatoduodenal ligament lymphadenopathy. No mesenteric lymphadenopathy.  Musculoskeletal: No abnormal marrow signal or enhancement within the visualized bony anatomy.  IMPRESSION: There is a small focus of hypoenhancement identified in the anterior head of the pancreas. This area was not  hypermetabolic on the recent PET-CT. Continued attention on followup imaging is recommended.  Tiny focus of hyper enhancement in the dome of the liver is stable back to 09/03/2013. This likely represents a tiny hemangioma or vascular malformation.   Electronically Signed   By: Misty Stanley M.D.   On: 12/31/2014 08:54     ASSESSMENT: 69 y.o. 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 noted on abdominal MRI November 2014  PANCREATIC CANCER: (3) status post endoscopic ultrasonography of the pancreas 11/23/2013 with cytology positive for adenocarcinoma, clinical stage T2 NX MX adenocarcinoma associated with a 5.5 cm pseudocyst (which was aspirated); baseline CA 19-9 was 47.0  (4) left pleural effusion status post thoracentesis 12/01/2013, cytology negative  (5) s/p laparoscopic cholecystectomy, open extended distal pancreatectomy, splenectomy, partial gastrectomy x2 12/20/2013 for a pT1 pN0, stage IA invasive ductal adenocarcinoma, grade 2, with positive resection margins  (a) "triple vaccination"(meningococcus, pneumococcus, Haemophilus) given 08/15/2014  (6) adjuvant gemcitabine started 02/01/2014-- received 3 weekly doses before radiation, will reeive 9 additional doses starting 3-5 weeks after completion of radiation  (7) adjuvant radiation started 02/27/2014,  with radiosensitization (capecitabine 1000 mg po BID on radiation days); completed 04/06/2014  (8) adjuvant gemcitabine weekly resumed 05/07/2014,  completed 9 doses 07/10/2014  (9) rise in CA 19 noted 11/08/2014: CT scan of the abdomen and pelvis obtained the same day showed no evidence of disease recurrence; PET scan gender a 19 2016 and MRI of the abdomen 12/30/2014 show no measurable disease despite continuing CA 19 rise  PLAN: Kerrie very likely has an occult recurrence of her pancreatic cancer. I don't think the hypodense area in her remaining pancreas is likely to be the culprit, as it did not light up in the recent PET scan and it is certainly large enough to have lit up if it were cancerous. Perhaps she has microscopic disease in the liver all meant or peritoneum. At any rate I have no other explanation for continuing CA 19 rise.   We discussed this at length. We could do an endoscopic ultrasound which would give [place] more information about a more restricted area, but after discussing this with surgery, they would not go back in even if we documented pathologically that she has a pancreatic recurrence. Accordingly the question is whether we started chemotherapy now, to "treat a number", or wait  until have we have measurable disease. I have also consulted with my partners to subspecialize in pancreatic and liver tumors and their feeling is until there is measurable disease there is no point starting the chemotherapy, which is fairly toxic.  That likely will be FOLFIRInox or possibly Gemzar/Abraxane. We did not discuss the details of these agents. Yocelyn is certainly not looking forward to the need to have any more systemic chemotherapy but she knows it is likely in her relatively near future.  We then discussed follow-up. I am going to obtain a CA 19 on a monthly basis and were going to repeat an MRI of the abdomen in about 2 months. As soon as there is measurable disease we can start systemic  therapy. In the meantime it is going to be very difficult for her not to let the worry and anxiety regarding this take over there is she is doing a very good job at living an normal life despite this threat.  She has a good understanding of their overall plan. She agrees with it. She will call with any problems that may develop before her next visit here.  Chauncey Cruel, MD   01/04/2015 4:22 PM

## 2015-01-04 NOTE — Telephone Encounter (Signed)
appts made and avs pritned for pt,no scan orders entered yet will hold pof and ck 3/7   anne

## 2015-01-07 ENCOUNTER — Telehealth: Payer: Self-pay | Admitting: Oncology

## 2015-01-07 NOTE — Telephone Encounter (Signed)
Order is in for mri and per [facility] they need all mri pts to call due to screening ?s,s/w pts husband and he is aware and will have her call  anne

## 2015-01-08 ENCOUNTER — Other Ambulatory Visit: Payer: Self-pay | Admitting: Internal Medicine

## 2015-01-08 NOTE — Telephone Encounter (Signed)
Refill x 6 months 

## 2015-01-08 NOTE — Telephone Encounter (Signed)
Alprazolam refilled.  

## 2015-01-09 ENCOUNTER — Other Ambulatory Visit: Payer: Self-pay | Admitting: Oncology

## 2015-01-15 ENCOUNTER — Encounter: Payer: Self-pay | Admitting: Internal Medicine

## 2015-01-15 ENCOUNTER — Ambulatory Visit (INDEPENDENT_AMBULATORY_CARE_PROVIDER_SITE_OTHER): Payer: Medicare Other | Admitting: Internal Medicine

## 2015-01-15 VITALS — BP 126/82 | HR 73 | Temp 98.7°F | Wt 168.0 lb

## 2015-01-15 DIAGNOSIS — F411 Generalized anxiety disorder: Secondary | ICD-10-CM

## 2015-01-15 MED ORDER — PAROXETINE HCL 20 MG PO TABS
20.0000 mg | ORAL_TABLET | Freq: Every day | ORAL | Status: DC
Start: 2015-01-15 — End: 2015-03-23

## 2015-01-15 MED ORDER — ALPRAZOLAM 0.5 MG PO TABS: 0.5000 mg | ORAL_TABLET | Freq: Every evening | ORAL | Status: AC | PRN

## 2015-01-15 NOTE — Patient Instructions (Signed)
Increase Xanax to 3 times a day as needed. Increase Paxil to 20 mg daily.

## 2015-01-15 NOTE — Progress Notes (Signed)
   Subjective:    Patient ID: Annette Beltran, female    DOB: [birth year], 69 y.o.   MRN: [id number]  HPI  Patient has a history of pancreatic cancer and is being followed by Dr. Jana Hakim at the [facility]. She has lots of questions. Her anxiety has gotten much worse since she found out her CA 19-9 had increased from 93.1 to 239.6. She had a recent MRI of her abdomen. No definite recurrence of the pancreatic cancer was found. However, Dr. Randell Patient not found it worrisome that her CA 19-9 had increased to more than double the amount 2 months ago. There was discussion about chemotherapy. However he would like to continue to monitor her CA 19-9 and will consider repeating her MRI a couple of months. She is only taking Xanax at night. She is not sleeping well. She's been anxious and not able to get much done around the house. Husband's health is not good either. She would like to increase her Paxil. She is currently on 10 mg daily. I think she also needs to increase her Xanax and take it more frequently.      Review of Systems     Objective:   Physical Exam  Patient not examined today. Spent 20 minutes talking with her and trying to answer her questions. She's been reading her reports on My Chart      Assessment & Plan:  Anxiety  History of pancreatic cancer  Plan: I have suggested she consider taking Xanax more frequently rather than just at bedtime. She is worried about being addicted to that but I told her not to worry at this point. I would prefer that she remain calm. She should take the Xanax before having her MRI and before her doctor's appointments and I would consider taking it up to 3 times daily. She may need to titrate it and see what dose works best for her. Have given her new prescription for Xanax. Were also going to increase Paxil from 10-20 mg daily. She knows she may contact me with questions through my chart

## 2015-01-29 ENCOUNTER — Encounter: Payer: Self-pay | Admitting: Oncology

## 2015-01-30 ENCOUNTER — Other Ambulatory Visit (HOSPITAL_BASED_OUTPATIENT_CLINIC_OR_DEPARTMENT_OTHER): Payer: Medicare Other

## 2015-01-30 ENCOUNTER — Other Ambulatory Visit: Payer: Self-pay | Admitting: *Deleted

## 2015-01-30 DIAGNOSIS — C251 Malignant neoplasm of body of pancreas: Secondary | ICD-10-CM

## 2015-01-30 DIAGNOSIS — C252 Malignant neoplasm of tail of pancreas: Secondary | ICD-10-CM | POA: Diagnosis not present

## 2015-01-30 DIAGNOSIS — C259 Malignant neoplasm of pancreas, unspecified: Secondary | ICD-10-CM

## 2015-01-30 DIAGNOSIS — C50911 Malignant neoplasm of unspecified site of right female breast: Secondary | ICD-10-CM

## 2015-01-30 LAB — CBC WITH DIFFERENTIAL/PLATELET
BASO%: 0.9 % (ref 0.0–2.0)
Basophils Absolute: 0.1 10*3/uL (ref 0.0–0.1)
EOS%: 3.5 % (ref 0.0–7.0)
Eosinophils Absolute: 0.3 10*3/uL (ref 0.0–0.5)
HEMATOCRIT: 43.2 % (ref 34.8–46.6)
HGB: 14.8 g/dL (ref 11.6–15.9)
LYMPH%: 12.4 % — AB (ref 14.0–49.7)
MCH: 32.1 pg (ref 25.1–34.0)
MCHC: 34.2 g/dL (ref 31.5–36.0)
MCV: 93.9 fL (ref 79.5–101.0)
MONO#: 1 10*3/uL — AB (ref 0.1–0.9)
MONO%: 12.2 % (ref 0.0–14.0)
NEUT#: 6.1 10*3/uL (ref 1.5–6.5)
NEUT%: 71 % (ref 38.4–76.8)
Platelets: 328 10*3/uL (ref 145–400)
RBC: 4.6 10*6/uL (ref 3.70–5.45)
RDW: 12.2 % (ref 11.2–14.5)
WBC: 8.6 10*3/uL (ref 3.9–10.3)
lymph#: 1.1 10*3/uL (ref 0.9–3.3)

## 2015-01-30 LAB — COMPREHENSIVE METABOLIC PANEL (CC13)
ALBUMIN: 3.7 g/dL (ref 3.5–5.0)
ALT: 17 U/L (ref 0–55)
AST: 20 U/L (ref 5–34)
Alkaline Phosphatase: 75 U/L (ref 40–150)
Anion Gap: 11 mEq/L (ref 3–11)
BILIRUBIN TOTAL: 0.64 mg/dL (ref 0.20–1.20)
BUN: 16.5 mg/dL (ref 7.0–26.0)
CALCIUM: 9.1 mg/dL (ref 8.4–10.4)
CO2: 25 mEq/L (ref 22–29)
Chloride: 104 mEq/L (ref 98–109)
Creatinine: 0.8 mg/dL (ref 0.6–1.1)
EGFR: 70 mL/min/{1.73_m2} — AB (ref >90)
GLUCOSE: 156 mg/dL — AB (ref 70–140)
Potassium: 4.8 mEq/L (ref 3.5–5.1)
SODIUM: 140 meq/L (ref 136–145)
TOTAL PROTEIN: 7.1 g/dL (ref 6.4–8.3)

## 2015-01-30 LAB — CANCER ANTIGEN 19-9: CA 19 9: 546.9 U/mL — AB (ref <35.0)

## 2015-01-31 ENCOUNTER — Other Ambulatory Visit: Payer: Self-pay | Admitting: Oncology

## 2015-01-31 DIAGNOSIS — C259 Malignant neoplasm of pancreas, unspecified: Secondary | ICD-10-CM

## 2015-01-31 NOTE — Progress Notes (Unsigned)
Annette Beltran with the results of the lab work. The rise in CA-19-9 is very worrisome. It really does mean almost certainly mean that there is occult tumor somewhere. We talked about doing an MRI now or doing an MRI late April. I think we have a better chance of finding it late April and if we found that a month earlier I do not think that would make any difference to her over all prognosis. What is cannot make a difference is how the tumor actually response to chemotherapy when we start treating it.   She would prefer to wait until April that is what we are doing but if she changes her mind she will let me know.

## 2015-02-26 ENCOUNTER — Telehealth: Payer: Self-pay | Admitting: *Deleted

## 2015-02-26 ENCOUNTER — Other Ambulatory Visit: Payer: Self-pay | Admitting: Oncology

## 2015-02-26 ENCOUNTER — Other Ambulatory Visit: Payer: Self-pay | Admitting: *Deleted

## 2015-02-26 DIAGNOSIS — C251 Malignant neoplasm of body of pancreas: Secondary | ICD-10-CM

## 2015-02-26 DIAGNOSIS — C50911 Malignant neoplasm of unspecified site of right female breast: Secondary | ICD-10-CM

## 2015-02-26 NOTE — Telephone Encounter (Signed)
Per Dr. Jana Hakim, cancel 02/27/15 labs. Schedule labs for 03/15/15 and MD appointment for 03/22/15 at 1:00 pm for one hour. POF sent to scheduling.

## 2015-02-26 NOTE — Progress Notes (Signed)
This RN spoke with patient and told her to call [facility] to schedule MRI of abdomen. MRI needs to be done before seeing Dr. Jana Hakim. Patient is to call [facility] and tell us the date and time of MRI. The appointment with Dr. Jana Hakim may need to be rescheduled. Patient verbalized understanding.

## 2015-02-26 NOTE — Telephone Encounter (Signed)
Patient left a voice message stating,"my MRI is Saturday, May 14th. I have to do an open MRI and that's the first available. I have an appointment with Dr. Jana Hakim 03/06/15 and that needs to be rescheduled. Do I still need to have labs drawn tomorrow, 02/27/15?" Return number is [phone]. This message to be given to Dr. Jana Hakim.

## 2015-02-26 NOTE — Telephone Encounter (Signed)
PT. THOUGHT SHE WOULD HAVE A MRI BEFORE THE VISIT WITH DR.MAGRINAT. THE ORDER FOR A MRI OF THE ABDOMEN NEEDS TO BE CHANGED TO WITH AND WITHOUT CONTRAST PER [facility] BEFORE PT. CAN SCHEDULE. NOTIFIED STACEY CAMP,RN.

## 2015-02-27 ENCOUNTER — Other Ambulatory Visit: Payer: Medicare Other

## 2015-03-06 ENCOUNTER — Ambulatory Visit: Payer: Medicare Other | Admitting: Oncology

## 2015-03-12 ENCOUNTER — Ambulatory Visit (INDEPENDENT_AMBULATORY_CARE_PROVIDER_SITE_OTHER): Payer: Medicare Other | Admitting: Internal Medicine

## 2015-03-12 ENCOUNTER — Encounter: Payer: Self-pay | Admitting: Internal Medicine

## 2015-03-12 VITALS — BP 118/70 | HR 60 | Temp 98.2°F | Wt 170.0 lb

## 2015-03-12 DIAGNOSIS — H9202 Otalgia, left ear: Secondary | ICD-10-CM | POA: Diagnosis not present

## 2015-03-12 MED ORDER — AZITHROMYCIN 250 MG PO TABS: ORAL_TABLET | ORAL | Status: DC

## 2015-03-12 NOTE — Progress Notes (Signed)
   Subjective:    Patient ID: Annette Beltran, female    DOB: 12/07/1945, 69 y.o.   MRN: [id number]  HPI Onset yesterday of shooting pain in left ear. Did not sleep well last night. Seems to come every few seconds. Has not been swimming. No water in ear. No irritation of external ear canal that she is aware of. Says she has no history of left TMJ syndrome. History of pancreatic cancer. Due for MRI this coming weekend.    Review of Systems     Objective:   Physical Exam  Some popping of left TM joint. Patient denies significant tenderness. Left TM is full but not red. Right TM clear. Pharynx is clear. Neck is supple. Left external canal clear.      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 Apply ice to left TM joint 20 minutes twice daily. If not better in 203 days we will refer you to ENT physician.

## 2015-03-12 NOTE — Patient Instructions (Signed)
Apply ice to left TM joint 20 minutes twice daily. Zithromax Z-PAK take as directed. If not better in 2 or 3 days we will refer you to ENT physician.

## 2015-03-15 ENCOUNTER — Other Ambulatory Visit (HOSPITAL_BASED_OUTPATIENT_CLINIC_OR_DEPARTMENT_OTHER): Payer: Medicare Other

## 2015-03-15 DIAGNOSIS — Z853 Personal history of malignant neoplasm of breast: Secondary | ICD-10-CM | POA: Diagnosis not present

## 2015-03-15 DIAGNOSIS — C50911 Malignant neoplasm of unspecified site of right female breast: Secondary | ICD-10-CM

## 2015-03-15 DIAGNOSIS — C251 Malignant neoplasm of body of pancreas: Secondary | ICD-10-CM

## 2015-03-15 DIAGNOSIS — C252 Malignant neoplasm of tail of pancreas: Secondary | ICD-10-CM

## 2015-03-15 LAB — COMPREHENSIVE METABOLIC PANEL (CC13)
ALT: 18 U/L (ref 0–55)
AST: 23 U/L (ref 5–34)
Albumin: 3.6 g/dL (ref 3.5–5.0)
Alkaline Phosphatase: 79 U/L (ref 40–150)
Anion Gap: 12 mEq/L — ABNORMAL HIGH (ref 3–11)
BUN: 20.8 mg/dL (ref 7.0–26.0)
CALCIUM: 8.9 mg/dL (ref 8.4–10.4)
CO2: 24 mEq/L (ref 22–29)
Chloride: 103 mEq/L (ref 98–109)
Creatinine: 0.8 mg/dL (ref 0.6–1.1)
EGFR: 71 mL/min/{1.73_m2} — ABNORMAL LOW (ref >90)
GLUCOSE: 152 mg/dL — AB (ref 70–140)
Potassium: 4.3 mEq/L (ref 3.5–5.1)
SODIUM: 139 meq/L (ref 136–145)
Total Bilirubin: 0.53 mg/dL (ref 0.20–1.20)
Total Protein: 7 g/dL (ref 6.4–8.3)

## 2015-03-15 LAB — CBC WITH DIFFERENTIAL/PLATELET
BASO%: 0.8 % (ref 0.0–2.0)
Basophils Absolute: 0.1 10*3/uL (ref 0.0–0.1)
EOS ABS: 0.4 10*3/uL (ref 0.0–0.5)
EOS%: 4.4 % (ref 0.0–7.0)
HCT: 40.3 % (ref 34.8–46.6)
HGB: 14.5 g/dL (ref 11.6–15.9)
LYMPH%: 17.1 % (ref 14.0–49.7)
MCH: 33 pg (ref 25.1–34.0)
MCHC: 36 g/dL (ref 31.5–36.0)
MCV: 91.8 fL (ref 79.5–101.0)
MONO#: 0.8 10*3/uL (ref 0.1–0.9)
MONO%: 9.6 % (ref 0.0–14.0)
NEUT#: 5.8 10*3/uL (ref 1.5–6.5)
NEUT%: 68.1 % (ref 38.4–76.8)
PLATELETS: 307 10*3/uL (ref 145–400)
RBC: 4.39 10*6/uL (ref 3.70–5.45)
RDW: 13 % (ref 11.2–14.5)
WBC: 8.5 10*3/uL (ref 3.9–10.3)
lymph#: 1.5 10*3/uL (ref 0.9–3.3)

## 2015-03-16 ENCOUNTER — Ambulatory Visit
Admission: RE | Admit: 2015-03-16 | Discharge: 2015-03-16 | Disposition: A | Discharge status: Home / Self Care | Payer: Medicare Other | Source: Ambulatory Visit | Attending: Oncology | Admitting: Oncology

## 2015-03-16 DIAGNOSIS — C251 Malignant neoplasm of body of pancreas: Secondary | ICD-10-CM

## 2015-03-16 DIAGNOSIS — C50911 Malignant neoplasm of unspecified site of right female breast: Secondary | ICD-10-CM

## 2015-03-16 LAB — CANCER ANTIGEN 19-9: CA 19-9: 906.3 U/mL — ABNORMAL HIGH (ref <35.0)

## 2015-03-16 MED ORDER — GADOBENATE DIMEGLUMINE 529 MG/ML IV SOLN
15.0000 mL | Freq: Once | INTRAVENOUS | Status: AC | PRN
  Administered 2015-03-16: 15 mL via INTRAVENOUS

## 2015-03-22 ENCOUNTER — Telehealth: Payer: Self-pay | Admitting: Oncology

## 2015-03-22 ENCOUNTER — Encounter: Payer: Self-pay | Admitting: *Deleted

## 2015-03-22 ENCOUNTER — Ambulatory Visit (HOSPITAL_BASED_OUTPATIENT_CLINIC_OR_DEPARTMENT_OTHER): Payer: Medicare Other | Admitting: Oncology

## 2015-03-22 VITALS — BP 150/52 | HR 46 | Temp 97.8°F | Resp 18 | Ht 64.5 in | Wt 170.5 lb

## 2015-03-22 DIAGNOSIS — C251 Malignant neoplasm of body of pancreas: Secondary | ICD-10-CM | POA: Diagnosis present

## 2015-03-22 DIAGNOSIS — Z853 Personal history of malignant neoplasm of breast: Secondary | ICD-10-CM | POA: Diagnosis not present

## 2015-03-22 DIAGNOSIS — J90 Pleural effusion, not elsewhere classified: Secondary | ICD-10-CM | POA: Diagnosis not present

## 2015-03-22 DIAGNOSIS — C259 Malignant neoplasm of pancreas, unspecified: Secondary | ICD-10-CM

## 2015-03-22 NOTE — Telephone Encounter (Signed)
Gave avs & calendar for July °

## 2015-03-22 NOTE — Progress Notes (Signed)
03/22/15 at 3:27pm - Dr. Jana Hakim asked that a research nurse screen this pt for possible enrollment into the SWOG S1313 study.  He knew that she doesn't have measurable disease now, but she could develop measurable disease in the future.  The pt has a history of stage 1 breast cancer, but she is still eligible for study entry based on her prior cancer.  The pt also has a history of collagenous colitis, but Dr. Jana Hakim states that she does not require any chronic treatment of steroids.  Also, the pt's problem list states the pt has a history of "clotting disorder", but Dr. Jana Hakim states that he is not aware of a known hematology problem.  He will contact the research department if she becomes eligible in the future.

## 2015-03-23 MED ORDER — PAROXETINE HCL 20 MG PO TABS: 40.0000 mg | ORAL_TABLET | Freq: Every day | ORAL | Status: AC

## 2015-03-23 NOTE — Progress Notes (Signed)
[place]  Telephone:([phone] Fax:([phone]) [phone]     ID: Annette Beltran OB: 15-Mar-1946  MR#: [id number]  JYN#:[id number]  PCP: [name], MD GYN:   SU: [name] OTHER MD: Delfin Edis, [name], Marica [facility] (opth),   CHIEF COMPLAINT: Early stage pancreatic cancer resected with positive margins  CURRENT TREATMENT: Observation   PANCREATIC CANCER HISTORY: From the earlier intake note:  I formerly followed Annette Beltran for a stage I breast cancer, which was treated with surgery, radiation, and anti-estrogens. She was released from followup here in 2007. In addition, in 2012 she had abdominal pain leading to hospitalization. 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 The patient also has a history of collagenous colitis, which of course carries its own set of symptoms, making identification of her new attic problem more difficult.  Annette Beltran tells me she started "feeling bad" in September of 2014. There was abdominal discomfort localizing to the left upper o'clock on, worse with inspiration. CT angiography 08/21/2013 showed no clot and no significant abnormalities. Plain rib and left shoulder views were negative. CT abdomen and pelvis with contrast 08/22/2013 showed slight prominence of the pancreatic duct in the distal body and tail with a new small structure emanating from the tail of the pancreas consistent with a pseudocyst. Exudate near the tail of the pancreas suggested pancreatitis. MRI of the abdomen 09/03/2013 confirmed several ill-defined fluid collections between the gastric fundus and the splenic hilum surrounding the pancreatic tail. There was no evidence of a pancreatic mass. The pancreatic tail did enhanced following contrast. The splenic vein appeared  chronically thrombosed with a small amount of nonocclusive thrombus extending into the main and left portal veins.  On 09/26/2013 amylase was 237 and lipase 513.0, consistent with acute pancreatitis. These numbers subsequently dropped in on 11/28/2013 amylase was normal at 47 and lipase was mildly elevated at 110  With continuing left-sided abdominal discomfort, abdominal ultrasound 11/13/2013 showed multiple gallstones with no evidence of cholecystitis. Evaluation of the pancreas showed a hypoechoic lobulated focus measuring up to 7.4 cm felt to be nonspecific. Repeat abdominal CT 11/14/2013 showed diffuse pancreatic atrophy without evidence of a mass, again consistent with chronic pancreatitis. There was mild soft tissue stranding suspicious for mild superimposed acute pancreatitis. A new rim-enhancing fluid collection was noted in the gastrosplenic ligament measuring 4.5 cm, consistent with a pseudocyst. Finally on 11/23/2013, endoscopic ultrasonography under Oretha Caprice showed a subtle 2.5 cm soft tissue mass in the mid-pancreas, abutting the splenic vein. This was biopsied x2, with cytology ([id number]) showing adenocarcinoma. A peripancreatic fluid collection measuring over 5 cm was aspirated yielding 25 cc of chocolate colored fluid. Two or three scattered round hypoechoic lymph nodes were noted near the pancreatic tail but were not sampled.  In addition, on 11/28/2013 a chest x-ray obtained to evaluate upper respiratory symptoms showed a small to moderate left effusion. Chest CT scan 11/29/2013 confirmed a moderate left-sided pleural effusion with no suspicious appearing pulmonary nodules or masses associated with it. The complex cystic lesion associated with the tail of the pancreas was again noted, measuring 5.5 cm despite the recent aspiration procedure.  The patient's subsequent history is as detailed below.  INTERVAL HISTORY: Annette Beltran returns today for followup of her pancreatic cancer  accompanied by her husband [name] and HER-2 daughters. Since  her last visit here we have followed her CA 39, which continues to rise steeply. We also repeated an abdominal MRI which shows absolutely no suggestion of recurrent disease.  REVIEW OF SYSTEMS: Annette Beltran has no symptoms related to her pancreatic cancer problem. She has a good appetite, normal taste, no change in her pattern of bowel movements (she has a history of collagenous colitis), no abdominal cramps or pain or any abdominal masses that she is aware of. She was found to have significant macular degeneration involving the left eye.and has been told that "there is nothing more they can do" regarding that. She is sometimes short of breath when walking up stairs. She has hot flashes. She feels anxious, but denies depression. She is not exercising regularly. A detailed review of systems today was otherwise stable   PAST MEDICAL HISTORY: Past Medical History  Diagnosis Date  . Macular degeneration   . Fluid retention   . Colitis, collagenous   . Vitamin D deficiency   . Osteopenia   . Celiac artery stenosis   . Splenic infarction   . Arthritis   . History of blood clots   . Anxiety   . Shingles   . Pancreatitis   . Osteoporosis   . Clotting disorder   . Arrhythmia     "skips a beat"  [place]  heart  . GERD (gastroesophageal reflux disease)   . Breast cancer     rt lumpectomy    PAST SURGICAL HISTORY: Past Surgical History  Procedure Laterality Date  . Foot surgery  2003    x 3, 2 on right, 1 on left  . Breast lumpectomy Right     radiation for 6 weeks  . Meniscus repair Right   . Eus N/A 11/23/2013    Procedure: UPPER ENDOSCOPIC ULTRASOUND (EUS) LINEAR;  Surgeon: Milus Banister, MD;  Location: [facility];  Service: Endoscopy;  Laterality: N/A;  . Tonsillectomy    . Laparoscopy N/A 12/20/2013    Procedure: LAPAROSCOPY DIAGNOSTIC ;  Surgeon: [name], MD;  Location: [place];  Service: General;  Laterality: N/A;  .  Cholecystectomy N/A 12/20/2013    Procedure: LAPAROSCOPIC CHOLECYSTECTOMY;  Surgeon: [name], MD;  Location: [place];  Service: General;  Laterality: N/A;  . Splenectomy, total N/A 12/20/2013    Procedure: SPLENECTOMY;  Surgeon: [name], MD;  Location: [place];  Service: General;  Laterality: N/A;  . Partial gastrectomy N/A 12/20/2013    Procedure: PARTIAL GASTRECTOMY;  Surgeon: [name], MD;  Location: [place];  Service: General;  Laterality: N/A;  . Portacath placement N/A 01/15/2014    Procedure: INSERTION PORT-A-CATH;  Surgeon: [name], MD;  Location: [facility];  Service: General;  Laterality: N/A;    FAMILY HISTORY Family History  Problem Relation Age of Onset  . Colon cancer Neg Hx   . Aneurysm Mother   . Stroke Mother   . Prostate cancer Father 58  . Skin cancer Brother 25    treated with interferon for 1 year  . Heart failure Paternal Aunt   . Breast cancer Paternal Aunt     dx over 27s  . Pancreatic cancer Maternal Grandmother     dx in her 60s  . Breast cancer Maternal Aunt     dx over 67  . Rheum arthritis Brother   . Breast cancer Paternal Aunt     dx over 58s  . Leukemia Paternal Aunt    the patient's father had a diagnosis of prostate cancer  metastatic to the liver or a died at age 67. The patient's mother died secondary to carotid third brain aneurysm at the age of 55. The patient had 2 brothers, no sisters. One brother has a history of melanoma. The patient's mother is mother was diagnosed with pancreatic cancer in her 56s. There is a history of breast cancer and second degree relatives, none before the age of 45  GYNECOLOGIC HISTORY:  Menarche age 30, first live birth age 51, the patient is [place] P2.. Stopped in her early 39s. 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 Her husband of 73 years, [name], retired from [employer] and  currently works part-time as a Cabin crew with [employer]. Daughter Marita [place] teaches first grade. Daughter Leafy Ro is a homemaker. The patient has 4 grandchildren [name] has an additional 5 grandchildren of his own). The patient attends a [employer]    ADVANCED DIRECTIVES: In place   HEALTH MAINTENANCE: History  Substance Use Topics  . Smoking status: Former Smoker -- 0.50 packs/day for 28 years    Types: Cigarettes    Quit date: 11/02/2000  . Smokeless tobacco: Never Used  . Alcohol Use: Yes     Comment: occ wine      Colonoscopy:  PAP:  Bone density: 09/21/2014, showing osteopenia with a T score of -1.7  Lipid panel:  Mammography: 09/21/2014 unremarkable  Allergies  Allergen Reactions  . Tape     Allergic  To  Tegaderm.  . Codeine Nausea And Vomiting  . Lactose Intolerance (Gi)   . Tequin     Severe stomach pain  . Vancomycin Itching    Whelps on arm of Infusing IV site  . Penicillins Nausea Only and Rash    Current Outpatient Prescriptions  Medication Sig Dispense Refill  . ALPRAZolam (XANAX) 0.5 MG tablet Take 1 tablet (0.5 mg total) by mouth at bedtime as needed for anxiety. 90 tablet 3  . antiseptic oral rinse (BIOTENE) LIQD 15 mLs by Mouth Rinse route as needed for dry mouth.    . Ascorbic Acid (VITAMIN C PO) Take 1 tablet by mouth 2 (two) times daily.    Marland Kitchen aspirin EC 81 MG tablet Take 81 mg by mouth every morning.     Marland Kitchen azithromycin (ZITHROMAX) 250 MG tablet 2 po day 1 followed by one po days 2-5 6 tablet 0  . b complex vitamins tablet Take 1 tablet by mouth every morning.     . Biotin 1 MG CAPS Take 1 capsule by mouth daily at 12 noon.     Marland Kitchen CREON 12000 UNITS CPEP capsule TAKE TWO CAPSULES THREE TIMES DAILY WITH MEALS 270 capsule 6  . diphenhydrAMINE (BENADRYL) 25 MG tablet Take 25 mg by mouth at bedtime.     . furosemide (LASIX) 20 MG tablet Take 20 mg by mouth daily as needed for fluid.    Marland Kitchen loratadine (CLARITIN) 10 MG tablet Take 10 mg by mouth daily.     . magnesium 30 MG tablet Take 30 mg by mouth 2 (two) times daily.    . Multiple Vitamins-Minerals (ICAPS AREDS FORMULA PO) Take 2 tablets by mouth every morning.     Marland Kitchen omeprazole (PRILOSEC) 40 MG capsule Take 1 capsule (40 mg total) by mouth daily. (Patient not taking: Reported on 03/12/2015) 90 capsule 4  . PARoxetine (PAXIL) 20 MG tablet Take  1 tablet (20 mg total) by mouth daily. 30 tablet 5  . tobramycin (TOBREX) 0.3 % ophthalmic solution   0   No current facility-administered medications for this visit.    OBJECTIVE: Middle-aged white woman who appears stated age 68 Vitals:   03/22/15 1253  BP: 150/52  Pulse: 46  Temp: 97.8 F (36.6 C)  Resp: 18     Body mass index is 28.82 kg/(m^2).    ECOG FS:0 - Asymptomatic  Sclerae unicteric, no erythema, normal EOMs Oropharynx clear, good dentition No cervical or supraclavicular adenopathy Lungs no rales or rhonchi Heart regular rate and rhythm Abd soft, nontender, positive bowel sounds; no masses palpated MSK no focal spinal tenderness, no upper extremity lymphedema Neuro: nonfocal, well oriented, positive affect Breasts: Deferred    LAB RESULTS: Results for BRITANNI, YARDE (MRN [id number]) as of 03/23/2015 10:28  Ref. Range 08/15/2014 09:01 11/08/2014 11:03 12/25/2014 14:54 01/30/2015 14:02 03/15/2015 14:05  CA 19-9 Latest Ref Range: <35.0 U/mL 26.1 93.1 (H) 239.6 (H) 546.9 (H) 906.3 (H)      Component Value Date/Time   NA 139 03/15/2015 1405   NA 139 06/01/2014 1155   K 4.3 03/15/2015 1405   K 4.6 06/01/2014 1155   CL 100 06/01/2014 1155   CO2 24 03/15/2015 1405   CO2 27 06/01/2014 1155   GLUCOSE 152* 03/15/2015 1405   GLUCOSE 101* 06/01/2014 1155   BUN 20.8 03/15/2015 1405   BUN 13 06/01/2014 1155   CREATININE 0.8 03/15/2015 1405   CREATININE 0.59 06/01/2014 1155   CREATININE 0.62 11/28/2013 1635   CALCIUM 8.9 03/15/2015 1405   CALCIUM 9.5 06/01/2014 1155   PROT 7.0 03/15/2015 1405   PROT 5.0* 12/21/2013 0450    ALBUMIN 3.6 03/15/2015 1405   ALBUMIN 2.3* 12/21/2013 0450   AST 23 03/15/2015 1405   AST 42* 12/21/2013 0450   ALT 18 03/15/2015 1405   ALT 25 12/21/2013 0450   ALKPHOS 79 03/15/2015 1405   ALKPHOS 46 12/21/2013 0450   BILITOT 0.53 03/15/2015 1405   BILITOT 0.7 12/21/2013 0450   GFRNONAA >90 06/01/2014 1155   GFRAA >90 06/01/2014 1155    I No results found for: SPEP  Lab Results  Component Value Date   WBC 8.5 03/15/2015   NEUTROABS 5.8 03/15/2015   HGB 14.5 03/15/2015   HCT 40.3 03/15/2015   MCV 91.8 03/15/2015   PLT 307 03/15/2015      Chemistry      Component Value Date/Time   NA 139 03/15/2015 1405   NA 139 06/01/2014 1155   K 4.3 03/15/2015 1405   K 4.6 06/01/2014 1155   CL 100 06/01/2014 1155   CO2 24 03/15/2015 1405   CO2 27 06/01/2014 1155   BUN 20.8 03/15/2015 1405   BUN 13 06/01/2014 1155   CREATININE 0.8 03/15/2015 1405   CREATININE 0.59 06/01/2014 1155   CREATININE 0.62 11/28/2013 1635      Component Value Date/Time   CALCIUM 8.9 03/15/2015 1405   CALCIUM 9.5 06/01/2014 1155   ALKPHOS 79 03/15/2015 1405   ALKPHOS 46 12/21/2013 0450   AST 23 03/15/2015 1405   AST 42* 12/21/2013 0450   ALT 18 03/15/2015 1405   ALT 25 12/21/2013 0450   BILITOT 0.53 03/15/2015 1405   BILITOT 0.7 12/21/2013 0450     Lab Results  Component Value Date   LABCA2 8 06/08/2011    No components found for: [id number]  No results for input(s): INR in the last 168  hours.  Urinalysis    Component Value Date/Time   COLORURINE YELLOW 06/07/2011 1942   APPEARANCEUR CLEAR 06/07/2011 1942   LABSPEC 1.010 02/15/2014 1109   LABSPEC 1.009 06/07/2011 1942   PHURINE 6.5 02/15/2014 1109   PHURINE 6.0 06/07/2011 1942   GLUCOSEU Negative 02/15/2014 1109   GLUCOSEU NEGATIVE 06/07/2011 1942   HGBUR Negative 02/15/2014 1109   HGBUR NEGATIVE 06/07/2011 1942   BILIRUBINUR Negative 02/15/2014 1109   BILIRUBINUR neg 12/25/2011 1216   BILIRUBINUR NEGATIVE 06/07/2011 1942    KETONESUR Negative 02/15/2014 1109   KETONESUR NEGATIVE 06/07/2011 1942   PROTEINUR Negative 02/15/2014 1109   PROTEINUR neg 12/25/2011 1216   PROTEINUR NEGATIVE 06/07/2011 1942   UROBILINOGEN 0.2 02/15/2014 1109   UROBILINOGEN negative 12/25/2011 1216   UROBILINOGEN 0.2 06/07/2011 1942   NITRITE Negative 02/15/2014 1109   NITRITE neg 12/25/2011 1216   NITRITE NEGATIVE 06/07/2011 1942   LEUKOCYTESUR Negative 02/15/2014 1109   LEUKOCYTESUR Negative 12/25/2011 1216    STUDIES: Mr Abdomen W Wo Contrast  03/18/2015   CLINICAL DATA:  Breast cancer in 2002 and pancreatic cancer diagnosed in 2015. Status post partial pancreatectomy, cholecystectomy, splenectomy, and partial gastrectomy.  EXAM: MRI ABDOMEN WITHOUT AND WITH CONTRAST  TECHNIQUE: Multiplanar multisequence MR imaging of the abdomen was performed both before and after the administration of intravenous contrast.  CONTRAST:  63m MULTIHANCE GADOBENATE DIMEGLUMINE 529 MG/ML IV SOLN  COMPARISON:  MRI of 12/30/2014. PET of 11/20/2014. CT of 11/08/2014.  FINDINGS: Lower chest: Normal heart size without pericardial or pleural effusion.  Hepatobiliary: Heterogeneous hepatic steatosis. Atrophy involving the lateral left hepatic lobe could be radiation induced. This is similar. There is arterial hyper enhancement in the left hepatic lobe, including on image 22 of series 14. Also chronic. Likely due to relative diminutive left portal vein, including on image 30 of series 16. This has been present on prior exams. Focus of arterial hyper enhancement in the right lobe of the liver is similar on image 17 of series 14 and may represent a perfusion anomaly.  Cholecystectomy, without biliary ductal dilatation. No choledocholithiasis.  Pancreas: Surgical absence of the pancreatic body and tail. The pancreatic head and uncinate process remain. The previously described area of hypo enhancement within the anterior pancreatic head is less impressive today. Vague hypo  enhancement on image 62 of series 16 is favored to be within normal variation. No well-defined mass. No acute pancreatitis.  Spleen: Surgically absent  Adrenals/Urinary Tract: Normal adrenal glands. Bilateral tiny renal cysts. No hydronephrosis.  Stomach/Bowel: Underdistended proximal stomach. Normal abdominal bowel loops.  Vascular/Lymphatic: Chronic narrowing of the celiac origin, including back to 11/08/2014. Example image 40 of series 16. Superior mesenteric artery patent. Circumaortic left renal vein. No retroperitoneal or retrocrural adenopathy.  Other: No ascites. Gastroepiploic collaterals are likely due to splenic vein resection. No evidence of omental or peritoneal disease. No ascites. Incompletely imaged uterine fibroid.  Musculoskeletal: T2 hypointensity within upper lumbar and lower thoracic vertebral bodies is likely radiation induced. Example image 12 series 3.  IMPRESSION: 1. Status post distal pancreatectomy, splenectomy, and cholecystectomy, without locally recurrent or metastatic disease. The area of hypo enhancement within the pancreatic head is less impressive today, favored to be within normal variation. 2. Similar appearance of the liver. Left hepatic lobe atrophy and arterial hyper enhancement, likely due to a relatively diminutive left portal vein and radiation change. A focus of hyper enhancement within the high right lobe of the liver is similar and likely due to a perfusion anomaly.  3. Similar heterogeneous hepatic steatosis.   Electronically Signed   By: Abigail Miyamoto M.D.   On: 03/18/2015 08:51     ASSESSMENT: 69 y.o. 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 noted on abdominal MRI November 2014  PANCREATIC CANCER: (3) status post endoscopic ultrasonography of the pancreas 11/23/2013 with cytology positive for adenocarcinoma, clinical stage T2 NX MX adenocarcinoma associated with a 5.5 cm pseudocyst (which was aspirated); baseline CA 19-9 was 47.0  (4) left pleural effusion status post thoracentesis 12/01/2013, cytology negative  (5) s/p laparoscopic cholecystectomy, open extended distal pancreatectomy, splenectomy, partial gastrectomy x2 12/20/2013 for a pT1 pN0, stage IA invasive ductal adenocarcinoma, grade 2, with positive resection margins  (a) "triple vaccination"(meningococcus, pneumococcus, Haemophilus) given 08/15/2014  (6) adjuvant gemcitabine started 02/01/2014-- received 3 weekly doses before radiation, will reeive 9 additional doses starting 3-5 weeks after completion of radiation  (7) adjuvant radiation started 02/27/2014, with radiosensitization (capecitabine 1000 mg po BID on radiation days); completed 04/06/2014  (8) adjuvant gemcitabine weekly resumed 05/07/2014,  completed 9 doses 07/10/2014  (9) rise in CA 19 noted 11/08/2014: CT scan of the abdomen and pelvis obtained the same day showed no evidence of disease recurrence; PET scan 11/20/2014 and MRI of the abdomen 12/30/2014 and 03/16/2015 show no measurable disease despite continuing CA 19 rise  PLAN: I spent approximately an hour with Annette Beltran and her family going over her situation. The continuing rise in the CA 19 is strongly indicative of a occult pancreatic cancer. Although an elevated CA 19 may occasionally be associated with benign bile duct, gallbladder, or  liver problems, the higher the number and this deep further rise more specific it is for pancreatic cancer.  At the same time she has absolutely no symptoms related to this and repeated MRI of the abdomen shows no measurable disease.  I gave Shalin 3 options. One option was continued observation. She understands that if we do document recurrent disease, this is generally not curable. I am not aware of any data that suggests that starting treatment for a rising CA 19 in the absence of any measurable disease or symptoms improve survival. She is now doing well, and if she can do it this is a good time to visit family, take a trip, or daughter thinks that she may not be able to do later on.  A second option was to send her back to Dr. Barry Dienes for laparoscopy. This might document microscopic peritoneal carcinomatosis. If it does we could start treatment at that time, although in the absence of measurable disease she would not qualify for the SWOG [phone] study which would be wide I would treat her with if we did have measurable disease  A third option was to seek a second opinion at [facility] or [facility]. I reassured Jennaya that I have absolute no problem with that and that I would be glad to  help percent that up. Of course I would continue to be her  Oncologist.  I think after today's discussion the family understands the overall situation better. Liliahna has preference at this point is for continued observation. We will repeat a PET scan in 2 months. Of course if she develops any symptoms prior to that we would evaluate them aggressively. As soon as we have measurable disease she will be referred for the SWOG study (a preliminary review suggests that her remote history of breast cancer would not disqualify her).  She is interested in a second opinion regarding the macular degeneration problem and I will see if the [facility] can see her regarding this  Talaysha has a good understanding of their overall plan. She  agrees with it. She will call with any problems that may develop before her next visit here.  Chauncey Cruel, MD   03/23/2015 10:27 AM

## 2015-03-26 ENCOUNTER — Other Ambulatory Visit: Payer: Self-pay | Admitting: Oncology

## 2015-03-26 ENCOUNTER — Telehealth: Payer: Self-pay | Admitting: Oncology

## 2015-03-26 DIAGNOSIS — C250 Malignant neoplasm of head of pancreas: Secondary | ICD-10-CM

## 2015-03-26 NOTE — Telephone Encounter (Signed)
Scan order noted and will fax order to [facility] and they will call the patient

## 2015-04-05 ENCOUNTER — Other Ambulatory Visit: Payer: Self-pay

## 2015-04-05 ENCOUNTER — Telehealth: Payer: Self-pay | Admitting: Oncology

## 2015-04-05 DIAGNOSIS — H3530 Unspecified macular degeneration: Secondary | ICD-10-CM

## 2015-04-05 NOTE — Telephone Encounter (Signed)
Spoke with Annette Beltran about this referral for macular degeneration and this is for a second opinion non related to her cancer,she will have Val take care of this on [place]

## 2015-04-15 ENCOUNTER — Ambulatory Visit
Admission: RE | Admit: 2015-04-15 | Discharge: 2015-04-15 | Disposition: A | Discharge status: Home / Self Care | Payer: Medicare Other | Source: Ambulatory Visit | Attending: Oncology | Admitting: Oncology

## 2015-04-15 ENCOUNTER — Telehealth: Payer: Self-pay | Admitting: *Deleted

## 2015-04-15 ENCOUNTER — Other Ambulatory Visit: Payer: Self-pay | Admitting: *Deleted

## 2015-04-15 DIAGNOSIS — C250 Malignant neoplasm of head of pancreas: Secondary | ICD-10-CM

## 2015-04-15 MED ORDER — GADOBENATE DIMEGLUMINE 529 MG/ML IV SOLN
15.0000 mL | Freq: Once | INTRAVENOUS | Status: AC | PRN
  Administered 2015-04-15: 15 mL via INTRAVENOUS

## 2015-04-15 NOTE — Telephone Encounter (Signed)
Pt called requesting results of brain scan. States Dr Jana Hakim was going to call her as soon as results available. Will forward to Dr Jana Hakim

## 2015-04-17 ENCOUNTER — Other Ambulatory Visit: Payer: Self-pay | Admitting: Oncology

## 2015-04-18 ENCOUNTER — Telehealth: Payer: Self-pay

## 2015-04-18 NOTE — Telephone Encounter (Signed)
Let pt know MRI is good - verbalized understanding.

## 2015-05-10 ENCOUNTER — Telehealth: Payer: Self-pay | Admitting: *Deleted

## 2015-05-10 NOTE — Telephone Encounter (Signed)
TC from patient requesting that Dr. Jana Hakim call her as soon as possible after Monday's (05/13/15) scans with results.  She is anxious about results and does not want to wait the week until her appointment with him to get results.

## 2015-05-13 ENCOUNTER — Ambulatory Visit (HOSPITAL_COMMUNITY)
Admission: RE | Admit: 2015-05-13 | Discharge: 2015-05-13 | Disposition: A | Discharge status: Home / Self Care | Payer: Medicare Other | Source: Ambulatory Visit | Attending: Oncology | Admitting: Oncology

## 2015-05-13 ENCOUNTER — Other Ambulatory Visit (HOSPITAL_BASED_OUTPATIENT_CLINIC_OR_DEPARTMENT_OTHER): Payer: Medicare Other

## 2015-05-13 ENCOUNTER — Other Ambulatory Visit: Payer: Self-pay | Admitting: Oncology

## 2015-05-13 DIAGNOSIS — C259 Malignant neoplasm of pancreas, unspecified: Secondary | ICD-10-CM | POA: Diagnosis present

## 2015-05-13 DIAGNOSIS — Z79899 Other long term (current) drug therapy: Secondary | ICD-10-CM | POA: Insufficient documentation

## 2015-05-13 DIAGNOSIS — Z9221 Personal history of antineoplastic chemotherapy: Secondary | ICD-10-CM | POA: Diagnosis not present

## 2015-05-13 DIAGNOSIS — Z853 Personal history of malignant neoplasm of breast: Secondary | ICD-10-CM

## 2015-05-13 DIAGNOSIS — R935 Abnormal findings on diagnostic imaging of other abdominal regions, including retroperitoneum: Secondary | ICD-10-CM | POA: Diagnosis not present

## 2015-05-13 DIAGNOSIS — C251 Malignant neoplasm of body of pancreas: Secondary | ICD-10-CM

## 2015-05-13 DIAGNOSIS — Z923 Personal history of irradiation: Secondary | ICD-10-CM | POA: Diagnosis not present

## 2015-05-13 LAB — CBC WITH DIFFERENTIAL/PLATELET
BASO%: 1.1 % (ref 0.0–2.0)
Basophils Absolute: 0.1 10*3/uL (ref 0.0–0.1)
EOS%: 5.4 % (ref 0.0–7.0)
Eosinophils Absolute: 0.5 10*3/uL (ref 0.0–0.5)
HCT: 41.7 % (ref 34.8–46.6)
HGB: 14.6 g/dL (ref 11.6–15.9)
LYMPH%: 13.3 % — AB (ref 14.0–49.7)
MCH: 32.1 pg (ref 25.1–34.0)
MCHC: 35 g/dL (ref 31.5–36.0)
MCV: 91.9 fL (ref 79.5–101.0)
MONO#: 1 10*3/uL — ABNORMAL HIGH (ref 0.1–0.9)
MONO%: 10.9 % (ref 0.0–14.0)
NEUT%: 69.3 % (ref 38.4–76.8)
NEUTROS ABS: 6.1 10*3/uL (ref 1.5–6.5)
Platelets: 341 10*3/uL (ref 145–400)
RBC: 4.54 10*6/uL (ref 3.70–5.45)
RDW: 12.3 % (ref 11.2–14.5)
WBC: 8.8 10*3/uL (ref 3.9–10.3)
lymph#: 1.2 10*3/uL (ref 0.9–3.3)

## 2015-05-13 LAB — COMPREHENSIVE METABOLIC PANEL (CC13)
ALT: 24 U/L (ref 0–55)
AST: 28 U/L (ref 5–34)
Albumin: 3.5 g/dL (ref 3.5–5.0)
Alkaline Phosphatase: 73 U/L (ref 40–150)
Anion Gap: 7 mEq/L (ref 3–11)
BUN: 14.3 mg/dL (ref 7.0–26.0)
CALCIUM: 9.3 mg/dL (ref 8.4–10.4)
CO2: 28 meq/L (ref 22–29)
Chloride: 106 mEq/L (ref 98–109)
Creatinine: 0.8 mg/dL (ref 0.6–1.1)
EGFR: 77 mL/min/{1.73_m2} — AB (ref >90)
Glucose: 140 mg/dl (ref 70–140)
Potassium: 4.7 mEq/L (ref 3.5–5.1)
Sodium: 141 mEq/L (ref 136–145)
TOTAL PROTEIN: 6.7 g/dL (ref 6.4–8.3)
Total Bilirubin: 0.99 mg/dL (ref 0.20–1.20)

## 2015-05-13 LAB — GLUCOSE, CAPILLARY: GLUCOSE-CAPILLARY: 139 mg/dL — AB (ref 65–99)

## 2015-05-13 MED ORDER — FLUDEOXYGLUCOSE F - 18 (FDG) INJECTION
8.4700 | Freq: Once | INTRAVENOUS | Status: AC | PRN
  Administered 2015-05-13: 8.47 via INTRAVENOUS

## 2015-05-14 LAB — CANCER ANTIGEN 19-9: CA 19-9: 2259.1 U/mL — ABNORMAL HIGH (ref <35.0)

## 2015-05-16 ENCOUNTER — Other Ambulatory Visit: Payer: Self-pay | Admitting: Oncology

## 2015-05-20 ENCOUNTER — Ambulatory Visit: Payer: Medicare Other | Admitting: Oncology

## 2015-05-20 ENCOUNTER — Telehealth: Payer: Self-pay | Admitting: *Deleted

## 2015-05-20 NOTE — Telephone Encounter (Signed)
Patient called stating she "will not be here today but Friday with spouse [name] but MyChart keeps sending a message that she is to come in today.   I will come 05-24-2015 at 10:00 as scheduled with my husband.  We have a dual appointment."  Will notify Dr. Jana Hakim and staff.

## 2015-05-24 ENCOUNTER — Ambulatory Visit (HOSPITAL_BASED_OUTPATIENT_CLINIC_OR_DEPARTMENT_OTHER): Payer: Medicare Other | Admitting: Oncology

## 2015-05-24 ENCOUNTER — Telehealth: Payer: Self-pay | Admitting: Oncology

## 2015-05-24 VITALS — BP 161/79 | HR 72 | Temp 98.1°F | Resp 18 | Ht 64.5 in | Wt 171.8 lb

## 2015-05-24 DIAGNOSIS — C251 Malignant neoplasm of body of pancreas: Secondary | ICD-10-CM

## 2015-05-24 DIAGNOSIS — C50911 Malignant neoplasm of unspecified site of right female breast: Secondary | ICD-10-CM

## 2015-05-24 DIAGNOSIS — J90 Pleural effusion, not elsewhere classified: Secondary | ICD-10-CM | POA: Diagnosis not present

## 2015-05-24 DIAGNOSIS — Z853 Personal history of malignant neoplasm of breast: Secondary | ICD-10-CM | POA: Diagnosis not present

## 2015-05-24 DIAGNOSIS — C250 Malignant neoplasm of head of pancreas: Secondary | ICD-10-CM

## 2015-05-24 NOTE — Telephone Encounter (Signed)
Gave avs & calendar for August thru January

## 2015-05-24 NOTE — Progress Notes (Signed)
[place]  Telephone:(336) [phone] Fax:(336) [phone]     ID: Annette Beltran OB: [birth year]  MR#: [id number]  NGE#:[id number]  PCP: [name], MD GYN:   SU: [name] OTHER MD: Delfin Edis, [name], Marica Otter (opth),   CHIEF COMPLAINT: Pancreatic cancer, status post resection and treatment, with rising CA 19  CURRENT TREATMENT: Observation   PANCREATIC CANCER HISTORY: From the earlier intake note:  I formerly followed Annette Beltran for a stage I breast cancer, which was treated with surgery, radiation, and anti-estrogens. She was released from followup here in 2007. In addition, in 2012 she had abdominal pain leading to hospitalization. 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 The patient also has a history of collagenous colitis, which of course carries its own set of symptoms, making identification of her new attic problem more difficult.  Annette Beltran tells me she started "feeling bad" in September of 2014. There was abdominal discomfort localizing to the left upper o'clock on, worse with inspiration. CT angiography 08/21/2013 showed no clot and no significant abnormalities. Plain rib and left shoulder views were negative. CT abdomen and pelvis with contrast 08/22/2013 showed slight prominence of the pancreatic duct in the distal body and tail with a new small structure emanating from the tail of the pancreas consistent with a pseudocyst. Exudate near the tail of the pancreas suggested pancreatitis. MRI of the abdomen 09/03/2013 confirmed several ill-defined fluid collections between the gastric fundus and the splenic hilum surrounding the pancreatic tail. There was no evidence of a pancreatic mass. The pancreatic tail did enhanced following contrast. The splenic  vein appeared chronically thrombosed with a small amount of nonocclusive thrombus extending into the main and left portal veins.  On 09/26/2013 amylase was 237 and lipase 513.0, consistent with acute pancreatitis. These numbers subsequently dropped in on 11/28/2013 amylase was normal at 47 and lipase was mildly elevated at 110  With continuing left-sided abdominal discomfort, abdominal ultrasound 11/13/2013 showed multiple gallstones with no evidence of cholecystitis. Evaluation of the pancreas showed a hypoechoic lobulated focus measuring up to 7.4 cm felt to be nonspecific. Repeat abdominal CT 11/14/2013 showed diffuse pancreatic atrophy without evidence of a mass, again consistent with chronic pancreatitis. There was mild soft tissue stranding suspicious for mild superimposed acute pancreatitis. A new rim-enhancing fluid collection was noted in the gastrosplenic ligament measuring 4.5 cm, consistent with a pseudocyst. Finally on 11/23/2013, endoscopic ultrasonography under Annette Beltran showed a subtle 2.5 cm soft tissue mass in the mid-pancreas, abutting the splenic vein. This was biopsied x2, with cytology (NZB [id number]) showing adenocarcinoma. A peripancreatic fluid collection measuring over 5 cm was aspirated yielding 25 cc of chocolate colored fluid. Two or three scattered round hypoechoic lymph nodes were noted near the pancreatic tail but were not sampled.  In addition, on 11/28/2013 a chest x-ray obtained to evaluate upper respiratory symptoms showed a small to moderate left effusion. Chest CT scan 11/29/2013 confirmed a moderate left-sided pleural effusion with no suspicious appearing pulmonary nodules or masses associated with it. The complex cystic lesion associated with the tail of the pancreas was again noted, measuring 5.5 cm despite the recent aspiration procedure.  The patient's subsequent history is as detailed below.  INTERVAL HISTORY: Annette Beltran returns today for followup of her pancreatic  cancer accompanied by her husband Annette Beltran and  one of her daughters. We have been following Annette Beltran is CA 19, which continues to rise in a linear fashion. I have checked with her surgeon to see if perhaps laparoscopy might provide [place] with diagnosis, but even if it did it would not provide [place] with something we could measure (aside from the CD19 itself) to assess whether chemotherapy might be effective. I also curve sided the chief GI surgeon at [facility] and he was of the same opinion: There is no survival advantage to earlier detection of recurrent metastatic disease.  REVIEW OF SYSTEMS: Annette Beltran is still entirely asymptomatic. She is much more concerned with her husband then with her own situation, although of course she is appropriately anxious about her own case. 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 She has a small rash or irritation in the upper left cheek, which she tells me she will take to dermatology. She is not exercising regularly. She tells me she has no intention 2. She says her collagenous colitis is entirely stable. Otherwise a detailed review of systems was noncontributory  PAST MEDICAL HISTORY: Past Medical History  Diagnosis Date  . Macular degeneration   . Fluid retention   . Colitis, collagenous   . Vitamin D deficiency   . Osteopenia   . Celiac artery stenosis   . Splenic infarction   . Arthritis   . History of blood clots   . Anxiety   . Shingles   . Pancreatitis   . Osteoporosis   . Clotting disorder   . Arrhythmia     "skips a beat"  [place]  heart  . GERD (gastroesophageal reflux disease)   . Breast cancer     rt lumpectomy    PAST SURGICAL HISTORY: Past Surgical History  Procedure Laterality Date  . Foot surgery  2003    x 3, 2 on right, 1 on left  . Breast lumpectomy Right     radiation for 6 weeks  . Meniscus repair Right   . Eus N/A 11/23/2013     Procedure: UPPER ENDOSCOPIC ULTRASOUND (EUS) LINEAR;  Surgeon: Milus Banister, MD;  Location: [facility];  Service: Endoscopy;  Laterality: N/A;  . Tonsillectomy    . Laparoscopy N/A 12/20/2013    Procedure: LAPAROSCOPY DIAGNOSTIC ;  Surgeon: [name], MD;  Location: [place];  Service: General;  Laterality: N/A;  . Cholecystectomy N/A 12/20/2013    Procedure: LAPAROSCOPIC CHOLECYSTECTOMY;  Surgeon: [name], MD;  Location: [place];  Service: General;  Laterality: N/A;  . Splenectomy, total N/A 12/20/2013    Procedure: SPLENECTOMY;  Surgeon: [name], MD;  Location: [place];  Service: General;  Laterality: N/A;  . Partial gastrectomy N/A 12/20/2013    Procedure: PARTIAL GASTRECTOMY;  Surgeon: [name], MD;  Location: [place];  Service: General;  Laterality: N/A;  . Portacath placement N/A 01/15/2014    Procedure: INSERTION PORT-A-CATH;  Surgeon: [name], MD;  Location: [facility];  Service: General;  Laterality: N/A;    FAMILY HISTORY Family History  Problem Relation Age of Onset  . Colon cancer Neg Hx   . Aneurysm Mother   . Stroke Mother   . Prostate cancer Father 71  . Skin cancer Brother 57    treated with interferon for 1 year  . Heart failure Paternal Aunt   . Breast cancer Paternal Aunt     dx  over 34s  . Pancreatic cancer Maternal Grandmother     dx in her 110s  . Breast cancer Maternal Aunt     dx over 2  . Rheum arthritis Brother   . Breast cancer Paternal Aunt     dx over 42s  . Leukemia Paternal Aunt    the patient's father had a diagnosis of prostate cancer metastatic to the liver or a died at age 11. The patient's mother died secondary to carotid third brain aneurysm at the age of 12. The patient had 2 brothers, no sisters. One brother has a history of melanoma. The patient's mother is mother was diagnosed with pancreatic cancer in her 85s. There is a history of breast cancer and second degree relatives, none before the age of 44  GYNECOLOGIC HISTORY:   Menarche age 70, first live birth age 25, the patient is Dovray P2.. Stopped in her early 39s. 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 Her husband of 40 years, Annette Beltran, retired from [employer] and currently works part-time as a Cabin crew with [employer]. Daughter Annette Beltran teaches first grade. Daughter Annette Beltran is a homemaker. The patient has 4 grandchildren Annette Beltran has an additional 5 grandchildren of his own). The patient attends a [employer]    ADVANCED DIRECTIVES: In place   HEALTH MAINTENANCE: History  Substance Use Topics  . Smoking status: Former Smoker -- 0.50 packs/day for 28 years    Types: Cigarettes    Quit date: 11/02/2000  . Smokeless tobacco: Never Used  . Alcohol Use: Yes     Comment: occ wine      Colonoscopy:  PAP:  Bone density: 09/21/2014, showing osteopenia with a T score of -1.7  Lipid panel:  Mammography: 09/21/2014 unremarkable  Allergies  Allergen Reactions  . Tape     Allergic  To  Tegaderm.  . Codeine Nausea And Vomiting  . Lactose Intolerance (Gi)   . Tequin     Severe stomach pain  . Vancomycin Itching    Whelps on arm of Infusing IV site  . Penicillins Nausea Only and Rash    Current Outpatient Prescriptions  Medication Sig Dispense Refill  . ALPRAZolam (XANAX) 0.5 MG tablet Take 1 tablet (0.5 mg total) by mouth at bedtime as needed for anxiety. 90 tablet 3  . antiseptic oral rinse (BIOTENE) LIQD 15 mLs by Mouth Rinse route as needed for dry mouth.    Marland Kitchen aspirin EC 81 MG tablet Take 81 mg by mouth every morning.     Marland Kitchen b complex vitamins tablet Take 1 tablet by mouth every morning.     . Biotin 1 MG CAPS Take 1 capsule by mouth daily at 12 noon.     Marland Kitchen CREON 12000 UNITS CPEP capsule TAKE TWO CAPSULES THREE TIMES DAILY WITH MEALS 270 capsule 6  . diphenhydrAMINE (BENADRYL) 25 MG tablet Take 25 mg by mouth at  bedtime.     Marland Kitchen loratadine (CLARITIN) 10 MG tablet Take 10 mg by mouth daily.    . magnesium 30 MG tablet Take 30 mg by mouth 2 (two) times daily.    Marland Kitchen omeprazole (PRILOSEC) 40 MG capsule Take 1 capsule (40 mg total) by mouth daily. (Patient not taking: Reported on 03/12/2015) 90 capsule 4  . PARoxetine (PAXIL) 20 MG tablet Take 2 tablets (40 mg total) by mouth daily. 30 tablet  5  . tobramycin (TOBREX) 0.3 % ophthalmic solution   0   No current facility-administered medications for this visit.    OBJECTIVE: Middle-aged white woman in no acute distress Filed Vitals:   05/24/15 1006  BP: 161/79  Pulse: 72  Temp: 98.1 F (36.7 C)  Resp: 18     Body mass index is 29.04 kg/(m^2).    ECOG FS:0 - Asymptomatic  Sclerae unicteric, pupils round and equal Oropharynx clear and moist-- no thrush or other lesions No cervical or supraclavicular adenopathy Lungs no rales or rhonchi Heart regular rate and rhythm Abd soft, nontender, positive bowel sounds, no masses palpated  MSK no focal spinal tenderness, no upper extremity lymphedema Neuro: nonfocal, well oriented, appropriate affect Breasts: deferred    LAB RESULTS: Results for JISSELLE, POTH (MRN [id number]) as of 05/24/2015 14:07  Ref. Range 08/15/2014 09:01 11/08/2014 11:03 12/25/2014 14:54 01/30/2015 14:02 03/15/2015 14:05 05/13/2015 10:18  CA 19-9 Latest Ref Range: <35.0 U/mL 26.1 93.1 (H) 239.6 (H) 546.9 (H) 906.3 (H) 2259.1 (H)      Component Value Date/Time   NA 141 05/13/2015 1018   NA 139 06/01/2014 1155   K 4.7 05/13/2015 1018   K 4.6 06/01/2014 1155   CL 100 06/01/2014 1155   CO2 28 05/13/2015 1018   CO2 27 06/01/2014 1155   GLUCOSE 140 05/13/2015 1018   GLUCOSE 101* 06/01/2014 1155   BUN 14.3 05/13/2015 1018   BUN 13 06/01/2014 1155   CREATININE 0.8 05/13/2015 1018   CREATININE 0.59 06/01/2014 1155   CREATININE 0.62 11/28/2013 1635   CALCIUM 9.3 05/13/2015 1018   CALCIUM 9.5 06/01/2014 1155   PROT 6.7 05/13/2015 1018    PROT 5.0* 12/21/2013 0450   ALBUMIN 3.5 05/13/2015 1018   ALBUMIN 2.3* 12/21/2013 0450   AST 28 05/13/2015 1018   AST 42* 12/21/2013 0450   ALT 24 05/13/2015 1018   ALT 25 12/21/2013 0450   ALKPHOS 73 05/13/2015 1018   ALKPHOS 46 12/21/2013 0450   BILITOT 0.99 05/13/2015 1018   BILITOT 0.7 12/21/2013 0450   GFRNONAA >90 06/01/2014 1155   GFRAA >90 06/01/2014 1155    I No results found for: SPEP  Lab Results  Component Value Date   WBC 8.8 05/13/2015   NEUTROABS 6.1 05/13/2015   HGB 14.6 05/13/2015   HCT 41.7 05/13/2015   MCV 91.9 05/13/2015   PLT 341 05/13/2015      Chemistry      Component Value Date/Time   NA 141 05/13/2015 1018   NA 139 06/01/2014 1155   K 4.7 05/13/2015 1018   K 4.6 06/01/2014 1155   CL 100 06/01/2014 1155   CO2 28 05/13/2015 1018   CO2 27 06/01/2014 1155   BUN 14.3 05/13/2015 1018   BUN 13 06/01/2014 1155   CREATININE 0.8 05/13/2015 1018   CREATININE 0.59 06/01/2014 1155   CREATININE 0.62 11/28/2013 1635      Component Value Date/Time   CALCIUM 9.3 05/13/2015 1018   CALCIUM 9.5 06/01/2014 1155   ALKPHOS 73 05/13/2015 1018   ALKPHOS 46 12/21/2013 0450   AST 28 05/13/2015 1018   AST 42* 12/21/2013 0450   ALT 24 05/13/2015 1018   ALT 25 12/21/2013 0450   BILITOT 0.99 05/13/2015 1018   BILITOT 0.7 12/21/2013 0450     Lab Results  Component Value Date   LABCA2 8 06/08/2011    No components found for: [id number]  No results for input(s): INR in the last 168  hours.  Urinalysis    Component Value Date/Time   COLORURINE YELLOW 06/07/2011 1942   APPEARANCEUR CLEAR 06/07/2011 1942   LABSPEC 1.010 02/15/2014 1109   LABSPEC 1.009 06/07/2011 1942   PHURINE 6.5 02/15/2014 1109   PHURINE 6.0 06/07/2011 1942   GLUCOSEU Negative 02/15/2014 1109   GLUCOSEU NEGATIVE 06/07/2011 1942   HGBUR Negative 02/15/2014 1109   HGBUR NEGATIVE 06/07/2011 1942   BILIRUBINUR Negative 02/15/2014 1109   BILIRUBINUR neg 12/25/2011 1216   BILIRUBINUR  NEGATIVE 06/07/2011 1942   KETONESUR Negative 02/15/2014 1109   KETONESUR NEGATIVE 06/07/2011 1942   PROTEINUR Negative 02/15/2014 1109   PROTEINUR neg 12/25/2011 1216   PROTEINUR NEGATIVE 06/07/2011 1942   UROBILINOGEN 0.2 02/15/2014 1109   UROBILINOGEN negative 12/25/2011 1216   UROBILINOGEN 0.2 06/07/2011 1942   NITRITE Negative 02/15/2014 1109   NITRITE neg 12/25/2011 1216   NITRITE NEGATIVE 06/07/2011 1942   LEUKOCYTESUR Negative 02/15/2014 1109   LEUKOCYTESUR Negative 12/25/2011 1216    STUDIES: Nm Pet Image Restag (ps) Skull Base To Thigh  05/13/2015   CLINICAL DATA:  Subsequent treatment strategy for pancreatic cancer diagnosed January 2015 status post distal pancreatectomy, splenectomy and partial gastrectomy. Chemotherapy and radiation therapy completed 4 months ago. Remote breast cancer.  EXAM: NUCLEAR MEDICINE PET SKULL BASE TO THIGH  TECHNIQUE: 8.5 mCi F-18 FDG was injected intravenously. Full-ring PET imaging was performed from the skull base to thigh after the radiotracer. CT data was obtained and used for attenuation correction and anatomic localization.  FASTING BLOOD GLUCOSE:  Value: 139 mg/dl  COMPARISON:  PET-CT scan 11/20/2014, CT 08/13/2014, 11/14/2013  FINDINGS: NECK  No hypermetabolic lymph nodes in the neck.  CHEST  No hypermetabolic mediastinal or hilar nodes. No suspicious pulmonary nodules on the CT scan.  ABDOMEN/PELVIS  Patient status post Whipple procedure with splenectomy and partial gastrectomy.  There is no clear abnormal metabolic activity associated with the pancreatic head. There is mild activity adjacent to surgical clips in the retroperitoneum left adjacent to the SMA on image 113. There is mild thickening (12 mm) at this site with mild metabolic activity SUV max 3. This finding is similar to comparison PET-CT scan.  No foci of metabolic activity within the stomach in the \astric body just superior to the surgical clips (image 101) with SUV max 3.2. There is  no clear associated lesion CT.  No abnormal metabolic activity in the liver.  No hypermetabolic peritoneal thickening for metastatic disease. Small lymph node adjacent to pancreatic head not have significant metabolic activity  SKELETON  All caps all are are  IMPRESSION: 1. No clear evidence ancreatic carcinoma recurrence. 2. Retroperitoneal thickening left adjacent to the proximal SMA with mild metabolic activity is similar to comparison exam. This may represent postsurgical inflammation. Recommend attention on follow-up. 3. Focus of metabolic activity within gastric body without corresponding CT findings. This indeterminate. 4. No evidence of liver metastasis or peritoneal metastasis.   Electronically Signed   By: Suzy Bouchard M.D.   On: 05/13/2015 12:40     ASSESSMENT: 69 y.o. 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 noted on abdominal MRI November 2014  PANCREATIC CANCER: (3) status post endoscopic ultrasonography of the pancreas 11/23/2013 with cytology positive for adenocarcinoma, clinical stage T2 NX MX adenocarcinoma associated with a 5.5 cm pseudocyst (which was aspirated); baseline CA 19-9 was 47.0  (4) left pleural effusion status post thoracentesis 12/01/2013, cytology negative  (5) s/p laparoscopic cholecystectomy, open extended distal  pancreatectomy, splenectomy, partial gastrectomy x2 12/20/2013 for a pT1 pN0, stage IA invasive ductal adenocarcinoma, grade 2, with positive resection margins  (a) "triple vaccination"(meningococcus, pneumococcus, Haemophilus) given 08/15/2014  (6) adjuvant gemcitabine started 02/01/2014-- received 3 weekly doses before radiation, will reeive 9 additional doses starting 3-5 weeks after completion of radiation  (7) adjuvant radiation started 02/27/2014, with radiosensitization (capecitabine 1000 mg po BID on radiation days); completed 04/06/2014  (8) adjuvant gemcitabine weekly resumed 05/07/2014,  completed 9 doses 07/10/2014  (9) rise in CA 19 noted 11/08/2014: CT scan of the abdomen and pelvis obtained the same day showed no evidence of disease recurrence; PET scan 11/20/2014 and MRI of the abdomen 12/30/2014 and 03/16/2015, and repeat PET scan 05/03/2015 show no measurable disease despite continuing CA 19 rise  PLAN: Dajana remains completely asymptomatic, with an entirely normal functional status. She, her family, and I continue to be distressed at the rise in her CA 11, which certainly indicates is some pancreatic cancer somewhere and that it is at least making more of this marker. However it remains not measurable.  After checking with her surgeon and getting a second surgical opinion by "curbside", we are making no changes in the overall plan, which is 2 repeat an MRI of the abdomen in 2 months or earlier if she has any symptoms.  Oniyah has a good understanding of their overall plan. She agrees with it. She will call with any problems that may develop before her next visit here.  Chauncey Cruel, MD   05/24/2015 2:01 PM

## 2015-07-03 ENCOUNTER — Other Ambulatory Visit: Payer: Self-pay | Admitting: Oncology

## 2015-07-03 ENCOUNTER — Ambulatory Visit (HOSPITAL_BASED_OUTPATIENT_CLINIC_OR_DEPARTMENT_OTHER): Payer: Medicare Other

## 2015-07-03 ENCOUNTER — Encounter: Payer: Self-pay | Admitting: Nurse Practitioner

## 2015-07-03 ENCOUNTER — Ambulatory Visit (HOSPITAL_BASED_OUTPATIENT_CLINIC_OR_DEPARTMENT_OTHER): Payer: Medicare Other | Admitting: Nurse Practitioner

## 2015-07-03 ENCOUNTER — Telehealth: Payer: Self-pay | Admitting: *Deleted

## 2015-07-03 ENCOUNTER — Other Ambulatory Visit: Payer: Self-pay | Admitting: Nurse Practitioner

## 2015-07-03 VITALS — BP 135/69 | HR 91 | Resp 17 | Ht 64.5 in | Wt 169.3 lb

## 2015-07-03 DIAGNOSIS — E860 Dehydration: Secondary | ICD-10-CM

## 2015-07-03 DIAGNOSIS — R197 Diarrhea, unspecified: Secondary | ICD-10-CM | POA: Diagnosis not present

## 2015-07-03 DIAGNOSIS — C251 Malignant neoplasm of body of pancreas: Secondary | ICD-10-CM

## 2015-07-03 DIAGNOSIS — Z853 Personal history of malignant neoplasm of breast: Secondary | ICD-10-CM

## 2015-07-03 DIAGNOSIS — R110 Nausea: Secondary | ICD-10-CM | POA: Diagnosis not present

## 2015-07-03 DIAGNOSIS — C250 Malignant neoplasm of head of pancreas: Secondary | ICD-10-CM

## 2015-07-03 DIAGNOSIS — C50911 Malignant neoplasm of unspecified site of right female breast: Secondary | ICD-10-CM

## 2015-07-03 LAB — COMPREHENSIVE METABOLIC PANEL (CC13)
ALBUMIN: 3.3 g/dL — AB (ref 3.5–5.0)
ALT: 15 U/L (ref 0–55)
AST: 18 U/L (ref 5–34)
Alkaline Phosphatase: 86 U/L (ref 40–150)
Anion Gap: 10 mEq/L (ref 3–11)
BILIRUBIN TOTAL: 0.9 mg/dL (ref 0.20–1.20)
BUN: 14.2 mg/dL (ref 7.0–26.0)
CALCIUM: 9.1 mg/dL (ref 8.4–10.4)
CHLORIDE: 102 meq/L (ref 98–109)
CO2: 24 mEq/L (ref 22–29)
CREATININE: 0.8 mg/dL (ref 0.6–1.1)
EGFR: 75 mL/min/{1.73_m2} — ABNORMAL LOW (ref >90)
Glucose: 252 mg/dl — ABNORMAL HIGH (ref 70–140)
Potassium: 3.9 mEq/L (ref 3.5–5.1)
Sodium: 136 mEq/L (ref 136–145)
TOTAL PROTEIN: 6.9 g/dL (ref 6.4–8.3)

## 2015-07-03 LAB — CBC WITH DIFFERENTIAL/PLATELET
BASO%: 0.8 % (ref 0.0–2.0)
Basophils Absolute: 0.1 10*3/uL (ref 0.0–0.1)
EOS%: 1 % (ref 0.0–7.0)
Eosinophils Absolute: 0.1 10*3/uL (ref 0.0–0.5)
HEMATOCRIT: 43.7 % (ref 34.8–46.6)
HEMOGLOBIN: 15.3 g/dL (ref 11.6–15.9)
LYMPH#: 1 10*3/uL (ref 0.9–3.3)
LYMPH%: 12.4 % — ABNORMAL LOW (ref 14.0–49.7)
MCH: 32.4 pg (ref 25.1–34.0)
MCHC: 35.1 g/dL (ref 31.5–36.0)
MCV: 92.6 fL (ref 79.5–101.0)
MONO#: 1.7 10*3/uL — ABNORMAL HIGH (ref 0.1–0.9)
MONO%: 20.6 % — ABNORMAL HIGH (ref 0.0–14.0)
NEUT#: 5.2 10*3/uL (ref 1.5–6.5)
NEUT%: 65.2 % (ref 38.4–76.8)
Platelets: 375 10*3/uL (ref 145–400)
RBC: 4.72 10*6/uL (ref 3.70–5.45)
RDW: 12 % (ref 11.2–14.5)
WBC: 8 10*3/uL (ref 3.9–10.3)

## 2015-07-03 LAB — MAGNESIUM (CC13): MAGNESIUM: 1.8 mg/dL (ref 1.5–2.5)

## 2015-07-03 MED ORDER — ONDANSETRON HCL 8 MG PO TABS: 8.0000 mg | ORAL_TABLET | Freq: Three times a day (TID) | ORAL | Status: AC | PRN

## 2015-07-03 MED ORDER — ONDANSETRON HCL 4 MG/2ML IJ SOLN: 8.0000 mg | INTRAMUSCULAR | Status: DC

## 2015-07-03 MED ORDER — SODIUM CHLORIDE 0.9 % IV SOLN
1000.0000 mL | INTRAVENOUS | Status: AC
  Administered 2015-07-03: 1000 mL via INTRAVENOUS

## 2015-07-03 MED ORDER — SODIUM CHLORIDE 0.9 % IV SOLN
Freq: Once | INTRAVENOUS | Status: AC
  Administered 2015-07-03: 13:00:00 via INTRAVENOUS
  Filled 2015-07-03: qty 4

## 2015-07-03 NOTE — Patient Instructions (Addendum)
Dehydration, Adult Dehydration means your body does not have as much fluid as it needs. Your kidneys, brain, and heart will not work properly without the right amount of fluids and salt.  HOME CARE  Ask your doctor how to replace body fluid losses (rehydrate).  Drink enough fluids to keep your pee (urine) clear or pale yellow.  Drink small amounts of fluids often if you feel sick to your stomach (nauseous) or throw up (vomit).  Eat like you normally do.  Avoid:  Foods or drinks high in sugar.  Bubbly (carbonated) drinks.  Juice.  Very hot or cold fluids.  Drinks with caffeine.  Fatty, greasy foods.  Alcohol.  Tobacco.  Eating too much.  Gelatin desserts.  Wash your hands to avoid spreading germs (bacteria, viruses).  Only take medicine as told by your doctor.  Keep all doctor visits as told. GET HELP RIGHT AWAY IF:   You cannot drink something without throwing up.  You get worse even with treatment.  Your vomit has blood in it or looks greenish.  Your poop (stool) has blood in it or looks black and tarry.  You have not peed in 6 to 8 hours.  You pee a small amount of very dark pee.  You have a fever.  You pass out (faint).  You have belly (abdominal) pain that gets worse or stays in one spot (localizes).  You have a rash, stiff neck, or bad headache.  You get easily annoyed, sleepy, or are hard to wake up.  You feel weak, dizzy, or very thirsty. MAKE SURE YOU:   Understand these instructions.  Will watch your condition.  Will get help right away if you are not doing well or get worse. Document Released: 08/15/2009 Document Revised: 01/11/2012 Document Reviewed: 06/08/2011 [facility] Patient Information 2015 [place], [place]. This information is not intended to replace advice given to you by your health care provider. Make sure you discuss any questions you have with your health care provider.  Diarrhea Diarrhea is watery poop (stool). It can  make you feel weak, tired, thirsty, or give you a dry mouth (signs of dehydration). Watery poop is a sign of another problem, most often an infection. It often lasts 2-3 days. It can last longer if it is a sign of something serious. Take care of yourself as told by your doctor. HOME CARE   Drink 1 cup (8 ounces) of fluid each time you have watery poop.  Do not drink the following fluids:  Those that contain simple sugars (fructose, glucose, galactose, lactose, sucrose, maltose).  Sports drinks.  Fruit juices.  Whole milk products.  Sodas.  Drinks with caffeine (coffee, tea, soda) or alcohol.  Oral rehydration solution may be used if the doctor says it is okay. You may make your own solution. Follow this recipe:   - teaspoon table salt.   teaspoon baking soda.   teaspoon salt substitute containing potassium chloride.  1 tablespoons sugar.  1 liter (34 ounces) of water.  Avoid the following foods:  High fiber foods, such as raw fruits and vegetables.  Nuts, seeds, and whole grain breads and cereals.   Those that are sweetened with sugar alcohols (xylitol, sorbitol, mannitol).  Try eating the following foods:  Starchy foods, such as rice, toast, pasta, low-sugar cereal, oatmeal, baked potatoes, crackers, and bagels.  Bananas.  Applesauce.  Eat probiotic-rich foods, such as yogurt and milk products that are fermented.  Wash your hands well after each time you have watery  poop.  Only take medicine as told by your doctor.  Take a warm bath to help lessen burning or pain from having watery poop. GET HELP RIGHT AWAY IF:   You cannot drink fluids without throwing up (vomiting).  You keep throwing up.  You have blood in your poop, or your poop looks black and tarry.  You do not pee (urinate) in 6-8 hours, or there is only a small amount of very dark pee.  You have belly (abdominal) pain that gets worse or stays in the same spot (localizes).  You are weak,  dizzy, confused, or light-headed.  You have a very bad headache.  Your watery poop gets worse or does not get better.  You have a fever or lasting symptoms for more than 2-3 days.  You have a fever and your symptoms suddenly get worse. MAKE SURE YOU:   Understand these instructions.  Will watch your condition.  Will get help right away if you are not doing well or get worse. Document Released: 04/06/2008 Document Revised: 03/05/2014 Document Reviewed: 06/26/2012 [facility] Patient Information 2015 [place], [place]. This information is not intended to replace advice given to you by your health care provider. Make sure you discuss any questions you have with your health care provider.

## 2015-07-03 NOTE — Assessment & Plan Note (Signed)
Patient is complaining of two-day history of nausea and dry heaving; but no vomiting.  She has Compazine at home to take; but it has been minimally effective.  Patient received IV fluid rehydration while the cancer Center today.  She was also given Zofran IV.  She was prescribed Zofran to try at home as well.  Patient's recent onset of GI symptoms could very well be a viral infection.  Patient states that she has no other family members with the same symptoms.  Awaiting tumor marker results.  Will reschedule restaging scans to early next week.

## 2015-07-03 NOTE — Assessment & Plan Note (Signed)
Patient reports nausea and dry heaves; as well as multiple bouts of diarrhea within the past 2 days.  She feels somewhat dehydrated today.  She states she has not tried any Imodium over-the-counter as of yet.  She confirmed that she has not started taking any magnesium supplements.  Patient received IV fluid rehydration while cancer Center today.  She also received Zofran IV as well.  She was encouraged to push fluids at home.  Patient will also be scheduled to return to the [facility] tomorrow for additional IV fluid rehydration.  Patient states that she will call and cancel her hydration appointment if she feels she is taking in enough fluids at home tomorrow.

## 2015-07-03 NOTE — Assessment & Plan Note (Signed)
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  Will move up the planned restaging MRI of the chest/abdomen/pelvis for next week.

## 2015-07-03 NOTE — Assessment & Plan Note (Signed)
Patient reports frequent episodes of diarrhea for the past 2 days.  She has taken no Imodium over-the-counter.  She reports she has not started taking the magnesium supplements.  Patient was given IV fluid rehydration while the cancer Center today.  She was also encouraged to try over-the-counter Imodium for diarrhea.  If the Imodium is not effective.-Will consider prescribing Lomotil and/or Questran.  Also, have requested that patient give [place] a stool sample to rule out C. difficile.

## 2015-07-03 NOTE — Assessment & Plan Note (Signed)
PANCREATIC CANCER: (3) status post endoscopic ultrasonography of the pancreas 11/23/2013 with cytology positive for adenocarcinoma, clinical stage T2 NX MX adenocarcinoma associated with a 5.5 cm pseudocyst (which was aspirated); baseline CA 19-9 was 47.0  (4) left pleural effusion status post thoracentesis 12/01/2013, cytology negative  (5) s/p laparoscopic cholecystectomy, open extended distal pancreatectomy, splenectomy, partial gastrectomy x2 12/20/2013 for a pT1 pN0, stage IA invasive ductal adenocarcinoma, grade 2, with positive resection margins (a) "triple vaccination"(meningococcus, pneumococcus, Haemophilus) given 08/15/2014  (6) adjuvant gemcitabine started 02/01/2014-- received 3 weekly doses before radiation, will reeive 9 additional doses starting 3-5 weeks after completion of radiation  (7) adjuvant radiation started 02/27/2014, with radiosensitization (capecitabine 1000 mg po BID on radiation days); completed 04/06/2014  (8) adjuvant gemcitabine weekly resumed 05/07/2014, completed 9 doses 07/10/2014  (9) rise in CA 19 noted 11/08/2014: CT scan of the abdomen and pelvis obtained the same day showed no evidence of disease recurrence; PET scan 11/20/2014 and MRI of the abdomen 12/30/2014 and 03/16/2015, and repeat PET scan 05/03/2015 show no measurable disease despite continuing CA 19 rise  Patient reports 2 day history of nausea, dry heaving, and diarrhea.  She does complain of some cramping to her lower abdominal area.  Labs obtained today were essentially normal; with the exception of her blood sugar.  CA 19-9 tumor marker pending results.  Will reschedule patient's restaging MRI of the chest/abdomen/pelvis to be obtained early next week.

## 2015-07-03 NOTE — Progress Notes (Signed)
SYMPTOM MANAGEMENT CLINIC   HPI: Annette Beltran 69 y.o. female diagnosed with both breast cancer and pancreatic cancer.  Patient is status post surgeries, chemotherapy, and radiation therapy in the past.  Currently undergoing observation only.  Patient reports a 2 day history of nausea, dry heaving, and diarrhea.  She denies any vomiting.  She states that she has not tried any of the over-the-counter Imodium.  She reports that she has not initiated the magnesium supplementation as of yet.  She feels slightly dehydrated today.  She denies any recent fevers or chills.  HPI  ROS  Past Medical History  Diagnosis Date  . Macular degeneration   . Fluid retention   . Colitis, collagenous   . Vitamin D deficiency   . Osteopenia   . Celiac artery stenosis   . Splenic infarction   . Arthritis   . History of blood clots   . Anxiety   . Shingles   . Pancreatitis   . Osteoporosis   . Clotting disorder   . Arrhythmia     "skips a beat"  [place]  heart  . GERD (gastroesophageal reflux disease)   . Breast cancer     rt lumpectomy    Past Surgical History  Procedure Laterality Date  . Foot surgery  2003    x 3, 2 on right, 1 on left  . Breast lumpectomy Right     radiation for 6 weeks  . Meniscus repair Right   . Eus N/A 11/23/2013    Procedure: UPPER ENDOSCOPIC ULTRASOUND (EUS) LINEAR;  Surgeon: Milus Banister, MD;  Location: [facility];  Service: Endoscopy;  Laterality: N/A;  . Tonsillectomy    . Laparoscopy N/A 12/20/2013    Procedure: LAPAROSCOPY DIAGNOSTIC ;  Surgeon: [name], MD;  Location: [facility];  Service: General;  Laterality: N/A;  . Cholecystectomy N/A 12/20/2013    Procedure: LAPAROSCOPIC CHOLECYSTECTOMY;  Surgeon: [name], MD;  Location: [facility];  Service: General;  Laterality: N/A;  . Splenectomy, total N/A 12/20/2013    Procedure: SPLENECTOMY;  Surgeon: [name], MD;  Location: [facility];  Service: General;  Laterality: N/A;  . Partial gastrectomy N/A  12/20/2013    Procedure: PARTIAL GASTRECTOMY;  Surgeon: [name], MD;  Location: [facility];  Service: General;  Laterality: N/A;  . Portacath placement N/A 01/15/2014    Procedure: INSERTION PORT-A-CATH;  Surgeon: [name], MD;  Location: [facility];  Service: General;  Laterality: N/A;    has RHINOSINUSITIS, CHRONIC; ALLERGIC RHINITIS; Collagenous colitis; Anxiety; Splenic infarct; Osteopenia; Grief reaction; Recurrent serous otitis media; Pancreatitis, acute; Malignant neoplasm of body of pancreas; Breast cancer, right breast; Atherosclerosis of aorta; Peripheral edema; Malnutrition of moderate degree; Nausea without vomiting; Diarrhea; and Dehydration on her problem list.    is allergic to tape; codeine; lactose intolerance (gi); tequin; vancomycin; and penicillins.    Medication List       This list is accurate as of: 07/03/15  3:47 PM.  Always use your most recent med list.               ALPRAZolam 0.5 MG tablet  Commonly known as:  XANAX  Take 1 tablet (0.5 mg total) by mouth at bedtime as needed for anxiety.     antiseptic oral rinse Liqd  15 mLs by Mouth Rinse route as needed for dry mouth.     aspirin EC 81 MG tablet  Take 81 mg by mouth every morning.     b complex  vitamins tablet  Take 1 tablet by mouth every morning.     Biotin 1 MG Caps  Take 1 capsule by mouth daily at 12 noon.     CREON 12000 UNITS Cpep capsule  Generic drug:  lipase/protease/amylase  TAKE TWO CAPSULES THREE TIMES DAILY WITH MEALS     diphenhydrAMINE 25 MG tablet  Commonly known as:  BENADRYL  Take 25 mg by mouth at bedtime.     loratadine 10 MG tablet  Commonly known as:  CLARITIN  Take 10 mg by mouth daily.     magnesium 30 MG tablet  Take 30 mg by mouth 2 (two) times daily.     omeprazole 40 MG capsule  Commonly known as:  PRILOSEC  Take 1 capsule (40 mg total) by mouth daily.     ondansetron 8 MG tablet  Commonly known as:  ZOFRAN  Take 1 tablet (8 mg total) by mouth every 8  (eight) hours as needed for nausea or vomiting.     PARoxetine 20 MG tablet  Commonly known as:  PAXIL  Take 2 tablets (40 mg total) by mouth daily.     tobramycin 0.3 % ophthalmic solution  Commonly known as:  TOBREX         PHYSICAL EXAMINATION  Oncology Vitals 07/03/2015 05/24/2015 03/22/2015 03/12/2015 01/15/2015 01/04/2015 12/30/2014  Height 164 cm 164 cm 164 cm - - 164 cm -  Weight 76.794 kg 77.928 kg 77.338 kg 77.111 kg 76.204 kg 76.839 kg 72.576 kg  Weight (lbs) 169 lbs 5 oz 171 lbs 13 oz 170 lbs 8 oz 170 lbs 168 lbs 169 lbs 6 oz 160 lbs  BMI (kg/m2) 28.61 kg/m2 29.03 kg/m2 28.81 kg/m2 - - 28.63 kg/m2 -  Temp (No Data) 98.1 97.8 98.2 98.7 98.4 -  Pulse 91 72 46 60 73 57 -  Resp 17 18 18  - - 18 -  SpO2 96 98 99 96 96 - -  BSA (m2) 1.87 m2 1.88 m2 1.88 m2 - - 1.87 m2 -   BP Readings from Last 3 Encounters:  07/03/15 135/69  05/24/15 161/79  03/22/15 150/52    Physical Exam  Constitutional: She is oriented to person, place, and time and well-developed, well-nourished, and in no distress.  HENT:  Head: Normocephalic and atraumatic.  Mouth/Throat: Oropharynx is clear and moist.  Eyes: Conjunctivae and EOM are normal. Pupils are equal, round, and reactive to light. Right eye exhibits no discharge. Left eye exhibits no discharge. No scleral icterus.  Neck: Normal range of motion. Neck supple. No JVD present. No tracheal deviation present. No thyromegaly present.  Cardiovascular: Normal rate, regular rhythm, normal heart sounds and intact distal pulses.   Pulmonary/Chest: Effort normal and breath sounds normal. No respiratory distress. She has no wheezes. She has no rales. She exhibits no tenderness.  Abdominal: Soft. Bowel sounds are normal. She exhibits no distension and no mass. There is no tenderness. There is no rebound and no guarding.  Musculoskeletal: Normal range of motion. She exhibits no edema or tenderness.  Lymphadenopathy:    She has no cervical adenopathy.    Neurological: She is alert and oriented to person, place, and time. Gait normal.  Skin: Skin is warm and dry. No rash noted. No erythema. There is pallor.  Psychiatric: Affect normal.  Nursing note and vitals reviewed.   LABORATORY DATA:. Appointment on 07/03/2015  Component Date Value Ref Range Status  . WBC 07/03/2015 8.0  3.9 - 10.3 10e3/uL Final  . NEUT#  07/03/2015 5.2  1.5 - 6.5 10e3/uL Final  . HGB 07/03/2015 15.3  11.6 - 15.9 g/dL Final  . HCT 07/03/2015 43.7  34.8 - 46.6 % Final  . Platelets 07/03/2015 375  145 - 400 10e3/uL Final  . MCV 07/03/2015 92.6  79.5 - 101.0 fL Final  . MCH 07/03/2015 32.4  25.1 - 34.0 pg Final  . MCHC 07/03/2015 35.1  31.5 - 36.0 g/dL Final  . RBC 07/03/2015 4.72  3.70 - 5.45 10e6/uL Final  . RDW 07/03/2015 12.0  11.2 - 14.5 % Final  . lymph# 07/03/2015 1.0  0.9 - 3.3 10e3/uL Final  . MONO# 07/03/2015 1.7* 0.1 - 0.9 10e3/uL Final  . Eosinophils Absolute 07/03/2015 0.1  0.0 - 0.5 10e3/uL Final  . Basophils Absolute 07/03/2015 0.1  0.0 - 0.1 10e3/uL Final  . NEUT% 07/03/2015 65.2  38.4 - 76.8 % Final  . LYMPH% 07/03/2015 12.4* 14.0 - 49.7 % Final  . MONO% 07/03/2015 20.6* 0.0 - 14.0 % Final  . EOS% 07/03/2015 1.0  0.0 - 7.0 % Final  . BASO% 07/03/2015 0.8  0.0 - 2.0 % Final  . Sodium 07/03/2015 136  136 - 145 mEq/L Final  . Potassium 07/03/2015 3.9  3.5 - 5.1 mEq/L Final  . Chloride 07/03/2015 102  98 - 109 mEq/L Final  . CO2 07/03/2015 24  22 - 29 mEq/L Final  . Glucose 07/03/2015 252* 70 - 140 mg/dl Final  . BUN 07/03/2015 14.2  7.0 - 26.0 mg/dL Final  . Creatinine 07/03/2015 0.8  0.6 - 1.1 mg/dL Final  . Total Bilirubin 07/03/2015 0.90  0.20 - 1.20 mg/dL Final  . Alkaline Phosphatase 07/03/2015 86  40 - 150 U/L Final  . AST 07/03/2015 18  5 - 34 U/L Final  . ALT 07/03/2015 15  0 - 55 U/L Final  . Total Protein 07/03/2015 6.9  6.4 - 8.3 g/dL Final  . Albumin 07/03/2015 3.3* 3.5 - 5.0 g/dL Final  . Calcium 07/03/2015 9.1  8.4 - 10.4 mg/dL  Final  . Anion Gap 07/03/2015 10  3 - 11 mEq/L Final  . EGFR 07/03/2015 75* >90 ml/min/1.73 m2 Final   eGFR is calculated using the CKD-EPI Creatinine Equation (2009)  . Magnesium 07/03/2015 1.8  1.5 - 2.5 mg/dl Final     RADIOGRAPHIC STUDIES: No results found.  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  Will move up the planned restaging MRI of the chest/abdomen/pelvis for next week.  Dehydration Patient reports nausea and dry heaves; as well as multiple bouts of diarrhea within the past 2 days.  She feels somewhat dehydrated today.  She states she has not tried any Imodium over-the-counter as of yet.  She confirmed that she has not started taking any magnesium supplements.  Patient received IV fluid  rehydration while cancer Center today.  She also received Zofran IV as well.  She was encouraged to push fluids at home.  Patient will also be scheduled to return to the [facility] tomorrow for additional IV fluid rehydration.  Patient states that she will call and cancel her hydration appointment if she feels she is taking in enough fluids at home tomorrow.  Diarrhea Patient reports frequent episodes of diarrhea for the  past 2 days.  She has taken no Imodium over-the-counter.  She reports she has not started taking the magnesium supplements.  Patient was given IV fluid rehydration while the cancer Center today.  She was also encouraged to try over-the-counter Imodium for diarrhea.  If the Imodium is not effective.-Will consider prescribing Lomotil and/or Questran.  Also, have requested that patient give [place] a stool sample to rule out C. difficile.  Malignant neoplasm of body of pancreas PANCREATIC CANCER: (3) status post endoscopic ultrasonography of the pancreas 11/23/2013 with cytology positive for adenocarcinoma, clinical stage T2 NX MX adenocarcinoma associated with a 5.5 cm pseudocyst (which was aspirated); baseline CA 19-9 was 47.0  (4) left pleural effusion status post thoracentesis 12/01/2013, cytology negative  (5) s/p laparoscopic cholecystectomy, open extended distal pancreatectomy, splenectomy, partial gastrectomy x2 12/20/2013 for a pT1 pN0, stage IA invasive ductal adenocarcinoma, grade 2, with positive resection margins (a) "triple vaccination"(meningococcus, pneumococcus, Haemophilus) given 08/15/2014  (6) adjuvant gemcitabine started 02/01/2014-- received 3 weekly doses before radiation, will reeive 9 additional doses starting 3-5 weeks after completion of radiation  (7) adjuvant radiation started 02/27/2014, with radiosensitization (capecitabine 1000 mg po BID on radiation days); completed 04/06/2014  (8) adjuvant gemcitabine weekly resumed 05/07/2014, completed 9 doses 07/10/2014  (9) rise in CA 19 noted 11/08/2014: CT scan of the abdomen and pelvis obtained the same day showed no evidence of disease recurrence; PET scan 11/20/2014 and MRI of the abdomen 12/30/2014 and 03/16/2015, and repeat PET scan 05/03/2015 show no measurable disease despite continuing CA 19 rise  Patient reports 2 day history of nausea, dry heaving, and diarrhea.  She does complain of some cramping to her  lower abdominal area.  Labs obtained today were essentially normal; with the exception of her blood sugar.  CA 19-9 tumor marker pending results.  Will reschedule patient's restaging MRI of the chest/abdomen/pelvis to be obtained early next week.   Nausea without vomiting Patient is complaining of two-day history of nausea and dry heaving; but no vomiting.  She has Compazine at home to take; but it has been minimally effective.  Patient received IV fluid rehydration while the cancer Center today.  She was also given Zofran IV.  She was prescribed Zofran to try at home as well.  Patient's recent onset of GI symptoms could very well be a viral infection.  Patient states that she has no other family members with the same symptoms.  Awaiting tumor marker results.  Will reschedule restaging scans to early next week.  Patient stated understanding of all instructions; and was in agreement with this plan of care. The patient knows to call the clinic with any problems, questions or concerns.   Review/collaboration with Dr. Jana Hakim regarding all aspects of patient's visit today.   Total time spent with patient was 25 minutes;  with greater than 75 percent of that time spent in face to face counseling regarding patient's symptoms,  and coordination of care and follow up.  Disclaimer:This dictation was prepared with Dragon/digital dictation along with [employer]. Any transcriptional errors that result  from this process are unintentional.  Drue Second, NP 07/03/2015

## 2015-07-03 NOTE — Telephone Encounter (Signed)
Voicemail from spouse "requesting return call from a nurse".  Patient reports "history of pancreatic cancer and pancreatitis or I may have a virus.  Experiencing diarrhea and dry heaves since Monday morning.  To much to count.  Overnight I went about seven times.  Abdomen is slightly swollen, no fever, not eating but trying to drink sips of water, orange juice, coffee to stay hydrated but I know I ned to drink more.  I've taken compazine I had on hand from chemotherapy.  My husband has the same feelings but his dry heaves go away in the morning."  Instructed to come in for lab and [facility].  Has to get dressed but can arrive at 11:15 for lab.  Return number [phone].

## 2015-07-04 ENCOUNTER — Other Ambulatory Visit (HOSPITAL_COMMUNITY)
Admission: RE | Admit: 2015-07-04 | Discharge: 2015-07-04 | Disposition: A | Discharge status: Home / Self Care | Payer: Medicare Other | Source: Ambulatory Visit | Attending: Anesthesiology | Admitting: Anesthesiology

## 2015-07-04 ENCOUNTER — Ambulatory Visit (HOSPITAL_BASED_OUTPATIENT_CLINIC_OR_DEPARTMENT_OTHER): Payer: Medicare Other

## 2015-07-04 ENCOUNTER — Inpatient Hospital Stay (HOSPITAL_COMMUNITY): Admit: 2015-07-04 | Payer: Self-pay

## 2015-07-04 VITALS — BP 141/75 | HR 83 | Temp 98.7°F | Resp 18 | Ht 64.5 in | Wt 171.0 lb

## 2015-07-04 DIAGNOSIS — R197 Diarrhea, unspecified: Secondary | ICD-10-CM | POA: Diagnosis present

## 2015-07-04 DIAGNOSIS — C251 Malignant neoplasm of body of pancreas: Secondary | ICD-10-CM

## 2015-07-04 DIAGNOSIS — E860 Dehydration: Secondary | ICD-10-CM

## 2015-07-04 LAB — C DIFFICILE QUICK SCREEN W PCR REFLEX
C DIFFICLE (CDIFF) ANTIGEN: NEGATIVE
C Diff interpretation: NEGATIVE
C Diff toxin: NEGATIVE

## 2015-07-04 LAB — CANCER ANTIGEN 19-9: CA 19-9: 2625.8 U/mL — ABNORMAL HIGH (ref <35.0)

## 2015-07-04 MED ORDER — ONDANSETRON HCL 40 MG/20ML IJ SOLN
Freq: Once | INTRAMUSCULAR | Status: AC
  Administered 2015-07-04: 13:00:00 via INTRAVENOUS
  Filled 2015-07-04: qty 4

## 2015-07-04 MED ORDER — SODIUM CHLORIDE 0.9 % IV SOLN
INTRAVENOUS | Status: AC
  Administered 2015-07-04: 13:00:00 via INTRAVENOUS

## 2015-07-04 NOTE — Progress Notes (Unsigned)
[id number] pt called re: C-diff is negative.

## 2015-07-05 ENCOUNTER — Telehealth: Payer: Self-pay | Admitting: *Deleted

## 2015-07-05 NOTE — Telephone Encounter (Signed)
Voicemail requesting to speak with Annette Beltran.  "Is there anything I can have for pain in my stomach?  It's getting worse.  Called patient.  I have not tried any tylenol or aleve for pain.  I lie down and notice every ten minutes there is a wave of pain that starts on the right and moves across one of my stomachs under my breast.  I have several stomachs."  Uses [facility] and if something has to be called in it needs to be done before they close at 6:00 pm."  Return number [phone].

## 2015-07-05 NOTE — Telephone Encounter (Signed)
Addendum:  "[place] conference with Dr. Jana Hakim.  He wants an MRI of my abdomen, sooner to find out what is going on and I haven't heard anything.  I need an open MRI at [facility].  I am claustrophobic.

## 2015-07-07 ENCOUNTER — Encounter (HOSPITAL_COMMUNITY): Payer: Self-pay

## 2015-07-07 ENCOUNTER — Inpatient Hospital Stay (HOSPITAL_COMMUNITY)
Admission: EM | Admit: 2015-07-07 | Discharge: 2015-08-03 | DRG: 326 | Disposition: E | Payer: Medicare Other | Attending: General Surgery | Admitting: General Surgery

## 2015-07-07 ENCOUNTER — Emergency Department (HOSPITAL_COMMUNITY): Payer: Medicare Other

## 2015-07-07 DIAGNOSIS — A419 Sepsis, unspecified organism: Secondary | ICD-10-CM | POA: Diagnosis not present

## 2015-07-07 DIAGNOSIS — Z808 Family history of malignant neoplasm of other organs or systems: Secondary | ICD-10-CM | POA: Diagnosis not present

## 2015-07-07 DIAGNOSIS — Z923 Personal history of irradiation: Secondary | ICD-10-CM

## 2015-07-07 DIAGNOSIS — R062 Wheezing: Secondary | ICD-10-CM

## 2015-07-07 DIAGNOSIS — Z903 Acquired absence of stomach [part of]: Secondary | ICD-10-CM | POA: Diagnosis present

## 2015-07-07 DIAGNOSIS — J9601 Acute respiratory failure with hypoxia: Secondary | ICD-10-CM | POA: Diagnosis not present

## 2015-07-07 DIAGNOSIS — R000 Tachycardia, unspecified: Secondary | ICD-10-CM | POA: Diagnosis present

## 2015-07-07 DIAGNOSIS — D6489 Other specified anemias: Secondary | ICD-10-CM | POA: Diagnosis not present

## 2015-07-07 DIAGNOSIS — I519 Heart disease, unspecified: Secondary | ICD-10-CM | POA: Diagnosis not present

## 2015-07-07 DIAGNOSIS — Z79899 Other long term (current) drug therapy: Secondary | ICD-10-CM

## 2015-07-07 DIAGNOSIS — R109 Unspecified abdominal pain: Secondary | ICD-10-CM | POA: Diagnosis present

## 2015-07-07 DIAGNOSIS — Z515 Encounter for palliative care: Secondary | ICD-10-CM | POA: Diagnosis not present

## 2015-07-07 DIAGNOSIS — R7989 Other specified abnormal findings of blood chemistry: Secondary | ICD-10-CM | POA: Diagnosis present

## 2015-07-07 DIAGNOSIS — I4891 Unspecified atrial fibrillation: Secondary | ICD-10-CM | POA: Diagnosis not present

## 2015-07-07 DIAGNOSIS — C251 Malignant neoplasm of body of pancreas: Secondary | ICD-10-CM | POA: Diagnosis present

## 2015-07-07 DIAGNOSIS — C785 Secondary malignant neoplasm of large intestine and rectum: Secondary | ICD-10-CM | POA: Diagnosis present

## 2015-07-07 DIAGNOSIS — D473 Essential (hemorrhagic) thrombocythemia: Secondary | ICD-10-CM | POA: Diagnosis present

## 2015-07-07 DIAGNOSIS — K5669 Other intestinal obstruction: Secondary | ICD-10-CM | POA: Diagnosis not present

## 2015-07-07 DIAGNOSIS — I429 Cardiomyopathy, unspecified: Secondary | ICD-10-CM | POA: Diagnosis present

## 2015-07-07 DIAGNOSIS — R0602 Shortness of breath: Secondary | ICD-10-CM | POA: Diagnosis not present

## 2015-07-07 DIAGNOSIS — Z7982 Long term (current) use of aspirin: Secondary | ICD-10-CM | POA: Diagnosis not present

## 2015-07-07 DIAGNOSIS — G92 Toxic encephalopathy: Secondary | ICD-10-CM | POA: Diagnosis not present

## 2015-07-07 DIAGNOSIS — J918 Pleural effusion in other conditions classified elsewhere: Secondary | ICD-10-CM | POA: Diagnosis not present

## 2015-07-07 DIAGNOSIS — I480 Paroxysmal atrial fibrillation: Secondary | ICD-10-CM | POA: Diagnosis not present

## 2015-07-07 DIAGNOSIS — E669 Obesity, unspecified: Secondary | ICD-10-CM | POA: Diagnosis present

## 2015-07-07 DIAGNOSIS — Z87891 Personal history of nicotine dependence: Secondary | ICD-10-CM

## 2015-07-07 DIAGNOSIS — R6521 Severe sepsis with septic shock: Secondary | ICD-10-CM | POA: Diagnosis not present

## 2015-07-07 DIAGNOSIS — Z8249 Family history of ischemic heart disease and other diseases of the circulatory system: Secondary | ICD-10-CM | POA: Diagnosis not present

## 2015-07-07 DIAGNOSIS — Z853 Personal history of malignant neoplasm of breast: Secondary | ICD-10-CM

## 2015-07-07 DIAGNOSIS — R739 Hyperglycemia, unspecified: Secondary | ICD-10-CM | POA: Diagnosis not present

## 2015-07-07 DIAGNOSIS — H3530 Unspecified macular degeneration: Secondary | ICD-10-CM | POA: Diagnosis present

## 2015-07-07 DIAGNOSIS — I214 Non-ST elevation (NSTEMI) myocardial infarction: Secondary | ICD-10-CM | POA: Diagnosis not present

## 2015-07-07 DIAGNOSIS — I2129 ST elevation (STEMI) myocardial infarction involving other sites: Secondary | ICD-10-CM | POA: Diagnosis not present

## 2015-07-07 DIAGNOSIS — J189 Pneumonia, unspecified organism: Secondary | ICD-10-CM

## 2015-07-07 DIAGNOSIS — E870 Hyperosmolality and hypernatremia: Secondary | ICD-10-CM | POA: Diagnosis not present

## 2015-07-07 DIAGNOSIS — Z66 Do not resuscitate: Secondary | ICD-10-CM | POA: Diagnosis present

## 2015-07-07 DIAGNOSIS — C7889 Secondary malignant neoplasm of other digestive organs: Secondary | ICD-10-CM | POA: Diagnosis present

## 2015-07-07 DIAGNOSIS — R740 Nonspecific elevation of levels of transaminase and lactic acid dehydrogenase [LDH]: Secondary | ICD-10-CM | POA: Diagnosis not present

## 2015-07-07 DIAGNOSIS — K660 Peritoneal adhesions (postprocedural) (postinfection): Secondary | ICD-10-CM | POA: Diagnosis present

## 2015-07-07 DIAGNOSIS — J9690 Respiratory failure, unspecified, unspecified whether with hypoxia or hypercapnia: Secondary | ICD-10-CM | POA: Insufficient documentation

## 2015-07-07 DIAGNOSIS — M810 Age-related osteoporosis without current pathological fracture: Secondary | ICD-10-CM | POA: Diagnosis present

## 2015-07-07 DIAGNOSIS — K56609 Unspecified intestinal obstruction, unspecified as to partial versus complete obstruction: Secondary | ICD-10-CM

## 2015-07-07 DIAGNOSIS — Z90411 Acquired partial absence of pancreas: Secondary | ICD-10-CM | POA: Diagnosis present

## 2015-07-07 DIAGNOSIS — Z823 Family history of stroke: Secondary | ICD-10-CM | POA: Diagnosis not present

## 2015-07-07 DIAGNOSIS — Z800 Family history of malignant neoplasm of digestive organs: Secondary | ICD-10-CM

## 2015-07-07 DIAGNOSIS — K7200 Acute and subacute hepatic failure without coma: Secondary | ICD-10-CM | POA: Diagnosis not present

## 2015-07-07 DIAGNOSIS — E872 Acidosis: Secondary | ICD-10-CM | POA: Diagnosis not present

## 2015-07-07 DIAGNOSIS — Z9081 Acquired absence of spleen: Secondary | ICD-10-CM | POA: Diagnosis present

## 2015-07-07 DIAGNOSIS — Z806 Family history of leukemia: Secondary | ICD-10-CM

## 2015-07-07 DIAGNOSIS — Z9221 Personal history of antineoplastic chemotherapy: Secondary | ICD-10-CM

## 2015-07-07 DIAGNOSIS — K219 Gastro-esophageal reflux disease without esophagitis: Secondary | ICD-10-CM | POA: Diagnosis present

## 2015-07-07 DIAGNOSIS — Z9889 Other specified postprocedural states: Secondary | ICD-10-CM

## 2015-07-07 DIAGNOSIS — D72829 Elevated white blood cell count, unspecified: Secondary | ICD-10-CM

## 2015-07-07 DIAGNOSIS — Z6839 Body mass index (BMI) 39.0-39.9, adult: Secondary | ICD-10-CM | POA: Diagnosis not present

## 2015-07-07 DIAGNOSIS — I481 Persistent atrial fibrillation: Secondary | ICD-10-CM | POA: Diagnosis not present

## 2015-07-07 DIAGNOSIS — K639 Disease of intestine, unspecified: Secondary | ICD-10-CM | POA: Diagnosis not present

## 2015-07-07 DIAGNOSIS — Z9049 Acquired absence of other specified parts of digestive tract: Secondary | ICD-10-CM | POA: Diagnosis present

## 2015-07-07 DIAGNOSIS — K56699 Other intestinal obstruction unspecified as to partial versus complete obstruction: Secondary | ICD-10-CM

## 2015-07-07 DIAGNOSIS — Z8507 Personal history of malignant neoplasm of pancreas: Secondary | ICD-10-CM | POA: Diagnosis not present

## 2015-07-07 DIAGNOSIS — J939 Pneumothorax, unspecified: Secondary | ICD-10-CM

## 2015-07-07 DIAGNOSIS — R7401 Elevation of levels of liver transaminase levels: Secondary | ICD-10-CM | POA: Insufficient documentation

## 2015-07-07 DIAGNOSIS — Z803 Family history of malignant neoplasm of breast: Secondary | ICD-10-CM

## 2015-07-07 DIAGNOSIS — C259 Malignant neoplasm of pancreas, unspecified: Secondary | ICD-10-CM | POA: Diagnosis not present

## 2015-07-07 LAB — URINE MICROSCOPIC-ADD ON

## 2015-07-07 LAB — URINALYSIS, ROUTINE W REFLEX MICROSCOPIC
Bilirubin Urine: NEGATIVE
Glucose, UA: NEGATIVE mg/dL
HGB URINE DIPSTICK: NEGATIVE
Ketones, ur: NEGATIVE mg/dL
Nitrite: NEGATIVE
PH: 6.5 (ref 5.0–8.0)
Protein, ur: NEGATIVE mg/dL
Urobilinogen, UA: 0.2 mg/dL (ref 0.0–1.0)

## 2015-07-07 LAB — COMPREHENSIVE METABOLIC PANEL
ALBUMIN: 3 g/dL — AB (ref 3.5–5.0)
ALK PHOS: 75 U/L (ref 38–126)
ALT: 16 U/L (ref 14–54)
AST: 16 U/L (ref 15–41)
Anion gap: 8 (ref 5–15)
BUN: 12 mg/dL (ref 6–20)
CALCIUM: 8.1 mg/dL — AB (ref 8.9–10.3)
CHLORIDE: 99 mmol/L — AB (ref 101–111)
CO2: 27 mmol/L (ref 22–32)
CREATININE: 0.63 mg/dL (ref 0.44–1.00)
GFR calc Af Amer: >60 mL/min (ref >60)
GFR calc non Af Amer: >60 mL/min (ref >60)
GLUCOSE: 135 mg/dL — AB (ref 65–99)
Potassium: 3.3 mmol/L — ABNORMAL LOW (ref 3.5–5.1)
SODIUM: 134 mmol/L — AB (ref 135–145)
Total Bilirubin: 0.6 mg/dL (ref 0.3–1.2)
Total Protein: 6.1 g/dL — ABNORMAL LOW (ref 6.5–8.1)

## 2015-07-07 LAB — LIPASE, BLOOD

## 2015-07-07 MED ORDER — ONDANSETRON HCL 4 MG/2ML IJ SOLN
4.0000 mg | Freq: Once | INTRAMUSCULAR | Status: AC
  Administered 2015-07-07: 4 mg via INTRAVENOUS
  Filled 2015-07-07: qty 2

## 2015-07-07 MED ORDER — HYDROMORPHONE HCL 1 MG/ML IJ SOLN
1.0000 mg | INTRAMUSCULAR | Status: DC | PRN
  Administered 2015-07-07: 0.5 mg via INTRAVENOUS
  Administered 2015-07-08 – 2015-07-14 (×10): 1 mg via INTRAVENOUS
  Administered 2015-07-15: 2 mg via INTRAVENOUS
  Filled 2015-07-07 (×8): qty 1
  Filled 2015-07-07: qty 2
  Filled 2015-07-07 (×5): qty 1

## 2015-07-07 MED ORDER — SODIUM CHLORIDE 0.9 % IV BOLUS (SEPSIS)
1000.0000 mL | Freq: Once | INTRAVENOUS | Status: AC
  Administered 2015-07-07: 1000 mL via INTRAVENOUS

## 2015-07-07 MED ORDER — IOHEXOL 300 MG/ML  SOLN
100.0000 mL | Freq: Once | INTRAMUSCULAR | Status: AC | PRN
  Administered 2015-07-07: 100 mL via INTRAVENOUS

## 2015-07-07 MED ORDER — HYDROMORPHONE HCL 1 MG/ML IJ SOLN
0.5000 mg | Freq: Once | INTRAMUSCULAR | Status: AC
  Administered 2015-07-07: 0.5 mg via INTRAVENOUS
  Filled 2015-07-07: qty 1

## 2015-07-07 NOTE — ED Notes (Signed)
Pt only able to drink a small amount of CT oral contrast. She reports the pain worsens with drinking.

## 2015-07-07 NOTE — ED Notes (Signed)
Pt doesn't wish to try for UA

## 2015-07-07 NOTE — ED Notes (Signed)
She c/o abd. Pain and bloating x 3-4 days.  She reports recent anorexia to the point she has received two IV fluid infusions at our [facility] infusion room.  He abd. Is soft and distended.  She is able to ambulate with minimal difficulty, and her husband is with her.

## 2015-07-07 NOTE — ED Notes (Signed)
MD Zammit at bedside discussing plan of care and results. He reports it is okay for pt to have ice chips.

## 2015-07-07 NOTE — ED Provider Notes (Signed)
CSN: [id number]     Arrival date & time 07/14/2015  1826 History   First MD Initiated Contact with Patient 07/30/2015 1842     Chief Complaint  Patient presents with  . Abdominal Pain     (Consider location/radiation/quality/duration/timing/severity/associated sxs/prior Treatment) Patient is a 69 y.o. female presenting with abdominal pain. The history is provided by the patient (The patient states that for the last week she's been having some nausea and diarrhea. She's been seen twice in the oncology clinic and received IV fluids. Today she says that her abdomen seems to be more distended has not been eating anything today. She st).  Abdominal Pain Pain location:  Generalized Pain quality: aching   Pain radiates to:  Does not radiate Pain severity:  Moderate Onset quality:  Gradual Timing:  Constant Progression:  Waxing and waning Chronicity:  Recurrent Context: not alcohol use   Associated symptoms: vomiting   Associated symptoms: no chest pain, no cough, no diarrhea, no fatigue and no hematuria     Past Medical History  Diagnosis Date  . Macular degeneration   . Fluid retention   . Colitis, collagenous   . Vitamin D deficiency   . Osteopenia   . Celiac artery stenosis   . Splenic infarction   . Arthritis   . History of blood clots   . Anxiety   . Shingles   . Pancreatitis   . Osteoporosis   . Clotting disorder   . Arrhythmia     "skips a beat"  [facility]auer  heart  . GERD (gastroesophageal reflux disease)   . Breast cancer     rt lumpectomy   Past Surgical History  Procedure Laterality Date  . Foot surgery  2003    x 3, 2 on right, 1 on left  . Breast lumpectomy Right     radiation for 6 weeks  . Meniscus repair Right   . Eus N/A 11/23/2013    Procedure: UPPER ENDOSCOPIC ULTRASOUND (EUS) LINEAR;  Surgeon: Milus Banister, MD;  Location: [facility];  Service: Endoscopy;  Laterality: N/A;  . Tonsillectomy    . Laparoscopy N/A 12/20/2013    Procedure: LAPAROSCOPY  DIAGNOSTIC ;  Surgeon: [name], MD;  Location: [place];  Service: General;  Laterality: N/A;  . Cholecystectomy N/A 12/20/2013    Procedure: LAPAROSCOPIC CHOLECYSTECTOMY;  Surgeon: [name], MD;  Location: [place];  Service: General;  Laterality: N/A;  . Splenectomy, total N/A 12/20/2013    Procedure: SPLENECTOMY;  Surgeon: [name], MD;  Location: [place];  Service: General;  Laterality: N/A;  . Partial gastrectomy N/A 12/20/2013    Procedure: PARTIAL GASTRECTOMY;  Surgeon: [name], MD;  Location: [place];  Service: General;  Laterality: N/A;  . Portacath placement N/A 01/15/2014    Procedure: INSERTION PORT-A-CATH;  Surgeon: [name], MD;  Location: [facility];  Service: General;  Laterality: N/A;   Family History  Problem Relation Age of Onset  . Colon cancer Neg Hx   . Aneurysm Mother   . Stroke Mother   . Prostate cancer Father 33  . Skin cancer Brother 69    treated with interferon for 1 year  . Heart failure Paternal Aunt   . Breast cancer Paternal Aunt     dx over 54s  . Pancreatic cancer Maternal Grandmother     dx in her 42s  . Breast cancer Maternal Aunt     dx over 87  . Rheum arthritis Brother   . Breast cancer  Paternal Aunt     dx over 44s  . Leukemia Paternal Aunt    Social History  Substance Use Topics  . Smoking status: Former Smoker -- 0.50 packs/day for 28 years    Types: Cigarettes    Quit date: 11/02/2000  . Smokeless tobacco: Never Used  . Alcohol Use: Yes     Comment: occ wine    OB History    No data available     Review of Systems  Constitutional: Negative for appetite change and fatigue.  HENT: Negative for congestion, ear discharge and sinus pressure.   Eyes: Negative for discharge.  Respiratory: Negative for cough.   Cardiovascular: Negative for chest pain.  Gastrointestinal: Positive for vomiting and abdominal pain. Negative for diarrhea.  Genitourinary: Negative for frequency and hematuria.  Musculoskeletal: Negative for back  pain.  Skin: Negative for rash.  Neurological: Negative for seizures and headaches.  Psychiatric/Behavioral: Negative for hallucinations.      Allergies  Tape; Codeine; Lactose intolerance (gi); Tequin; Vancomycin; and Penicillins  Home Medications   Prior to Admission medications   Medication Sig Start Date End Date Taking? Authorizing Provider  ALPRAZolam Duanne Moron) 0.5 MG tablet Take 1 tablet (0.5 mg total) by mouth at bedtime as needed for anxiety. 01/15/15  Yes [name], MD  antiseptic oral rinse (BIOTENE) LIQD 15 mLs by Mouth Rinse route as needed for dry mouth.   Yes Historical Provider, MD  aspirin EC 81 MG tablet Take 81 mg by mouth every morning.    Yes Historical Provider, MD  b complex vitamins tablet Take 1 tablet by mouth every morning.    Yes Historical Provider, MD  Biotin 1 MG CAPS Take 1 capsule by mouth daily at 12 noon.    Yes Historical Provider, MD  CREON 12000 UNITS CPEP capsule TAKE TWO CAPSULES THREE TIMES DAILY WITH MEALS 01/09/15  Yes Chauncey Cruel, MD  diphenhydrAMINE (BENADRYL) 25 MG tablet Take 25 mg by mouth at bedtime as needed for sleep.    Yes Historical Provider, MD  loratadine (CLARITIN) 10 MG tablet Take 10 mg by mouth daily as needed for allergies.    Yes Historical Provider, MD  Multiple Vitamins-Minerals (PRESERVISION AREDS PO) Take 1 capsule by mouth 2 (two) times daily.   Yes Historical Provider, MD  omeprazole (PRILOSEC) 40 MG capsule Take 1 capsule (40 mg total) by mouth daily. 08/15/14  Yes Chauncey Cruel, MD  ondansetron (ZOFRAN) 8 MG tablet Take 1 tablet (8 mg total) by mouth every 8 (eight) hours as needed for nausea or vomiting. 07/03/15  Yes Susanne Borders, NP  PARoxetine HCl (PAXIL PO) Take 1 tablet by mouth daily.   Yes Historical Provider, MD  PARoxetine (PAXIL) 20 MG tablet Take 2 tablets (40 mg total) by mouth daily. Patient not taking: Reported on 07/17/2015 03/23/15   Chauncey Cruel, MD   BP 120/52 mmHg  Pulse 72   Temp(Src) 98.6 F (37 C) (Oral)  Resp 14  SpO2 92% Physical Exam  Constitutional: She is oriented to person, place, and time. She appears well-developed.  HENT:  Head: Normocephalic.  Eyes: Conjunctivae and EOM are normal. No scleral icterus.  Neck: Neck supple. No thyromegaly present.  Cardiovascular: Normal rate and regular rhythm.  Exam reveals no gallop and no friction rub.   No murmur heard. Pulmonary/Chest: No stridor. She has no wheezes. She has no rales. She exhibits no tenderness.  Abdominal: She exhibits distension. There is tenderness. There is no rebound.  Musculoskeletal: Normal range of motion. She exhibits no edema.  Lymphadenopathy:    She has no cervical adenopathy.  Neurological: She is oriented to person, place, and time. She exhibits normal muscle tone. Coordination normal.  Skin: No rash noted. No erythema.  Psychiatric: She has a normal mood and affect. Her behavior is normal.    ED Course  Procedures (including critical care time) Labs Review Labs Reviewed  LIPASE, BLOOD - Abnormal; Notable for the following:    Lipase <10 (*)    All other components within normal limits  COMPREHENSIVE METABOLIC PANEL - Abnormal; Notable for the following:    Sodium 134 (*)    Potassium 3.3 (*)    Chloride 99 (*)    Glucose, Bld 135 (*)    Calcium 8.1 (*)    Total Protein 6.1 (*)    Albumin 3.0 (*)    All other components within normal limits  URINALYSIS, ROUTINE W REFLEX MICROSCOPIC (NOT AT [facility]) - Abnormal; Notable for the following:    Specific Gravity, Urine >1.046 (*)    Leukocytes, UA SMALL (*)    All other components within normal limits  URINE MICROSCOPIC-ADD ON - Abnormal; Notable for the following:    Squamous Epithelial / LPF FEW (*)    Bacteria, UA FEW (*)    All other components within normal limits  CBC WITH DIFFERENTIAL/PLATELET    Imaging Review Ct Abdomen Pelvis W Contrast  07/26/2015   CLINICAL DATA:  Upper and lower abdominal pain starting on  Monday. Bloating. Diarrhea. Nausea. Pancreatic cancer diagnosed in January, 2005 2015, with prior Whipple procedure, chemotherapy, and radiation. Remote breast cancer.  EXAM: CT ABDOMEN AND PELVIS WITH CONTRAST  TECHNIQUE: Multidetector CT imaging of the abdomen and pelvis was performed using the standard protocol following bolus administration of intravenous contrast.  CONTRAST:  175mL OMNIPAQUE IOHEXOL 300 MG/ML  SOLN  COMPARISON:  05/13/2015  FINDINGS: Lower chest:  Trace right pleural effusion with passive atelectasis.  Hepatobiliary: Steatosis primarily involving the right hepatic lobe but with some bandlike steatosis along parts of the left hepatic lobe. Hyper enhancement in the left hepatic lobe, chronic, likely due to the diminutive left portal vein causing delayed drainage.  In the dome a segment 8, a small enhancing lesion is stable and likely a perfusion anomaly. Gallbladder absent.  Pancreas: Clips are noted in the expected location of the pancreas were there is a band of fibrosis or tissue along the expected spot of the pancreatic tail and body. No increase in the volume of soft tissue in this vicinity, which was previously not significantly hypermetabolic on PET-CT. Pancreatic head unremarkable.  Spleen: Splenectomy  Adrenals/Urinary Tract: Unremarkable  Stomach/Bowel: Partial gastrectomy. Along the gastrectomy staple line in the left upper quadrant, there is an angled loop of splenic flexure of the colon marking a transition point between dilated and nondilated colon. The distal small bowel is also dilated. Appearance compatible with colonic obstruction due to adhesion. There is wall thickening in the colon leading up to the transition point, as well as wall thickening in the distal ileum and scattered air-fluid levels. Interestingly there are some air- fluid levels in the nondilated distal colon as well, particularly in the sigmoid.  Vascular/Lymphatic: Aortoiliac atherosclerotic vascular disease.  The celiac trunk appears stenotic.  Reproductive: Unremarkable  Other: Mesenteric edema along the pelvic small bowel loops, as on image 63 series 2.  Musculoskeletal: Supraumbilical ventral hernia contains adipose tissue.  IMPRESSION: 1. Bowel obstruction at the level of the  splenic flexure the colon due to an adhesion along the adjacent partial gastrectomy staple line. At this point there is a transition between dilated and thick-walled colon and the nondilated distal colon. There are air-fluid levels diffusely in the colon and small bowel, and dilation of the distal small bowel, with edema in the mesentery of several small bowel loops. 2. No specific findings of pancreatic cancer recurrence. 3. Trace right pleural effusion. 4. Geographic hepatic steatosis. Hyper enhancement of the left hepatic lobe due to left portal vein diminutive size.   Electronically Signed   By: Van Clines M.D.   On: 07/30/2015 21:42   I have personally reviewed and evaluated these images and lab results as part of my medical decision-making.   EKG Interpretation None      MDM   Final diagnoses:  SBO (small bowel obstruction)    Surgery to consult and admit for sbo    Milton Ferguson, MD 07/25/2015 2245

## 2015-07-07 NOTE — ED Notes (Signed)
Pt unable to void 

## 2015-07-07 NOTE — ED Notes (Addendum)
Dr. Hoxworth at bedside.  

## 2015-07-07 NOTE — ED Notes (Signed)
Pt requesting water or ice chips. This RN informed her that we are awaiting the CT results and that decision would be determined upon results.

## 2015-07-07 NOTE — ED Notes (Addendum)
Pt reports she may not be able to drink CT oral contrast but was asked to as much as possible without vomiting. This RN offered nausea medication however, pt declined.

## 2015-07-07 NOTE — H&P (Signed)
Annette Beltran is an 69 y.o. female.    Chief Complaint: Abdominal pain  HPI: She presents to the emergency department due to ongoing crampy abdominal pain, distention and nausea for the past week. She has a significant history of pancreatic cancer as below: 3) status post endoscopic ultrasonography of the pancreas 11/23/2013 with cytology positive for adenocarcinoma, clinical stage T2 NX MX adenocarcinoma associated with a 5.5 cm pseudocyst (which was aspirated); baseline CA 19-9 was 47.0  (4) left pleural effusion status post thoracentesis 12/01/2013, cytology negative  (5) s/p laparoscopic cholecystectomy, open extended distal pancreatectomy, splenectomy, partial gastrectomy x2 12/20/2013 for a pT1 pN0, stage IA invasive ductal adenocarcinoma, grade 2, with positive resection margins (a) "triple vaccination"(meningococcus, pneumococcus, Haemophilus) given 08/15/2014  (6) adjuvant gemcitabine started 02/01/2014-- received 3 weekly doses before radiation, will reeive 9 additional doses starting 3-5 weeks after completion of radiation  (7) adjuvant radiation started 02/27/2014, with radiosensitization (capecitabine 1000 mg po BID on radiation days); completed 04/06/2014  (8) adjuvant gemcitabine weekly resumed 05/07/2014, completed 9 doses 07/10/2014  (9) rise in CA 19 noted 11/08/2014: CT scan of the abdomen and pelvis obtained the same day showed no evidence of disease recurrence; PET scan 11/20/2014 and MRI of the abdomen 12/30/2014 and 03/16/2015, and repeat PET scan 05/03/2015 show no measurable disease despite continuing CA 19 rise  She was doing well until about 1 week ago when she began to develop episodic crampy abdominal pain and distention. This was associated with nausea and occasional gagging and dry heaves. No large volume emesis. She was treated in the oncology office on 2 occasions last week with IV fluids and nausea medication. She initially had quite a bit of  watery diarrhea. This has slowed down and she's had only about 4 small loose stools in the past 24 hours. She describes waves of crampy pain moving from the right side of her abdomen across to the left across her upper abdomen. She has been significantly distended. No constant pain. No fever or chills. No melena or hematochezia.  Past Medical History  Diagnosis Date  . Macular degeneration   . Fluid retention   . Colitis, collagenous   . Vitamin D deficiency   . Osteopenia   . Celiac artery stenosis   . Splenic infarction   . Arthritis   . History of blood clots   . Anxiety   . Shingles   . Pancreatitis   . Osteoporosis   . Clotting disorder   . Arrhythmia     "skips a beat"  Labauer  heart  . GERD (gastroesophageal reflux disease)   . Breast cancer     rt lumpectomy    Past Surgical History  Procedure Laterality Date  . Foot surgery  2003    x 3, 2 on right, 1 on left  . Breast lumpectomy Right     radiation for 6 weeks  . Meniscus repair Right   . Eus N/A 11/23/2013    Procedure: UPPER ENDOSCOPIC ULTRASOUND (EUS) LINEAR;  Surgeon: Milus Banister, MD;  Location: [facility];  Service: Endoscopy;  Laterality: N/A;  . Tonsillectomy    . Laparoscopy N/A 12/20/2013    Procedure: LAPAROSCOPY DIAGNOSTIC ;  Surgeon: [name], MD;  Location: [place];  Service: General;  Laterality: N/A;  . Cholecystectomy N/A 12/20/2013    Procedure: LAPAROSCOPIC CHOLECYSTECTOMY;  Surgeon: [name], MD;  Location: [place];  Service: General;  Laterality: N/A;  . Splenectomy, total N/A 12/20/2013    Procedure:  SPLENECTOMY;  Surgeon: [name], MD;  Location: [place];  Service: General;  Laterality: N/A;  . Partial gastrectomy N/A 12/20/2013    Procedure: PARTIAL GASTRECTOMY;  Surgeon: [name], MD;  Location: [place];  Service: General;  Laterality: N/A;  . Portacath placement N/A 01/15/2014    Procedure: INSERTION PORT-A-CATH;  Surgeon: [name], MD;  Location: [facility];  Service: General;   Laterality: N/A;    Family History  Problem Relation Age of Onset  . Colon cancer Neg Hx   . Aneurysm Mother   . Stroke Mother   . Prostate cancer Father 66  . Skin cancer Brother 91    treated with interferon for 1 year  . Heart failure Paternal Aunt   . Breast cancer Paternal Aunt     dx over 47s  . Pancreatic cancer Maternal Grandmother     dx in her 58s  . Breast cancer Maternal Aunt     dx over 29  . Rheum arthritis Brother   . Breast cancer Paternal Aunt     dx over 49s  . Leukemia Paternal Aunt    Social History:  reports that she quit smoking about 14 years ago. Her smoking use included Cigarettes. She has a 14 pack-year smoking history. She has never used smokeless tobacco. She reports that she drinks alcohol. She reports that she does not use illicit drugs.  Allergies:  Allergies  Allergen Reactions  . Tape     Allergic  To  Tegaderm.  . Codeine Nausea And Vomiting  . Lactose Intolerance (Gi)   . Tequin     Severe stomach pain  . Vancomycin Itching    Whelps on arm of Infusing IV site  . Penicillins Nausea Only and Rash     (Not in a hospital admission)  Results for orders placed or performed during the hospital encounter of 07/16/2015 (from the past 48 hour(s))  Lipase, blood     Status: Abnormal   Collection Time: 08/02/2015  6:55 PM  Result Value Ref Range   Lipase <10 (L) 22 - 51 U/L  Comprehensive metabolic panel     Status: Abnormal   Collection Time: 07/22/2015  6:55 PM  Result Value Ref Range   Sodium 134 (L) 135 - 145 mmol/L   Potassium 3.3 (L) 3.5 - 5.1 mmol/L   Chloride 99 (L) 101 - 111 mmol/L   CO2 27 22 - 32 mmol/L   Glucose, Bld 135 (H) 65 - 99 mg/dL   BUN 12 6 - 20 mg/dL   Creatinine, Ser 0.63 0.44 - 1.00 mg/dL   Calcium 8.1 (L) 8.9 - 10.3 mg/dL   Total Protein 6.1 (L) 6.5 - 8.1 g/dL   Albumin 3.0 (L) 3.5 - 5.0 g/dL   AST 16 15 - 41 U/L   ALT 16 14 - 54 U/L   Alkaline Phosphatase 75 38 - 126 U/L   Total Bilirubin 0.6 0.3 - 1.2 mg/dL    GFR calc non Af Amer >60 >60 mL/min   GFR calc Af Amer >60 >60 mL/min    Comment: (NOTE) The eGFR has been calculated using the CKD EPI equation. This calculation has not been validated in all clinical situations. eGFR's persistently <60 mL/min signify possible Chronic Kidney Disease.    Anion gap 8 5 - 15  Urinalysis, Routine w reflex microscopic (not at [facility])     Status: Abnormal   Collection Time: 07/23/2015  9:32 PM  Result Value Ref Range   Color,  Urine YELLOW YELLOW   APPearance CLEAR CLEAR   Specific Gravity, Urine >1.046 (H) 1.005 - 1.030   pH 6.5 5.0 - 8.0   Glucose, UA NEGATIVE NEGATIVE mg/dL   Hgb urine dipstick NEGATIVE NEGATIVE   Bilirubin Urine NEGATIVE NEGATIVE   Ketones, ur NEGATIVE NEGATIVE mg/dL   Protein, ur NEGATIVE NEGATIVE mg/dL   Urobilinogen, UA 0.2 0.0 - 1.0 mg/dL   Nitrite NEGATIVE NEGATIVE   Leukocytes, UA SMALL (A) NEGATIVE  Urine microscopic-add on     Status: Abnormal   Collection Time: 07/10/2015  9:32 PM  Result Value Ref Range   Squamous Epithelial / LPF FEW (A) RARE   WBC, UA 11-20 <3 WBC/hpf   Bacteria, UA FEW (A) RARE   Urine-Other LESS THAN 10 mL OF URINE SUBMITTED    Ct Abdomen Pelvis W Contrast  07/13/2015   CLINICAL DATA:  Upper and lower abdominal pain starting on Monday. Bloating. Diarrhea. Nausea. Pancreatic cancer diagnosed in January, 2005 2015, with prior Whipple procedure, chemotherapy, and radiation. Remote breast cancer.  EXAM: CT ABDOMEN AND PELVIS WITH CONTRAST  TECHNIQUE: Multidetector CT imaging of the abdomen and pelvis was performed using the standard protocol following bolus administration of intravenous contrast.  CONTRAST:  187m OMNIPAQUE IOHEXOL 300 MG/ML  SOLN  COMPARISON:  05/13/2015  FINDINGS: Lower chest:  Trace right pleural effusion with passive atelectasis.  Hepatobiliary: Steatosis primarily involving the right hepatic lobe but with some bandlike steatosis along parts of the left hepatic lobe. Hyper enhancement in the  left hepatic lobe, chronic, likely due to the diminutive left portal vein causing delayed drainage.  In the dome a segment 8, a small enhancing lesion is stable and likely a perfusion anomaly. Gallbladder absent.  Pancreas: Clips are noted in the expected location of the pancreas were there is a band of fibrosis or tissue along the expected spot of the pancreatic tail and body. No increase in the volume of soft tissue in this vicinity, which was previously not significantly hypermetabolic on PET-CT. Pancreatic head unremarkable.  Spleen: Splenectomy  Adrenals/Urinary Tract: Unremarkable  Stomach/Bowel: Partial gastrectomy. Along the gastrectomy staple line in the left upper quadrant, there is an angled loop of splenic flexure of the colon marking a transition point between dilated and nondilated colon. The distal small bowel is also dilated. Appearance compatible with colonic obstruction due to adhesion. There is wall thickening in the colon leading up to the transition point, as well as wall thickening in the distal ileum and scattered air-fluid levels. Interestingly there are some air- fluid levels in the nondilated distal colon as well, particularly in the sigmoid.  Vascular/Lymphatic: Aortoiliac atherosclerotic vascular disease. The celiac trunk appears stenotic.  Reproductive: Unremarkable  Other: Mesenteric edema along the pelvic small bowel loops, as on image 63 series 2.  Musculoskeletal: Supraumbilical ventral hernia contains adipose tissue.  IMPRESSION: 1. Bowel obstruction at the level of the splenic flexure the colon due to an adhesion along the adjacent partial gastrectomy staple line. At this point there is a transition between dilated and thick-walled colon and the nondilated distal colon. There are air-fluid levels diffusely in the colon and small bowel, and dilation of the distal small bowel, with edema in the mesentery of several small bowel loops. 2. No specific findings of pancreatic cancer  recurrence. 3. Trace right pleural effusion. 4. Geographic hepatic steatosis. Hyper enhancement of the left hepatic lobe due to left portal vein diminutive size.   Electronically Signed   By: WCindra EvesD.  On: 07/21/2015 21:42    Review of Systems  Constitutional: Negative for fever, chills and weight loss.  Respiratory: Negative.   Cardiovascular: Negative.   Gastrointestinal: Positive for nausea, abdominal pain and diarrhea. Negative for vomiting, constipation and blood in stool.  Genitourinary: Negative.   Psychiatric/Behavioral: Positive for depression.    Blood pressure 120/52, pulse 72, temperature 98.6 F (37 C), temperature source Oral, resp. rate 14, SpO2 92 %. Physical Exam  General: Alert, well-developed Caucasian female, in no distress Skin: Warm and dry without rash or infection. HEENT: No palpable masses or thyromegaly. Sclera nonicteric. Pupils equal round and reactive. Oropharynx clear. Lymph nodes: No cervical, supraclavicular, or inguinal nodes palpable. Lungs: Breath sounds clear and equal without increased work of breathing Cardiovascular: Regular rate and rhythm without murmur. No JVD or edema. Peripheral pulses intact. Abdomen: Moderately distended and tympanitic. Well-healed upper midline incision. Active bowel sounds. No significant tenderness. No masses palpable. No organomegaly. No palpable hernias. Extremities: No edema or joint swelling or deformity. No chronic venous stasis changes. Neurologic: Alert and fully oriented. Affect normal.  Assessment/Plan Patient with history of cancer of the body of the pancreas, status post distal pancreatectomy/splenectomy and partial gastrectomy with positive margins. She has undergone chemotherapy and radiation therapy. She has had rising CA-19-9 with no evidence of tumor found on PET/CT. She now presents with high-grade obstruction at the splenic flexure of her colon. There appears to be some kinking at adhesion to  the gastrectomy staple line at this point. No obvious tumor seen. She could have obstruction possibly secondary to adhesions or scarring although this would be unusual. Radiation stricture or tumor or other possibilities. The diagnosis was discussed with the patient and her family. She will be admitted tonight for IV fluids and pain medication. This very likely will require surgical exploration. May require colostomy. Will ask GI to see regarding possible colonoscopy and stenting to allow decompression and bowel prep although in this location I expect this is not feasible. She is in no acute distress and nontender with normal white count. All of this was discussed with the patient and her family and questions answered.  Lounell Schumacher T 08/01/2015, 10:58 PM

## 2015-07-07 NOTE — ED Notes (Signed)
Pt c/o upper and lower abdominal pain. Pt sts the pain started last Monday. Pt reports bloating started with abdominal pain but worsened today. Pt reported diarrhea started Monday night.  Pt took imodium which relieved the diarrhea mostly. Pt still reports diarrhea. Pt reports nausea and denies vomiting. Last normal BM Monday.

## 2015-07-08 ENCOUNTER — Inpatient Hospital Stay (HOSPITAL_COMMUNITY): Payer: Medicare Other

## 2015-07-08 DIAGNOSIS — Z8507 Personal history of malignant neoplasm of pancreas: Secondary | ICD-10-CM

## 2015-07-08 DIAGNOSIS — K5669 Other intestinal obstruction: Principal | ICD-10-CM

## 2015-07-08 LAB — BASIC METABOLIC PANEL
Anion gap: 4 — ABNORMAL LOW (ref 5–15)
BUN: 8 mg/dL (ref 6–20)
CHLORIDE: 104 mmol/L (ref 101–111)
CO2: 27 mmol/L (ref 22–32)
CREATININE: 0.58 mg/dL (ref 0.44–1.00)
Calcium: 7.5 mg/dL — ABNORMAL LOW (ref 8.9–10.3)
GFR calc Af Amer: >60 mL/min (ref >60)
GFR calc non Af Amer: >60 mL/min (ref >60)
GLUCOSE: 150 mg/dL — AB (ref 65–99)
POTASSIUM: 3.4 mmol/L — AB (ref 3.5–5.1)
SODIUM: 135 mmol/L (ref 135–145)

## 2015-07-08 LAB — CBC
HEMATOCRIT: 36.1 % (ref 36.0–46.0)
Hemoglobin: 12.8 g/dL (ref 12.0–15.0)
MCH: 31.9 pg (ref 26.0–34.0)
MCHC: 35.5 g/dL (ref 30.0–36.0)
MCV: 90 fL (ref 78.0–100.0)
PLATELETS: 380 10*3/uL (ref 150–400)
RBC: 4.01 MIL/uL (ref 3.87–5.11)
RDW: 13.1 % (ref 11.5–15.5)
WBC: 14.9 10*3/uL — ABNORMAL HIGH (ref 4.0–10.5)

## 2015-07-08 MED ORDER — POTASSIUM CHLORIDE 10 MEQ/100ML IV SOLN
10.0000 meq | INTRAVENOUS | Status: AC
  Administered 2015-07-08 (×2): 10 meq via INTRAVENOUS
  Filled 2015-07-08 (×2): qty 100

## 2015-07-08 MED ORDER — ALPRAZOLAM 0.5 MG PO TABS
0.5000 mg | ORAL_TABLET | Freq: Every evening | ORAL | Status: DC | PRN
  Administered 2015-07-08 – 2015-07-10 (×4): 0.5 mg via ORAL
  Filled 2015-07-08 (×3): qty 1

## 2015-07-08 MED ORDER — ONDANSETRON 4 MG PO TBDP
4.0000 mg | ORAL_TABLET | Freq: Four times a day (QID) | ORAL | Status: DC | PRN
  Filled 2015-07-08: qty 1

## 2015-07-08 MED ORDER — PRESERVISION AREDS PO CAPS: 1.0000 | ORAL_CAPSULE | Freq: Two times a day (BID) | ORAL | Status: DC

## 2015-07-08 MED ORDER — PANTOPRAZOLE SODIUM 40 MG IV SOLR
40.0000 mg | Freq: Every day | INTRAVENOUS | Status: DC
  Administered 2015-07-08 – 2015-07-10 (×4): 40 mg via INTRAVENOUS
  Filled 2015-07-08 (×5): qty 40

## 2015-07-08 MED ORDER — ENOXAPARIN SODIUM 40 MG/0.4ML ~~LOC~~ SOLN
40.0000 mg | SUBCUTANEOUS | Status: AC
  Administered 2015-07-08 – 2015-07-10 (×2): 40 mg via SUBCUTANEOUS
  Filled 2015-07-08 (×3): qty 0.4

## 2015-07-08 MED ORDER — OCUVITE-LUTEIN PO CAPS
1.0000 | ORAL_CAPSULE | Freq: Every day | ORAL | Status: DC
  Filled 2015-07-08 (×2): qty 1

## 2015-07-08 MED ORDER — KCL IN DEXTROSE-NACL 20-5-0.9 MEQ/L-%-% IV SOLN
INTRAVENOUS | Status: DC
  Administered 2015-07-08 – 2015-07-12 (×7): via INTRAVENOUS
  Filled 2015-07-08 (×15): qty 1000

## 2015-07-08 MED ORDER — DIPHENHYDRAMINE HCL 25 MG PO CAPS: 25.0000 mg | ORAL_CAPSULE | Freq: Every evening | ORAL | Status: DC | PRN

## 2015-07-08 MED ORDER — ZOLPIDEM TARTRATE 5 MG PO TABS
5.0000 mg | ORAL_TABLET | Freq: Every evening | ORAL | Status: DC | PRN
  Filled 2015-07-08: qty 1

## 2015-07-08 MED ORDER — ONDANSETRON HCL 4 MG/2ML IJ SOLN: 4.0000 mg | Freq: Four times a day (QID) | INTRAMUSCULAR | Status: DC | PRN

## 2015-07-08 NOTE — Progress Notes (Signed)
First tap water enema given at this time.

## 2015-07-08 NOTE — Consult Note (Signed)
Consultation  Referring Provider: Dr. Excell Seltzer Primary Care Physician:  [name], MD Primary Gastroenterologist:  Dr. Olevia Perches, has also seen Dr. Ardis Hughs  Reason for Consultation:  Colonic obstruction  HPI: Annette Beltran is a 69 y.o. female with history of pancreatic adenocarcinoma status post laparoscopic cholecystectomy, open ended distal pancreatectomy, splenectomy, and partial gastrectomy in February 2015, also status post chemotherapy and radiation admitted to the surgical service for abdominal pain and distention and nausea found to have a high-grade colonic obstruction near the splenic flexure. She reports she was doing well until about a week ago when she developed episodic abdominal pain. She noticed increasing abdominal distention. Pain was crampy and at times sharp. She had associated nausea as well as dry heaves but never true vomiting. She been receiving fluids and anti-emetics by oncology. Pain and distention became more severe and she presented to the ER.  She has been followed by oncology and has had a rising CA-19-9 though no evidence of tumor recurrence by PET scan.  Cross-sectional imaging was performed which showed colonic obstruction in the left upper quadrant possibly related to adhesive disease or even kinking related to gastrectomy staple line closest to the splenic flexure of the colon. There is no obvious recurrence of tumor by recent imaging. Dr. Excell Seltzer feels the patient will require surgery but asks for our help to evaluate the area endoscopically if possible to determine if this appears to be benign or malignant and possibly stent if possible. Being able to cross the obstruction would allow for more adequate bowel prepping and possibly primary anastomosis versus colostomy.  Patient has been nothing by mouth. Did have a large bowel movement which was loose last night. No obvious blood. Feeling some better this morning. Denies pain currently.   Past Medical  History  Diagnosis Date  . Macular degeneration   . Fluid retention   . Colitis, collagenous   . Vitamin D deficiency   . Osteopenia   . Celiac artery stenosis   . Splenic infarction   . Arthritis   . History of blood clots   . Anxiety   . Shingles   . Pancreatitis   . Osteoporosis   . Clotting disorder   . Arrhythmia     "skips a beat"  [place]  heart  . GERD (gastroesophageal reflux disease)   . Breast cancer     rt lumpectomy    Past Surgical History  Procedure Laterality Date  . Foot surgery  2003    x 3, 2 on right, 1 on left  . Breast lumpectomy Right     radiation for 6 weeks  . Meniscus repair Right   . Eus N/A 11/23/2013    Procedure: UPPER ENDOSCOPIC ULTRASOUND (EUS) LINEAR;  Surgeon: Milus Banister, MD;  Location: [facility];  Service: Endoscopy;  Laterality: N/A;  . Tonsillectomy    . Laparoscopy N/A 12/20/2013    Procedure: LAPAROSCOPY DIAGNOSTIC ;  Surgeon: [name], MD;  Location: [place];  Service: General;  Laterality: N/A;  . Cholecystectomy N/A 12/20/2013    Procedure: LAPAROSCOPIC CHOLECYSTECTOMY;  Surgeon: [name], MD;  Location: [place];  Service: General;  Laterality: N/A;  . Splenectomy, total N/A 12/20/2013    Procedure: SPLENECTOMY;  Surgeon: [name], MD;  Location: [place];  Service: General;  Laterality: N/A;  . Partial gastrectomy N/A 12/20/2013    Procedure: PARTIAL GASTRECTOMY;  Surgeon: [name], MD;  Location: [place];  Service: General;  Laterality: N/A;  . Portacath  placement N/A 01/15/2014    Procedure: INSERTION PORT-A-CATH;  Surgeon: [name], MD;  Location: [facility];  Service: General;  Laterality: N/A;    Prior to Admission medications   Medication Sig Start Date End Date Taking? Authorizing Provider  ALPRAZolam Duanne Moron) 0.5 MG tablet Take 1 tablet (0.5 mg total) by mouth at bedtime as needed for anxiety. 01/15/15  Yes [name], MD  antiseptic oral rinse (BIOTENE) LIQD 15 mLs by Mouth Rinse route as needed for dry  mouth.   Yes Historical Provider, MD  aspirin EC 81 MG tablet Take 81 mg by mouth every morning.    Yes Historical Provider, MD  b complex vitamins tablet Take 1 tablet by mouth every morning.    Yes Historical Provider, MD  Biotin 1 MG CAPS Take 1 capsule by mouth daily at 12 noon.    Yes Historical Provider, MD  CREON 12000 UNITS CPEP capsule TAKE TWO CAPSULES THREE TIMES DAILY WITH MEALS 01/09/15  Yes Chauncey Cruel, MD  diphenhydrAMINE (BENADRYL) 25 MG tablet Take 25 mg by mouth at bedtime as needed for sleep.    Yes Historical Provider, MD  loratadine (CLARITIN) 10 MG tablet Take 10 mg by mouth daily as needed for allergies.    Yes Historical Provider, MD  Multiple Vitamins-Minerals (PRESERVISION AREDS PO) Take 1 capsule by mouth 2 (two) times daily.   Yes Historical Provider, MD  omeprazole (PRILOSEC) 40 MG capsule Take 1 capsule (40 mg total) by mouth daily. 08/15/14  Yes Chauncey Cruel, MD  ondansetron (ZOFRAN) 8 MG tablet Take 1 tablet (8 mg total) by mouth every 8 (eight) hours as needed for nausea or vomiting. 07/03/15  Yes Susanne Borders, NP  PARoxetine HCl (PAXIL PO) Take 1 tablet by mouth daily.   Yes Historical Provider, MD  PARoxetine (PAXIL) 20 MG tablet Take 2 tablets (40 mg total) by mouth daily. Patient not taking: Reported on 07/08/2015 03/23/15   Chauncey Cruel, MD    Current Facility-Administered Medications  Medication Dose Route Frequency Provider Last Rate Last Dose  . ALPRAZolam Duanne Moron) tablet 0.5 mg  0.5 mg Oral QHS PRN Excell Seltzer, MD   0.5 mg at 07/08/15 0214  . dextrose 5 % and 0.9 % NaCl with KCl 20 mEq/L infusion   Intravenous Continuous Excell Seltzer, MD 100 mL/hr at 07/08/15 1129    . enoxaparin (LOVENOX) injection 40 mg  40 mg Subcutaneous Q24H Excell Seltzer, MD   40 mg at 07/08/15 1129  . HYDROmorphone (DILAUDID) injection 1-2 mg  1-2 mg Intravenous Q2H PRN Excell Seltzer, MD   1 mg at 07/08/15 0756  . multivitamin-lutein (OCUVITE-LUTEIN)  capsule 1 capsule  1 capsule Oral Daily Md Ccs, MD   1 capsule at 07/08/15 1130  . ondansetron (ZOFRAN-ODT) disintegrating tablet 4 mg  4 mg Oral Q6H PRN Excell Seltzer, MD       Or  . ondansetron [facility]) injection 4 mg  4 mg Intravenous Q6H PRN Excell Seltzer, MD      . pantoprazole (PROTONIX) injection 40 mg  40 mg Intravenous QHS Excell Seltzer, MD   40 mg at 07/08/15 0118  . potassium chloride 10 mEq in 100 mL IVPB  10 mEq Intravenous Q1 Hr x 2 Excell Seltzer, MD   10 mEq at 07/08/15 1131  . zolpidem (AMBIEN) tablet 5 mg  5 mg Oral QHS PRN Md Ccs, MD        Allergies as of 07/31/2015 - Review Complete 07/30/2015  Allergen Reaction Noted  . Tape  06/04/2014  . Codeine Nausea And Vomiting   . Lactose intolerance (gi)  11/28/2013  . Tequin  06/15/2011  . Vancomycin Itching 10/30/2014  . Penicillins Nausea Only and Rash     Family History  Problem Relation Age of Onset  . Colon cancer Neg Hx   . Aneurysm Mother   . Stroke Mother   . Prostate cancer Father 59  . Skin cancer Brother 23    treated with interferon for 1 year  . Heart failure Paternal Aunt   . Breast cancer Paternal Aunt     dx over 82s  . Pancreatic cancer Maternal Grandmother     dx in her 42s  . Breast cancer Maternal Aunt     dx over 45  . Rheum arthritis Brother   . Breast cancer Paternal Aunt     dx over 72s  . Leukemia Paternal Aunt     Social History   Social History  . Marital Status: Married    Spouse Name: N/A  . Number of Children: 2  . Years of Education: N/A   Occupational History  . Self employed     Social History Main Topics  . Smoking status: Former Smoker -- 0.50 packs/day for 28 years    Types: Cigarettes    Quit date: 11/02/2000  . Smokeless tobacco: Never Used  . Alcohol Use: Yes     Comment: occ wine   . Drug Use: No  . Sexual Activity: Not on file   Other Topics Concern  . Not on file   Social History Narrative   2 caffeine drinks daily     Review of  Systems: As per history of present illness, otherwise negative  Physical Exam: Vital signs in last 24 hours: Temp:  [98.6 F (37 C)-99.3 F (37.4 C)] 99 F (37.2 C) (09/05 0500) Pulse Rate:  [59-93] 93 (09/05 0500) Resp:  [14-20] 16 (09/05 0500) BP: (98-120)/(42-63) 120/63 mmHg (09/05 0500) SpO2:  [91 %-98 %] 98 % (09/05 0500) Weight:  [176 lb 12.8 oz (80.196 kg)] 176 lb 12.8 oz (80.196 kg) (09/05 0018) Last BM Date: 07/16/2015 Gen: awake, alert, NAD HEENT: anicteric, op clear CV: RRR, no mrg Pulm: CTA b/l Abd: soft, distended with hypoactive but present bowel sounds, no rebound or guarding, slightly tender in the left abdomen  Ext:  no c/c/e Neuro: nonfocal    Lab Results:  Recent Labs  07/08/15 0549  WBC 14.9*  HGB 12.8  HCT 36.1  PLT 380   BMET  Recent Labs  07/13/2015 1855 07/08/15 0549  NA 134* 135  K 3.3* 3.4*  CL 99* 104  CO2 27 27  GLUCOSE 135* 150*  BUN 12 8  CREATININE 0.63 0.58  CALCIUM 8.1* 7.5*   LFT  Recent Labs  07/25/2015 1855  PROT 6.1*  ALBUMIN 3.0*  AST 16  ALT 16  ALKPHOS 75  BILITOT 0.6   Studies/Results: Ct Abdomen Pelvis W Contrast  07/20/2015 CLINICAL DATA: Upper and lower abdominal pain starting on Monday. Bloating. Diarrhea. Nausea. Pancreatic cancer diagnosed in January, 2005 2015, with prior Whipple procedure, chemotherapy, and radiation. Remote breast cancer. EXAM: CT ABDOMEN AND PELVIS WITH CONTRAST TECHNIQUE: Multidetector CT imaging of the abdomen and pelvis was performed using the standard protocol following bolus administration of intravenous contrast. CONTRAST: 172mL OMNIPAQUE IOHEXOL 300 MG/ML SOLN COMPARISON: 05/13/2015 FINDINGS: Lower chest: Trace right pleural effusion with passive atelectasis. Hepatobiliary: Steatosis primarily involving the right hepatic  lobe but with some bandlike steatosis along parts of the left hepatic lobe. Hyper enhancement in the left hepatic lobe, chronic, likely due to the  diminutive left portal vein causing delayed drainage. In the dome a segment 8, a small enhancing lesion is stable and likely a perfusion anomaly. Gallbladder absent. Pancreas: Clips are noted in the expected location of the pancreas were there is a band of fibrosis or tissue along the expected spot of the pancreatic tail and body. No increase in the volume of soft tissue in this vicinity, which was previously not significantly hypermetabolic on PET-CT. Pancreatic head unremarkable. Spleen: Splenectomy Adrenals/Urinary Tract: Unremarkable Stomach/Bowel: Partial gastrectomy. Along the gastrectomy staple line in the left upper quadrant, there is an angled loop of splenic flexure of the colon marking a transition point between dilated and nondilated colon. The distal small bowel is also dilated. Appearance compatible with colonic obstruction due to adhesion. There is wall thickening in the colon leading up to the transition point, as well as wall thickening in the distal ileum and scattered air-fluid levels. Interestingly there are some air- fluid levels in the nondilated distal colon as well, particularly in the sigmoid. Vascular/Lymphatic: Aortoiliac atherosclerotic vascular disease. The celiac trunk appears stenotic. Reproductive: Unremarkable Other: Mesenteric edema along the pelvic small bowel loops, as on image 63 series 2. Musculoskeletal: Supraumbilical ventral hernia contains adipose tissue. IMPRESSION: 1. Bowel obstruction at the level of the splenic flexure the colon due to an adhesion along the adjacent partial gastrectomy staple line. At this point there is a transition between dilated and thick-walled colon and the nondilated distal colon. There are air-fluid levels diffusely in the colon and small bowel, and dilation of the distal small bowel, with edema in the mesentery of several small bowel loops. 2. No specific findings of pancreatic cancer recurrence. 3. Trace right pleural effusion. 4.  Geographic hepatic steatosis. Hyper enhancement of the left hepatic lobe due to left portal vein diminutive size. Electronically Signed By: Van Clines M.D. On: 07/06/2015 21:42    IMPRESSION:  69 y.o. female with history of pancreatic adenocarcinoma status post laparoscopic cholecystectomy, open ended distal pancreatectomy, splenectomy, and partial gastrectomy in February 2015, also status post chemotherapy and radiation admitted to the surgical service for abdominal pain and distention and nausea found to have a high-grade colonic obstruction near the splenic flexure.  PLAN:  1. Colonic obstruction -- clinically improved with bowel rest though remains somewhat distended. I do not think she could tolerate a full colonoscopy prep by mouth. Discussed with Dr. Excell Seltzer, images reviewed. Difficult to know whether this obstruction is secondary to benign process such as adhesion causing a kink in the colon and thus obstruction or could this possibly be recurrence of malignancy at this location. Reasonable to attempt flexible sigmoidoscopy to evaluate this area and determine degree of obstruction. Unsure if this area will be able to be traversed endoscopically or not. Stent placement may or may not be possible depending on whether the obstruction can be crossed and also anatomy in this location. Stent would carry risk of perforation and migration. Surgery anticipated either way, but if stenosis can be traversed may allow for complete bowel preparation and potentially avoid the need for ostomy. --Tapwater enemas today and in the morning --Flexible sigmoidoscopy tomorrow with Dr. Ardis Hughs --Discussed with patient and her daughter.  The nature of the procedure, as well as the risks, benefits, and alternatives were carefully and thoroughly reviewed with the patient. Ample time for discussion and questions allowed. The  patient understood, was satisfied, and agreed to proceed.      Ayuub Penley, [place]   07/08/2015, 1:29 PM

## 2015-07-08 NOTE — Progress Notes (Signed)
Patient ID: Annette Beltran, female   DOB: Dec 13, 1945, 69 y.o.   MRN: [id number]    Subjective: Had one large loose bowel movement last night. Feels better this morning with this and pain medication.  Objective: Vital signs in last 24 hours: Temp:  [98.6 F (37 C)-99.3 F (37.4 C)] 99 F (37.2 C) (09/05 0500) Pulse Rate:  [59-93] 93 (09/05 0500) Resp:  [14-20] 16 (09/05 0500) BP: (98-120)/(42-63) 120/63 mmHg (09/05 0500) SpO2:  [91 %-98 %] 98 % (09/05 0500) Weight:  [80.196 kg (176 lb 12.8 oz)] 80.196 kg (176 lb 12.8 oz) (09/05 0018) Last BM Date: 07/30/2015  Intake/Output from previous day: 09/04 0701 - 09/05 0700 In: -  Out: 1 [Urine:1] Intake/Output this shift:    General appearance: alert, cooperative and no distress GI: somewhat distended but less then yesterday. Soft with no significant tenderness or guarding  Lab Results:   Recent Labs  07/08/15 0549  WBC 14.9*  HGB 12.8  HCT 36.1  PLT 380   BMET  Recent Labs  07/05/2015 1855 07/08/15 0549  NA 134* 135  K 3.3* 3.4*  CL 99* 104  CO2 27 27  GLUCOSE 135* 150*  BUN 12 8  CREATININE 0.63 0.58  CALCIUM 8.1* 7.5*     Studies/Results: Ct Abdomen Pelvis W Contrast  07/30/2015   CLINICAL DATA:  Upper and lower abdominal pain starting on Monday. Bloating. Diarrhea. Nausea. Pancreatic cancer diagnosed in January, 2005 2015, with prior Whipple procedure, chemotherapy, and radiation. Remote breast cancer.  EXAM: CT ABDOMEN AND PELVIS WITH CONTRAST  TECHNIQUE: Multidetector CT imaging of the abdomen and pelvis was performed using the standard protocol following bolus administration of intravenous contrast.  CONTRAST:  129mL OMNIPAQUE IOHEXOL 300 MG/ML  SOLN  COMPARISON:  05/13/2015  FINDINGS: Lower chest:  Trace right pleural effusion with passive atelectasis.  Hepatobiliary: Steatosis primarily involving the right hepatic lobe but with some bandlike steatosis along parts of the left hepatic lobe. Hyper enhancement in the  left hepatic lobe, chronic, likely due to the diminutive left portal vein causing delayed drainage.  In the dome a segment 8, a small enhancing lesion is stable and likely a perfusion anomaly. Gallbladder absent.  Pancreas: Clips are noted in the expected location of the pancreas were there is a band of fibrosis or tissue along the expected spot of the pancreatic tail and body. No increase in the volume of soft tissue in this vicinity, which was previously not significantly hypermetabolic on PET-CT. Pancreatic head unremarkable.  Spleen: Splenectomy  Adrenals/Urinary Tract: Unremarkable  Stomach/Bowel: Partial gastrectomy. Along the gastrectomy staple line in the left upper quadrant, there is an angled loop of splenic flexure of the colon marking a transition point between dilated and nondilated colon. The distal small bowel is also dilated. Appearance compatible with colonic obstruction due to adhesion. There is wall thickening in the colon leading up to the transition point, as well as wall thickening in the distal ileum and scattered air-fluid levels. Interestingly there are some air- fluid levels in the nondilated distal colon as well, particularly in the sigmoid.  Vascular/Lymphatic: Aortoiliac atherosclerotic vascular disease. The celiac trunk appears stenotic.  Reproductive: Unremarkable  Other: Mesenteric edema along the pelvic small bowel loops, as on image 63 series 2.  Musculoskeletal: Supraumbilical ventral hernia contains adipose tissue.  IMPRESSION: 1. Bowel obstruction at the level of the splenic flexure the colon due to an adhesion along the adjacent partial gastrectomy staple line. At this point there is a  transition between dilated and thick-walled colon and the nondilated distal colon. There are air-fluid levels diffusely in the colon and small bowel, and dilation of the distal small bowel, with edema in the mesentery of several small bowel loops. 2. No specific findings of pancreatic cancer  recurrence. 3. Trace right pleural effusion. 4. Geographic hepatic steatosis. Hyper enhancement of the left hepatic lobe due to left portal vein diminutive size.   Electronically Signed   By: Van Clines M.D.   On: 07/20/2015 21:42    Anti-infectives: Anti-infectives    None      Assessment/Plan: High-grade obstruction splenic flexure of colon with history of chemoradiation and surgery for cancer of the body of the pancreas-distal pancreatectomy/splenectomy and partial gastrectomy one year ago. Rising CA-19-9 with recent negative PET/CT CT indicates kinking or adhesion to the gastric pouch although certainly postradiation scarring or tumor are possibilities. She seems better today on bowel rest. Likely will require surgical intervention. If there is any chance to decompress or get her prepped preoperatively this would be preferable and could possibly avoid a colostomy. I discussed this with Dr. Hilarie Fredrickson who will evaluate the patient and consider for flexible sigmoidoscopy tomorrow after enemas today.    LOS: 1 day    Trinnity Breunig T 07/08/2015

## 2015-07-08 NOTE — Progress Notes (Signed)
Pt arrived to unit room 1513 via stretcher. A&OX4, pain 2/10. VS taken, pt oriented to room and callbell with no complaints. Pt guide at the bedside. Initial assessment completed, will continue to monitor pt throughout shift. Family at the bedside

## 2015-07-09 ENCOUNTER — Encounter (HOSPITAL_COMMUNITY): Admission: EM | Disposition: E | Payer: Self-pay | Source: Home / Self Care | Attending: General Surgery

## 2015-07-09 ENCOUNTER — Inpatient Hospital Stay (HOSPITAL_COMMUNITY): Payer: Medicare Other

## 2015-07-09 ENCOUNTER — Encounter (HOSPITAL_COMMUNITY): Payer: Self-pay | Admitting: *Deleted

## 2015-07-09 DIAGNOSIS — K639 Disease of intestine, unspecified: Secondary | ICD-10-CM

## 2015-07-09 HISTORY — PX: FLEXIBLE SIGMOIDOSCOPY: SHX5431

## 2015-07-09 LAB — CBC
HCT: 35.7 % — ABNORMAL LOW (ref 36.0–46.0)
Hemoglobin: 13 g/dL (ref 12.0–15.0)
MCH: 32.2 pg (ref 26.0–34.0)
MCHC: 36.4 g/dL — AB (ref 30.0–36.0)
MCV: 88.4 fL (ref 78.0–100.0)
PLATELETS: 409 10*3/uL — AB (ref 150–400)
RBC: 4.04 MIL/uL (ref 3.87–5.11)
RDW: 13 % (ref 11.5–15.5)
WBC: 10.1 10*3/uL (ref 4.0–10.5)

## 2015-07-09 LAB — BASIC METABOLIC PANEL
Anion gap: 6 (ref 5–15)
BUN: 6 mg/dL (ref 6–20)
CALCIUM: 7.7 mg/dL — AB (ref 8.9–10.3)
CO2: 26 mmol/L (ref 22–32)
CREATININE: 0.5 mg/dL (ref 0.44–1.00)
Chloride: 106 mmol/L (ref 101–111)
Glucose, Bld: 116 mg/dL — ABNORMAL HIGH (ref 65–99)
Potassium: 3.2 mmol/L — ABNORMAL LOW (ref 3.5–5.1)
SODIUM: 138 mmol/L (ref 135–145)

## 2015-07-09 SURGERY — SIGMOIDOSCOPY, FLEXIBLE
Anesthesia: Monitor Anesthesia Care

## 2015-07-09 MED ORDER — OCUVITE PO TABS
1.0000 | ORAL_TABLET | Freq: Every day | ORAL | Status: DC
  Administered 2015-07-10: 1 via ORAL
  Filled 2015-07-09 (×3): qty 1

## 2015-07-09 MED ORDER — DIPHENHYDRAMINE HCL 50 MG/ML IJ SOLN: INTRAMUSCULAR | Status: DC | PRN

## 2015-07-09 MED ORDER — POTASSIUM CHLORIDE 10 MEQ/100ML IV SOLN
10.0000 meq | INTRAVENOUS | Status: AC
  Administered 2015-07-09 (×6): 10 meq via INTRAVENOUS
  Filled 2015-07-09 (×6): qty 100

## 2015-07-09 MED ORDER — MIDAZOLAM HCL 5 MG/ML IJ SOLN
INTRAMUSCULAR | Status: AC
  Filled 2015-07-09: qty 2

## 2015-07-09 MED ORDER — GADOBENATE DIMEGLUMINE 529 MG/ML IV SOLN
18.0000 mL | Freq: Once | INTRAVENOUS | Status: AC | PRN
  Administered 2015-07-09: 18 mL via INTRAVENOUS

## 2015-07-09 MED ORDER — FENTANYL CITRATE (PF) 100 MCG/2ML IJ SOLN
INTRAMUSCULAR | Status: AC
  Filled 2015-07-09: qty 2

## 2015-07-09 MED ORDER — SODIUM CHLORIDE 0.9 % IV SOLN
INTRAVENOUS | Status: DC
Start: 2015-07-09 — End: 2015-07-09

## 2015-07-09 MED ORDER — FENTANYL CITRATE (PF) 100 MCG/2ML IJ SOLN
INTRAMUSCULAR | Status: DC | PRN
  Administered 2015-07-09 (×3): 25 ug via INTRAVENOUS

## 2015-07-09 MED ORDER — MIDAZOLAM HCL 10 MG/2ML IJ SOLN
INTRAMUSCULAR | Status: DC | PRN
  Administered 2015-07-09 (×3): 2 mg via INTRAVENOUS

## 2015-07-09 NOTE — Progress Notes (Signed)
Pt given tap water enema, tolerated well with good results

## 2015-07-09 NOTE — Telephone Encounter (Signed)
Late addendum: Pt called stating that she has chronic diarrhea. She has only been taking 1 imodium tab at a time for tx of her diarrhea.  She denies any nausea or vomiting. Also denies any fever or chills.    She currently denies any abd pain as well.   Advised that pt try takign 2 Imodium tabs at a time to see if that helped.   Advised patient to call/return or go directly to the emergency department over the weekend if she develops any worsening symptoms whatsoever.  Patient stated understanding of all instructions; and was in agreement with this plan of care.

## 2015-07-09 NOTE — Progress Notes (Signed)
Patient ID: Annette Beltran, female   DOB: August 13, 1946, 69 y.o.   MRN: [id number] Day of Surgery  Subjective: Pt denies nausea, but is having no flatus.    Objective: Vital signs in last 24 hours: Temp:  [97.9 F (36.6 C)-98.9 F (37.2 C)] 97.9 F (36.6 C) (09/06 0826) Pulse Rate:  [60-91] 60 (09/06 0700) Resp:  [15-18] 15 (09/06 0826) BP: (109-130)/(48-72) 130/48 mmHg (09/06 0826) SpO2:  [89 %-98 %] 97 % (09/06 0826) Last BM Date: 07/08/15  Intake/Output from previous day: 09/05 0701 - 09/06 0700 In: 1200 [I.V.:1200] Out: -  Intake/Output this shift:    General appearance: alert, cooperative and no distress GI: somewhat distended. Soft with no significant tenderness or guarding  Lab Results:   Recent Labs  07/08/15 0549 07/06/2015 0539  WBC 14.9* 10.1  HGB 12.8 13.0  HCT 36.1 35.7*  PLT 380 409*   BMET  Recent Labs  07/08/15 0549 07/20/2015 0539  NA 135 138  K 3.4* 3.2*  CL 104 106  CO2 27 26  GLUCOSE 150* 116*  BUN 8 6  CREATININE 0.58 0.50  CALCIUM 7.5* 7.7*     Studies/Results: Ct Abdomen Pelvis W Contrast  07/12/2015   CLINICAL DATA:  Upper and lower abdominal pain starting on Monday. Bloating. Diarrhea. Nausea. Pancreatic cancer diagnosed in January, 2005 2015, with prior Whipple procedure, chemotherapy, and radiation. Remote breast cancer.  EXAM: CT ABDOMEN AND PELVIS WITH CONTRAST  TECHNIQUE: Multidetector CT imaging of the abdomen and pelvis was performed using the standard protocol following bolus administration of intravenous contrast.  CONTRAST:  175mL OMNIPAQUE IOHEXOL 300 MG/ML  SOLN  COMPARISON:  05/13/2015  FINDINGS: Lower chest:  Trace right pleural effusion with passive atelectasis.  Hepatobiliary: Steatosis primarily involving the right hepatic lobe but with some bandlike steatosis along parts of the left hepatic lobe. Hyper enhancement in the left hepatic lobe, chronic, likely due to the diminutive left portal vein causing delayed drainage.  In the  dome a segment 8, a small enhancing lesion is stable and likely a perfusion anomaly. Gallbladder absent.  Pancreas: Clips are noted in the expected location of the pancreas were there is a band of fibrosis or tissue along the expected spot of the pancreatic tail and body. No increase in the volume of soft tissue in this vicinity, which was previously not significantly hypermetabolic on PET-CT. Pancreatic head unremarkable.  Spleen: Splenectomy  Adrenals/Urinary Tract: Unremarkable  Stomach/Bowel: Partial gastrectomy. Along the gastrectomy staple line in the left upper quadrant, there is an angled loop of splenic flexure of the colon marking a transition point between dilated and nondilated colon. The distal small bowel is also dilated. Appearance compatible with colonic obstruction due to adhesion. There is wall thickening in the colon leading up to the transition point, as well as wall thickening in the distal ileum and scattered air-fluid levels. Interestingly there are some air- fluid levels in the nondilated distal colon as well, particularly in the sigmoid.  Vascular/Lymphatic: Aortoiliac atherosclerotic vascular disease. The celiac trunk appears stenotic.  Reproductive: Unremarkable  Other: Mesenteric edema along the pelvic small bowel loops, as on image 63 series 2.  Musculoskeletal: Supraumbilical ventral hernia contains adipose tissue.  IMPRESSION: 1. Bowel obstruction at the level of the splenic flexure the colon due to an adhesion along the adjacent partial gastrectomy staple line. At this point there is a transition between dilated and thick-walled colon and the nondilated distal colon. There are air-fluid levels diffusely in the colon and  small bowel, and dilation of the distal small bowel, with edema in the mesentery of several small bowel loops. 2. No specific findings of pancreatic cancer recurrence. 3. Trace right pleural effusion. 4. Geographic hepatic steatosis. Hyper enhancement of the left  hepatic lobe due to left portal vein diminutive size.   Electronically Signed   By: Van Clines M.D.   On: 07/06/2015 21:42   Dg Abd 2 Views  07/08/2015   CLINICAL DATA:  Followup colonic obstruction. Status post Whipple procedure for pancreatic cancer in 2015 with splenectomy and partial gastric resection.  EXAM: ABDOMEN - 2 VIEW  COMPARISON:  Abdomen and pelvis CT obtained yesterday.  FINDINGS: Again demonstrated is dilatation of the transverse colon to the level of the splenic flexure. This appears slightly improved. There is also slightly less dilatation of small bowel loops. Multiple air-fluid levels on the upright view in the colon and small bowel. No free peritoneal air. Excreted contrast in the urinary bladder. Upper abdominal surgical clips and staples. Small bilateral pleural effusions. Probable atelectasis at both lung bases.  IMPRESSION: 1. Slightly improved pattern of colonic obstruction at the level of the splenic flexure. 2. Small bilateral pleural effusions. 3. Mild bibasilar atelectasis.   Electronically Signed   By: Claudie Revering M.D.   On: 07/08/2015 11:09    Anti-infectives: Anti-infectives    None      Assessment/Plan: High-grade obstruction splenic flexure of colon with history of chemoradiation and surgery for cancer of the body of the pancreas-distal pancreatectomy/splenectomy and partial gastrectomy one year ago. Rising CA-19-9 with recent negative PET/CT CT indicates kinking or adhesion to the gastric pouch although certainly postradiation scarring or tumor are possibilities. She seems better today on bowel rest. Getting endoscopy today. Will also order MRI which was set up as outpt to evaluate for occult metastatic disease.   If operation indicated, would plan to do on Thursday if OR available.    LOS: 2 days    [facility] 07/22/2015

## 2015-07-09 NOTE — H&P (View-Only) (Signed)
Consultation  Referring Provider: Dr. Excell Seltzer Primary Care Physician:  [name], MD Primary Gastroenterologist:  Dr. Olevia Perches, has also seen Dr. Ardis Hughs  Reason for Consultation:  Colonic obstruction  HPI: Annette Beltran is a 69 y.o. female with history of pancreatic adenocarcinoma status post laparoscopic cholecystectomy, open ended distal pancreatectomy, splenectomy, and partial gastrectomy in February 2015, also status post chemotherapy and radiation admitted to the surgical service for abdominal pain and distention and nausea found to have a high-grade colonic obstruction near the splenic flexure. She reports she was doing well until about a week ago when she developed episodic abdominal pain. She noticed increasing abdominal distention. Pain was crampy and at times sharp. She had associated nausea as well as dry heaves but never true vomiting. She been receiving fluids and anti-emetics by oncology. Pain and distention became more severe and she presented to the ER.  She has been followed by oncology and has had a rising CA-19-9 though no evidence of tumor recurrence by PET scan.  Cross-sectional imaging was performed which showed colonic obstruction in the left upper quadrant possibly related to adhesive disease or even kinking related to gastrectomy staple line closest to the splenic flexure of the colon. There is no obvious recurrence of tumor by recent imaging. Dr. Excell Seltzer feels the patient will require surgery but asks for our help to evaluate the area endoscopically if possible to determine if this appears to be benign or malignant and possibly stent if possible. Being able to cross the obstruction would allow for more adequate bowel prepping and possibly primary anastomosis versus colostomy.  Patient has been nothing by mouth. Did have a large bowel movement which was loose last night. No obvious blood. Feeling some better this morning. Denies pain currently.   Past Medical  History  Diagnosis Date  . Macular degeneration   . Fluid retention   . Colitis, collagenous   . Vitamin D deficiency   . Osteopenia   . Celiac artery stenosis   . Splenic infarction   . Arthritis   . History of blood clots   . Anxiety   . Shingles   . Pancreatitis   . Osteoporosis   . Clotting disorder   . Arrhythmia     "skips a beat"  [place]  heart  . GERD (gastroesophageal reflux disease)   . Breast cancer     rt lumpectomy    Past Surgical History  Procedure Laterality Date  . Foot surgery  2003    x 3, 2 on right, 1 on left  . Breast lumpectomy Right     radiation for 6 weeks  . Meniscus repair Right   . Eus N/A 11/23/2013    Procedure: UPPER ENDOSCOPIC ULTRASOUND (EUS) LINEAR;  Surgeon: Milus Banister, MD;  Location: [facility];  Service: Endoscopy;  Laterality: N/A;  . Tonsillectomy    . Laparoscopy N/A 12/20/2013    Procedure: LAPAROSCOPY DIAGNOSTIC ;  Surgeon: [name], MD;  Location: [facility];  Service: General;  Laterality: N/A;  . Cholecystectomy N/A 12/20/2013    Procedure: LAPAROSCOPIC CHOLECYSTECTOMY;  Surgeon: [name], MD;  Location: [facility];  Service: General;  Laterality: N/A;  . Splenectomy, total N/A 12/20/2013    Procedure: SPLENECTOMY;  Surgeon: [name], MD;  Location: [facility];  Service: General;  Laterality: N/A;  . Partial gastrectomy N/A 12/20/2013    Procedure: PARTIAL GASTRECTOMY;  Surgeon: [name], MD;  Location: [facility];  Service: General;  Laterality: N/A;  . Portacath  placement N/A 01/15/2014    Procedure: INSERTION PORT-A-CATH;  Surgeon: [name], MD;  Location: [facility];  Service: General;  Laterality: N/A;    Prior to Admission medications   Medication Sig Start Date End Date Taking? Authorizing Provider  ALPRAZolam Duanne Moron) 0.5 MG tablet Take 1 tablet (0.5 mg total) by mouth at bedtime as needed for anxiety. 01/15/15  Yes [name], MD  antiseptic oral rinse (BIOTENE) LIQD 15 mLs by Mouth Rinse route as needed for dry  mouth.   Yes Historical Provider, MD  aspirin EC 81 MG tablet Take 81 mg by mouth every morning.    Yes Historical Provider, MD  b complex vitamins tablet Take 1 tablet by mouth every morning.    Yes Historical Provider, MD  Biotin 1 MG CAPS Take 1 capsule by mouth daily at 12 noon.    Yes Historical Provider, MD  CREON 12000 UNITS CPEP capsule TAKE TWO CAPSULES THREE TIMES DAILY WITH MEALS 01/09/15  Yes Chauncey Cruel, MD  diphenhydrAMINE (BENADRYL) 25 MG tablet Take 25 mg by mouth at bedtime as needed for sleep.    Yes Historical Provider, MD  loratadine (CLARITIN) 10 MG tablet Take 10 mg by mouth daily as needed for allergies.    Yes Historical Provider, MD  Multiple Vitamins-Minerals (PRESERVISION AREDS PO) Take 1 capsule by mouth 2 (two) times daily.   Yes Historical Provider, MD  omeprazole (PRILOSEC) 40 MG capsule Take 1 capsule (40 mg total) by mouth daily. 08/15/14  Yes Chauncey Cruel, MD  ondansetron (ZOFRAN) 8 MG tablet Take 1 tablet (8 mg total) by mouth every 8 (eight) hours as needed for nausea or vomiting. 07/03/15  Yes Susanne Borders, NP  PARoxetine HCl (PAXIL PO) Take 1 tablet by mouth daily.   Yes Historical Provider, MD  PARoxetine (PAXIL) 20 MG tablet Take 2 tablets (40 mg total) by mouth daily. Patient not taking: Reported on 07/22/2015 03/23/15   Chauncey Cruel, MD    Current Facility-Administered Medications  Medication Dose Route Frequency Provider Last Rate Last Dose  . ALPRAZolam Duanne Moron) tablet 0.5 mg  0.5 mg Oral QHS PRN Excell Seltzer, MD   0.5 mg at 07/08/15 0214  . dextrose 5 % and 0.9 % NaCl with KCl 20 mEq/L infusion   Intravenous Continuous Excell Seltzer, MD 100 mL/hr at 07/08/15 1129    . enoxaparin (LOVENOX) injection 40 mg  40 mg Subcutaneous Q24H Excell Seltzer, MD   40 mg at 07/08/15 1129  . HYDROmorphone (DILAUDID) injection 1-2 mg  1-2 mg Intravenous Q2H PRN Excell Seltzer, MD   1 mg at 07/08/15 0756  . multivitamin-lutein (OCUVITE-LUTEIN)  capsule 1 capsule  1 capsule Oral Daily Md Ccs, MD   1 capsule at 07/08/15 1130  . ondansetron (ZOFRAN-ODT) disintegrating tablet 4 mg  4 mg Oral Q6H PRN Excell Seltzer, MD       Or  . ondansetron [facility]) injection 4 mg  4 mg Intravenous Q6H PRN Excell Seltzer, MD      . pantoprazole (PROTONIX) injection 40 mg  40 mg Intravenous QHS Excell Seltzer, MD   40 mg at 07/08/15 0118  . potassium chloride 10 mEq in 100 mL IVPB  10 mEq Intravenous Q1 Hr x 2 Excell Seltzer, MD   10 mEq at 07/08/15 1131  . zolpidem (AMBIEN) tablet 5 mg  5 mg Oral QHS PRN Md Ccs, MD        Allergies as of 07/15/2015 - Review Complete 07/10/2015  Allergen Reaction Noted  . Tape  06/04/2014  . Codeine Nausea And Vomiting   . Lactose intolerance (gi)  11/28/2013  . Tequin  06/15/2011  . Vancomycin Itching 10/30/2014  . Penicillins Nausea Only and Rash     Family History  Problem Relation Age of Onset  . Colon cancer Neg Hx   . Aneurysm Mother   . Stroke Mother   . Prostate cancer Father 89  . Skin cancer Brother 89    treated with interferon for 1 year  . Heart failure Paternal Aunt   . Breast cancer Paternal Aunt     dx over 64s  . Pancreatic cancer Maternal Grandmother     dx in her 105s  . Breast cancer Maternal Aunt     dx over 60  . Rheum arthritis Brother   . Breast cancer Paternal Aunt     dx over 80s  . Leukemia Paternal Aunt     Social History   Social History  . Marital Status: Married    Spouse Name: N/A  . Number of Children: 2  . Years of Education: N/A   Occupational History  . Self employed     Social History Main Topics  . Smoking status: Former Smoker -- 0.50 packs/day for 28 years    Types: Cigarettes    Quit date: 11/02/2000  . Smokeless tobacco: Never Used  . Alcohol Use: Yes     Comment: occ wine   . Drug Use: No  . Sexual Activity: Not on file   Other Topics Concern  . Not on file   Social History Narrative   2 caffeine drinks daily     Review of  Systems: As per history of present illness, otherwise negative  Physical Exam: Vital signs in last 24 hours: Temp:  [98.6 F (37 C)-99.3 F (37.4 C)] 99 F (37.2 C) (09/05 0500) Pulse Rate:  [59-93] 93 (09/05 0500) Resp:  [14-20] 16 (09/05 0500) BP: (98-120)/(42-63) 120/63 mmHg (09/05 0500) SpO2:  [91 %-98 %] 98 % (09/05 0500) Weight:  [176 lb 12.8 oz (80.196 kg)] 176 lb 12.8 oz (80.196 kg) (09/05 0018) Last BM Date: 07/31/2015 Gen: awake, alert, NAD HEENT: anicteric, op clear CV: RRR, no mrg Pulm: CTA b/l Abd: soft, distended with hypoactive but present bowel sounds, no rebound or guarding, slightly tender in the left abdomen  Ext:  no c/c/e Neuro: nonfocal    Lab Results:  Recent Labs  07/08/15 0549  WBC 14.9*  HGB 12.8  HCT 36.1  PLT 380   BMET  Recent Labs  07/30/2015 1855 07/08/15 0549  NA 134* 135  K 3.3* 3.4*  CL 99* 104  CO2 27 27  GLUCOSE 135* 150*  BUN 12 8  CREATININE 0.63 0.58  CALCIUM 8.1* 7.5*   LFT  Recent Labs  07/25/2015 1855  PROT 6.1*  ALBUMIN 3.0*  AST 16  ALT 16  ALKPHOS 75  BILITOT 0.6   Studies/Results: Ct Abdomen Pelvis W Contrast  07/27/2015 CLINICAL DATA: Upper and lower abdominal pain starting on Monday. Bloating. Diarrhea. Nausea. Pancreatic cancer diagnosed in January, 2005 2015, with prior Whipple procedure, chemotherapy, and radiation. Remote breast cancer. EXAM: CT ABDOMEN AND PELVIS WITH CONTRAST TECHNIQUE: Multidetector CT imaging of the abdomen and pelvis was performed using the standard protocol following bolus administration of intravenous contrast. CONTRAST: 142mL OMNIPAQUE IOHEXOL 300 MG/ML SOLN COMPARISON: 05/13/2015 FINDINGS: Lower chest: Trace right pleural effusion with passive atelectasis. Hepatobiliary: Steatosis primarily involving the right hepatic  lobe but with some bandlike steatosis along parts of the left hepatic lobe. Hyper enhancement in the left hepatic lobe, chronic, likely due to the  diminutive left portal vein causing delayed drainage. In the dome a segment 8, a small enhancing lesion is stable and likely a perfusion anomaly. Gallbladder absent. Pancreas: Clips are noted in the expected location of the pancreas were there is a band of fibrosis or tissue along the expected spot of the pancreatic tail and body. No increase in the volume of soft tissue in this vicinity, which was previously not significantly hypermetabolic on PET-CT. Pancreatic head unremarkable. Spleen: Splenectomy Adrenals/Urinary Tract: Unremarkable Stomach/Bowel: Partial gastrectomy. Along the gastrectomy staple line in the left upper quadrant, there is an angled loop of splenic flexure of the colon marking a transition point between dilated and nondilated colon. The distal small bowel is also dilated. Appearance compatible with colonic obstruction due to adhesion. There is wall thickening in the colon leading up to the transition point, as well as wall thickening in the distal ileum and scattered air-fluid levels. Interestingly there are some air- fluid levels in the nondilated distal colon as well, particularly in the sigmoid. Vascular/Lymphatic: Aortoiliac atherosclerotic vascular disease. The celiac trunk appears stenotic. Reproductive: Unremarkable Other: Mesenteric edema along the pelvic small bowel loops, as on image 63 series 2. Musculoskeletal: Supraumbilical ventral hernia contains adipose tissue. IMPRESSION: 1. Bowel obstruction at the level of the splenic flexure the colon due to an adhesion along the adjacent partial gastrectomy staple line. At this point there is a transition between dilated and thick-walled colon and the nondilated distal colon. There are air-fluid levels diffusely in the colon and small bowel, and dilation of the distal small bowel, with edema in the mesentery of several small bowel loops. 2. No specific findings of pancreatic cancer recurrence. 3. Trace right pleural effusion. 4.  Geographic hepatic steatosis. Hyper enhancement of the left hepatic lobe due to left portal vein diminutive size. Electronically Signed By: Van Clines M.D. On: 07/19/2015 21:42    IMPRESSION:  69 y.o. female with history of pancreatic adenocarcinoma status post laparoscopic cholecystectomy, open ended distal pancreatectomy, splenectomy, and partial gastrectomy in February 2015, also status post chemotherapy and radiation admitted to the surgical service for abdominal pain and distention and nausea found to have a high-grade colonic obstruction near the splenic flexure.  PLAN:  1. Colonic obstruction -- clinically improved with bowel rest though remains somewhat distended. I do not think she could tolerate a full colonoscopy prep by mouth. Discussed with Dr. Excell Seltzer, images reviewed. Difficult to know whether this obstruction is secondary to benign process such as adhesion causing a kink in the colon and thus obstruction or could this possibly be recurrence of malignancy at this location. Reasonable to attempt flexible sigmoidoscopy to evaluate this area and determine degree of obstruction. Unsure if this area will be able to be traversed endoscopically or not. Stent placement may or may not be possible depending on whether the obstruction can be crossed and also anatomy in this location. Stent would carry risk of perforation and migration. Surgery anticipated either way, but if stenosis can be traversed may allow for complete bowel preparation and potentially avoid the need for ostomy. --Tapwater enemas today and in the morning --Flexible sigmoidoscopy tomorrow with Dr. Ardis Hughs --Discussed with patient and her daughter.  The nature of the procedure, as well as the risks, benefits, and alternatives were carefully and thoroughly reviewed with the patient. Ample time for discussion and questions allowed. The  patient understood, was satisfied, and agreed to proceed.      Isaias Dowson, [place]   07/08/2015, 1:29 PM

## 2015-07-09 NOTE — Interval H&P Note (Signed)
History and Physical Interval Note:  07/12/2015 9:01 AM  Annette Beltran  has presented today for surgery, with the diagnosis of colonic obstruction at splenic flexure  The various methods of treatment have been discussed with the patient and family. After consideration of risks, benefits and other options for treatment, the patient has consented to  Procedure(s): FLEXIBLE SIGMOIDOSCOPY (N/A) as a surgical intervention .  The patient's history has been reviewed, patient examined, no change in status, stable for surgery.  I have reviewed the patient's chart and labs.  Questions were answered to the patient's satisfaction.     Milus Banister

## 2015-07-09 NOTE — Op Note (Signed)
[facility] [place], [address]   FLEX SIGMOIDOSCOPY PROCEDURE REPORT  PATIENT: Annette Beltran, Annette Beltran  MR#: [id number] BIRTHDATE: Apr 23, 1946 , 69  yrs. old GENDER: female ENDOSCOPIST: Milus Banister, MD PROCEDURE DATE:  07/20/2015 PROCEDURE:   Sigmoidoscopy with biopsy INDICATIONS:tail of pancreas adenocarcinoma resected 2015 (+ margins, negative nodes, + perineural invasion) with splenectomy; now with colonic obstruction near surgical site, CA 19-9 >2,000. MEDICATIONS: Fentanyl 75 mcg IV and Versed 6 mg IV  DESCRIPTION OF PROCEDURE:    Physical exam was performed.  Informed consent was obtained from the patient after explaining the benefits, risks, and alternatives to procedure.  The patient was connected to monitor and placed in left lateral position. Continuous oxygen was provided by nasal cannula and IV medicine administered through an indwelling cannula.  After administration of sedation and rectal exam, the patients rectum was intubated and the Pentax Egd Scope  colonoscope was advanced under direct visualization to the cecum.  The scope was removed slowly by carefully examining the color, texture, anatomy, and integrity mucosa on the way out.  The patient was recovered in endoscopy and discharged home in satisfactory condition. Estimated blood loss is zero unless otherwise noted in this procedure report.   COLON FINDINGS: At 100cm from anus (per colonoscope measurment) there was a tight colonic stricture which I was not able to pass with the colonoscope).  I presume this is the site near the splenic flexure on CT scan.  The lumen was very edematous, slightly firm but was not overtly neoplastic at the distal end of the stricture. I biopsied the site extensively.  The examination was otherwise normal. PREP QUALITY: The overall prep quality was adequate. COMPLICATIONS: None  ENDOSCOPIC IMPRESSION: At 100cm from anus (per colonoscope measurment)  there was a tight colonic stricture which I was not able to pass with the colonoscope).  I presume this is the site near the splenic flexure on CT scan.  The lumen was very edematous, slightly firm but was not overtly neoplastic at the distal end of the stricture.  I biopsied the site extensively.  The examination was otherwise normal  RECOMMENDATIONS: Await final pathology.  She will require surgical resection, diversion.   _______________________________ eSignedMilus Banister, MD 07/31/2015 9:55 AM

## 2015-07-10 ENCOUNTER — Inpatient Hospital Stay (HOSPITAL_COMMUNITY): Payer: Medicare Other

## 2015-07-10 ENCOUNTER — Encounter (HOSPITAL_COMMUNITY): Payer: Self-pay | Admitting: Gastroenterology

## 2015-07-10 LAB — CBC
HEMATOCRIT: 34.9 % — AB (ref 36.0–46.0)
HEMOGLOBIN: 12.2 g/dL (ref 12.0–15.0)
MCH: 31.7 pg (ref 26.0–34.0)
MCHC: 35 g/dL (ref 30.0–36.0)
MCV: 90.6 fL (ref 78.0–100.0)
Platelets: 440 10*3/uL — ABNORMAL HIGH (ref 150–400)
RBC: 3.85 MIL/uL — ABNORMAL LOW (ref 3.87–5.11)
RDW: 13.4 % (ref 11.5–15.5)
WBC: 9 10*3/uL (ref 4.0–10.5)

## 2015-07-10 LAB — BASIC METABOLIC PANEL
ANION GAP: 9 (ref 5–15)
BUN: <5 mg/dL — ABNORMAL LOW (ref 6–20)
CALCIUM: 8 mg/dL — AB (ref 8.9–10.3)
CO2: 24 mmol/L (ref 22–32)
Chloride: 107 mmol/L (ref 101–111)
Creatinine, Ser: 0.56 mg/dL (ref 0.44–1.00)
GFR calc Af Amer: >60 mL/min (ref >60)
Glucose, Bld: 96 mg/dL (ref 65–99)
POTASSIUM: 3.7 mmol/L (ref 3.5–5.1)
SODIUM: 140 mmol/L (ref 135–145)

## 2015-07-10 LAB — ABO/RH: ABO/RH(D): A POS

## 2015-07-10 LAB — PROTIME-INR
INR: 1.03 (ref 0.00–1.49)
Prothrombin Time: 13.7 seconds (ref 11.6–15.2)

## 2015-07-10 LAB — TYPE AND SCREEN
ABO/RH(D): A POS
ANTIBODY SCREEN: NEGATIVE

## 2015-07-10 LAB — SURGICAL PCR SCREEN
MRSA, PCR: NEGATIVE
Staphylococcus aureus: NEGATIVE

## 2015-07-10 MED ORDER — CEFAZOLIN SODIUM-DEXTROSE 2-3 GM-% IV SOLR
2.0000 g | INTRAVENOUS | Status: AC
  Administered 2015-07-11: 2 g via INTRAVENOUS
  Filled 2015-07-10: qty 50

## 2015-07-10 NOTE — Care Management Important Message (Signed)
Important Message  Patient Details  Name: Annette Beltran MRN: [id number] Date of Birth: 1946-02-27   Medicare Important Message Given:  [facility] notification given    Camillo Flaming 07/10/2015, 2:15 [place] Message  Patient Details  Name: Annette Beltran MRN: [id number] Date of Birth: 1946/02/04   Medicare Important Message Given:  Yes-second notification given    Camillo Flaming 07/10/2015, 2:15 PM

## 2015-07-10 NOTE — Progress Notes (Addendum)
Patient ID: Annette Beltran, female   DOB: 16-May-1946, 69 y.o.   MRN: [id number] 1 Day Post-Op  Subjective: No flatus, but had a BM yesterday.  Imaging looks better, but Dr. Ardis Hughs was not able to get past the stricture in the colon.  Pt hungry and denies pain today.  Objective: Vital signs in last 24 hours: Temp:  [97.4 F (36.3 C)-98.4 F (36.9 C)] 97.4 F (36.3 C) (09/07 0423) Pulse Rate:  [31-84] 84 (09/07 0423) Resp:  [14-26] 16 (09/07 0423) BP: (103-148)/(37-79) 129/59 mmHg (09/07 0423) SpO2:  [93 %-100 %] 93 % (09/07 0423) Weight:  [83.9 kg (184 lb 15.5 oz)] 83.9 kg (184 lb 15.5 oz) (09/06 1053) Last BM Date: 07/16/2015  Intake/Output from previous day: 09/06 0701 - 09/07 0700 In: 3500 [I.V.:3500] Out: 0  Intake/Output this shift:    General appearance: alert, cooperative and no distress GI: somewhat distended. Soft with no significant tenderness or guarding  Lab Results:   Recent Labs  07/08/2015 0539 07/10/15 0530  WBC 10.1 9.0  HGB 13.0 12.2  HCT 35.7* 34.9*  PLT 409* 440*   BMET  Recent Labs  07/17/2015 0539 07/10/15 0530  NA 138 140  K 3.2* 3.7  CL 106 107  CO2 26 24  GLUCOSE 116* 96  BUN 6 <5*  CREATININE 0.50 0.56  CALCIUM 7.7* 8.0*     Studies/Results: Mr Abdomen W Wo Contrast  07/19/2015   CLINICAL DATA:  Pancreatic carcinoma. Rising cancer enzymes. Bowel obstruction. Evaluate for metastatic disease in the liver or pancreas.  EXAM: MRI ABDOMEN WITHOUT AND WITH CONTRAST  TECHNIQUE: Multiplanar multisequence MR imaging of the abdomen was performed both before and after the administration of intravenous contrast.  CONTRAST:  43mL MULTIHANCE GADOBENATE DIMEGLUMINE 529 MG/ML IV SOLN  COMPARISON:  07/19/2015 CT.  MRI of 03/16/2015.  PET of 05/13/2015.  FINDINGS: Mild motion degradation.  Lower chest: Apparent nodularity within the right upper lung on image 74 of series 15 could relate to a atelectasis. No nodules readily apparent on the prior PET. Small  bilateral pleural effusions with adjacent atelectasis. Mild cardiomegaly.  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 Heterogeneous steatosis throughout the liver. Diminutive left portal vein, including on image 29 of series 14002. Probable perfusion anomaly within the hepatic dome, similar. No findings to suggest hepatic metastasis. Cholecystectomy with similar upper normal intrahepatic ducts. Hepatic veins patent.  Pancreas: Status post distal pancreatectomy, with no evidence of pancreatic head or uncinate process mass.  Spleen: Splenectomy.  Adrenals/Urinary Tract: Normal adrenal glands. Interpolar left renal lesion is too small to characterize. Normal right kidney, without hydronephrosis.  Stomach/Bowel: Gastric body is underdistended. Wall thickening which is likely secondary. The greater curvature of the stomach is intimately associated with the splenic flexure of the colon. Example image 50 of series 15. This is at the site of colonic obstruction on the prior CT. Colonic obstruction is resolved. Transverse colon is normal caliber. There is mild hyper enhancement and wall thickening involving the transverse colon. Example image 74 of series 1402. Small bowel caliber is normal.  Vascular/Lymphatic: Aortic and branch vessel atherosclerosis. Prominent peripancreatic nodes are not significantly changed. Example 9 mm portal caval node on image 29 of series 3, similar. Gastrohepatic ligament node measures 7 mm on image 31 of series 14004 and is unchanged.  Other: No ascites.  No evidence of omental or peritoneal disease.  Musculoskeletal: Fatty marrow within the upper lumbar spine is likely radiation induced.  IMPRESSION:  1. Redemonstration of distal pancreatectomy, splenectomy, cholecystectomy. No evidence of locally recurrent or metastatic disease. 2. 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 3. similar presumed perfusion anomaly within the right lobe of the  liver. 4. Resolved obstruction at the splenic flexure of the colon. Suspect adhesion between the stomach and splenic flexure . Mucosal hyper enhancement involving the transverse colon, suggesting colitis. 5. Small bilateral pleural effusions. Apparent nodularity in the right lung could be due to atelectasis. No pulmonary nodules on the PET of 05/13/2015.   Electronically Signed   By: Abigail Miyamoto M.D.   On: 07/17/2015 17:01   Dg Abd 2 Views  07/10/2015   CLINICAL DATA:  New bowel obstruction. Pain has resolved. History pancreatic cancer and Whipple procedure.  EXAM: ABDOMEN - 2 VIEW  COMPARISON:  MRI 07/23/2015.  CT 07/08/2015.  FINDINGS: Soft tissue structures unremarkable. Surgical clips upper abdomen. New interim near complete resolution of bowel distention. No free air. Pelvic calcifications consistent phleboliths. Degenerative changes lumbar spine. Bibasilar pleural parenchymal thickening noted consistent with scarring.  IMPRESSION: Interim near complete resolution of bowel distention.   Electronically Signed   By: Marcello Moores  Register   On: 07/10/2015 09:07   Dg Abd 2 Views  07/08/2015   CLINICAL DATA:  Followup colonic obstruction. Status post Whipple procedure for pancreatic cancer in 2015 with splenectomy and partial gastric resection.  EXAM: ABDOMEN - 2 VIEW  COMPARISON:  Abdomen and pelvis CT obtained yesterday.  FINDINGS: Again demonstrated is dilatation of the transverse colon to the level of the splenic flexure. This appears slightly improved. There is also slightly less dilatation of small bowel loops. Multiple air-fluid levels on the upright view in the colon and small bowel. No free peritoneal air. Excreted contrast in the urinary bladder. Upper abdominal surgical clips and staples. Small bilateral pleural effusions. Probable atelectasis at both lung bases.  IMPRESSION: 1. Slightly improved pattern of colonic obstruction at the level of the splenic flexure. 2. Small bilateral pleural effusions. 3.  Mild bibasilar atelectasis.   Electronically Signed   By: Claudie Revering M.D.   On: 07/08/2015 11:09    Anti-infectives: Anti-infectives    None      Assessment/Plan: Partial obstruction at splenic flexure of colon with history of chemoradiation and surgery for cancer of the body of the pancreas-distal pancreatectomy/splenectomy and partial gastrectomy one year ago. Rising CA-19-9 with recent negative PET/CT CT indicates kinking or adhesion to the gastric pouch although certainly postradiation scarring or tumor are possibilities.  Given tight stricture and rising CA 19-9, will still plan operative exploration and probable bowel resection.  Discussed possibility of ostomy, wound issues, leak.  Pt and husband agree to proceed.  Will get EKG, PT/INR/type and screen in preparation for OR tomorrow.   Whether there is recurrent cancer at site of obstruction, the obstruction is still secondary to cancer and treatment.  Either surgical adhesion, radiation fibrosis, or recurrent cancer would be causes of the obstruction.       LOS: 3 days    [facility] 07/10/2015

## 2015-07-11 ENCOUNTER — Inpatient Hospital Stay (HOSPITAL_COMMUNITY): Payer: Medicare Other | Admitting: Registered Nurse

## 2015-07-11 ENCOUNTER — Encounter (HOSPITAL_COMMUNITY): Admission: EM | Disposition: E | Payer: Self-pay | Source: Home / Self Care | Attending: General Surgery

## 2015-07-11 ENCOUNTER — Other Ambulatory Visit: Payer: Medicare Other

## 2015-07-11 ENCOUNTER — Encounter (HOSPITAL_COMMUNITY): Payer: Self-pay | Admitting: Registered Nurse

## 2015-07-11 HISTORY — PX: LAPAROTOMY: SHX154

## 2015-07-11 SURGERY — LAPAROTOMY, EXPLORATORY
Anesthesia: General

## 2015-07-11 MED ORDER — LORAZEPAM 2 MG/ML IJ SOLN
0.5000 mg | Freq: Three times a day (TID) | INTRAMUSCULAR | Status: DC | PRN
  Administered 2015-07-11: 0.5 mg via INTRAVENOUS
  Administered 2015-07-12 – 2015-07-13 (×2): 1 mg via INTRAVENOUS
  Administered 2015-07-14: 0.5 mg via INTRAVENOUS
  Administered 2015-07-15: 1 mg via INTRAVENOUS
  Filled 2015-07-11 (×5): qty 1

## 2015-07-11 MED ORDER — MORPHINE SULFATE 1 MG/ML IV SOLN
INTRAVENOUS | Status: DC
  Administered 2015-07-11: 3 mg via INTRAVENOUS
  Administered 2015-07-11: 16:00:00 via INTRAVENOUS
  Administered 2015-07-11: 9 mg via INTRAVENOUS
  Administered 2015-07-12: 10.5 mg via INTRAVENOUS
  Administered 2015-07-12: 9 mg via INTRAVENOUS
  Administered 2015-07-12 (×2): via INTRAVENOUS
  Administered 2015-07-12: 1.5 mg via INTRAVENOUS
  Administered 2015-07-12: 4.5 mg via INTRAVENOUS
  Administered 2015-07-13: 14:00:00 via INTRAVENOUS
  Administered 2015-07-13: 4.5 mg via INTRAVENOUS
  Administered 2015-07-13: 15 mg via INTRAVENOUS
  Administered 2015-07-13: 19.08 mg via INTRAVENOUS
  Administered 2015-07-13: 5.59 mg via INTRAVENOUS
  Administered 2015-07-13: 3 mg via INTRAVENOUS
  Administered 2015-07-14: 0 mg via INTRAVENOUS
  Administered 2015-07-14: 3 mg via INTRAVENOUS
  Administered 2015-07-14: 10.5 mg via INTRAVENOUS
  Administered 2015-07-14: 9 mg via INTRAVENOUS
  Administered 2015-07-14: 0 mg via INTRAVENOUS
  Administered 2015-07-14: 14:00:00 via INTRAVENOUS
  Administered 2015-07-14: 3 mg via INTRAVENOUS
  Administered 2015-07-15: 1.5 mg via INTRAVENOUS
  Filled 2015-07-11 (×6): qty 25

## 2015-07-11 MED ORDER — SUCCINYLCHOLINE CHLORIDE 20 MG/ML IJ SOLN
INTRAMUSCULAR | Status: DC | PRN
  Administered 2015-07-11: 100 mg via INTRAVENOUS

## 2015-07-11 MED ORDER — BUPIVACAINE 0.25 % ON-Q PUMP DUAL CATH 300 ML
300.0000 mL | INJECTION | Status: DC
  Administered 2015-07-11: 300 mL
  Filled 2015-07-11: qty 300

## 2015-07-11 MED ORDER — MORPHINE SULFATE 1 MG/ML IV SOLN
INTRAVENOUS | Status: AC
  Filled 2015-07-11: qty 25

## 2015-07-11 MED ORDER — LIDOCAINE HCL (CARDIAC) 20 MG/ML IV SOLN
INTRAVENOUS | Status: DC | PRN
  Administered 2015-07-11: 100 mg via INTRAVENOUS

## 2015-07-11 MED ORDER — LACTATED RINGERS IV SOLN: INTRAVENOUS | Status: DC

## 2015-07-11 MED ORDER — ONDANSETRON HCL 4 MG/2ML IJ SOLN: 4.0000 mg | Freq: Four times a day (QID) | INTRAMUSCULAR | Status: DC | PRN

## 2015-07-11 MED ORDER — DEXAMETHASONE SODIUM PHOSPHATE 10 MG/ML IJ SOLN
INTRAMUSCULAR | Status: AC
  Filled 2015-07-11: qty 1

## 2015-07-11 MED ORDER — LIDOCAINE HCL (CARDIAC) 20 MG/ML IV SOLN
INTRAVENOUS | Status: AC
  Filled 2015-07-11: qty 5

## 2015-07-11 MED ORDER — PHENYLEPHRINE 40 MCG/ML (10ML) SYRINGE FOR IV PUSH (FOR BLOOD PRESSURE SUPPORT)
PREFILLED_SYRINGE | INTRAVENOUS | Status: AC
  Filled 2015-07-11: qty 10

## 2015-07-11 MED ORDER — METOPROLOL TARTRATE 1 MG/ML IV SOLN
5.0000 mg | Freq: Once | INTRAVENOUS | Status: AC
  Administered 2015-07-11: 5 mg via INTRAVENOUS
  Filled 2015-07-11: qty 5

## 2015-07-11 MED ORDER — HYDROMORPHONE HCL 2 MG/ML IJ SOLN
INTRAMUSCULAR | Status: AC
  Filled 2015-07-11: qty 1

## 2015-07-11 MED ORDER — DIPHENHYDRAMINE HCL 12.5 MG/5ML PO ELIX: 12.5000 mg | ORAL_SOLUTION | Freq: Four times a day (QID) | ORAL | Status: DC | PRN

## 2015-07-11 MED ORDER — MEPERIDINE HCL 50 MG/ML IJ SOLN: 6.2500 mg | INTRAMUSCULAR | Status: DC | PRN

## 2015-07-11 MED ORDER — ONDANSETRON HCL 4 MG/2ML IJ SOLN
INTRAMUSCULAR | Status: DC | PRN
  Administered 2015-07-11: 4 mg via INTRAVENOUS

## 2015-07-11 MED ORDER — ONDANSETRON HCL 4 MG/2ML IJ SOLN: 4.0000 mg | Freq: Once | INTRAMUSCULAR | Status: DC | PRN

## 2015-07-11 MED ORDER — LACTATED RINGERS IV SOLN
INTRAVENOUS | Status: DC
  Administered 2015-07-11: 16:00:00 via INTRAVENOUS

## 2015-07-11 MED ORDER — PROPOFOL 10 MG/ML IV BOLUS
INTRAVENOUS | Status: DC | PRN
  Administered 2015-07-11: 150 mg via INTRAVENOUS

## 2015-07-11 MED ORDER — PANTOPRAZOLE SODIUM 40 MG IV SOLR
40.0000 mg | Freq: Every day | INTRAVENOUS | Status: DC
Start: 2015-07-11 — End: 2015-07-18
  Administered 2015-07-11 – 2015-07-17 (×7): 40 mg via INTRAVENOUS
  Filled 2015-07-11 (×7): qty 40

## 2015-07-11 MED ORDER — DIPHENHYDRAMINE HCL 50 MG/ML IJ SOLN
12.5000 mg | Freq: Four times a day (QID) | INTRAMUSCULAR | Status: DC | PRN
  Administered 2015-07-11 – 2015-07-14 (×2): 12.5 mg via INTRAVENOUS

## 2015-07-11 MED ORDER — METHOCARBAMOL 1000 MG/10ML IJ SOLN
500.0000 mg | Freq: Three times a day (TID) | INTRAVENOUS | Status: DC | PRN
  Filled 2015-07-11: qty 5

## 2015-07-11 MED ORDER — HYDROMORPHONE HCL 1 MG/ML IJ SOLN
INTRAMUSCULAR | Status: DC | PRN
  Administered 2015-07-11 (×4): 0.5 mg via INTRAVENOUS

## 2015-07-11 MED ORDER — NALOXONE HCL 0.4 MG/ML IJ SOLN: 0.4000 mg | INTRAMUSCULAR | Status: DC | PRN

## 2015-07-11 MED ORDER — BUPIVACAINE ON-Q PAIN PUMP (FOR ORDER SET NO CHG)
INJECTION | Status: AC
  Filled 2015-07-11: qty 1

## 2015-07-11 MED ORDER — ONDANSETRON HCL 4 MG/2ML IJ SOLN
INTRAMUSCULAR | Status: AC
  Filled 2015-07-11: qty 2

## 2015-07-11 MED ORDER — LACTATED RINGERS IV SOLN
INTRAVENOUS | Status: DC | PRN
  Administered 2015-07-11: 11:00:00 via INTRAVENOUS

## 2015-07-11 MED ORDER — BUPIVACAINE HCL (PF) 0.25 % IJ SOLN
INTRAMUSCULAR | Status: AC
Start: 2015-07-11 — End: 2015-07-11
  Filled 2015-07-11: qty 30

## 2015-07-11 MED ORDER — BUPIVACAINE HCL (PF) 0.25 % IJ SOLN
INTRAMUSCULAR | Status: DC | PRN
  Administered 2015-07-11: 3 mL

## 2015-07-11 MED ORDER — ROCURONIUM BROMIDE 100 MG/10ML IV SOLN
INTRAVENOUS | Status: DC | PRN
  Administered 2015-07-11: 10 mg via INTRAVENOUS
  Administered 2015-07-11: 40 mg via INTRAVENOUS

## 2015-07-11 MED ORDER — MIDAZOLAM HCL 2 MG/2ML IJ SOLN
INTRAMUSCULAR | Status: AC
  Filled 2015-07-11: qty 4

## 2015-07-11 MED ORDER — FENTANYL CITRATE (PF) 250 MCG/5ML IJ SOLN
INTRAMUSCULAR | Status: AC
  Filled 2015-07-11: qty 25

## 2015-07-11 MED ORDER — DEXAMETHASONE SODIUM PHOSPHATE 10 MG/ML IJ SOLN
INTRAMUSCULAR | Status: DC | PRN
  Administered 2015-07-11: 10 mg via INTRAVENOUS

## 2015-07-11 MED ORDER — EPHEDRINE SULFATE 50 MG/ML IJ SOLN
INTRAMUSCULAR | Status: DC | PRN
  Administered 2015-07-11 (×2): 5 mg via INTRAVENOUS

## 2015-07-11 MED ORDER — SODIUM CHLORIDE 0.9 % IJ SOLN: 9.0000 mL | INTRAMUSCULAR | Status: DC | PRN

## 2015-07-11 MED ORDER — CEFAZOLIN SODIUM-DEXTROSE 2-3 GM-% IV SOLR
2.0000 g | Freq: Three times a day (TID) | INTRAVENOUS | Status: AC
  Administered 2015-07-11: 2 g via INTRAVENOUS
  Filled 2015-07-11: qty 50

## 2015-07-11 MED ORDER — FENTANYL CITRATE (PF) 100 MCG/2ML IJ SOLN
INTRAMUSCULAR | Status: DC | PRN
  Administered 2015-07-11: 100 ug via INTRAVENOUS
  Administered 2015-07-11 (×3): 50 ug via INTRAVENOUS

## 2015-07-11 MED ORDER — GLYCOPYRROLATE 0.2 MG/ML IJ SOLN
INTRAMUSCULAR | Status: DC | PRN
  Administered 2015-07-11: 0.2 mg via INTRAVENOUS

## 2015-07-11 MED ORDER — HYDROMORPHONE HCL 1 MG/ML IJ SOLN: 0.2500 mg | INTRAMUSCULAR | Status: DC | PRN

## 2015-07-11 MED ORDER — PROPOFOL 10 MG/ML IV BOLUS
INTRAVENOUS | Status: AC
  Filled 2015-07-11: qty 20

## 2015-07-11 MED ORDER — SUGAMMADEX SODIUM 200 MG/2ML IV SOLN
INTRAVENOUS | Status: DC | PRN
  Administered 2015-07-11: 150 mg via INTRAVENOUS

## 2015-07-11 MED ORDER — ACETAMINOPHEN 10 MG/ML IV SOLN
1000.0000 mg | Freq: Four times a day (QID) | INTRAVENOUS | Status: AC
  Administered 2015-07-11 – 2015-07-12 (×4): 1000 mg via INTRAVENOUS
  Filled 2015-07-11 (×4): qty 100

## 2015-07-11 MED ORDER — ROCURONIUM BROMIDE 100 MG/10ML IV SOLN
INTRAVENOUS | Status: AC
  Filled 2015-07-11: qty 1

## 2015-07-11 MED ORDER — LORAZEPAM BOLUS VIA INFUSION: 0.5000 mg | Freq: Three times a day (TID) | INTRAVENOUS | Status: DC | PRN

## 2015-07-11 MED ORDER — HYDRALAZINE HCL 20 MG/ML IJ SOLN: 10.0000 mg | INTRAMUSCULAR | Status: DC | PRN

## 2015-07-11 MED ORDER — ATROPINE SULFATE 0.4 MG/ML IJ SOLN
INTRAMUSCULAR | Status: DC | PRN
  Administered 2015-07-11: .6 mg via INTRAVENOUS

## 2015-07-11 MED ORDER — PHENYLEPHRINE HCL 10 MG/ML IJ SOLN
INTRAMUSCULAR | Status: DC | PRN
  Administered 2015-07-11: 80 ug via INTRAVENOUS
  Administered 2015-07-11: 40 ug via INTRAVENOUS

## 2015-07-11 MED ORDER — ONDANSETRON 4 MG PO TBDP
4.0000 mg | ORAL_TABLET | Freq: Four times a day (QID) | ORAL | Status: DC | PRN
  Filled 2015-07-11: qty 1

## 2015-07-11 MED ORDER — LACTATED RINGERS IV BOLUS (SEPSIS)
1000.0000 mL | Freq: Once | INTRAVENOUS | Status: AC
  Administered 2015-07-11: 1000 mL via INTRAVENOUS

## 2015-07-11 MED ORDER — DIPHENHYDRAMINE HCL 50 MG/ML IJ SOLN
12.5000 mg | Freq: Four times a day (QID) | INTRAMUSCULAR | Status: DC | PRN
  Filled 2015-07-11 (×2): qty 1

## 2015-07-11 MED ORDER — MIDAZOLAM HCL 5 MG/5ML IJ SOLN
INTRAMUSCULAR | Status: DC | PRN
  Administered 2015-07-11 (×2): 1 mg via INTRAVENOUS

## 2015-07-11 SURGICAL SUPPLY — 47 items
APPLICATOR COTTON TIP 6IN STRL (MISCELLANEOUS) IMPLANT
BINDER BREAST LRG (GAUZE/BANDAGES/DRESSINGS) ×3 IMPLANT
BLADE EXTENDED COATED 6.5IN (ELECTRODE) ×3 IMPLANT
BLADE HEX COATED 2.75 (ELECTRODE) ×3 IMPLANT
BLADE SURG SZ10 CARB STEEL (BLADE) IMPLANT
COVER MAYO STAND STRL (DRAPES) ×3 IMPLANT
COVER SURGICAL LIGHT HANDLE (MISCELLANEOUS) ×3 IMPLANT
DRAIN CHANNEL 19F RND (DRAIN) IMPLANT
DRAPE LAPAROSCOPIC ABDOMINAL (DRAPES) ×3 IMPLANT
DRAPE SHEET LG 3/4 BI-LAMINATE (DRAPES) IMPLANT
DRAPE WARM FLUID 44X44 (DRAPE) ×3 IMPLANT
DRSG PAD ABDOMINAL 8X10 ST (GAUZE/BANDAGES/DRESSINGS) ×3 IMPLANT
ELECT REM PT RETURN 9FT ADLT (ELECTROSURGICAL) ×3
ELECTRODE REM PT RTRN 9FT ADLT (ELECTROSURGICAL) ×1 IMPLANT
EVACUATOR DRAINAGE 10X20 100CC (DRAIN) IMPLANT
EVACUATOR SILICONE 100CC (DRAIN)
GAUZE SPONGE 4X4 12PLY STRL (GAUZE/BANDAGES/DRESSINGS) ×3 IMPLANT
GLOVE BIO SURGEON STRL SZ 6 (GLOVE) ×6 IMPLANT
GLOVE BIOGEL PI IND STRL 7.0 (GLOVE) ×1 IMPLANT
GLOVE BIOGEL PI INDICATOR 7.0 (GLOVE) ×2
GLOVE ECLIPSE 8.0 STRL XLNG CF (GLOVE) ×3 IMPLANT
GLOVE INDICATOR 6.5 STRL GRN (GLOVE) ×15 IMPLANT
GLOVE INDICATOR 8.0 STRL GRN (GLOVE) ×3 IMPLANT
GOWN STRL REUS W/ TWL XL LVL3 (GOWN DISPOSABLE) ×6 IMPLANT
GOWN STRL REUS W/TWL 2XL LVL3 (GOWN DISPOSABLE) ×6 IMPLANT
GOWN STRL REUS W/TWL XL LVL3 (GOWN DISPOSABLE) ×12
KIT BASIN OR (CUSTOM PROCEDURE TRAY) ×3 IMPLANT
LEGGING LITHOTOMY PAIR STRL (DRAPES) IMPLANT
LIGASURE IMPACT 36 18CM CVD LR (INSTRUMENTS) ×3 IMPLANT
NS IRRIG 1000ML POUR BTL (IV SOLUTION) ×6 IMPLANT
PACK GENERAL/GYN (CUSTOM PROCEDURE TRAY) ×3 IMPLANT
RELOAD PROXIMATE 75MM BLUE (ENDOMECHANICALS) ×15 IMPLANT
SPONGE LAP 18X18 X RAY DECT (DISPOSABLE) ×18 IMPLANT
STAPLER GUN LINEAR PROX 60 (STAPLE) ×3 IMPLANT
STAPLER PROXIMATE 75MM BLUE (STAPLE) ×3 IMPLANT
STAPLER VISISTAT 35W (STAPLE) ×3 IMPLANT
SUCTION POOLE TIP (SUCTIONS) ×3 IMPLANT
SUT ETHILON 2 0 PS N (SUTURE) ×3 IMPLANT
SUT PDS AB 1 CTX 36 (SUTURE) IMPLANT
SUT PDS AB 1 TP1 96 (SUTURE) ×15 IMPLANT
SUT PDS AB 3-0 SH 27 (SUTURE) ×6 IMPLANT
SUT SILK 2 0SH CR/8 30 (SUTURE) ×3 IMPLANT
SUT VIC AB 2-0 SH 18 (SUTURE) ×3 IMPLANT
SUT VIC AB 3-0 SH 18 (SUTURE) ×3 IMPLANT
TOWEL OR 17X26 10 PK STRL BLUE (TOWEL DISPOSABLE) ×6 IMPLANT
TRAY FOLEY W/METER SILVER 14FR (SET/KITS/TRAYS/PACK) ×3 IMPLANT
YANKAUER SUCT BULB TIP NO VENT (SUCTIONS) ×3 IMPLANT

## 2015-07-11 NOTE — Progress Notes (Signed)
Pt arrived to 1238 at 1630.  Pt vss and denies any cp.  No sign of resp distress.  ABD binder intact, JP drain charged, and NG tube on low wall intermittent suction .Pt appears to be in Afib. Rate 90-110  Informed Dr. Barry Dienes about the Afib.  5mg  IV metoprolol and EKG ordered.  Irven Baltimore, RN

## 2015-07-11 NOTE — Interval H&P Note (Signed)
History and Physical Interval Note:  07/04/2015 10:48 AM  Annette Beltran  has presented today for surgery, with the diagnosis of SMALL AND LARGE BOWEL OBSTRUCTION with recurrent cancer.  The various methods of treatment have been discussed with the patient and family. After consideration of risks, benefits and other options for treatment, the patient has consented to  Procedure(s): EXPLORATORY LAPAROTOMY WITH POSSIBLE BOWEL RESECTION (N/A) LYSIS OF ADHESION (N/A) as a surgical intervention .  The patient's history has been reviewed, patient examined, no change in status, stable for surgery.  I have reviewed the patient's chart and labs.  Questions were answered to the patient's satisfaction.  I discussed the risk of ostomy and the risk of being unable to resect the source, requiring bypass only.  I discussed the risk of damage to bowel.     [name]

## 2015-07-11 NOTE — Care Management Note (Signed)
Case Management Note  Patient Details  Name: Annette Beltran MRN: [id number] Date of Birth: 12-15-1945  Subjective/Objective:  69 y/o f admitted w/colonic obstruction. Exp lap w/loa today.From home.                  Action/Plan:d/c plan home.   Expected Discharge Date:                 Expected Discharge Plan:  Home/Self Care  In-House Referral:     Discharge planning Services  CM Consult  Post Acute Care Choice:    Choice offered to:     DME Arranged:    DME Agency:     HH Arranged:    HH Agency:     Status of Service:  In process, will continue to follow  Medicare Important Message Given:  Yes-second notification given Date Medicare IM Given:    Medicare IM give by:    Date Additional Medicare IM Given:    Additional Medicare Important Message give by:     If discussed at [place] of Stay Meetings, dates discussed:    Additional Comments:  Dessa Phi, RN 07/21/2015, 2:45 PM

## 2015-07-11 NOTE — Anesthesia Postprocedure Evaluation (Signed)
Anesthesia Post Note  Patient: Annette Beltran  Procedure(s) Performed: Procedure(s) (LRB): partial colectomy, splenic flexure take down, partial gastrectomy (N/A)  Anesthesia type: general  Patient location: PACU  Post pain: Pain level controlled  Post assessment: Patient's Cardiovascular Status Stable  Last Vitals:  Filed Vitals:   07/14/2015 1545  BP: 111/54  Pulse: 74  Temp:   Resp: 16    Post vital signs: Reviewed and stable  Level of consciousness: sedated  Complications: No apparent anesthesia complications

## 2015-07-11 NOTE — Progress Notes (Signed)
COURTESY NOTE: For surgery today.  Since we're going to explore the abd in any case, perhaps we can do some debulking if a single small pancreatic cancer deposit is found, or a biopsy if multiple deposits are found.  Will follow closely with you  GM

## 2015-07-11 NOTE — Transfer of Care (Signed)
Immediate Anesthesia Transfer of Care Note  Patient: Annette Beltran  Procedure(s) Performed: Procedure(s): partial colectomy, splenic flexure take down, partial gastrectomy (N/A)  Patient Location: PACU  Anesthesia Type:General  Level of Consciousness: awake, alert , oriented and patient cooperative  Airway & Oxygen Therapy: Patient Spontanous Breathing and Patient connected to face mask oxygen  Post-op Assessment: Report given to RN, Post -op Vital signs reviewed and stable and Patient moving all extremities  Post vital signs: Reviewed and stable  Last Vitals:  Filed Vitals:   08/01/2015 1503  BP:   Pulse: 82  Temp:   Resp: 18    Complications: No apparent anesthesia complications

## 2015-07-11 NOTE — Anesthesia Procedure Notes (Signed)
Procedure Name: Intubation Date/Time: 07/23/2015 11:09 AM Performed by: Carleene Cooper A Pre-anesthesia Checklist: Patient identified, Emergency Drugs available, Suction available, Patient being monitored and Timeout performed Patient Re-evaluated:Patient Re-evaluated prior to inductionOxygen Delivery Method: Circle system utilized Preoxygenation: Pre-oxygenation with 100% oxygen Intubation Type: IV induction Ventilation: Mask ventilation without difficulty and Oral airway inserted - appropriate to patient size Laryngoscope Size: Glidescope and 4 Grade View: Grade III Tube type: Oral Number of attempts: 3 Airway Equipment and Method: Video-laryngoscopy and Stylet Placement Confirmation: ETT inserted through vocal cords under direct vision,  positive ETCO2 and breath sounds checked- equal and bilateral Secured at: 21 cm Tube secured with: Tape Dental Injury: Teeth and Oropharynx as per pre-operative assessment  Comments: DL X 1 by CRNA, Grade 3 view with MAC 4. Easy mask, DL X 1 by Dr. Conrad Delaplaine. Grade 3 by him. Masked. DL X1 with #4 Glidescope blade. Grade 1 view. ATOI. ETT secured with trach ties due to pt allergy.

## 2015-07-11 NOTE — Op Note (Signed)
PRE-OPERATIVE DIAGNOSIS: Colonic obstruction, recurrent pancreatic cancer  POST-OPERATIVE DIAGNOSIS:  Same  PROCEDURE:  Procedure(s): Exploratory laparotomy, partial colectomy, splenic flexure takedown, partial gastrectomy  SURGEON:  Surgeon(s): [name], MD  ASSISTANT:  Neysa Bonito, MD Fanny Skates, MD Alvira Monday, RNFA  ANESTHESIA:   general  DRAINS: (38 Fr Blake drain posterior to stomach) Blake drain(s) in the LUQ   LOCAL MEDICATIONS USED:  BUPIVICAINE   SPECIMEN:  Source of Specimen:  en bloc resection of colon and stomach  DISPOSITION OF SPECIMEN:  PATHOLOGY  COUNTS:  YES  DICTATION: .Dragon Dictation  PLAN OF CARE: Admit to inpatient   PATIENT DISPOSITION:  PACU - hemodynamically stable.  FINDINGS:  Dense adhesions with mass on posterior stomach tethering the colon  EBL: 100  PROCEDURE:  Patient was identified in the holding area and taken to the operating room where she was placed supine on the operating room table. General anesthesia was induced. Foley catheter, and NG tube were placed. Her left arm was tucked. Her abdomen was prepped and draped in sterile fashion. Timeout was performed according to the surgical safety checklist. When all was correct, we continued.  The prior upper midline incision was opened with the #10 blade. The subcutaneous tissues and fascia were opened with the cautery. The fascia was opened for the remainder of the incision. The Bookwalter was set up for better visualization. Adhesions to the abdominal wall were taken down both sharply and with cautery. This was primarily omentum. The lesser sac was opened. The colon was traced backwards and the site of tethering was high upon the posterior stomach. The colon also had an additional nodule that was identified and it was divided with the stapler proximal to this. The colon past the site of adherence was also divided with the GIA-75 stapler.  A combination of cautery, LigaSure, blunt  dissection, and sharp dissection was used to remove the mass from the diaphragm and the crus.  A portion of the stomach was able to be stabilized. The remainder was too thick to stapler. This was divided with the cautery. The specimen was passed off.  The gastrotomy was then closed with running 3-0 PDS.  Care was taken to make sure the NG tube was not incorporated. The staple line was then oversewn with 2-0 silk suture. The colon was then reapproximated with 3-0 Vicryls.  The side-to-side functional end-to-end anastomosis was then created with the GIA-75 stapler and the TA 60 stapler. The defect was extremely large so it was not closed.  The external accessories were removed including the suction and the cautery. Gowns and gloves were changed. New drapes were placed. The abdomen was then irrigated. A 19 [place] Blake drain was placed behind the stomach just posterior to the gastrotomy. The drain was secured with the 2-0 nylon. The On-Q tunnelers were placed on either side of the fascial incision and the preperitoneal space. The fascia was then closed using running #1 looped PDS suture.  The skin was irrigated and closed with staples.  The On-Q catheters were advanced through the tunneler sheaths.   These were secured with Dermabond and staples. The wound was dressed with gauze, ABDs, and then covered with an abdominal binder. The needle, sponge, and instrument counts were correct 2. Patient was allowed to emerge from anesthesia and taken the PACU in stable condition.

## 2015-07-11 NOTE — H&P (View-Only) (Signed)
Patient ID: Annette Beltran, female   DOB: 07-28-1946, 69 y.o.   MRN: [id number] 1 Day Post-Op  Subjective: No flatus, but had a BM yesterday.  Imaging looks better, but Dr. Ardis Hughs was not able to get past the stricture in the colon.  Pt hungry and denies pain today.  Objective: Vital signs in last 24 hours: Temp:  [97.4 F (36.3 C)-98.4 F (36.9 C)] 97.4 F (36.3 C) (09/07 0423) Pulse Rate:  [31-84] 84 (09/07 0423) Resp:  [14-26] 16 (09/07 0423) BP: (103-148)/(37-79) 129/59 mmHg (09/07 0423) SpO2:  [93 %-100 %] 93 % (09/07 0423) Weight:  [83.9 kg (184 lb 15.5 oz)] 83.9 kg (184 lb 15.5 oz) (09/06 1053) Last BM Date: 07/07/2015  Intake/Output from previous day: 09/06 0701 - 09/07 0700 In: 3500 [I.V.:3500] Out: 0  Intake/Output this shift:    General appearance: alert, cooperative and no distress GI: somewhat distended. Soft with no significant tenderness or guarding  Lab Results:   Recent Labs  07/27/2015 0539 07/10/15 0530  WBC 10.1 9.0  HGB 13.0 12.2  HCT 35.7* 34.9*  PLT 409* 440*   BMET  Recent Labs  07/07/2015 0539 07/10/15 0530  NA 138 140  K 3.2* 3.7  CL 106 107  CO2 26 24  GLUCOSE 116* 96  BUN 6 <5*  CREATININE 0.50 0.56  CALCIUM 7.7* 8.0*     Studies/Results: Mr Abdomen W Wo Contrast  07/14/2015   CLINICAL DATA:  Pancreatic carcinoma. Rising cancer enzymes. Bowel obstruction. Evaluate for metastatic disease in the liver or pancreas.  EXAM: MRI ABDOMEN WITHOUT AND WITH CONTRAST  TECHNIQUE: Multiplanar multisequence MR imaging of the abdomen was performed both before and after the administration of intravenous contrast.  CONTRAST:  41mL MULTIHANCE GADOBENATE DIMEGLUMINE 529 MG/ML IV SOLN  COMPARISON:  07/05/2015 CT.  MRI of 03/16/2015.  PET of 05/13/2015.  FINDINGS: Mild motion degradation.  Lower chest: Apparent nodularity within the right upper lung on image 74 of series 15 could relate to a atelectasis. No nodules readily apparent on the prior PET. Small  bilateral pleural effusions with adjacent atelectasis. Mild cardiomegaly.  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 Heterogeneous steatosis throughout the liver. Diminutive left portal vein, including on image 29 of series 14002. Probable perfusion anomaly within the hepatic dome, similar. No findings to suggest hepatic metastasis. Cholecystectomy with similar upper normal intrahepatic ducts. Hepatic veins patent.  Pancreas: Status post distal pancreatectomy, with no evidence of pancreatic head or uncinate process mass.  Spleen: Splenectomy.  Adrenals/Urinary Tract: Normal adrenal glands. Interpolar left renal lesion is too small to characterize. Normal right kidney, without hydronephrosis.  Stomach/Bowel: Gastric body is underdistended. Wall thickening which is likely secondary. The greater curvature of the stomach is intimately associated with the splenic flexure of the colon. Example image 50 of series 15. This is at the site of colonic obstruction on the prior CT. Colonic obstruction is resolved. Transverse colon is normal caliber. There is mild hyper enhancement and wall thickening involving the transverse colon. Example image 74 of series 1402. Small bowel caliber is normal.  Vascular/Lymphatic: Aortic and branch vessel atherosclerosis. Prominent peripancreatic nodes are not significantly changed. Example 9 mm portal caval node on image 29 of series 3, similar. Gastrohepatic ligament node measures 7 mm on image 31 of series 14004 and is unchanged.  Other: No ascites.  No evidence of omental or peritoneal disease.  Musculoskeletal: Fatty marrow within the upper lumbar spine is likely radiation induced.  IMPRESSION:  1. Redemonstration of distal pancreatectomy, splenectomy, cholecystectomy. No evidence of locally recurrent or metastatic disease. 2. 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 3. similar presumed perfusion anomaly within the right lobe of the  liver. 4. Resolved obstruction at the splenic flexure of the colon. Suspect adhesion between the stomach and splenic flexure . Mucosal hyper enhancement involving the transverse colon, suggesting colitis. 5. Small bilateral pleural effusions. Apparent nodularity in the right lung could be due to atelectasis. No pulmonary nodules on the PET of 05/13/2015.   Electronically Signed   By: Abigail Miyamoto M.D.   On: 07/26/2015 17:01   Dg Abd 2 Views  07/10/2015   CLINICAL DATA:  New bowel obstruction. Pain has resolved. History pancreatic cancer and Whipple procedure.  EXAM: ABDOMEN - 2 VIEW  COMPARISON:  MRI 07/17/2015.  CT 07/28/2015.  FINDINGS: Soft tissue structures unremarkable. Surgical clips upper abdomen. New interim near complete resolution of bowel distention. No free air. Pelvic calcifications consistent phleboliths. Degenerative changes lumbar spine. Bibasilar pleural parenchymal thickening noted consistent with scarring.  IMPRESSION: Interim near complete resolution of bowel distention.   Electronically Signed   By: Marcello Moores  Register   On: 07/10/2015 09:07   Dg Abd 2 Views  07/08/2015   CLINICAL DATA:  Followup colonic obstruction. Status post Whipple procedure for pancreatic cancer in 2015 with splenectomy and partial gastric resection.  EXAM: ABDOMEN - 2 VIEW  COMPARISON:  Abdomen and pelvis CT obtained yesterday.  FINDINGS: Again demonstrated is dilatation of the transverse colon to the level of the splenic flexure. This appears slightly improved. There is also slightly less dilatation of small bowel loops. Multiple air-fluid levels on the upright view in the colon and small bowel. No free peritoneal air. Excreted contrast in the urinary bladder. Upper abdominal surgical clips and staples. Small bilateral pleural effusions. Probable atelectasis at both lung bases.  IMPRESSION: 1. Slightly improved pattern of colonic obstruction at the level of the splenic flexure. 2. Small bilateral pleural effusions. 3.  Mild bibasilar atelectasis.   Electronically Signed   By: Claudie Revering M.D.   On: 07/08/2015 11:09    Anti-infectives: Anti-infectives    None      Assessment/Plan: Partial obstruction at splenic flexure of colon with history of chemoradiation and surgery for cancer of the body of the pancreas-distal pancreatectomy/splenectomy and partial gastrectomy one year ago. Rising CA-19-9 with recent negative PET/CT CT indicates kinking or adhesion to the gastric pouch although certainly postradiation scarring or tumor are possibilities.  Given tight stricture and rising CA 19-9, will still plan operative exploration and probable bowel resection.  Discussed possibility of ostomy, wound issues, leak.  Pt and husband agree to proceed.  Will get EKG, PT/INR/type and screen in preparation for OR tomorrow.   Whether there is recurrent cancer at site of obstruction, the obstruction is still secondary to cancer and treatment.  Either surgical adhesion, radiation fibrosis, or recurrent cancer would be causes of the obstruction.       LOS: 3 days    [facility] 07/10/2015

## 2015-07-11 NOTE — Anesthesia Preprocedure Evaluation (Addendum)
Anesthesia Evaluation  Patient identified by MRN, date of birth, ID band  Reviewed: Allergy & Precautions, NPO status , Patient's Chart, lab work & pertinent test results  Airway Mallampati: I  TM Distance: >3 FB Neck ROM: Full    Dental   Pulmonary former smoker,    Pulmonary exam normal        Cardiovascular Normal cardiovascular exam     Neuro/Psych Anxiety    GI/Hepatic   Endo/Other    Renal/GU      Musculoskeletal   Abdominal   Peds  Hematology   Anesthesia Other Findings   Reproductive/Obstetrics                            Anesthesia Physical Anesthesia Plan  ASA: III  Anesthesia Plan: General   Post-op Pain Management:    Induction: Intravenous, Rapid sequence and Cricoid pressure planned  Airway Management Planned: Oral ETT  Additional Equipment:   Intra-op Plan:   Post-operative Plan: Extubation in OR  Informed Consent: I have reviewed the patients History and Physical, chart, labs and discussed the procedure including the risks, benefits and alternatives for the proposed anesthesia with the patient or authorized representative who has indicated his/her understanding and acceptance.     Plan Discussed with: CRNA and Surgeon  Anesthesia Plan Comments:        Anesthesia Quick Evaluation

## 2015-07-11 NOTE — Progress Notes (Signed)
Pt requested for staff to not wake her during night.  VSS.  Instructed pt that staff does not have to take VS but will need to check in on pt q2h.  Pt in no apparent discomfort but appears anxious and discussing upcoming surgery in am.  Xanax given as requested.

## 2015-07-12 ENCOUNTER — Other Ambulatory Visit: Payer: Self-pay | Admitting: General Surgery

## 2015-07-12 ENCOUNTER — Encounter (HOSPITAL_COMMUNITY): Payer: Self-pay | Admitting: General Surgery

## 2015-07-12 LAB — BASIC METABOLIC PANEL
ANION GAP: 6 (ref 5–15)
BUN: 6 mg/dL (ref 6–20)
CHLORIDE: 105 mmol/L (ref 101–111)
CO2: 26 mmol/L (ref 22–32)
Calcium: 7.7 mg/dL — ABNORMAL LOW (ref 8.9–10.3)
Creatinine, Ser: 0.56 mg/dL (ref 0.44–1.00)
GFR calc Af Amer: >60 mL/min (ref >60)
GFR calc non Af Amer: >60 mL/min (ref >60)
GLUCOSE: 219 mg/dL — AB (ref 65–99)
POTASSIUM: 4.9 mmol/L (ref 3.5–5.1)
Sodium: 137 mmol/L (ref 135–145)

## 2015-07-12 LAB — CBC
HEMATOCRIT: 30.5 % — AB (ref 36.0–46.0)
HEMOGLOBIN: 10.6 g/dL — AB (ref 12.0–15.0)
MCH: 31.6 pg (ref 26.0–34.0)
MCHC: 34.8 g/dL (ref 30.0–36.0)
MCV: 91 fL (ref 78.0–100.0)
Platelets: 420 10*3/uL — ABNORMAL HIGH (ref 150–400)
RBC: 3.35 MIL/uL — AB (ref 3.87–5.11)
RDW: 13.3 % (ref 11.5–15.5)
WBC: 41.6 10*3/uL — AB (ref 4.0–10.5)

## 2015-07-12 LAB — MAGNESIUM: Magnesium: 1.4 mg/dL — ABNORMAL LOW (ref 1.7–2.4)

## 2015-07-12 LAB — PHOSPHORUS: Phosphorus: 4.7 mg/dL — ABNORMAL HIGH (ref 2.5–4.6)

## 2015-07-12 MED ORDER — VITAMINS A & D EX OINT
TOPICAL_OINTMENT | CUTANEOUS | Status: AC
  Filled 2015-07-12: qty 5

## 2015-07-12 MED ORDER — ACETAMINOPHEN 10 MG/ML IV SOLN
1000.0000 mg | Freq: Once | INTRAVENOUS | Status: AC
  Filled 2015-07-12: qty 100

## 2015-07-12 NOTE — Progress Notes (Signed)
Wasted 3mg  of morphine from PCA with Geraldine Solar.  RN

## 2015-07-12 NOTE — Progress Notes (Addendum)
1 Day Post-Op  Subjective: Having issues with pain.  Had atrial fibrillation, rate controlled last night.  Objective: Vital signs in last 24 hours: Temp:  [97.5 F (36.4 C)-98.5 F (36.9 C)] 98.5 F (36.9 C) (09/09 0400) Pulse Rate:  [51-86] 51 (09/09 0600) Resp:  [10-20] 14 (09/09 0800) BP: (92-156)/(44-61) 120/56 mmHg (09/09 0600) SpO2:  [92 %-99 %] 94 % (09/09 0800) Last BM Date: 07/10/2015  Intake/Output from previous day: 09/08 0701 - 09/09 0700 In: 4320 [I.V.:4100; NG/GT:20; IV Piggyback:200] Out: 1405 [Urine:840; Emesis/NG output:50; Drains:315; Blood:200] Intake/Output this shift: Total I/O In: -  Out: 60 [Drains:60]  General appearance: alert, cooperative and mild distress Resp: breathing comfortably Cardio: regular rate and rhythm and bradycardic GI: soft, non distended, approp tender  Lab Results:   Recent Labs  07/10/15 0530 07/12/15 0342  WBC 9.0 41.6*  HGB 12.2 10.6*  HCT 34.9* 30.5*  PLT 440* 420*   BMET  Recent Labs  07/10/15 0530 07/12/15 0342  NA 140 137  K 3.7 4.9  CL 107 105  CO2 24 26  GLUCOSE 96 219*  BUN <5* 6  CREATININE 0.56 0.56  CALCIUM 8.0* 7.7*   PT/INR  Recent Labs  07/10/15 1145  LABPROT 13.7  INR 1.03   ABG No results for input(s): PHART, HCO3 in the last 72 hours.  Invalid input(s): PCO2, PO2  Studies/Results: No results found.  Anti-infectives: Anti-infectives    Start     Dose/Rate Route Frequency Ordered Stop   07/29/2015 2000  ceFAZolin (ANCEF) IVPB 2 g/50 mL premix     2 g 100 mL/hr over 30 Minutes Intravenous 3 times per day 07/22/2015 1628 07/22/2015 2349   07/13/2015 0600  ceFAZolin (ANCEF) IVPB 2 g/50 mL premix     2 g 100 mL/hr over 30 Minutes Intravenous On call to O.R. 07/10/15 0951 07/14/2015 1125      Assessment/Plan: s/p Procedure(s): partial colectomy, splenic flexure take down, partial gastrectomy (N/A) Continue foley due to urinary output monitoring STRICT NPO/NGT for gastric resection for  at least 4 days.  Will definitely do swallow study prior to d/c ngt and feeding Definitely residual cancer in abdomen.  Several nodules on bowel/stomach/peritoneum.   Not all resectable.  Unclear why WBCs so high today.  Recheck tomorrow.     LOS: 5 days    [facility] 07/12/2015

## 2015-07-12 NOTE — Progress Notes (Addendum)
Cardiology consulted for postop atrial fibrillation. Telemetry reviewed, she had baseline sinus tachycardia with HR 90-100 yesterday with frequent PACs which is likely related to surgery. The only episode that was not interpretable was 18:13:30 sec pm to 18:14:30pm due to motion artifact. I have reviewed the telemetry with Dr. Sallyanne Kuster, it is felt the episode was too brief runs of atrial tach in the background of sinus rhythm with PACs. There is no evidence of atrial fibrillation. Her HR did improve and PACs decreased after IV lopressor, expect HR and number of PACs continue to improve after stress from surgery.  Please contact cardiology for further questions.   Hilbert Corrigan PA Pager: [phone]    Reviewed all the telemetry events. There is no evidence of atrial fibrillation. There are frequent PACS on a background of appropriate sinus tachycardia. During the period with motion artifact (which is otherwise very brief), there may indeed have been a short burst of paroxysmal atrial tachycardia, but there is no atrial fibrillation.  Sanda Klein, MD, [facility] [phone] office [phone] pager

## 2015-07-13 LAB — BASIC METABOLIC PANEL
ANION GAP: 5 (ref 5–15)
BUN: 7 mg/dL (ref 6–20)
CALCIUM: 7.7 mg/dL — AB (ref 8.9–10.3)
CO2: 26 mmol/L (ref 22–32)
CREATININE: 0.79 mg/dL (ref 0.44–1.00)
Chloride: 107 mmol/L (ref 101–111)
GLUCOSE: 171 mg/dL — AB (ref 65–99)
Potassium: 5.2 mmol/L — ABNORMAL HIGH (ref 3.5–5.1)
Sodium: 138 mmol/L (ref 135–145)

## 2015-07-13 LAB — CBC
HEMATOCRIT: 31.1 % — AB (ref 36.0–46.0)
Hemoglobin: 10.8 g/dL — ABNORMAL LOW (ref 12.0–15.0)
MCH: 32.3 pg (ref 26.0–34.0)
MCHC: 34.7 g/dL (ref 30.0–36.0)
MCV: 93.1 fL (ref 78.0–100.0)
PLATELETS: 486 10*3/uL — AB (ref 150–400)
RBC: 3.34 MIL/uL — ABNORMAL LOW (ref 3.87–5.11)
RDW: 14.2 % (ref 11.5–15.5)
WBC: 33 10*3/uL — AB (ref 4.0–10.5)

## 2015-07-13 MED ORDER — SODIUM CHLORIDE 0.9 % IV SOLN
INTRAVENOUS | Status: DC
  Administered 2015-07-13: 1000 mL via INTRAVENOUS
  Administered 2015-07-13 – 2015-07-14 (×2): via INTRAVENOUS

## 2015-07-13 MED ORDER — PHENOL 1.4 % MT LIQD
1.0000 | OROMUCOSAL | Status: DC | PRN
  Filled 2015-07-13: qty 177

## 2015-07-13 NOTE — Progress Notes (Signed)
[place]. NO CHANGE IN HER CONDITION. REPORT WAS GIVEN TO 4E RN.

## 2015-07-13 NOTE — Progress Notes (Signed)
2 Days Post-Op partial colectomy, splenic flexure take down, partial gastrectomy  Subjective: Pain better controlled.  Throat is sore.  Foley out, passing flatus.  Cardiology states no A Fib noted on monitor, HR stable  Objective: Vital signs in last 24 hours: Temp:  [98.1 F (36.7 C)-99.7 F (37.6 C)] 98.4 F (36.9 C) (09/10 0400) Pulse Rate:  [54-86] 67 (09/10 0350) Resp:  [18-24] 24 (09/10 0400) BP: (106-126)/(35-46) 110/45 mmHg (09/10 0350) SpO2:  [90 %-97 %] 90 % (09/10 0400) Weight:  [85 kg (187 lb 6.3 oz)] 85 kg (187 lb 6.3 oz) (09/10 0400) Last BM Date: 07/30/2015  Intake/Output from previous day: 09/09 0701 - 09/10 0700 In: 1220 [I.V.:1100; NG/GT:20; IV Piggyback:100] Out: 1730 [Urine:850; Emesis/NG output:600; Drains:280] Intake/Output this shift:    General appearance: alert, cooperative and mild distress Resp: breathing comfortably Cardio: regular rate and rhythm and bradycardic GI: soft, non distended, approp tender  Lab Results:   Recent Labs  07/12/15 0342 07/13/15 0401  WBC 41.6* 33.0*  HGB 10.6* 10.8*  HCT 30.5* 31.1*  PLT 420* 486*   BMET  Recent Labs  07/12/15 0342 07/13/15 0401  NA 137 138  K 4.9 5.2*  CL 105 107  CO2 26 26  GLUCOSE 219* 171*  BUN 6 7  CREATININE 0.56 0.79  CALCIUM 7.7* 7.7*   PT/INR  Recent Labs  07/10/15 1145  LABPROT 13.7  INR 1.03   ABG No results for input(s): PHART, HCO3 in the last 72 hours.  Invalid input(s): PCO2, PO2  Studies/Results: No results found.  Anti-infectives: Anti-infectives    Start     Dose/Rate Route Frequency Ordered Stop   07/07/2015 2000  ceFAZolin (ANCEF) IVPB 2 g/50 mL premix     2 g 100 mL/hr over 30 Minutes Intravenous 3 times per day 07/17/2015 1628 07/31/2015 2349   07/17/2015 0600  ceFAZolin (ANCEF) IVPB 2 g/50 mL premix     2 g 100 mL/hr over 30 Minutes Intravenous On call to O.R. 07/10/15 0951 07/15/2015 1125      Assessment/Plan: s/p Procedure(s): partial colectomy,  splenic flexure take down, partial gastrectomy (N/A) Definitely residual cancer in abdomen.  Several nodules on bowel/stomach/peritoneum.  Not all resectable STRICT NPO/NGT for gastric resection for at least 4 days.  Will definitely do swallow study prior to d/c ngt and feeding Unclear why WBCs so high but trending down.  Recheck tomorrow.   UOP low, K trending up.  Will change fluids to NS and increase rate slightly, Recheck in AM   LOS: 6 days    Makyia Erxleben C. 8/88/2800

## 2015-07-14 ENCOUNTER — Inpatient Hospital Stay (HOSPITAL_COMMUNITY): Payer: Medicare Other

## 2015-07-14 LAB — URINALYSIS, ROUTINE W REFLEX MICROSCOPIC
Glucose, UA: NEGATIVE mg/dL
Hgb urine dipstick: NEGATIVE
Ketones, ur: NEGATIVE mg/dL
NITRITE: NEGATIVE
PROTEIN: NEGATIVE mg/dL
SPECIFIC GRAVITY, URINE: 1.017 (ref 1.005–1.030)
UROBILINOGEN UA: 0.2 mg/dL (ref 0.0–1.0)
pH: 5.5 (ref 5.0–8.0)

## 2015-07-14 LAB — CBC
HEMATOCRIT: 31.3 % — AB (ref 36.0–46.0)
HEMOGLOBIN: 10.7 g/dL — AB (ref 12.0–15.0)
MCH: 32.6 pg (ref 26.0–34.0)
MCHC: 34.2 g/dL (ref 30.0–36.0)
MCV: 95.4 fL (ref 78.0–100.0)
Platelets: 557 10*3/uL — ABNORMAL HIGH (ref 150–400)
RBC: 3.28 MIL/uL — ABNORMAL LOW (ref 3.87–5.11)
RDW: 15.1 % (ref 11.5–15.5)
WBC: 55.5 10*3/uL — AB (ref 4.0–10.5)

## 2015-07-14 LAB — URINE MICROSCOPIC-ADD ON

## 2015-07-14 LAB — BASIC METABOLIC PANEL
ANION GAP: 8 (ref 5–15)
BUN: 8 mg/dL (ref 6–20)
CHLORIDE: 110 mmol/L (ref 101–111)
CO2: 25 mmol/L (ref 22–32)
Calcium: 7.5 mg/dL — ABNORMAL LOW (ref 8.9–10.3)
Creatinine, Ser: 0.81 mg/dL (ref 0.44–1.00)
GFR calc non Af Amer: >60 mL/min (ref >60)
Glucose, Bld: 108 mg/dL — ABNORMAL HIGH (ref 65–99)
POTASSIUM: 5 mmol/L (ref 3.5–5.1)
Sodium: 143 mmol/L (ref 135–145)

## 2015-07-14 NOTE — Progress Notes (Signed)
WBC's this AM are 93.2 (were 33), Leighton Ruff on call this morning and have notified her and am waiting response.  Day RN, Cyril Mourning informed and will follow up.

## 2015-07-14 NOTE — Progress Notes (Signed)
Continues to have issues with uncontrolled pain.  On PCA but falls asleep then awakens with a lot of pain - reports 10/10.  Rescue IV Dilaudid given during these episodes but takes about half an hour to get pain back under control.   Would pt benefit from a low dose BASAL rate with PCA?

## 2015-07-14 NOTE — Progress Notes (Addendum)
3 Days Post-Op partial colectomy, splenic flexure take down, partial gastrectomy  Subjective: Pain better controlled.  Throat is sore, not coughing well.  Foley out, passing flatus.  Incontinent to urine several times yesterday.  HR stable  Objective: Vital signs in last 24 hours: Temp:  [98.3 F (36.8 C)-99.7 F (37.6 C)] 98.8 F (37.1 C) (09/11 0611) Pulse Rate:  [75-86] 86 (09/11 0611) Resp:  [18-32] 24 (09/11 0611) BP: (99-137)/(39-74) 123/74 mmHg (09/11 0611) SpO2:  [90 %-94 %] 91 % (09/11 0611) FiO2 (%):  [40 %] 40 % (09/11 0611) Last BM Date:  (pre surgery)  Intake/Output from previous day: 09/10 0701 - 09/11 0700 In: 2782.9 [I.V.:2782.9] Out: 488 [Urine:1; Emesis/NG output:275; Drains:212] Intake/Output this shift:    General appearance: alert, cooperative and mild distress Resp: breathing comfortably, course lung sounds Cardio: regular rate and rhythm  GI: soft, non distended, approp tender, no peritonitis  JP with dark output Incision: Clean, dry, intact  Lab Results:   Recent Labs  07/13/15 0401 07/14/15 0535  WBC 33.0* 55.5*  HGB 10.8* 10.7*  HCT 31.1* 31.3*  PLT 486* 557*   BMET  Recent Labs  07/13/15 0401 07/14/15 0535  NA 138 143  K 5.2* 5.0  CL 107 110  CO2 26 25  GLUCOSE 171* 108*  BUN 7 8  CREATININE 0.79 0.81  CALCIUM 7.7* 7.5*   PT/INR No results for input(s): LABPROT, INR in the last 72 hours. ABG No results for input(s): PHART, HCO3 in the last 72 hours.  Invalid input(s): PCO2, PO2  Studies/Results: No results found.  Anti-infectives: Anti-infectives    Start     Dose/Rate Route Frequency Ordered Stop   07/23/2015 2000  ceFAZolin (ANCEF) IVPB 2 g/50 mL premix     2 g 100 mL/hr over 30 Minutes Intravenous 3 times per day 07/27/2015 1628 07/06/2015 2349   07/24/2015 0600  ceFAZolin (ANCEF) IVPB 2 g/50 mL premix     2 g 100 mL/hr over 30 Minutes Intravenous On call to O.R. 07/10/15 0951 07/20/2015 1125      Assessment/Plan: s/p  Procedure(s): partial colectomy, splenic flexure take down, partial gastrectomy (N/A) Definitely residual cancer in abdomen.  Several nodules on bowel/stomach/peritoneum.  Not all resectable STRICT NPO/NGT for gastric resection for at least 4 days.  Will definitely do swallow study prior to d/c ngt and feeding Unclear why WBCs so high.  No fevers, severe abd pain, diarrhea.  HR and BP within normal limits.  Given poor pulmonary toilet, will check CXR.  Otherwise, no signs of infection noted.  Recheck CBC tomorrow.   Unable to assess UOP.  Strict I&O's.  Discussed with RN staff. Will cont NS fluids, Recheck in AM  Aggressive pulmonary toilet today.  OOB to chair, bedside commode    LOS: 7 days    Taila Basinski C. 1/70/0174

## 2015-07-15 ENCOUNTER — Inpatient Hospital Stay (HOSPITAL_COMMUNITY): Payer: Medicare Other

## 2015-07-15 ENCOUNTER — Encounter (HOSPITAL_COMMUNITY): Payer: Self-pay | Admitting: Radiology

## 2015-07-15 ENCOUNTER — Inpatient Hospital Stay: Admission: RE | Admit: 2015-07-15 | Payer: Medicare Other | Source: Ambulatory Visit

## 2015-07-15 DIAGNOSIS — J9601 Acute respiratory failure with hypoxia: Secondary | ICD-10-CM

## 2015-07-15 DIAGNOSIS — J9690 Respiratory failure, unspecified, unspecified whether with hypoxia or hypercapnia: Secondary | ICD-10-CM | POA: Insufficient documentation

## 2015-07-15 DIAGNOSIS — A419 Sepsis, unspecified organism: Secondary | ICD-10-CM | POA: Insufficient documentation

## 2015-07-15 DIAGNOSIS — R6521 Severe sepsis with septic shock: Secondary | ICD-10-CM

## 2015-07-15 LAB — CBC
HCT: 28.9 % — ABNORMAL LOW (ref 36.0–46.0)
Hemoglobin: 9.8 g/dL — ABNORMAL LOW (ref 12.0–15.0)
MCH: 32.1 pg (ref 26.0–34.0)
MCHC: 33.9 g/dL (ref 30.0–36.0)
MCV: 94.8 fL (ref 78.0–100.0)
PLATELETS: 612 10*3/uL — AB (ref 150–400)
RBC: 3.05 MIL/uL — AB (ref 3.87–5.11)
RDW: 15.3 % (ref 11.5–15.5)
WBC: 48.8 10*3/uL — AB (ref 4.0–10.5)

## 2015-07-15 LAB — TROPONIN I: TROPONIN I: 1.03 ng/mL — AB (ref <0.031)

## 2015-07-15 LAB — GLUCOSE, CAPILLARY: Glucose-Capillary: 271 mg/dL — ABNORMAL HIGH (ref 65–99)

## 2015-07-15 LAB — BLOOD GAS, ARTERIAL
ACID-BASE DEFICIT: 5.2 mmol/L — AB (ref 0.0–2.0)
Bicarbonate: 17.4 mEq/L — ABNORMAL LOW (ref 20.0–24.0)
Drawn by: 295031
FIO2: 1
LHR: 24 {breaths}/min
MECHVT: 500 mL
O2 SAT: 99.5 %
PATIENT TEMPERATURE: 98.6
PCO2 ART: 25.4 mmHg — AB (ref 35.0–45.0)
PEEP/CPAP: 5 cmH2O
PH ART: 7.451 — AB (ref 7.350–7.450)
PO2 ART: 230 mmHg — AB (ref 80.0–100.0)
TCO2: 16.2 mmol/L (ref 0–100)

## 2015-07-15 LAB — BASIC METABOLIC PANEL
ANION GAP: 10 (ref 5–15)
BUN: 19 mg/dL (ref 6–20)
CALCIUM: 7.6 mg/dL — AB (ref 8.9–10.3)
CO2: 24 mmol/L (ref 22–32)
Chloride: 112 mmol/L — ABNORMAL HIGH (ref 101–111)
Creatinine, Ser: 0.94 mg/dL (ref 0.44–1.00)
GFR, EST NON AFRICAN AMERICAN: 60 mL/min — AB (ref >60)
Glucose, Bld: 142 mg/dL — ABNORMAL HIGH (ref 65–99)
Potassium: 4.8 mmol/L (ref 3.5–5.1)
SODIUM: 146 mmol/L — AB (ref 135–145)

## 2015-07-15 LAB — LACTIC ACID, PLASMA
Lactic Acid, Venous: 3.7 mmol/L (ref 0.5–2.0)
Lactic Acid, Venous: 3.4 mmol/L (ref 0.5–2.0)

## 2015-07-15 LAB — MAGNESIUM: Magnesium: 2 mg/dL (ref 1.7–2.4)

## 2015-07-15 LAB — LIPASE, BLOOD

## 2015-07-15 LAB — PHOSPHORUS: Phosphorus: 3.4 mg/dL (ref 2.5–4.6)

## 2015-07-15 MED ORDER — FENTANYL CITRATE (PF) 100 MCG/2ML IJ SOLN: 50.0000 ug | Freq: Once | INTRAMUSCULAR | Status: DC

## 2015-07-15 MED ORDER — ALBUTEROL SULFATE (2.5 MG/3ML) 0.083% IN NEBU
2.5000 mg | INHALATION_SOLUTION | Freq: Four times a day (QID) | RESPIRATORY_TRACT | Status: DC
  Administered 2015-07-15 – 2015-07-18 (×12): 2.5 mg via RESPIRATORY_TRACT
  Filled 2015-07-15 (×12): qty 3

## 2015-07-15 MED ORDER — MIDAZOLAM HCL 2 MG/2ML IJ SOLN
1.0000 mg | INTRAMUSCULAR | Status: DC | PRN
  Administered 2015-07-16 – 2015-07-17 (×9): 1 mg via INTRAVENOUS
  Filled 2015-07-15 (×10): qty 2

## 2015-07-15 MED ORDER — IMIPENEM-CILASTATIN 500 MG IV SOLR
500.0000 mg | Freq: Three times a day (TID) | INTRAVENOUS | Status: DC
  Administered 2015-07-15 – 2015-07-17 (×7): 500 mg via INTRAVENOUS
  Filled 2015-07-15 (×7): qty 500

## 2015-07-15 MED ORDER — ANIDULAFUNGIN 100 MG IV SOLR
200.0000 mg | Freq: Once | INTRAVENOUS | Status: AC
  Administered 2015-07-15: 200 mg via INTRAVENOUS
  Filled 2015-07-15: qty 200

## 2015-07-15 MED ORDER — CHLORHEXIDINE GLUCONATE 0.12% ORAL RINSE (MEDLINE KIT)
15.0000 mL | Freq: Two times a day (BID) | OROMUCOSAL | Status: DC
  Administered 2015-07-15 – 2015-07-18 (×6): 15 mL via OROMUCOSAL

## 2015-07-15 MED ORDER — DEXTROSE IN LACTATED RINGERS 5 % IV SOLN
INTRAVENOUS | Status: DC
  Administered 2015-07-15 (×2): via INTRAVENOUS

## 2015-07-15 MED ORDER — INSULIN ASPART 100 UNIT/ML ~~LOC~~ SOLN
0.0000 [IU] | Freq: Four times a day (QID) | SUBCUTANEOUS | Status: DC
  Administered 2015-07-15: 5 [IU] via SUBCUTANEOUS
  Administered 2015-07-16: 9 [IU] via SUBCUTANEOUS

## 2015-07-15 MED ORDER — IPRATROPIUM-ALBUTEROL 0.5-2.5 (3) MG/3ML IN SOLN
3.0000 mL | RESPIRATORY_TRACT | Status: DC
  Filled 2015-07-15: qty 3

## 2015-07-15 MED ORDER — FENTANYL CITRATE (PF) 100 MCG/2ML IJ SOLN
50.0000 ug | INTRAMUSCULAR | Status: AC | PRN
  Administered 2015-07-15 – 2015-07-16 (×3): 50 ug via INTRAVENOUS
  Filled 2015-07-15 (×4): qty 2

## 2015-07-15 MED ORDER — BISACODYL 10 MG RE SUPP: 10.0000 mg | Freq: Every day | RECTAL | Status: DC | PRN

## 2015-07-15 MED ORDER — MIDAZOLAM HCL 2 MG/2ML IJ SOLN
1.0000 mg | INTRAMUSCULAR | Status: AC | PRN
  Administered 2015-07-15 – 2015-07-16 (×3): 1 mg via INTRAVENOUS
  Filled 2015-07-15 (×2): qty 2

## 2015-07-15 MED ORDER — IOHEXOL 300 MG/ML  SOLN
25.0000 mL | Freq: Once | INTRAMUSCULAR | Status: AC | PRN
  Administered 2015-07-15: 25 mL via ORAL

## 2015-07-15 MED ORDER — SUCCINYLCHOLINE CHLORIDE 20 MG/ML IJ SOLN
INTRAMUSCULAR | Status: AC
  Filled 2015-07-15: qty 1

## 2015-07-15 MED ORDER — ANIDULAFUNGIN 100 MG IV SOLR
100.0000 mg | INTRAVENOUS | Status: DC
  Administered 2015-07-16 – 2015-07-17 (×2): 100 mg via INTRAVENOUS
  Filled 2015-07-15 (×2): qty 100

## 2015-07-15 MED ORDER — DEXTROSE IN LACTATED RINGERS 5 % IV SOLN
INTRAVENOUS | Status: AC
  Administered 2015-07-15: 11:00:00 via INTRAVENOUS

## 2015-07-15 MED ORDER — ANTISEPTIC ORAL RINSE SOLUTION (CORINZ)
7.0000 mL | Freq: Four times a day (QID) | OROMUCOSAL | Status: DC
  Administered 2015-07-15 – 2015-07-18 (×10): 7 mL via OROMUCOSAL

## 2015-07-15 MED ORDER — ETOMIDATE 2 MG/ML IV SOLN
INTRAVENOUS | Status: AC
  Administered 2015-07-15: 20 mg
  Filled 2015-07-15: qty 20

## 2015-07-15 MED ORDER — LIDOCAINE HCL (CARDIAC) 20 MG/ML IV SOLN
INTRAVENOUS | Status: AC
  Filled 2015-07-15: qty 5

## 2015-07-15 MED ORDER — FENTANYL BOLUS VIA INFUSION
25.0000 ug | INTRAVENOUS | Status: DC | PRN
  Filled 2015-07-15: qty 25

## 2015-07-15 MED ORDER — IOHEXOL 300 MG/ML  SOLN
100.0000 mL | Freq: Once | INTRAMUSCULAR | Status: AC | PRN
  Administered 2015-07-15: 100 mL via INTRAVENOUS

## 2015-07-15 MED ORDER — FENTANYL CITRATE (PF) 100 MCG/2ML IJ SOLN
50.0000 ug | INTRAMUSCULAR | Status: DC | PRN
  Administered 2015-07-16 – 2015-07-17 (×9): 50 ug via INTRAVENOUS
  Filled 2015-07-15 (×8): qty 2

## 2015-07-15 MED ORDER — FENTANYL CITRATE (PF) 100 MCG/2ML IJ SOLN
INTRAMUSCULAR | Status: AC
  Administered 2015-07-15: 200 ug
  Filled 2015-07-15: qty 4

## 2015-07-15 MED ORDER — LINEZOLID 600 MG/300ML IV SOLN
600.0000 mg | Freq: Two times a day (BID) | INTRAVENOUS | Status: DC
  Administered 2015-07-15 – 2015-07-17 (×5): 600 mg via INTRAVENOUS
  Filled 2015-07-15 (×6): qty 300

## 2015-07-15 MED ORDER — MIDAZOLAM HCL 2 MG/2ML IJ SOLN
INTRAMUSCULAR | Status: AC
  Administered 2015-07-15: 4 mg
  Filled 2015-07-15: qty 4

## 2015-07-15 MED ORDER — ROCURONIUM BROMIDE 50 MG/5ML IV SOLN
INTRAVENOUS | Status: AC
  Administered 2015-07-15: 60 mg
  Filled 2015-07-15: qty 2

## 2015-07-15 MED ORDER — TRACE MINERALS CR-CU-MN-SE-ZN 10-1000-500-60 MCG/ML IV SOLN
INTRAVENOUS | Status: DC
  Administered 2015-07-15: 20:00:00 via INTRAVENOUS
  Filled 2015-07-15: qty 960

## 2015-07-15 MED ORDER — NOREPINEPHRINE BITARTRATE 1 MG/ML IV SOLN
0.0000 ug/min | INTRAVENOUS | Status: DC
  Administered 2015-07-15: 25 ug/min via INTRAVENOUS
  Administered 2015-07-15 (×2): 20 ug/min via INTRAVENOUS
  Administered 2015-07-15: 16 ug/min via INTRAVENOUS
  Administered 2015-07-16: 14 ug/min via INTRAVENOUS
  Filled 2015-07-15 (×7): qty 4

## 2015-07-15 MED ORDER — ALBUTEROL SULFATE (2.5 MG/3ML) 0.083% IN NEBU
INHALATION_SOLUTION | RESPIRATORY_TRACT | Status: AC
  Filled 2015-07-15: qty 3

## 2015-07-15 MED ORDER — LACTATED RINGERS IV BOLUS (SEPSIS)
1500.0000 mL | Freq: Once | INTRAVENOUS | Status: AC
  Administered 2015-07-15: 1500 mL via INTRAVENOUS

## 2015-07-15 MED ORDER — SODIUM CHLORIDE 0.9 % IV SOLN
25.0000 ug/h | INTRAVENOUS | Status: DC
  Administered 2015-07-15: 25 ug/h via INTRAVENOUS
  Administered 2015-07-15: 250 ug/h via INTRAVENOUS
  Administered 2015-07-17: 50 ug/h via INTRAVENOUS
  Filled 2015-07-15 (×5): qty 50

## 2015-07-15 NOTE — Progress Notes (Signed)
Initial Nutrition Assessment  DOCUMENTATION CODES:   Obesity unspecified  INTERVENTION:  - Will monitor for GOC - RD will continue to follow for needs based on GOC  NUTRITION DIAGNOSIS:   Inadequate oral intake related to inability to eat, altered GI function as evidenced by NPO status.  GOAL:   Patient will meet greater than or equal to 90% of their needs  MONITOR:   Vent status, Weight trends, Labs, I & O's  REASON FOR ASSESSMENT:   Malnutrition Screening Tool, Ventilator  ASSESSMENT:   She presents to the emergency department due to ongoing crampy abdominal pain, distention and nausea for the past week PTA. She underwent ex lap with partial colectomy, splenic flexure take down, partial gastrectomy  for small and large bowel obstruction  9/8.  Pt seen for MST and new vent. BMI indicates obesity. Pt has been NPO since admission (days). She is POD #4.  Pt was transferred to the ICU from [facility] this AM and was subsequently intubated due to respiratory failure.   Patient is currently intubated on ventilator support MV: 8.9 L/min Temp (24hrs), Avg:97.8 F (36.6 C), Min:97.6 F (36.4 C), Max:98.1 F (36.7 C)  Propofol: none  Family at bedside state that pt was complaining of abdominal discomfort after eating for the past two weeks. Pt had described this as a full feeling and that she felt maybe she was eating too much at at time. She then developed diarrhea which was not necessary associated with PO intakes.   Family feels that abdominal discomfort was going on for "a long time" prior to pt mentioning it to them. They state no recent weight changes and that pt had mentioned that she had not lost any weight despite eating less.   Will monitor for GOC, need for TPN. NP note from this AM indicates suspicion for anastomotic leak.  No muscle or fat wasting. Unable to meet needs. Medications reviewed. Labs reviewed; Na: 146 mmol/L, Cl: 112 mmol/L, Ca: 7.6 mg/dL.    Diet  Order:  Diet NPO time specified  Skin:  Wound (see comment) (abdominal surgical wound)  Last BM:  9/4  Height:   Ht Readings from Last 1 Encounters:  07/29/2015 5' (1.524 m)    Weight:   Wt Readings from Last 1 Encounters:  07/13/15 187 lb 6.3 oz (85 kg)    Ideal Body Weight:  45.45 kg (kg)  BMI:  Body mass index is 36.6 kg/(m^2).  Estimated Nutritional Needs:   Kcal:  1382  Protein:  103 grams protein  Fluid:  1.8-2 L/day  EDUCATION NEEDS:   No education needs identified at this time      Jarome Matin, RD, LDN Inpatient Clinical Dietitian Pager # [phone] After hours/weekend pager # [phone]

## 2015-07-15 NOTE — Progress Notes (Signed)
Sepsis - Repeat Assessment  Performed at: 1400  Vitals     Blood pressure 128/56, pulse 75, temperature 97.6 F (36.4 C), temperature source Axillary, resp. rate 16, height 5' (1.524 m), weight 85 kg (187 lb 6.3 oz), SpO2 100 %.  Heart:     Regular rate and rhythm  Lungs:    Rales  Capillary Refill:   <2 sec  Peripheral Pulse:   Radial pulse palpable  Skin:     Normal Color  LA clearing from 3.7 to 3.4.. Looks less mottled. CR brisk. Hemodynamically improved and no longer hypotensive.   Erick Colace ACNP-BC [place] Pager # [phone] OR # [phone] if no answer

## 2015-07-15 NOTE — Progress Notes (Signed)
PARENTERAL NUTRITION CONSULT NOTE - INITIAL  Pharmacy Consult for TPN Indication: ? Pancreatitis   Allergies  Allergen Reactions  . Tape     Allergic  To  Tegaderm.  . Codeine Nausea And Vomiting  . Lactose Intolerance (Gi)   . Tequin     Severe stomach pain  . Vancomycin Itching    Whelps on arm of Infusing IV site  . Penicillins Nausea Only and Rash    Patient Measurements: Height: 5' (152.4 cm) Weight: 187 lb 6.3 oz (85 kg) IBW/kg (Calculated) : 45.5 Adjusted Body Weight: 55 kg Usual Weight:  BMI: 36  Vital Signs: Temp: 97.6 F (36.4 C) (09/12 0801) Temp Source: Axillary (09/12 0801) BP: 128/56 mmHg (09/12 1400) Pulse Rate: 75 (09/12 1200) Intake/Output from previous day: 09/11 0701 - 09/12 0700 In: 2620.8 [I.V.:2620.8] Out: 1365 [Urine:1000; Emesis/NG output:225; Drains:140] Intake/Output from this shift: Total I/O In: 1108.2 [I.V.:708.2; IV Piggyback:400] Out: 340 [Urine:250; Emesis/NG output:90]  Labs:  Recent Labs  07/13/15 0401 07/14/15 0535 07/15/15 0459  WBC 33.0* 55.5* 48.8*  HGB 10.8* 10.7* 9.8*  HCT 31.1* 31.3* 28.9*  PLT 486* 557* 612*     Recent Labs  07/13/15 0401 07/14/15 0535 07/15/15 0459  NA 138 143 146*  K 5.2* 5.0 4.8  CL 107 110 112*  CO2 26 25 24   GLUCOSE 171* 108* 142*  BUN 7 8 19   CREATININE 0.79 0.81 0.94  CALCIUM 7.7* 7.5* 7.6*   Estimated Creatinine Clearance: 53.1 mL/min (by C-G formula based on Cr of 0.94).   No results for input(s): GLUCAP in the last 72 hours.  Medical History: Past Medical History  Diagnosis Date  . Macular degeneration   . Fluid retention   . Colitis, collagenous   . Vitamin D deficiency   . Osteopenia   . Celiac artery stenosis   . Splenic infarction   . Arthritis   . History of blood clots   . Anxiety   . Shingles   . Pancreatitis   . Osteoporosis   . Clotting disorder   . Arrhythmia     "skips a beat"  Labauer  heart  . GERD (gastroesophageal reflux disease)   . Breast  cancer     rt lumpectomy    Insulin Requirements: none ordered  Current Nutrition: NPO  IVF: D5LR at 125 ml/hr  Central access: CVC triple lumen placed 07/15/15 TPN start date: 9/12  ASSESSMENT                                                                                                          HPI: [name] with recurrent pancreatic cancer s/p exploratory laparotomy, partial colectomy, splenic flexure takedown, partial gastrectomy on 07/20/2015. On POD4, transferred to ICU for tachycardia, tachpnea, and hypotension. Feculent odor on breath and from abdominal drain. Concern for pneumonia, bowel leak but CT shows no leak so questioning pancreatitis.  Patient has been NPO since admission.  Will remain NPO and pharmacy consulted to start TPN.    Significant events:  9/12 transferred to ICU, intubated, CT  scan = no evidence of gastric or colonic leak.  No operative interventions at this time per surgery.  Today, 07/15/2015:    Glucose - no hx of DM, will order CBGs and SSI with start of TPN  Electrolytes - K+ wnl, check Mag and Phos.   Renal - SCr 0.94 this morning before decompensation (now on pressors)  LFTs - ordered for AM  TGs - ordered for AM  Prealbumin - ordered for AM  NUTRITIONAL GOALS                                                                                             RD recs: 1382 kcal, 103g protein, 1.8-2L fluid Will be unable to meet RD recommended goals of high protein, hypocaloric feeding with premixed Clinimix products.  PLAN                                                                                                                         At 1800 today:  Start Clinimix 5/15 at 40 ml/hr.  Will start electrolyte formulation as long as this morning's phosphorus level is not elevated.  F/u Phosphorus and Magnesium.  Replace as needed.  Hold lipid infusion for first 7 days of TPN in ICU.  Plan to advance as tolerated to the goal rate.  TPN to contain  standard multivitamins and trace elements.  Reduce IVF to 85 ml/hr.  F/u I/O.  Start sensitive SSI and CBG checks q6h.  TPN lab panels on Mondays & Thursdays.  MD has ordered BMET, renal function panel, and magnesium levels daily causing some duplications with TPN panel orders.  Will order a CMET for tomorrow AM.  Magnesium and Phosphorus ordered for AM already.  F/u daily.  Hershal Coria 07/15/2015,2:37 PM

## 2015-07-15 NOTE — Progress Notes (Signed)
Patient ID: Annette Beltran, female   DOB: 01-08-1946, 69 y.o.   MRN: [id number]   Reviewed CT scan with radiology.  No evidence for gastric leak or colonic leak.  ? Pancreatitis. Will send drain output for lipase. Will also check serum lipase.    Would not plan operative intervention at this time.    Ok to start SQ heparin.    Certainly drain output looks murky, but no contrast in drain.  Would do broad spectrum antibiotics including antifungals.   Would start TNA at this point and hold on enteric feeds.

## 2015-07-15 NOTE — Progress Notes (Signed)
ANTIBIOTIC CONSULT NOTE - INITIAL  Pharmacy Consult for Primaxin, Zyvox, Anidulafungin Indication: PNA, IAI  Allergies  Allergen Reactions  . Tape     Allergic  To  Tegaderm.  . Codeine Nausea And Vomiting  . Lactose Intolerance (Gi)   . Tequin     Severe stomach pain  . Vancomycin Itching    Whelps on arm of Infusing IV site  . Penicillins Nausea Only and Rash    Patient Measurements: Height: 5' (152.4 cm) Weight: 187 lb 6.3 oz (85 kg) IBW/kg (Calculated) : 45.5 Adjusted Body Weight:   Vital Signs: Temp: 97.6 F (36.4 C) (09/12 0801) Temp Source: Axillary (09/12 0801) BP: 143/79 mmHg (09/12 0801) Pulse Rate: 105 (09/12 0801) Intake/Output from previous day: 09/11 0701 - 09/12 0700 In: 2620.8 [I.V.:2620.8] Out: 1365 [Urine:1000; Emesis/NG output:225; Drains:140] Intake/Output from this shift:    Labs:  Recent Labs  07/13/15 0401 07/14/15 0535 07/15/15 0459  WBC 33.0* 55.5* 48.8*  HGB 10.8* 10.7* 9.8*  PLT 486* 557* 612*  CREATININE 0.79 0.81 0.94   Estimated Creatinine Clearance: 53.1 mL/min (by C-G formula based on Cr of 0.94). No results for input(s): VANCOTROUGH, VANCOPEAK, VANCORANDOM, GENTTROUGH, GENTPEAK, GENTRANDOM, TOBRATROUGH, TOBRAPEAK, TOBRARND, AMIKACINPEAK, AMIKACINTROU, AMIKACIN in the last 72 hours.   Microbiology: Recent Results (from the past 720 hour(s))  C difficile quick scan w PCR reflex     Status: None   Collection Time: 07/04/15 12:19 PM  Result Value Ref Range Status   C Diff antigen NEGATIVE NEGATIVE Final   C Diff toxin NEGATIVE NEGATIVE Final   C Diff interpretation Negative for toxigenic C. difficile  Final  Surgical pcr screen     Status: None   Collection Time: 07/10/15  6:03 PM  Result Value Ref Range Status   MRSA, PCR NEGATIVE NEGATIVE Final   Staphylococcus aureus NEGATIVE NEGATIVE Final    Comment:        The Xpert SA Assay (FDA approved for NASAL specimens in patients over 71 years of age), is one component  of a comprehensive surveillance program.  Test performance has been validated by [facility] for patients greater than or equal to 71 year old. It is not intended to diagnose infection nor to guide or monitor treatment.     Medical History: Past Medical History  Diagnosis Date  . Macular degeneration   . Fluid retention   . Colitis, collagenous   . Vitamin D deficiency   . Osteopenia   . Celiac artery stenosis   . Splenic infarction   . Arthritis   . History of blood clots   . Anxiety   . Shingles   . Pancreatitis   . Osteoporosis   . Clotting disorder   . Arrhythmia     "skips a beat"  Labauer  heart  . GERD (gastroesophageal reflux disease)   . Breast cancer     rt lumpectomy    Assessment: 69 yoF with recurrent pancreatic cancer s/p exploratory laparotomy, partial colectomy, splenic flexure takedown, partial gastrectomy on 07/12/2015.  Today, POD4, transferred to ICU for tachycardia, tachpnea, and hypotension. Feculent odor on breath and from abdominal drain.  Concern for pneumonia, bowel leak.  Dr. Barry Dienes ordered Primaxin and Vancomycin per pharmacy.  Patient had allergic reaction with whelps immediately following vancomycin dose in 10/2014.  Spoke with Dr. Barry Dienes, will change Vancomycin to Zyvox.  She also wanted fungal coverage so will add anidulafungin.    9/12 >> Primaxin >> 9/12 >> Zyvox >> 9/12 >>  Anidulafungin >>  Today, 07/15/2015: Currently afebrile WBC elevated SCr 0.94, CrCl~61N, ~76CG Patient intubated, norepinephrine started. CT abdomen ordered.  Goal of Therapy:  Doses adjusted per renal function Eradication of infection  Plan:  Primaxin 500 mg IV q8h Zyvox 600 mg IV q12h Andidulafungin 200 mg IV x 1 then 100 mg daily Following daily  Hershal Coria 07/15/2015,9:27 AM

## 2015-07-15 NOTE — Consult Note (Signed)
PULMONARY / CRITICAL CARE MEDICINE   Name: Annette Beltran MRN: [id number] DOB: 12/25/1945    ADMISSION DATE:  07/29/2015 CONSULTATION DATE:  9/12  REFERRING MD :  Barry Dienes   CHIEF COMPLAINT:  Acute respiratory failure   INITIAL PRESENTATION:  69 year old female w/ Recurrent pancreatic cancer now s/p  Exploratory laparotomy, partial colectomy, splenic flexure takedown, partial gastrectomy, 07/06/2015, Dr. Barry Dienes. Post-op course largely unremarkable w/ exception of leukocytosis until the AM of 9/12 when developed acute respiratory failure, delirium and subsequent shock. Feculent material draining from post-op drain. PCCM asked to assist w/ care.   STUDIES:  CT abd/pelvis 9/12>>>  SIGNIFICANT EVENTS: 9/8: Exploratory laparotomy, partial colectomy, splenic flexure takedown, partial gastrectomy 9/12:  developed acute respiratory failure, delirium and subsequent shock. Feculent material draining from post-op drain. PCCM asked to assist w/ care. Intubated. CVL placed. ABX widened Fluid challenged and CT abd ordered.    HISTORY OF PRESENT ILLNESS:   69 year old female w/ Recurrent pancreatic cancer now s/p  Exploratory laparotomy, partial colectomy, splenic flexure takedown, partial gastrectomy, 07/17/2015, Dr. Barry Dienes. Post-op course largely unremarkable w/ exception of leukocytosis until the AM of 9/12 when after getting up to commode developed acute respiratory distress w/ RR 40s and required venturi mask to keep sats >92%. On exam she had new feculent odor from her mouth and feculent material from her surgical drain. PCCM was asked to see as the pt was in acute distress w/ paradoxical respiratory pattern and marked accessory muscle use. She was emergently intubated and central access obtained to allow for further evaluation.   PAST MEDICAL HISTORY :   has a past medical history of Macular degeneration; Fluid retention; Colitis, collagenous; Vitamin D deficiency; Osteopenia; Celiac artery stenosis; Splenic  infarction; Arthritis; History of blood clots; Anxiety; Shingles; Pancreatitis; Osteoporosis; Clotting disorder; Arrhythmia; GERD (gastroesophageal reflux disease); and Breast cancer.  has past surgical history that includes Foot surgery (2003); Breast lumpectomy (Right); Meniscus repair (Right); EUS (N/A, 11/23/2013); Tonsillectomy; laparoscopy (N/A, 12/20/2013); Cholecystectomy (N/A, 12/20/2013); Splenectomy, total (N/A, 12/20/2013); Partial gastrectomy (N/A, 12/20/2013); Portacath placement (N/A, 01/15/2014); Flexible sigmoidoscopy (N/A, 07/24/2015); and laparotomy (N/A, 07/19/2015). Prior to Admission medications   Medication Sig Start Date End Date Taking? Authorizing Provider  ALPRAZolam Duanne Moron) 0.5 MG tablet Take 1 tablet (0.5 mg total) by mouth at bedtime as needed for anxiety. 01/15/15  Yes [name], MD  antiseptic oral rinse (BIOTENE) LIQD 15 mLs by Mouth Rinse route as needed for dry mouth.   Yes Historical Provider, MD  aspirin EC 81 MG tablet Take 81 mg by mouth every morning.    Yes Historical Provider, MD  b complex vitamins tablet Take 1 tablet by mouth every morning.    Yes Historical Provider, MD  Biotin 1 MG CAPS Take 1 capsule by mouth daily at 12 noon.    Yes Historical Provider, MD  CREON 12000 UNITS CPEP capsule TAKE TWO CAPSULES THREE TIMES DAILY WITH MEALS 01/09/15  Yes Chauncey Cruel, MD  diphenhydrAMINE (BENADRYL) 25 MG tablet Take 25 mg by mouth at bedtime as needed for sleep.    Yes Historical Provider, MD  loratadine (CLARITIN) 10 MG tablet Take 10 mg by mouth daily as needed for allergies.    Yes Historical Provider, MD  Multiple Vitamins-Minerals (PRESERVISION AREDS PO) Take 1 capsule by mouth 2 (two) times daily.   Yes Historical Provider, MD  omeprazole (PRILOSEC) 40 MG capsule Take 1 capsule (40 mg total) by mouth daily. 08/15/14  Yes Sarajane Jews  C Magrinat, MD  ondansetron (ZOFRAN) 8 MG tablet Take 1 tablet (8 mg total) by mouth every 8 (eight) hours as needed for nausea or  vomiting. 07/03/15  Yes Susanne Borders, NP  PARoxetine HCl (PAXIL PO) Take 1 tablet by mouth daily.   Yes Historical Provider, MD  PARoxetine (PAXIL) 20 MG tablet Take 2 tablets (40 mg total) by mouth daily. Patient not taking: Reported on 07/17/2015 03/23/15   Chauncey Cruel, MD   Allergies  Allergen Reactions  . Tape     Allergic  To  Tegaderm.  . Codeine Nausea And Vomiting  . Lactose Intolerance (Gi)   . Tequin     Severe stomach pain  . Vancomycin Itching    Whelps on arm of Infusing IV site  . Penicillins Nausea Only and Rash    FAMILY HISTORY:  indicated that her mother is deceased. She indicated that her father is deceased. She indicated that both of her brothers are alive. She indicated that her maternal grandmother is deceased. She indicated that her maternal grandfather is deceased. She indicated that her paternal grandmother is deceased. She indicated that her paternal grandfather is deceased. She indicated that her maternal aunt is deceased. She indicated that all of her three paternal aunts are deceased. She indicated that her other is deceased.  SOCIAL HISTORY:  reports that she quit smoking about 14 years ago. Her smoking use included Cigarettes. She has a 14 pack-year smoking history. She has never used smokeless tobacco. She reports that she drinks alcohol. She reports that she does not use illicit drugs.  REVIEW OF SYSTEMS:  Unable due to acute distress  SUBJECTIVE:  Acute resp distress.  VITAL SIGNS: Temp:  [97.6 F (36.4 C)-99 F (37.2 C)] 97.6 F (36.4 C) (09/12 0801) Pulse Rate:  [62-105] 74 (09/12 1115) Resp:  [18-40] 24 (09/12 1115) BP: (88-175)/(39-95) 96/44 mmHg (09/12 1115) SpO2:  [88 %-100 %] 100 % (09/12 1115) FiO2 (%):  [40 %-100 %] 60 % (09/12 1055) HEMODYNAMICS:   VENTILATOR SETTINGS: Vent Mode:  [-] PRVC FiO2 (%):  [40 %-100 %] 60 % Set Rate:  [24 bmp] 24 bmp Vt Set:  [500 mL] 500 mL PEEP:  [5 cmH20] 5 cmH20 Plateau Pressure:  [31 cmH20]  31 cmH20 INTAKE / OUTPUT:  Intake/Output Summary (Last 24 hours) at 07/15/15 1210 Last data filed at 07/15/15 0358  Gross per 24 hour  Intake 2620.83 ml  Output   1065 ml  Net 1555.83 ml    PHYSICAL EXAMINATION: General:  Acutely ill appearing 69 year old female, in acute distress on arrival of PCCM Neuro:  Awake, oriented. Anxious appropriately to situation no focal def  HEENT:  NCAT, now orally intubated w/ 7.5 ETT Cardiovascular:  Tachy rrr No MRG Lungs:  Scattered rhonchi, marked accessory muscle use prior to intubation  Abdomen:  Incision intact, not tender to palp, hypoactive BS; feculent material draining from JP drain  Musculoskeletal:  Intact  Skin:  Diaphoretic but intact   LABS:  CBC  Recent Labs Lab 07/13/15 0401 07/14/15 0535 07/15/15 0459  WBC 33.0* 55.5* 48.8*  HGB 10.8* 10.7* 9.8*  HCT 31.1* 31.3* 28.9*  PLT 486* 557* 612*   Coag's  Recent Labs Lab 07/10/15 1145  INR 1.03   BMET  Recent Labs Lab 07/13/15 0401 07/14/15 0535 07/15/15 0459  NA 138 143 146*  K 5.2* 5.0 4.8  CL 107 110 112*  CO2 26 25 24   BUN 7 8 19  CREATININE 0.79 0.81 0.94  GLUCOSE 171* 108* 142*   Electrolytes  Recent Labs Lab 07/12/15 0342 07/13/15 0401 07/14/15 0535 07/15/15 0459  CALCIUM 7.7* 7.7* 7.5* 7.6*  MG 1.4*  --   --   --   PHOS 4.7*  --   --   --    Sepsis Markers No results for input(s): LATICACIDVEN, PROCALCITON, O2SATVEN in the last 168 hours. ABG  Recent Labs Lab 07/15/15 0918  PHART 7.451*  PCO2ART 25.4*  PO2ART 230*   Liver Enzymes No results for input(s): AST, ALT, ALKPHOS, BILITOT, ALBUMIN in the last 168 hours. Cardiac Enzymes No results for input(s): TROPONINI, PROBNP in the last 168 hours. Glucose No results for input(s): GLUCAP in the last 168 hours.  Imaging Dg Chest Port 1 View  07/15/2015   CLINICAL DATA:  Central line placement.  EXAM: PORTABLE CHEST - 1 VIEW  COMPARISON:  07/14/2015  FINDINGS: The endotracheal to is 1  cm above the carina and could be retracted 2 cm. The NG tube is coursing down the esophagus and into the stomach. The right IJ central venous catheter tip is in the distal SVC. Persistent cardiac enlargement, tortuous calcified thoracic aorta and slightly prominent mediastinal contours. There are persistent bilateral pleural effusions and bilateral lower lobe airspace consolidation.  IMPRESSION: 1. The endotracheal tube is 1 cm above the carina. 2. The right IJ central venous catheter tip is in the distal SVC. 3. Persistent cardiac enlargement, bilateral pleural effusions and bilateral lower lobe airspace consolidation.   Electronically Signed   By: Marijo Sanes M.D.   On: 07/15/2015 10:26     ASSESSMENT / PLAN:  PULMONARY OETT 9/12>>> A: Intubated for acute respiratory distress in the setting of septic shock, and bibasilar airspace disease (ATX and effusion) Can't exclude aspiration event.  P:   Full vent support  PAD protocol  F/u CXR  CARDIOVASCULAR CVL right IJ CVL 9/12>>> A:  Septic shock: presume this is abd source due w/ feculent material now draining from JP.  P:  Stop antihypertensives Lactic acid sent.  30 ml/kg fluid challenge ordered.  Central access CVP monitoring  Repeat lactic acid and assessment s/p bolus  RENAL A:   Hypernatremia  At risk for AKI  P:   IV hydration  Renal dose meds  MAP goal >65  GASTROINTESTINAL A:   Recurrent pancreatic cancer with Exploratory laparotomy, partial colectomy, splenic flexure takedown, partial gastrectomy, 07/31/2015, Dr. Barry Dienes Concern for anastomotic leak P:   NPO  CT abd/pelvis PPI for SUP  HEMATOLOGIC A:  Severe leukocytosis  Anemia of critical illness  Thrombocytosis  P:  SCDs Then start Glenvil heparin when OK w/ surgical services   INFECTIOUS A:   Septic shock presume abd source  P:   BCx2 9/12>>> Sputum 9/12>>> Primaxin 9/12>>> linezolid 9/12>>> anidulafungin 9/12>>>  ENDOCRINE A:   Hyperglycemia  P:    ssi protocol   NEUROLOGIC A:   Post-op pain Acute encephalopathy  P:   RASS goal: -2 PAD protocol    FAMILY  - Updates: at bedside   - Inter-disciplinary family meet or Palliative Care meeting due by: 9/19    TODAY'S SUMMARY:  developed acute respiratory failure, delirium and subsequent shock. Feculent material draining from post-op drain. PCCM asked to assist w/ care. Intubated. CVL placed. ABX widened Fluid challenged and CT abd ordered. Suspect that this is an anastomotic leak.   Erick Colace ACNP-BC [place] Pager # [phone] OR # [phone] if no  answer    07/15/2015, 12:10 PM

## 2015-07-15 NOTE — Progress Notes (Signed)
Date:  Sept. 12, 2016 U.R. performed for needs and level of care. [id number] to icu due to resp failure nrb@40 % fI02 Will continue to follow for Case Management needs.  Velva Harman, RN, BSN, [place]   [phone]

## 2015-07-15 NOTE — Progress Notes (Signed)
4 Days Post-Op  Subjective: Rapid response altered mental status, wheezing respiratory rate is 40, BP up and tachycardic.  She has a feculent breath odor and the drainage from her abdominal drain is brownish colored but very thin with feculent odor also. The floor nurse said she got up to bathroom and was apparently fine before that, then after she got up she developed these changes and never improved.  She is on Venti FM, sat's 92 with HR 100's.  BP 170's right now  Objective: Vital signs in last 24 hours: Temp:  [97.6 F (36.4 C)-99 F (37.2 C)] 97.6 F (36.4 C) (09/12 0801) Pulse Rate:  [92-105] 105 (09/12 0801) Resp:  [18-40] 40 (09/12 0801) BP: (109-152)/(54-81) 143/79 mmHg (09/12 0801) SpO2:  [62 %-93 %] 89 % (09/12 0801) FiO2 (%):  [40 %] 40 % (09/12 0801) Last BM Date: 07/23/2015 NG 225 recorded yesterday 140 from the drain. NA 146, WBC 48.8, but was up to 55.5 yesterday. UA shows many WBC She had a L>R pleural effusion on film yesterday,  Intake/Output from previous day: 09/11 0701 - 09/12 0700 In: 2620.8 [I.V.:2620.8] Out: 1365 [Urine:1000; Emesis/NG output:225; Drains:140] Intake/Output this shift:    General appearance: severe distress and wheezing, RR 40's, confused. Resp: wheezes bilaterally GI: soft, not really tender, Wound intact, drainage is brownish in color and feculent odor.  Lab Results:   Recent Labs  07/14/15 0535 07/15/15 0459  WBC 55.5* 48.8*  HGB 10.7* 9.8*  HCT 31.3* 28.9*  PLT 557* 612*    BMET  Recent Labs  07/14/15 0535 07/15/15 0459  NA 143 146*  K 5.0 4.8  CL 110 112*  CO2 25 24  GLUCOSE 108* 142*  BUN 8 19  CREATININE 0.81 0.94  CALCIUM 7.5* 7.6*   PT/INR No results for input(s): LABPROT, INR in the last 72 hours.  No results for input(s): AST, ALT, ALKPHOS, BILITOT, PROT, ALBUMIN in the last 168 hours.   Lipase     Component Value Date/Time   LIPASE <10* 07/15/2015 1855     Studies/Results: Dg Chest Port 1  View  07/14/2015   CLINICAL DATA:  69 year old female with a history of shortness of breath, fever  EXAM: PORTABLE CHEST - 1 VIEW  COMPARISON:  PET-CT 05/13/2015, 11/20/2014, chest x-ray 11/08/2014  FINDINGS: Cardiomediastinal silhouette partially obscured by overlying lung/ pleural disease.  Since the prior plain film 11/08/2014, there has been interval development of bibasilar opacity obscuring the left greater than right hemidiaphragm and heart borders. Ill-defined interstitial opacities.  Interval placement of gastric tube.  No displaced fracture.  IMPRESSION: Interval development of left greater than right pleural effusions with associated interstitial and airspace disease concerning for multifocal infection and parapneumonic effusion. Given the oncologic history, follow-up PA and lateral chest X-ray is recommended in 3-4 weeks following trial of antibiotic therapy to ensure resolution and exclude underlying malignancy.  Interval placement of gastric tube.  Signed,  Dulcy Fanny. Earleen Newport, DO  [facility]  [facility]   Electronically Signed   By: Corrie Mckusick D.O.   On: 07/14/2015 12:16    Medications: . ipratropium-albuterol  3 mL Nebulization STAT  . morphine   Intravenous 6 times per day  . pantoprazole (PROTONIX) IV  40 mg Intravenous QHS    Assessment/Plan Acute respiratory distress Sepsis with elevated WBC Recurrent pancreatic cancer with Exploratory laparotomy, partial colectomy, splenic flexure takedown, partial gastrectomy, 07/17/2015, Dr. Barry Dienes.   Plan:  I have ask CCM to  see and they are going to intubate her now, and place a central line.  She lost her IV this AM.  We will add broad spectrum antibiotics.  Dr. Barry Dienes and DR. Marcello Moores have come to assist. Dr. Barry Dienes will take over care at this point.    LOS: 8 days    Kimimila Tauzin 07/15/2015

## 2015-07-15 NOTE — Progress Notes (Signed)
Patient ID: Annette Beltran, female   DOB: 14-Dec-1945, 69 y.o.   MRN: [id number] 4 Days Post-Op partial colectomy, splenic flexure take down, partial gastrectomy   Subjective: Transferred back to ICU for tachycardia, tachpnea, and hypotension.    Objective: Vital signs in last 24 hours: Temp:  [97.6 F (36.4 C)-99 F (37.2 C)] 97.6 F (36.4 C) (09/12 0801) Pulse Rate:  [92-105] 105 (09/12 0801) Resp:  [18-40] 40 (09/12 0801) BP: (109-152)/(54-81) 143/79 mmHg (09/12 0801) SpO2:  [62 %-93 %] 89 % (09/12 0801) FiO2 (%):  [40 %] 40 % (09/12 0801) Last BM Date: 07/20/2015  Intake/Output from previous day: 09/11 0701 - 09/12 0700 In: 2620.8 [I.V.:2620.8] Out: 1365 [Urine:1000; Emesis/NG output:225; Drains:140] Intake/Output this shift:    General appearance: disoriented, lethargic.   Resp: tachypneic Cardio: tachycardic GI: soft, non distended, no real change in mild tenderness.   JP Murky, foul smelling Incision: no erythema  Lab Results:   Recent Labs  07/14/15 0535 07/15/15 0459  WBC 55.5* 48.8*  HGB 10.7* 9.8*  HCT 31.3* 28.9*  PLT 557* 612*   BMET  Recent Labs  07/14/15 0535 07/15/15 0459  NA 143 146*  K 5.0 4.8  CL 110 112*  CO2 25 24  GLUCOSE 108* 142*  BUN 8 19  CREATININE 0.81 0.94  CALCIUM 7.5* 7.6*   PT/INR No results for input(s): LABPROT, INR in the last 72 hours. ABG No results for input(s): PHART, HCO3 in the last 72 hours.  Invalid input(s): PCO2, PO2  Studies/Results: Dg Chest Port 1 View  07/14/2015   CLINICAL DATA:  69 year old female with a history of shortness of breath, fever  EXAM: PORTABLE CHEST - 1 VIEW  COMPARISON:  PET-CT 05/13/2015, 11/20/2014, chest x-ray 11/08/2014  FINDINGS: Cardiomediastinal silhouette partially obscured by overlying lung/ pleural disease.  Since the prior plain film 11/08/2014, there has been interval development of bibasilar opacity obscuring the left greater than right hemidiaphragm and heart borders.  Ill-defined interstitial opacities.  Interval placement of gastric tube.  No displaced fracture.  IMPRESSION: Interval development of left greater than right pleural effusions with associated interstitial and airspace disease concerning for multifocal infection and parapneumonic effusion. Given the oncologic history, follow-up PA and lateral chest X-ray is recommended in 3-4 weeks following trial of antibiotic therapy to ensure resolution and exclude underlying malignancy.  Interval placement of gastric tube.  Signed,  Dulcy Fanny. Earleen Newport, DO  [facility]  [facility]   Electronically Signed   By: Corrie Mckusick D.O.   On: 07/14/2015 12:16    Anti-infectives: Anti-infectives    Start     Dose/Rate Route Frequency Ordered Stop   07/07/2015 2000  ceFAZolin (ANCEF) IVPB 2 g/50 mL premix     2 g 100 mL/hr over 30 Minutes Intravenous 3 times per day 08/01/2015 1628 07/13/2015 2349   07/04/2015 0600  ceFAZolin (ANCEF) IVPB 2 g/50 mL premix     2 g 100 mL/hr over 30 Minutes Intravenous On call to O.R. 07/10/15 0951 07/12/2015 1125      Assessment/Plan: s/p Procedure(s): partial colectomy, splenic flexure take down, partial gastrectomy (N/A) Definitely residual cancer in abdomen.  Several nodules on bowel/stomach/peritoneum.  Not all resectable STRICT NPO/NGT for gastric resection for at least 4 days.    Suspect leak, though CXR yesterday showed infiltrates.  Will cover for PNA and possible leak. CC working on access Will likely need intubation, possible pressors for sepsis. Will start broad spectrum antibiotics.  Discussed with Marni Griffon, NP, and the patient's husband.   May require takeback for leak.  Getting CT scan.     LOS: 8 days    [facility] 07/15/2015

## 2015-07-16 ENCOUNTER — Inpatient Hospital Stay (HOSPITAL_COMMUNITY): Payer: Medicare Other

## 2015-07-16 DIAGNOSIS — R740 Nonspecific elevation of levels of transaminase and lactic acid dehydrogenase [LDH]: Secondary | ICD-10-CM

## 2015-07-16 DIAGNOSIS — R7401 Elevation of levels of liver transaminase levels: Secondary | ICD-10-CM | POA: Insufficient documentation

## 2015-07-16 DIAGNOSIS — R0602 Shortness of breath: Secondary | ICD-10-CM

## 2015-07-16 LAB — PHOSPHORUS: Phosphorus: 1.4 mg/dL — ABNORMAL LOW (ref 2.5–4.6)

## 2015-07-16 LAB — CBC WITH DIFFERENTIAL/PLATELET
BASOS ABS: 0 10*3/uL (ref 0.0–0.1)
Basophils Relative: 0 % (ref 0–1)
EOS PCT: 0 % (ref 0–5)
Eosinophils Absolute: 0.1 10*3/uL (ref 0.0–0.7)
HCT: 25.5 % — ABNORMAL LOW (ref 36.0–46.0)
Hemoglobin: 8.9 g/dL — ABNORMAL LOW (ref 12.0–15.0)
LYMPHS PCT: 3 % — AB (ref 12–46)
Lymphs Abs: 0.9 10*3/uL (ref 0.7–4.0)
MCH: 32 pg (ref 26.0–34.0)
MCHC: 34.9 g/dL (ref 30.0–36.0)
MCV: 91.7 fL (ref 78.0–100.0)
MONO ABS: 1.2 10*3/uL — AB (ref 0.1–1.0)
MONOS PCT: 4 % (ref 3–12)
Neutro Abs: 31.6 10*3/uL — ABNORMAL HIGH (ref 1.7–7.7)
Neutrophils Relative %: 93 % — ABNORMAL HIGH (ref 43–77)
PLATELETS: 549 10*3/uL — AB (ref 150–400)
RBC: 2.78 MIL/uL — ABNORMAL LOW (ref 3.87–5.11)
RDW: 14.9 % (ref 11.5–15.5)
WBC: 33.8 10*3/uL — ABNORMAL HIGH (ref 4.0–10.5)

## 2015-07-16 LAB — GLUCOSE, CAPILLARY
GLUCOSE-CAPILLARY: 334 mg/dL — AB (ref 65–99)
GLUCOSE-CAPILLARY: 270 mg/dL — AB (ref 65–99)
GLUCOSE-CAPILLARY: 254 mg/dL — AB (ref 65–99)
GLUCOSE-CAPILLARY: 228 mg/dL — AB (ref 65–99)
GLUCOSE-CAPILLARY: 226 mg/dL — AB (ref 65–99)
GLUCOSE-CAPILLARY: 123 mg/dL — AB (ref 65–99)
GLUCOSE-CAPILLARY: 107 mg/dL — AB (ref 65–99)
GLUCOSE-CAPILLARY: 96 mg/dL (ref 65–99)
Glucose-Capillary: 370 mg/dL — ABNORMAL HIGH (ref 65–99)
Glucose-Capillary: 352 mg/dL — ABNORMAL HIGH (ref 65–99)
Glucose-Capillary: 340 mg/dL — ABNORMAL HIGH (ref 65–99)
Glucose-Capillary: 298 mg/dL — ABNORMAL HIGH (ref 65–99)
Glucose-Capillary: 363 mg/dL — ABNORMAL HIGH (ref 65–99)
Glucose-Capillary: 158 mg/dL — ABNORMAL HIGH (ref 65–99)
Glucose-Capillary: 171 mg/dL — ABNORMAL HIGH (ref 65–99)
Glucose-Capillary: 197 mg/dL — ABNORMAL HIGH (ref 65–99)
Glucose-Capillary: 116 mg/dL — ABNORMAL HIGH (ref 65–99)

## 2015-07-16 LAB — COMPREHENSIVE METABOLIC PANEL
ALBUMIN: 1.5 g/dL — AB (ref 3.5–5.0)
ALK PHOS: 80 U/L (ref 38–126)
ALT: 342 U/L — ABNORMAL HIGH (ref 14–54)
AST: 1078 U/L — AB (ref 15–41)
Anion gap: 6 (ref 5–15)
BILIRUBIN TOTAL: 0.8 mg/dL (ref 0.3–1.2)
BUN: 21 mg/dL — AB (ref 6–20)
CALCIUM: 7.2 mg/dL — AB (ref 8.9–10.3)
CO2: 23 mmol/L (ref 22–32)
Chloride: 107 mmol/L (ref 101–111)
Creatinine, Ser: 0.93 mg/dL (ref 0.44–1.00)
GFR calc Af Amer: >60 mL/min (ref >60)
GFR calc non Af Amer: >60 mL/min (ref >60)
GLUCOSE: 379 mg/dL — AB (ref 65–99)
POTASSIUM: 3.8 mmol/L (ref 3.5–5.1)
Sodium: 136 mmol/L (ref 135–145)
TOTAL PROTEIN: 4.5 g/dL — AB (ref 6.5–8.1)

## 2015-07-16 LAB — TROPONIN I
TROPONIN I: 2.24 ng/mL — AB (ref <0.031)
Troponin I: 1.83 ng/mL (ref <0.031)

## 2015-07-16 LAB — MAGNESIUM: Magnesium: 1.9 mg/dL (ref 1.7–2.4)

## 2015-07-16 LAB — LACTIC ACID, PLASMA
Lactic Acid, Venous: 2.8 mmol/L (ref 0.5–2.0)
Lactic Acid, Venous: 1.9 mmol/L (ref 0.5–2.0)

## 2015-07-16 LAB — TRIGLYCERIDES: Triglycerides: 97 mg/dL (ref <150)

## 2015-07-16 LAB — PREALBUMIN: Prealbumin: <2 mg/dL — ABNORMAL LOW (ref 18–38)

## 2015-07-16 MED ORDER — SODIUM PHOSPHATE 3 MMOLE/ML IV SOLN
30.0000 mmol | Freq: Once | INTRAVENOUS | Status: AC
  Administered 2015-07-16: 30 mmol via INTRAVENOUS
  Filled 2015-07-16: qty 10

## 2015-07-16 MED ORDER — DEXTROSE 10 % IV SOLN: INTRAVENOUS | Status: DC | PRN

## 2015-07-16 MED ORDER — INSULIN ASPART 100 UNIT/ML ~~LOC~~ SOLN
2.0000 [IU] | SUBCUTANEOUS | Status: DC
  Administered 2015-07-17 (×3): 2 [IU] via SUBCUTANEOUS

## 2015-07-16 MED ORDER — SODIUM CHLORIDE 0.9 % IV SOLN
0.0000 ug/min | INTRAVENOUS | Status: DC
  Filled 2015-07-16: qty 4

## 2015-07-16 MED ORDER — TRACE MINERALS CR-CU-MN-SE-ZN 10-1000-500-60 MCG/ML IV SOLN
INTRAVENOUS | Status: DC
  Administered 2015-07-16: 17:00:00 via INTRAVENOUS
  Filled 2015-07-16: qty 720

## 2015-07-16 MED ORDER — POTASSIUM CHLORIDE 10 MEQ/50ML IV SOLN
10.0000 meq | INTRAVENOUS | Status: AC
  Administered 2015-07-16 (×2): 10 meq via INTRAVENOUS
  Filled 2015-07-16 (×2): qty 50

## 2015-07-16 MED ORDER — TRACE MINERALS CR-CU-MN-SE-ZN 10-1000-500-60 MCG/ML IV SOLN: INTRAVENOUS | Status: AC

## 2015-07-16 MED ORDER — LACTATED RINGERS IV SOLN
INTRAVENOUS | Status: DC
  Administered 2015-07-16: 13:00:00 via INTRAVENOUS

## 2015-07-16 MED ORDER — SODIUM CHLORIDE 0.9 % IV SOLN
INTRAVENOUS | Status: DC
  Administered 2015-07-16: 3.1 [IU]/h via INTRAVENOUS
  Filled 2015-07-16: qty 2.5

## 2015-07-16 NOTE — Progress Notes (Signed)
PULMONARY / CRITICAL CARE MEDICINE   Name: Annette Beltran MRN: [id number] DOB: May 25, 1946    ADMISSION DATE:  07/20/2015 CONSULTATION DATE:  9/12  REFERRING MD :  Barry Dienes   CHIEF COMPLAINT:  Acute respiratory failure   INITIAL PRESENTATION:  69 year old female w/ Recurrent pancreatic cancer now s/p  Exploratory laparotomy, partial colectomy, splenic flexure takedown, partial gastrectomy, 07/31/2015, Dr. Barry Dienes. Post-op course largely unremarkable w/ exception of leukocytosis until the AM of 9/12 when developed acute respiratory failure, delirium and subsequent shock. Feculent material draining from post-op drain. PCCM asked to assist w/ care.   STUDIES:  CT abd/pelvis 9/12: 1. No evidence for contrast extravasation from the stomach to suggest gastric leak. Careful evaluation of the surgical drain reveals no high attenuation material within the actual lumen of the drain (best evaluated on coronal reformations) to suggest evacuation of a contrast leak through the drainage catheter.2. No evidence for wall thickening in the region of the colonic anastomosis. There is no fluid or gas accumulation in the region of the colonic anastomosis to suggest leak. 3. Status post distal pancreatectomy. No evidence for edema or inflammation in the retroperitoneal tissues along the pancreatic clips. 4. Bilateral lower lobe collapse/consolidation with small to moderate bilateral pleural effusions. Bilateral pneumonia could certainly have this appearance. 5. Moderate intraperitoneal fluid, mainly collected around the liver in the abdomen and in the cul-de-sac of the pelvis. No clear etiology for the fluid.  SIGNIFICANT EVENTS: 9/8: Exploratory laparotomy, partial colectomy, splenic flexure takedown, partial gastrectomy 9/12:  developed acute respiratory failure, delirium and subsequent shock. Feculent material draining from post-op drain. PCCM asked to assist w/ care. Intubated. CVL placed. ABX widened Fluid challenged  and CT abd ordered.  9/13: CT results w/out leak. Pressors weaning. FIO2 weaning. TNA started. WBCs trending down. Stopping fent gtt to see how much is sedation vs residual sepsis driving Levo requirements.   SUBJECTIVE:  Acute resp distress.  VITAL SIGNS: Temp:  [97.6 F (36.4 C)-99.7 F (37.6 C)] 99.7 F (37.6 C) (09/13 0400) Pulse Rate:  [46-90] 86 (09/13 0600) Resp:  [16-25] 16 (09/13 0600) BP: (86-138)/(35-95) 97/48 mmHg (09/13 0630) SpO2:  [92 %-100 %] 92 % (09/13 0821) FiO2 (%):  [40 %-100 %] 40 % (09/13 0821) HEMODYNAMICS: CVP:  [18 mmHg-19 mmHg] 18 mmHg VENTILATOR SETTINGS: Vent Mode:  [-] PRVC FiO2 (%):  [40 %-100 %] 40 % Set Rate:  [16 bmp-24 bmp] 16 bmp Vt Set:  [500 mL] 500 mL PEEP:  [5 cmH20] 5 cmH20 Plateau Pressure:  [30 cmH20-36 cmH20] 36 cmH20 INTAKE / OUTPUT:  Intake/Output Summary (Last 24 hours) at 07/16/15 0911 Last data filed at 07/16/15 0654  Gross per 24 hour  Intake   4726 ml  Output   1235 ml  Net   3491 ml    PHYSICAL EXAMINATION: General:  Sedated on vent  Neuro:  sedated HEENT:  NCAT, now orally intubated w/ 7.5 ETT Cardiovascular:  Tachy rrr No MRG Lungs:  Prolonged exp wheeze Abdomen:  Incision intact, not tender to palp, hypoactive BS; feculent material draining from JP drain -->still Musculoskeletal:  Intact  Skin:  Diaphoretic but intact   LABS:  CBC  Recent Labs Lab 07/14/15 0535 07/15/15 0459 07/16/15 0515  WBC 55.5* 48.8* 33.8*  HGB 10.7* 9.8* 8.9*  HCT 31.3* 28.9* 25.5*  PLT 557* 612* 549*   Coag's  Recent Labs Lab 07/10/15 1145  INR 1.03   BMET  Recent Labs Lab 07/14/15 0535 07/15/15 0459 07/16/15  0515  NA 143 146* 136  K 5.0 4.8 3.8  CL 110 112* 107  CO2 25 24 23   BUN 8 19 21*  CREATININE 0.81 0.94 0.93  GLUCOSE 108* 142* 379*   Electrolytes  Recent Labs Lab 07/12/15 0342  07/14/15 0535 07/15/15 0459 07/16/15 0515  CALCIUM 7.7*  < > 7.5* 7.6* 7.2*  MG 1.4*  --   --  2.0 1.9  PHOS 4.7*   --   --  3.4 1.4*  < > = values in this interval not displayed. Sepsis Markers  Recent Labs Lab 07/15/15 1100 07/15/15 1345  LATICACIDVEN 3.7* 3.4*   ABG  Recent Labs Lab 07/15/15 0918  PHART 7.451*  PCO2ART 25.4*  PO2ART 230*   Liver Enzymes  Recent Labs Lab 07/16/15 0515  AST 1078*  ALT 342*  ALKPHOS 80  BILITOT 0.8  ALBUMIN 1.5*   Cardiac Enzymes  Recent Labs Lab 07/15/15 0835  TROPONINI 1.03*   Glucose  Recent Labs Lab 07/16/15 0336 07/16/15 0444 07/16/15 0548 07/16/15 0651 07/16/15 0757 07/16/15 0904  GLUCAP 370* 352* 340* 334* 298* 270*    Imaging Ct Abdomen Pelvis W Contrast  07/15/2015   CLINICAL DATA:  Subsequent encounter for 69 year old female with history of pancreatic cancer, status post distal pancreatectomy and splenectomy with partial gastrectomy 1 year ago. Patient more recently presented with colonic obstruction secondary to recurrent pancreatic cancer. Status post exploratory laparotomy with partial colectomy, splenic flexure takedown, and partial gastrectomy on 07/21/2015. Now with leukocytosis and acute respiratory failure.  EXAM: CT ABDOMEN AND PELVIS WITH CONTRAST  TECHNIQUE: Multidetector CT imaging of the abdomen and pelvis was performed using the standard protocol following bolus administration of intravenous contrast.  CONTRAST:  13mL OMNIPAQUE IOHEXOL 300 MG/ML  SOLN  COMPARISON:  07/12/2015  FINDINGS: Lower chest: Imaging through the lower chest shows bilateral lower lobe collapse/consolidation with small to moderate bilateral pleural effusions.  Hepatobiliary: Diffuse low-attenuation of the liver parenchyma is again noted, compatible with steatosis. No focal or discrete intrahepatic parenchymal abnormality. Gallbladder is surgically absent. No intrahepatic or extrahepatic biliary dilation.  Pancreas: The patient is again noted be status post distal pancreatectomy. Pancreatic head has normal CT imaging features. There is no substantial  edema or inflammation within the anterior para renal space.  Spleen: Surgically absent.  Adrenals/Urinary Tract: No adrenal nodule or mass. No evidence for hydronephrosis. No enhancing lesion in either kidney. No hydroureter. Foley catheter decompresses the urinary bladder and gas within the bladder is compatible with the presence of the Foley device.  Stomach/Bowel: Stomach is nondistended. NG tube tip is in the region of the gastric antrum. Deformity and wall irregularity in the posterior aspect the proximal stomach is associated with surgical suture, compatible with the recent surgery/resection. There is no evidence for gross extravasation of enteric contrast from the posterior stomach. Careful evaluation of the Blake drain (best evaluated on coronal reformations) shows low attenuation in the lumen without convincing evidence for contrast material within the surgical drain. Duodenum is nondilated. There is some oral contrast material in proximal to mid small bowel which is nondistended. The terminal ileum is normal. The appendix is not visualized, but there is no edema or inflammation in the region of the cecum.  The colon is collapsed throughout. A colonic suture line is seen in the left paramidline abdomen compatible with side to side anastomosis after splenic flexure takedown. There is no edema, fluid, or inflammation immediately adjacent to the colonic anastomosis. No gas is seen within  the pericolonic fat in the region of the anastomosis.  Vascular/Lymphatic: There is abdominal aortic atherosclerosis without aneurysm. There is no gastrohepatic or hepatoduodenal ligament lymphadenopathy. No intraperitoneal or retroperitoneal lymphadenopy. No pelvic sidewall lymphadenopathy.  Reproductive: Uterus is unremarkable.  There is no adnexal mass.  Other: There is a small to moderate amount of intraperitoneal free fluid. In the abdomen, much of the fluid is seen around the liver and an area anterior to the lateral  segment of the left liver (image 23 series 2) measures 4.1 x 8.5 cm and contains gas. Gas is also seen in the fluid adjacent to the right liver in just inferior to the tip of the liver. Small volume of fluid is seen each paracolic gutter. There is also free fluid in the anatomic pelvis.  Moderate free air is seen in the abdomen with a right-sided predominance. Some loculation are seen scattered diffusely in the mesentery of the mid and upper abdomen.  Musculoskeletal: Bone windows reveal no worrisome lytic or sclerotic osseous lesions.  IMPRESSION: 1. No evidence for contrast extravasation from the stomach to suggest gastric leak. Careful evaluation of the surgical drain reveals no high attenuation material within the actual lumen of the drain (best evaluated on coronal reformations) to suggest evacuation of a contrast leak through the drainage catheter. 2. No evidence for wall thickening in the region of the colonic anastomosis. There is no fluid or gas accumulation in the region of the colonic anastomosis to suggest leak. 3. Status post distal pancreatectomy. No evidence for edema or inflammation in the retroperitoneal tissues along the pancreatic clips. 4. Bilateral lower lobe collapse/consolidation with small to moderate bilateral pleural effusions. Bilateral pneumonia could certainly have this appearance. 5. Moderate intraperitoneal fluid, mainly collected around the liver in the abdomen and in the cul-de-sac of the pelvis. No clear etiology for the fluid. 6. Small to moderate intraperitoneal free air. The volume of gas is not unexpected 4 days out from surgery and while somewhat prominent, is within the spectrum of expected findings on postoperative day 4 from exploratory laparotomy. 7. No evidence at this time of an organized or rim enhancing collection of fluid/gas to suggest evolving abscess. 8. I discussed these findings by telephone with Dr.Byerly at approximately 1330 hours on 07/15/2015.    Electronically Signed   By: Misty Stanley M.D.   On: 07/15/2015 13:36   Dg Chest Port 1 View  07/15/2015   CLINICAL DATA:  Central line placement.  EXAM: PORTABLE CHEST - 1 VIEW  COMPARISON:  07/14/2015  FINDINGS: The endotracheal to is 1 cm above the carina and could be retracted 2 cm. The NG tube is coursing down the esophagus and into the stomach. The right IJ central venous catheter tip is in the distal SVC. Persistent cardiac enlargement, tortuous calcified thoracic aorta and slightly prominent mediastinal contours. There are persistent bilateral pleural effusions and bilateral lower lobe airspace consolidation.  IMPRESSION: 1. The endotracheal tube is 1 cm above the carina. 2. The right IJ central venous catheter tip is in the distal SVC. 3. Persistent cardiac enlargement, bilateral pleural effusions and bilateral lower lobe airspace consolidation.   Electronically Signed   By: Marijo Sanes M.D.   On: 07/15/2015 10:26     ASSESSMENT / PLAN:  PULMONARY OETT 9/12>>> A: Intubated for acute respiratory distress in the setting of septic shock, and bibasilar airspace disease (ATX and effusion) Can't exclude aspiration event.  >fio2 requirements improved, but not ready for weaning d/t hemodynamic  Status  P:   Full vent support  PAD protocol  F/u CXR am 9/14  CARDIOVASCULAR CVL right IJ CVL 9/12>>> A:  Septic shock: presume this is abd source due w/ feculent material now draining from JP.  >wbc ct improved >as of 9/13 still pressor dependent, not sure how much of this is sedation and how much is sepsis P:  Keep euvolemic CVP monitoring  Wean levo for MAP > 65  RENAL A:   Hypernatremia -->normalized  Hypophosphatemia  Elevated LFTs-->prob shock liver  P:   IV hydration  Renal dose meds  MAP goal >65 Replace PO4  GASTROINTESTINAL A:   Recurrent pancreatic cancer with Exploratory laparotomy, partial colectomy, splenic flexure takedown, partial gastrectomy, 07/10/2015, Dr.  Barry Dienes CT abd/pelvis neg for active evidence of leak, wonder if spontaneously sealed? Still w/ feculent matter from surgical drain.  P:   NPO  PPI for SUP Cont TNA  Likely UGI series next 24 hrs look for leak, then consider repeat CT scan (per d/w surgery) HEMATOLOGIC A:  Severe leukocytosis -->improving 9/13 Anemia of critical illness -->hgb drift reflects hemodilution 9/13 Thrombocytosis -->stable 9/13 P:  SCDs Add [place] heparin   INFECTIOUS A:   Septic shock presume abd source  P:   BCx2 9/12>>> Sputum 9/12>>> Primaxin 9/12>>> linezolid 9/12>>> anidulafungin 9/12>>>  ENDOCRINE A:   Hyperglycemia  P:   ssi protocol -->insulin gtt   NEUROLOGIC A:   Post-op pain Acute encephalopathy  P:   RASS goal: -2 PAD protocol-->change to PRN fentanyl    FAMILY  - Updates: at bedside   - Inter-disciplinary family meet or Palliative Care meeting due by: 9/19    TODAY'S SUMMARY:  Looks a little better. WBC trending down. Still pressor dependent. Spoke w/ surgery. Plan for UGI series probably in next 24 hrs, then will decide on re-imaging CT scan. Also holding fentanyl gtt. Change to PRN to see if this helps w/ BP.   Erick Colace ACNP-BC [place] Pager # [phone] OR # [phone] if no answer    07/16/2015, 9:11 AM

## 2015-07-16 NOTE — Progress Notes (Signed)
PARENTERAL NUTRITION CONSULT NOTE - Follow Up  Pharmacy Consult for TPN Indication: Strict NPO/NGT for gastric resection   Allergies  Allergen Reactions  . Tape     Allergic  To  Tegaderm.  . Codeine Nausea And Vomiting  . Lactose Intolerance (Gi)   . Tequin     Severe stomach pain  . Vancomycin Itching    Whelps on arm of Infusing IV site  . Penicillins Nausea Only and Rash    Patient Measurements: Height: 5' (152.4 cm) Weight: 187 lb 6.3 oz (85 kg) IBW/kg (Calculated) : 45.5 Adjusted Body Weight: 55 kg Usual Weight:  BMI: 36  Vital Signs: Temp: 99.2 F (37.3 C) (09/13 0855) Temp Source: Axillary (09/13 0855) BP: 124/51 mmHg (09/13 1130) Pulse Rate: 88 (09/13 1130) Intake/Output from previous day: 09/12 0701 - 09/13 0700 In: 4726 [I.V.:3240.7; NG/GT:90; IV Piggyback:1060; TPN:215.3] Out: 1235 [Urine:920; Emesis/NG output:290; Drains:25] Intake/Output from this shift: Total I/O In: 1322.4 [I.V.:392.4; IV Piggyback:530; TPN:400] Out: -   Labs:  Recent Labs  07/14/15 0535 07/15/15 0459 07/16/15 0515  WBC 55.5* 48.8* 33.8*  HGB 10.7* 9.8* 8.9*  HCT 31.3* 28.9* 25.5*  PLT 557* 612* 549*     Recent Labs  07/14/15 0535 07/15/15 0459 07/16/15 0515  NA 143 146* 136  K 5.0 4.8 3.8  CL 110 112* 107  CO2 25 24 23   GLUCOSE 108* 142* 379*  BUN 8 19 21*  CREATININE 0.81 0.94 0.93  CALCIUM 7.5* 7.6* 7.2*  MG  --  2.0 1.9  PHOS  --  3.4 1.4*  PROT  --   --  4.5*  ALBUMIN  --   --  1.5*  AST  --   --  1078*  ALT  --   --  342*  ALKPHOS  --   --  80  BILITOT  --   --  0.8  PREALBUMIN  --   --  <2*  TRIG  --   --  97   Estimated Creatinine Clearance: 53.7 mL/min (by C-G formula based on Cr of 0.93).    Recent Labs  07/16/15 0757 07/16/15 0904 07/16/15 1016  GLUCAP 298* 270* 254*    Medical History: Past Medical History  Diagnosis Date  . Macular degeneration   . Fluid retention   . Colitis, collagenous   . Vitamin D deficiency   .  Osteopenia   . Celiac artery stenosis   . Splenic infarction   . Arthritis   . History of blood clots   . Anxiety   . Shingles   . Pancreatitis   . Osteoporosis   . Clotting disorder   . Arrhythmia     "skips a beat"  [place]auer  heart  . GERD (gastroesophageal reflux disease)   . Breast cancer     rt lumpectomy    Insulin Requirements: insulin drip started early 9/13  Current Nutrition: NPO  IVF: LR at 75 ml/hr  Central access: CVC triple lumen placed 07/15/15, order to place PICC today (patient needs additional site for multiple infusions) TPN start date: 9/12  ASSESSMENT  HPI: 63 yoF with recurrent pancreatic cancer s/p exploratory laparotomy, partial colectomy, splenic flexure takedown, partial gastrectomy on 07/09/2015. On POD4, transferred to ICU for tachycardia, tachpnea, and hypotension. Feculent odor on breath and fecal material from abdominal drain. Concern for pneumonia, bowel leak but CT shows no leak. ? Pancreatitis but lipase negative. Per surgery, residual cancer in abdomen (several nodules on bowel/stomach/peritoneum) so patient will be STRICT NPO/NGT for gastric resection.  Patient has been NPO since admission.  TPN started 9/12.  Significant events:  9/12 transferred to ICU, intubated, CT scan = no evidence of gastric or colonic leak.  No operative interventions at this time per surgery.  Today, 07/16/2015:    Glucose - no hx of DM but CBGs uncontrolled following start of TPN and insulin gtt ordered overnight.  Will add insulin to TPN today.  D5LR MIVF changed to LR.   Will change fluids in norepinephrine drip from D5 to NS.  No other meds mixed in D5.  Electrolytes - K+ 3.8 (trended down after TPN and insulin drip started), Mag 1.9, Phos 1.4 - MD ordered sodium phos 30 mmol this AM (hasn't started yet, waiting on available line - PICC ordered).  Renal - SCr 0.93,  stable  LFTs - elevated, probable shock liver, CCM evaluating RUQ [place]  TGs - 97  Prealbumin - <2  NUTRITIONAL GOALS                                                                                             RD recs: 1382 kcal, 103g protein, 1.8-2L fluid Will be unable to meet RD recommended goals of high protein, hypocaloric feeding with premixed Clinimix products.  PLAN                                                                                                                         At 1800 today:  Reduce Clinimix E 5/15 to 30 ml/hr.  Will reduce current TPN to 30 ml/hr now as well.  Phos dropped and CBGs uncontrolled.    Add regular insulin to TPN at max amount of 65 units/L.  Patient would only receive ~46 units of insulin over 24 hours at 30 ml/hr rate.  Hold lipid infusion for first 7 days of TPN in ICU.  Day #2 ICU admission.  TPN to contain standard multivitamins and trace elements.  Will change fluids in norepinephrine drip from D5 to NS.  MIVF per MD.  F/u I/O.  CBG checks per Glucostabilizer protocol.  CMET, Magnesium, Phosphorus, CBC with diff, TGs ordered daily by MD.  Prealbumin every Monday.  F/u daily.  Electrolyte replacements now: - Proceed with NaPhos 30 mmol as ordered by CCM - KCl 10 mEq/50 mL IV x 2 runs.  Hershal Coria 07/16/2015,12:19 PM

## 2015-07-16 NOTE — Progress Notes (Signed)
  Echocardiogram 2D Echocardiogram has been performed.  Annette Beltran 07/16/2015, 1:53 PM

## 2015-07-16 NOTE — Progress Notes (Signed)
Critical care NP notified of lactic acid of 2.8 and troponin of 2.24. No new orders at this time.

## 2015-07-16 NOTE — Procedures (Signed)
Intubation Procedure Note Annette Beltran [id number] Feb 29, 1946  Procedure: Intubation Indications: Respiratory insufficiency  Procedure Details Consent: Risks of procedure as well as the alternatives and risks of each were explained to the (patient/caregiver).  Consent for procedure obtained. Time Out: Verified patient identification, verified procedure, site/side was marked, verified correct patient position, special equipment/implants available, medications/allergies/relevent history reviewed, required imaging and test results available.  Performed  Maximum sterile technique was used including antiseptics, cap, gloves, gown, hand hygiene and mask.  MAC 3 glides scope  7.5 tube  Mouth small but good visualization w/ glide scope. Would use Glide in future intubations as well.     Evaluation Hemodynamic Status: BP stable throughout; O2 sats: stable throughout Patient's Current Condition: stable Complications: No apparent complications Patient did tolerate procedure well. Chest X-ray ordered to verify placement.  CXR: tube position acceptable.   Annette Beltran,PETE 07/16/2015

## 2015-07-16 NOTE — Progress Notes (Signed)
   07/16/15 1000  Clinical Encounter Type  Visited With Family  Visit Type Follow-up;Psychological support;Spiritual support;Critical Care  Referral From Nurse  Consult/Referral To Chaplain  Spiritual Encounters  Spiritual Needs Prayer;Emotional;Other (Comment) (Pastoral Conversation)  Stress Factors  Family Stress Factors Health changes;Major life changes   Chaplain followed up with the patient's family after previous visit with the family yesterday. The brother, Timmothy Sours, was at the bedside. The family remains hopeful about the patient's medical condition, but understand that severity of her condition.  The family requested follow up care.  Chaplain interventions included pastoral conversation, emotional support and prayer.  Chaplain will continue to follow up with the family to provide spiritual and emotional support.

## 2015-07-16 NOTE — Progress Notes (Signed)
Notified e-link MD on call of increasing CBGs after TNA started. Requested change to Q 4 CBG to better monitor sugars. Instructed to follow ICU Hyperglycemia protocol. Insulin gtt started, no orders to change maintinence fluids.

## 2015-07-16 NOTE — Procedures (Signed)
Central Venous Catheter Insertion Procedure Note ALANNIE AMODIO [id number] 03/28/1946  Procedure: Insertion of Central Venous Catheter Indications: Assessment of intravascular volume, Drug and/or fluid administration and Frequent blood sampling  Procedure Details Consent: Risks of procedure as well as the alternatives and risks of each were explained to the (patient/caregiver).  Consent for procedure obtained. Time Out: Verified patient identification, verified procedure, site/side was marked, verified correct patient position, special equipment/implants available, medications/allergies/relevent history reviewed, required imaging and test results available.  Performed Real time [place] was used to Eval and cannulate the right IJ  Maximum sterile technique was used including antiseptics, cap, gloves, gown, hand hygiene, mask and sheet. Skin prep: Chlorhexidine; local anesthetic administered A antimicrobial bonded/coated triple lumen catheter was placed in the right internal jugular vein using the Seldinger technique.  Evaluation Blood flow good Complications: No apparent complications Patient did tolerate procedure well. Chest X-ray ordered to verify placement.  CXR: pending.  Emberly Tomasso,PETE 07/16/2015, 12:42 PM

## 2015-07-16 NOTE — Progress Notes (Signed)
Patient ID: Annette Beltran, female   DOB: 10-Apr-1946, 69 y.o.   MRN: [id number] 5 Days Post-Op partial colectomy, splenic flexure take down, partial gastrectomy   Subjective: Intubated, got lines, started on norepi.  CT negative for leak. Started on antibiotics.    Objective: Vital signs in last 24 hours: Temp:  [97.6 F (36.4 C)-99.7 F (37.6 C)] 99.7 F (37.6 C) (09/13 0400) Pulse Rate:  [46-90] 86 (09/13 0600) Resp:  [16-25] 16 (09/13 0600) BP: (86-138)/(35-95) 97/48 mmHg (09/13 0630) SpO2:  [92 %-100 %] 92 % (09/13 0821) FiO2 (%):  [40 %-100 %] 40 % (09/13 0821) Last BM Date: 07/16/2015  Intake/Output from previous day: 09/12 0701 - 09/13 0700 In: 4726 [I.V.:3240.7; NG/GT:90; IV Piggyback:1060; TPN:215.3] Out: 1235 [Urine:920; Emesis/NG output:290; Drains:25] Intake/Output this shift:    General appearance:intubated, sedated   Resp: comfortable on vent Cardio: RR&R GI: soft, non distended, no real change in mild tenderness.   JP Murky Incision: no erythema  Lab Results:   Recent Labs  07/15/15 0459 07/16/15 0515  WBC 48.8* 33.8*  HGB 9.8* 8.9*  HCT 28.9* 25.5*  PLT 612* 549*   BMET  Recent Labs  07/15/15 0459 07/16/15 0515  NA 146* 136  K 4.8 3.8  CL 112* 107  CO2 24 23  GLUCOSE 142* 379*  BUN 19 21*  CREATININE 0.94 0.93  CALCIUM 7.6* 7.2*   PT/INR No results for input(s): LABPROT, INR in the last 72 hours. ABG  Recent Labs  07/15/15 0918  PHART 7.451*  HCO3 17.4*    Studies/Results: Ct Abdomen Pelvis W Contrast  07/15/2015   CLINICAL DATA:  Subsequent encounter for 68 year old female with history of pancreatic cancer, status post distal pancreatectomy and splenectomy with partial gastrectomy 1 year ago. Patient more recently presented with colonic obstruction secondary to recurrent pancreatic cancer. Status post exploratory laparotomy with partial colectomy, splenic flexure takedown, and partial gastrectomy on 07/06/2015. Now with  leukocytosis and acute respiratory failure.  EXAM: CT ABDOMEN AND PELVIS WITH CONTRAST  TECHNIQUE: Multidetector CT imaging of the abdomen and pelvis was performed using the standard protocol following bolus administration of intravenous contrast.  CONTRAST:  1108mL OMNIPAQUE IOHEXOL 300 MG/ML  SOLN  COMPARISON:  07/22/2015  FINDINGS: Lower chest: Imaging through the lower chest shows bilateral lower lobe collapse/consolidation with small to moderate bilateral pleural effusions.  Hepatobiliary: Diffuse low-attenuation of the liver parenchyma is again noted, compatible with steatosis. No focal or discrete intrahepatic parenchymal abnormality. Gallbladder is surgically absent. No intrahepatic or extrahepatic biliary dilation.  Pancreas: The patient is again noted be status post distal pancreatectomy. Pancreatic head has normal CT imaging features. There is no substantial edema or inflammation within the anterior para renal space.  Spleen: Surgically absent.  Adrenals/Urinary Tract: No adrenal nodule or mass. No evidence for hydronephrosis. No enhancing lesion in either kidney. No hydroureter. Foley catheter decompresses the urinary bladder and gas within the bladder is compatible with the presence of the Foley device.  Stomach/Bowel: Stomach is nondistended. NG tube tip is in the region of the gastric antrum. Deformity and wall irregularity in the posterior aspect the proximal stomach is associated with surgical suture, compatible with the recent surgery/resection. There is no evidence for gross extravasation of enteric contrast from the posterior stomach. Careful evaluation of the Blake drain (best evaluated on coronal reformations) shows low attenuation in the lumen without convincing evidence for contrast material within the surgical drain. Duodenum is nondilated. There is some oral contrast material in proximal  to mid small bowel which is nondistended. The terminal ileum is normal. The appendix is not visualized,  but there is no edema or inflammation in the region of the cecum.  The colon is collapsed throughout. A colonic suture line is seen in the left paramidline abdomen compatible with side to side anastomosis after splenic flexure takedown. There is no edema, fluid, or inflammation immediately adjacent to the colonic anastomosis. No gas is seen within the pericolonic fat in the region of the anastomosis.  Vascular/Lymphatic: There is abdominal aortic atherosclerosis without aneurysm. There is no gastrohepatic or hepatoduodenal ligament lymphadenopathy. No intraperitoneal or retroperitoneal lymphadenopy. No pelvic sidewall lymphadenopathy.  Reproductive: Uterus is unremarkable.  There is no adnexal mass.  Other: There is a small to moderate amount of intraperitoneal free fluid. In the abdomen, much of the fluid is seen around the liver and an area anterior to the lateral segment of the left liver (image 23 series 2) measures 4.1 x 8.5 cm and contains gas. Gas is also seen in the fluid adjacent to the right liver in just inferior to the tip of the liver. Small volume of fluid is seen each paracolic gutter. There is also free fluid in the anatomic pelvis.  Moderate free air is seen in the abdomen with a right-sided predominance. Some loculation are seen scattered diffusely in the mesentery of the mid and upper abdomen.  Musculoskeletal: Bone windows reveal no worrisome lytic or sclerotic osseous lesions.  IMPRESSION: 1. No evidence for contrast extravasation from the stomach to suggest gastric leak. Careful evaluation of the surgical drain reveals no high attenuation material within the actual lumen of the drain (best evaluated on coronal reformations) to suggest evacuation of a contrast leak through the drainage catheter. 2. No evidence for wall thickening in the region of the colonic anastomosis. There is no fluid or gas accumulation in the region of the colonic anastomosis to suggest leak. 3. Status post distal  pancreatectomy. No evidence for edema or inflammation in the retroperitoneal tissues along the pancreatic clips. 4. Bilateral lower lobe collapse/consolidation with small to moderate bilateral pleural effusions. Bilateral pneumonia could certainly have this appearance. 5. Moderate intraperitoneal fluid, mainly collected around the liver in the abdomen and in the cul-de-sac of the pelvis. No clear etiology for the fluid. 6. Small to moderate intraperitoneal free air. The volume of gas is not unexpected 4 days out from surgery and while somewhat prominent, is within the spectrum of expected findings on postoperative day 4 from exploratory laparotomy. 7. No evidence at this time of an organized or rim enhancing collection of fluid/gas to suggest evolving abscess. 8. I discussed these findings by telephone with Dr.Dezarai Prew at approximately 1330 hours on 07/15/2015.   Electronically Signed   By: Misty Stanley M.D.   On: 07/15/2015 13:36   Dg Chest Port 1 View  07/15/2015   CLINICAL DATA:  Central line placement.  EXAM: PORTABLE CHEST - 1 VIEW  COMPARISON:  07/14/2015  FINDINGS: The endotracheal to is 1 cm above the carina and could be retracted 2 cm. The NG tube is coursing down the esophagus and into the stomach. The right IJ central venous catheter tip is in the distal SVC. Persistent cardiac enlargement, tortuous calcified thoracic aorta and slightly prominent mediastinal contours. There are persistent bilateral pleural effusions and bilateral lower lobe airspace consolidation.  IMPRESSION: 1. The endotracheal tube is 1 cm above the carina. 2. The right IJ central venous catheter tip is in the distal SVC. 3. Persistent  cardiac enlargement, bilateral pleural effusions and bilateral lower lobe airspace consolidation.   Electronically Signed   By: Marijo Sanes M.D.   On: 07/15/2015 10:26   Dg Chest Port 1 View  07/14/2015   CLINICAL DATA:  69 year old female with a history of shortness of breath, fever  EXAM:  PORTABLE CHEST - 1 VIEW  COMPARISON:  PET-CT 05/13/2015, 11/20/2014, chest x-ray 11/08/2014  FINDINGS: Cardiomediastinal silhouette partially obscured by overlying lung/ pleural disease.  Since the prior plain film 11/08/2014, there has been interval development of bibasilar opacity obscuring the left greater than right hemidiaphragm and heart borders. Ill-defined interstitial opacities.  Interval placement of gastric tube.  No displaced fracture.  IMPRESSION: Interval development of left greater than right pleural effusions with associated interstitial and airspace disease concerning for multifocal infection and parapneumonic effusion. Given the oncologic history, follow-up PA and lateral chest X-ray is recommended in 3-4 weeks following trial of antibiotic therapy to ensure resolution and exclude underlying malignancy.  Interval placement of gastric tube.  Signed,  Dulcy Fanny. Earleen Newport, DO  [facility]  [facility]   Electronically Signed   By: Corrie Mckusick D.O.   On: 07/14/2015 12:16    Anti-infectives: Anti-infectives    Start     Dose/Rate Route Frequency Ordered Stop   07/16/15 1000  anidulafungin (ERAXIS) 100 mg in sodium chloride 0.9 % 100 mL IVPB     100 mg over 90 Minutes Intravenous Every 24 hours 07/15/15 0925     07/15/15 1000  imipenem-cilastatin (PRIMAXIN) 500 mg in sodium chloride 0.9 % 100 mL IVPB     500 mg 200 mL/hr over 30 Minutes Intravenous Every 8 hours 07/15/15 0856     07/15/15 1000  linezolid (ZYVOX) IVPB 600 mg     600 mg 300 mL/hr over 60 Minutes Intravenous Every 12 hours 07/15/15 0905     07/15/15 1000  anidulafungin (ERAXIS) 200 mg in sodium chloride 0.9 % 200 mL IVPB     200 mg over 180 Minutes Intravenous  Once 07/15/15 0925 07/15/15 1406   08/01/2015 2000  ceFAZolin (ANCEF) IVPB 2 g/50 mL premix     2 g 100 mL/hr over 30 Minutes Intravenous 3 times per day 07/08/2015 1628 07/14/2015 2349   07/31/2015 0600  ceFAZolin (ANCEF)  IVPB 2 g/50 mL premix     2 g 100 mL/hr over 30 Minutes Intravenous On call to O.R. 07/10/15 0951 07/16/2015 1125      Assessment/Plan: s/p Procedure(s): partial colectomy, splenic flexure take down, partial gastrectomy (N/A) Definitely residual cancer in abdomen.  Several nodules on bowel/stomach/peritoneum.  Not all resectable STRICT NPO/NGT for gastric resection, drain  Broad spectrum antibiotics. Pneumonia, ? Subclinical leak Plan for upper GI via NGT tomorrow.    D/c OnQ.   LOS: 9 days    Aluel Schwarz 07/16/2015

## 2015-07-16 NOTE — Progress Notes (Signed)
Patient ready to transition off of inuslin gtt per ICU Glycemic Control Protocol. Pharmacist recommended to hold Lantus in the protocol. Dr. Joya Gaskins aware and agrees with holding Lantus. Lantus held and insulin gtt turned off. CBG 91 when gtt turned off. Will continue to monitor.

## 2015-07-17 ENCOUNTER — Inpatient Hospital Stay (HOSPITAL_COMMUNITY): Payer: Medicare Other

## 2015-07-17 ENCOUNTER — Other Ambulatory Visit: Payer: Self-pay | Admitting: Oncology

## 2015-07-17 DIAGNOSIS — I4891 Unspecified atrial fibrillation: Secondary | ICD-10-CM | POA: Diagnosis not present

## 2015-07-17 DIAGNOSIS — I481 Persistent atrial fibrillation: Secondary | ICD-10-CM

## 2015-07-17 DIAGNOSIS — R6521 Severe sepsis with septic shock: Secondary | ICD-10-CM

## 2015-07-17 DIAGNOSIS — A419 Sepsis, unspecified organism: Secondary | ICD-10-CM

## 2015-07-17 DIAGNOSIS — I214 Non-ST elevation (NSTEMI) myocardial infarction: Secondary | ICD-10-CM | POA: Diagnosis not present

## 2015-07-17 DIAGNOSIS — I519 Heart disease, unspecified: Secondary | ICD-10-CM

## 2015-07-17 LAB — CBC WITH DIFFERENTIAL/PLATELET
BAND NEUTROPHILS: 0 %
BLASTS: 0 %
Basophils Absolute: 0 10*3/uL (ref 0.0–0.1)
Basophils Relative: 0 %
Eosinophils Absolute: 0.4 10*3/uL (ref 0.0–0.7)
Eosinophils Relative: 1 %
HEMATOCRIT: 23.9 % — AB (ref 36.0–46.0)
HEMOGLOBIN: 8.5 g/dL — AB (ref 12.0–15.0)
LYMPHS PCT: 2 %
Lymphs Abs: 0.7 10*3/uL (ref 0.7–4.0)
MCH: 32.1 pg (ref 26.0–34.0)
MCHC: 35.6 g/dL (ref 30.0–36.0)
MCV: 90.2 fL (ref 78.0–100.0)
MONOS PCT: 0 %
Metamyelocytes Relative: 0 %
Monocytes Absolute: 0 10*3/uL — ABNORMAL LOW (ref 0.1–1.0)
Myelocytes: 0 %
NEUTROS ABS: 34.5 10*3/uL — AB (ref 1.7–7.7)
NEUTROS PCT: 97 %
NRBC: 7 /100{WBCs} — AB
OTHER: 0 %
PROMYELOCYTES ABS: 0 %
Platelets: 451 10*3/uL — ABNORMAL HIGH (ref 150–400)
RBC: 2.65 MIL/uL — ABNORMAL LOW (ref 3.87–5.11)
RDW: 15 % (ref 11.5–15.5)
WBC: 35.6 10*3/uL — ABNORMAL HIGH (ref 4.0–10.5)

## 2015-07-17 LAB — COMPREHENSIVE METABOLIC PANEL
ALBUMIN: 1.4 g/dL — AB (ref 3.5–5.0)
ALK PHOS: 86 U/L (ref 38–126)
ALT: 239 U/L — ABNORMAL HIGH (ref 14–54)
ANION GAP: 6 (ref 5–15)
AST: 372 U/L — ABNORMAL HIGH (ref 15–41)
BUN: 18 mg/dL (ref 6–20)
CALCIUM: 7.1 mg/dL — AB (ref 8.9–10.3)
CO2: 24 mmol/L (ref 22–32)
Chloride: 106 mmol/L (ref 101–111)
Creatinine, Ser: 0.77 mg/dL (ref 0.44–1.00)
GFR calc non Af Amer: >60 mL/min (ref >60)
GLUCOSE: 115 mg/dL — AB (ref 65–99)
POTASSIUM: 3.6 mmol/L (ref 3.5–5.1)
SODIUM: 136 mmol/L (ref 135–145)
TOTAL PROTEIN: 4.2 g/dL — AB (ref 6.5–8.1)
Total Bilirubin: 0.8 mg/dL (ref 0.3–1.2)

## 2015-07-17 LAB — GLUCOSE, CAPILLARY
GLUCOSE-CAPILLARY: 130 mg/dL — AB (ref 65–99)
GLUCOSE-CAPILLARY: 134 mg/dL — AB (ref 65–99)
GLUCOSE-CAPILLARY: 140 mg/dL — AB (ref 65–99)
GLUCOSE-CAPILLARY: 91 mg/dL (ref 65–99)
Glucose-Capillary: 149 mg/dL — ABNORMAL HIGH (ref 65–99)
Glucose-Capillary: 124 mg/dL — ABNORMAL HIGH (ref 65–99)
Glucose-Capillary: 96 mg/dL (ref 65–99)

## 2015-07-17 LAB — PHOSPHORUS: Phosphorus: 2.5 mg/dL (ref 2.5–4.6)

## 2015-07-17 LAB — MAGNESIUM: MAGNESIUM: 1.7 mg/dL (ref 1.7–2.4)

## 2015-07-17 LAB — TRIGLYCERIDES: Triglycerides: 88 mg/dL (ref <150)

## 2015-07-17 LAB — LACTIC ACID, PLASMA: LACTIC ACID, VENOUS: 2.6 mmol/L — AB (ref 0.5–2.0)

## 2015-07-17 LAB — TROPONIN I: TROPONIN I: 1.25 ng/mL — AB (ref <0.031)

## 2015-07-17 MED ORDER — DIATRIZOATE MEGLUMINE & SODIUM 66-10 % PO SOLN
15.0000 mL | Freq: Once | ORAL | Status: AC
  Administered 2015-07-17: 15 mL

## 2015-07-17 MED ORDER — METOPROLOL TARTRATE 1 MG/ML IV SOLN
5.0000 mg | Freq: Once | INTRAVENOUS | Status: AC
  Administered 2015-07-17: 5 mg via INTRAVENOUS
  Filled 2015-07-17: qty 5

## 2015-07-17 MED ORDER — CHLORHEXIDINE GLUCONATE 0.12 % MT SOLN
OROMUCOSAL | Status: AC
  Administered 2015-07-17: 15 mL via OROMUCOSAL
  Filled 2015-07-17: qty 15

## 2015-07-17 MED ORDER — MORPHINE SULFATE 25 MG/ML IV SOLN
8.0000 mg/h | INTRAVENOUS | Status: DC
  Administered 2015-07-17: 8 mg/h via INTRAVENOUS
  Filled 2015-07-17 (×2): qty 10

## 2015-07-17 MED ORDER — MORPHINE BOLUS VIA INFUSION
5.0000 mg | INTRAVENOUS | Status: DC | PRN
  Administered 2015-07-17 – 2015-07-18 (×2): 5 mg via INTRAVENOUS
  Administered 2015-07-18: 10 mg via INTRAVENOUS
  Administered 2015-07-18 (×3): 5 mg via INTRAVENOUS
  Filled 2015-07-17 (×7): qty 20

## 2015-07-17 MED ORDER — POTASSIUM CHLORIDE 10 MEQ/50ML IV SOLN
10.0000 meq | INTRAVENOUS | Status: DC
Start: 2015-07-17 — End: 2015-07-17

## 2015-07-17 MED ORDER — DEXTROSE 5 % IV SOLN
INTRAVENOUS | Status: DC
  Administered 2015-07-17: 17:00:00 via INTRAVENOUS

## 2015-07-17 MED ORDER — IOHEXOL 300 MG/ML  SOLN
50.0000 mL | Freq: Once | INTRAMUSCULAR | Status: DC | PRN
  Administered 2015-07-17: 50 mL via ORAL
  Filled 2015-07-17: qty 50

## 2015-07-17 MED ORDER — SODIUM CHLORIDE 0.9 % IV SOLN
10.0000 mmol | Freq: Once | INTRAVENOUS | Status: DC
  Administered 2015-07-17: 10 mmol via INTRAVENOUS
  Filled 2015-07-17: qty 3.33

## 2015-07-17 MED ORDER — SODIUM CHLORIDE 0.9 % IV SOLN: 500.0000 mg | Freq: Four times a day (QID) | INTRAVENOUS | Status: DC

## 2015-07-17 MED ORDER — DEXTROSE 5 % IV SOLN
30.0000 ug/min | INTRAVENOUS | Status: DC
  Administered 2015-07-17: 30 ug/min via INTRAVENOUS
  Administered 2015-07-17: 50 ug/min via INTRAVENOUS
  Administered 2015-07-17 – 2015-07-18 (×4): 40 ug/min via INTRAVENOUS
  Filled 2015-07-17 (×7): qty 1

## 2015-07-17 MED ORDER — HEPARIN (PORCINE) IN NACL 100-0.45 UNIT/ML-% IJ SOLN
1000.0000 [IU]/h | INTRAMUSCULAR | Status: DC
  Administered 2015-07-17: 1000 [IU]/h via INTRAVENOUS
  Filled 2015-07-17: qty 250

## 2015-07-17 MED ORDER — POTASSIUM CHLORIDE 10 MEQ/50ML IV SOLN
10.0000 meq | INTRAVENOUS | Status: DC
  Administered 2015-07-17 (×2): 10 meq via INTRAVENOUS
  Filled 2015-07-17 (×2): qty 50

## 2015-07-17 MED ORDER — MAGNESIUM SULFATE 2 GM/50ML IV SOLN
2.0000 g | Freq: Once | INTRAVENOUS | Status: DC
  Administered 2015-07-17: 2 g via INTRAVENOUS
  Filled 2015-07-17: qty 50

## 2015-07-17 MED ORDER — TRACE MINERALS CR-CU-MN-SE-ZN 10-1000-500-60 MCG/ML IV SOLN
INTRAVENOUS | Status: DC
  Filled 2015-07-17: qty 960

## 2015-07-17 MED ORDER — SODIUM CHLORIDE 0.9 % IV SOLN
500.0000 mg | Freq: Four times a day (QID) | INTRAVENOUS | Status: DC
  Administered 2015-07-17: 500 mg via INTRAVENOUS
  Filled 2015-07-17 (×2): qty 500

## 2015-07-17 NOTE — Progress Notes (Signed)
PULMONARY / CRITICAL CARE MEDICINE   Name: Annette Beltran MRN: [id number] DOB: [birth year]    ADMISSION DATE:  07/10/2015 CONSULTATION DATE:  9/12  REFERRING MD :  Barry Dienes   CHIEF COMPLAINT:  Acute respiratory failure   INITIAL PRESENTATION:  69 year old female w/ Recurrent pancreatic cancer now s/p  Exploratory laparotomy, partial colectomy, splenic flexure takedown, partial gastrectomy, 07/31/2015, Dr. Barry Dienes. Post-op course largely unremarkable w/ exception of leukocytosis until the AM of 9/12 when developed acute respiratory failure, delirium and subsequent shock. Feculent material draining from post-op drain. PCCM asked to assist w/ care.   STUDIES:  CT abd/pelvis 9/12: 1. No evidence for contrast extravasation from the stomach to suggest gastric leak. Careful evaluation of the surgical drain reveals no high attenuation material within the actual lumen of the drain (best evaluated on coronal reformations) to suggest evacuation of a contrast leak through the drainage catheter.2. No evidence for wall thickening in the region of the colonic anastomosis. There is no fluid or gas accumulation in the region of the colonic anastomosis to suggest leak. 3. Status post distal pancreatectomy. No evidence for edema or inflammation in the retroperitoneal tissues along the pancreatic clips. 4. Bilateral lower lobe collapse/consolidation with small to moderate bilateral pleural effusions. Bilateral pneumonia could certainly have this appearance. 5. Moderate intraperitoneal fluid, mainly collected around the liver in the abdomen and in the cul-de-sac of the pelvis. No clear etiology for the fluid. 9/13: [place] abd:  Liver abnormal in appearance consistent with at least hepatic steatosis. Cirrhosis should be considered as suggested by the surface nodularity and coarsened echotexture. No discrete liver mass 8/67 ECHO: Systolic function was mildly to moderately reduced. Theestimated ejection fraction was in the range  of 40% to 45%.Calcified apical false tendon versus possible organized thrombus.There is anterior, anteroseptal and apical hypokinesis suggestiveof LAD territory ischemia/infarct. Doppler parameters areconsistent with abnormal left ventricular relaxation (grade 1diastolic dysfunction) SIGNIFICANT EVENTS: 9/8: Exploratory laparotomy, partial colectomy, splenic flexure takedown, partial gastrectomy 9/12:  developed acute respiratory failure, delirium and subsequent shock. Feculent material draining from post-op drain. PCCM asked to assist w/ care. Intubated. CVL placed. ABX widened Fluid challenged and CT abd ordered.  9/13: CT results w/out leak. Pressors weaning. FIO2 weaning. TNA started. WBCs trending down. Stopping fent gtt to see how much is sedation vs residual sepsis driving Levo requirements.  9/13: AF w/ RVR. CEs peaked at 2.24. ECHO suggesting depressed EF and ant/lat plus apical akinesis.  9/14: still on and off pressors. No sig change   SUBJECTIVE:  No sig change VITAL SIGNS: Temp:  [98.2 F (36.8 C)-101.4 F (38.6 C)] 99.7 F (37.6 C) (09/14 0400) Pulse Rate:  [42-138] 110 (09/14 0811) Resp:  [16-26] 18 (09/14 0811) BP: (68-129)/(26-86) 87/58 mmHg (09/14 0811) SpO2:  [91 %-98 %] 98 % (09/14 0811) FiO2 (%):  [40 %] 40 % (09/14 0811) Weight:  [91.6 kg (201 lb 15.1 oz)] 91.6 kg (201 lb 15.1 oz) (09/14 0300) HEMODYNAMICS: CVP:  [13 mmHg-19 mmHg] 19 mmHg VENTILATOR SETTINGS: Vent Mode:  [-] PRVC FiO2 (%):  [40 %] 40 % Set Rate:  [16 bmp] 16 bmp Vt Set:  [500 mL] 500 mL PEEP:  [5 cmH20] 5 cmH20 Plateau Pressure:  [23 cmH20-36 cmH20] 23 cmH20 INTAKE / OUTPUT:  Intake/Output Summary (Last 24 hours) at 07/17/15 0909 Last data filed at 07/17/15 0648  Gross per 24 hour  Intake 4938.17 ml  Output   1145 ml  Net 3793.17 ml    PHYSICAL EXAMINATION:  General:  Sedated on vent  Neuro:  Sedated, non-focal  HEENT:  NCAT, now orally intubated w/ 7.5 ETT Cardiovascular:  Tachy  rrr No MRG Lungs:  Prolonged exp wheeze, scattered rhonchi Abdomen:  Incision intact, not tender to palp, hypoactive BS; feculent material draining from JP drain persists -->still Musculoskeletal:  Intact  Skin:  Diaphoretic but intact, worsening anasarca  LABS:  CBC  Recent Labs Lab 07/15/15 0459 07/16/15 0515 07/17/15 0515  WBC 48.8* 33.8* 35.6*  HGB 9.8* 8.9* 8.5*  HCT 28.9* 25.5* 23.9*  PLT 612* 549* 451*   Coag's  Recent Labs Lab 07/10/15 1145  INR 1.03   BMET  Recent Labs Lab 07/15/15 0459 07/16/15 0515 07/17/15 0515  NA 146* 136 136  K 4.8 3.8 3.6  CL 112* 107 106  CO2 24 23 24   BUN 19 21* 18  CREATININE 0.94 0.93 0.77  GLUCOSE 142* 379* 115*   Electrolytes  Recent Labs Lab 07/15/15 0459 07/16/15 0515 07/17/15 0515  CALCIUM 7.6* 7.2* 7.1*  MG 2.0 1.9 1.7  PHOS 3.4 1.4* 2.5   Sepsis Markers  Recent Labs Lab 07/16/15 1250 07/16/15 1815 07/16/15 2320  LATICACIDVEN 2.8* 1.9 2.6*   ABG  Recent Labs Lab 07/15/15 0918  PHART 7.451*  PCO2ART 25.4*  PO2ART 230*   Liver Enzymes  Recent Labs Lab 07/16/15 0515 07/17/15 0515  AST 1078* 372*  ALT 342* 239*  ALKPHOS 80 86  BILITOT 0.8 0.8  ALBUMIN 1.5* 1.4*   Cardiac Enzymes  Recent Labs Lab 07/16/15 1250 07/16/15 1815 07/16/15 2320  TROPONINI 2.24* 1.83* 1.25*   Glucose  Recent Labs Lab 07/16/15 1915 07/16/15 2026 07/16/15 2218 07/17/15 0034 07/17/15 0340 07/17/15 0810  GLUCAP 96 91 149* 134* 124* 96    Imaging US Abdomen Limited Ruq  07/16/2015   CLINICAL DATA:  Transaminitis. History of a Whipple procedure including a cholecystectomy.  EXAM: US ABDOMEN LIMITED - RIGHT UPPER QUADRANT  COMPARISON:  CT, 07/15/2015  FINDINGS: Gallbladder:  Surgically absent  Common bile duct:  Diameter: 1.8 mm  Liver:  Coarse echotexture with diffusely increased echogenicity. Surface nodularity. Decreased through transmission of the sound beam somewhat limiting examination of the  posterior liver. No discrete liver mass or focal lesion. Hepatopetal flow documented in the portal vein. There is surrounding ascites.  IMPRESSION: 1. Liver abnormal in appearance consistent with at least hepatic steatosis. Cirrhosis should be considered as suggested by the surface nodularity and coarsened echotexture. No discrete liver mass or focal lesion is seen. 2. No bile duct dilation. 3. Ascites.  Status post cholecystectomy.   Electronically Signed   By: Lajean Manes M.D.   On: 07/16/2015 16:58     ASSESSMENT / PLAN:  PULMONARY OETT 9/12>>> A: Intubated for acute respiratory distress in the setting of septic shock, and bibasilar airspace disease (ATX and effusion) Can't exclude aspiration event.  >fio2 requirements stable. Failed SBT. Would hold off on weaning for another 24-48 hrs given it is not clear that all of her acute issues from an infection stand-point are resolving.  P:   Full vent support  PAD protocol  F/u CXR am 9/15  CARDIOVASCULAR CVL right IJ CVL 9/12>>> A:  Septic shock: presume this is abd source due w/ feculent material now draining from JP.  >wbc ct improved Ant/lateral MI w/ new systemic CM.  PAF w/ RVR-->converted back to ST  >on low dose pressors as of 9/14. Spoke w/ family  P:  Keep euvolemic CVP monitoring  Change  to Neo as needed for pressors Cards consult pending-->Don't think there is really anything to add here except support   RENAL A:   Hypernatremia -->normalized  Intermittent Hypophosphatemia  Elevated LFTs-->prob shock liver  P:   euvolemic goal Renal dose meds  MAP goal >65  GASTROINTESTINAL A:   Recurrent pancreatic cancer with Exploratory laparotomy, partial colectomy, splenic flexure takedown, partial gastrectomy, 07/15/2015, Dr. Barry Dienes CT abd/pelvis neg for active evidence of leak, wonder if spontaneously sealed? Still w/ feculent matter from surgical drain.  P:   NPO  PPI for SUP Cont TNA  Likely UGI series today look for  leak, then consider repeat CT scan (per d/w surgery)  HEMATOLOGIC A:  Severe leukocytosis -->still elevated, no sig changed c/w 9/13 Anemia of critical illness -->hgb stable as of 9/14 Thrombocytosis -->stable 9/14 P:  SCDs Added  heparin   INFECTIOUS A:   Septic shock presume abd source  P:   BCx2 9/12>>> Sputum 9/12>>> Primaxin 9/12>>> linezolid 9/12>>> anidulafungin 9/12>>>  ENDOCRINE A:   Hyperglycemia  P:   ssi protocol -->insulin gtt   NEUROLOGIC A:   Post-op pain Acute encephalopathy  P:   RASS goal: -2 PAD protocol-->change back to IV fent. Needs rest given MI and acute illness   FAMILY  - Updates: at bedside   - Inter-disciplinary family meet or Palliative Care meeting due by: 9/19    TODAY'S SUMMARY:  Looks about the same. Not ready to wean. Cards consult pending but doubt anything to do. Had AF last night, now back in Sinus. Will cont current RX. Anticipate GI series today. Family wants to discuss goals of care. Apparently asking nurse about palliative involvement. Not sure that we need them for goals of care. We can address that. Would be appropriate for limitations, for example DNR status would be appropriate. Don't feel like we have anything that says we should pull back yet. Will speak to surgery as well. Happy to have this conversation w/ family.    Erick Colace ACNP-BC [place] Pager # [phone] OR # [phone] if no answer    07/17/2015, 9:09 AM

## 2015-07-17 NOTE — Progress Notes (Signed)
Chaplain referred by the daytime chaplain. Annette Beltran is actively dying. Her family has accepted this fact and are committed to her last hours to be in comfort care. Daughters have come and said their last good byes. Husband [name] is steadfast in wishing to remain by her side until death parts them. Spiritual comfort through grief counsel and prayer has been given. Daughters do not wish to return to see Annette Giuliano in her present condition.   Page the chaplain when Annette Donahoo dies, in order for the chaplain to complete comfort care for husband [name].  Sallee Lange. Lejuan Botto, DMin, MDiv Chaplain

## 2015-07-17 NOTE — Progress Notes (Signed)
eLink Physician-Brief Progress Note Patient Name: Annette Beltran DOB: [birth year] MRN: [id number]   Date of Service  07/17/2015  HPI/Events of Note  HR had been in the 90s but now in the 130s AF with RVR.  Remains on 2 mcg of NE for BP support  eICU Interventions  Plan: Will try to take patient off NE gtt and start NEO gtt for BP support.  Will see if this helps HR.  Consider one time dose of BB is elevated HR persists. Continue to monitor patient.     Intervention Category Intermediate Interventions: Arrhythmia - evaluation and management  DETERDING,ELIZABETH 07/17/2015, 2:39 AM

## 2015-07-17 NOTE — Progress Notes (Signed)
COURTESY NOTE: Reviewed postoperative events. Met with Annette Beltran's husband Annette Beltran and her two daughters, as well as Annette Beltran's sister. They understand with a great deal of effort including more invasive procedures it might be possible to pull Annette Beltran out of the current situation. It would take her 2-3 months to recover, including a prolonged stay in the ICU. All that time the cancer would be growing so while Annette Beltran might be improving in one way, she would be declining in another way. They know her cancer is not curable and that the chemotherapy available is frequently unhelpful--also that Annette Beltran was pretty clear she did not want more chemotherapy.  Given this bleak picture, the family is very clear they do not want further invasive procedures. They would like to move to a comfort care approach. They are discussing this with critical care. I think this is a very painful but rational decision made by a united family entirely in the interest of the patient. I fully support it.  Will follow with you

## 2015-07-17 NOTE — Progress Notes (Signed)
PARENTERAL NUTRITION CONSULT NOTE - Follow Up  Pharmacy Consult for TPN Indication: possible bowel leak, strict NPO/NGT for gastric resection   Allergies  Allergen Reactions  . Tape     Allergic  To  Tegaderm.  . Codeine Nausea And Vomiting  . Lactose Intolerance (Gi)   . Tequin     Severe stomach pain  . Vancomycin Itching    Whelps on arm of Infusing IV site  . Penicillins Nausea Only and Rash    Patient Measurements: Height: 5' (152.4 cm) Weight: 201 lb 15.1 oz (91.6 kg) IBW/kg (Calculated) : 45.5 Adjusted Body Weight: 55 kg Usual Weight:  BMI: 36  Vital Signs: Temp: 99.6 F (37.6 C) (09/14 0800) Temp Source: Axillary (09/14 0800) BP: 87/58 mmHg (09/14 0811) Pulse Rate: 110 (09/14 0811) Intake/Output from previous day: 09/13 0701 - 09/14 0700 In: 4938.2 [I.V.:2079.7; NG/GT:290; IV Piggyback:1490; TPN:1058.5] Out: 1180 [Urine:965; Emesis/NG output:75; Drains:140] Intake/Output from this shift:    Labs:  Recent Labs  07/15/15 0459 07/16/15 0515 07/17/15 0515  WBC 48.8* 33.8* 35.6*  HGB 9.8* 8.9* 8.5*  HCT 28.9* 25.5* 23.9*  PLT 612* 549* 451*     Recent Labs  07/15/15 0459 07/16/15 0515 07/17/15 0515  NA 146* 136 136  K 4.8 3.8 3.6  CL 112* 107 106  CO2 24 23 24   GLUCOSE 142* 379* 115*  BUN 19 21* 18  CREATININE 0.94 0.93 0.77  CALCIUM 7.6* 7.2* 7.1*  MG 2.0 1.9 1.7  PHOS 3.4 1.4* 2.5  PROT  --  4.5* 4.2*  ALBUMIN  --  1.5* 1.4*  AST  --  1078* 372*  ALT  --  342* 239*  ALKPHOS  --  80 86  BILITOT  --  0.8 0.8  PREALBUMIN  --  <2*  --   TRIG  --  97 88   Estimated Creatinine Clearance: 65.1 mL/min (by C-G formula based on Cr of 0.77).    Recent Labs  07/17/15 0034 07/17/15 0340 07/17/15 0810  GLUCAP 134* 124* 96    Medical History: Past Medical History  Diagnosis Date  . Macular degeneration   . Fluid retention   . Colitis, collagenous   . Vitamin D deficiency   . Osteopenia   . Celiac artery stenosis   . Splenic  infarction   . Arthritis   . History of blood clots   . Anxiety   . Shingles   . Pancreatitis   . Osteoporosis   . Clotting disorder   . Arrhythmia     "skips a beat"  [place]  heart  . GERD (gastroesophageal reflux disease)   . Breast cancer     rt lumpectomy    Insulin Requirements: insulin drip started early 9/13  Current Nutrition: NPO  IVF: LR at 75 ml/hr  Central access: CVC triple lumen placed 07/15/15, order to place PICC today (patient needs additional site for multiple infusions) TPN start date: 9/12  ASSESSMENT  HPI: 66 yoF with recurrent pancreatic cancer s/p exploratory laparotomy, partial colectomy, splenic flexure takedown, partial gastrectomy on 07/07/2015. On POD4, transferred to ICU for tachycardia, tachpnea, and hypotension. Feculent odor on breath and fecal material from abdominal drain. Concern for pneumonia, bowel leak but CT shows no leak. ? Pancreatitis but lipase negative. Per surgery, residual cancer in abdomen (several nodules on bowel/stomach/peritoneum) so patient will be STRICT NPO/NGT for gastric resection.  Patient has been NPO since admission.  TPN started 9/12.  Significant events:  9/12 transferred to ICU, intubated, CT scan = no evidence of gastric or colonic leak.  No operative interventions at this time per surgery. 9/13   Today, 07/17/2015:    Glucose - no hx of DM but CBGs uncontrolled following start of TPN and insulin gtt started 9/13.  Removed D5 from fluids, added insulin to TPN and patient was able to transition off insulin drip last night.  CBGs now controlled with insulin in TPN and SSI for coverage. CBGs are tightly controlled with a couple of values in the 90s so will decrease insulin in TPN slightly to avoid hypoglycemia.  Electrolytes - K+ 3.6 (would like at least 4), Mag 1.7 (goal >2), Phos 2.5 - improved after replacement  yesterday but at lower end of range (want closer to 4). CorrCa wnl.  Renal - SCr 0.77, stable  LFTs - elevated but improving, probable shock liver  TGs - 97  Prealbumin - <2  Net I/O - +3.7L  NUTRITIONAL GOALS                                                                                             RD recs: 1382 kcal, 103g protein, 1.8-2L fluid Will be unable to meet RD recommended goals of high protein, hypocaloric feeding with premixed Clinimix products.  Clinimix 5/15 at 83 ml/hr (without lipids for first 7 days of ICU stay) will provide 100g protein and 1420 kcal.  PLAN                                                                                                                         At 1800 today:  Advance Clinimix E 5/15 to 40 ml/hr.  Will titrate slow due to concerns with refeeding and glucose control.  Add regular insulin to TPN at 50 units/L (patient will receive 48 units over 24 hours).  Hold lipid infusion for first 7 days of TPN in ICU.  Day #3 ICU admission.  TPN to contain standard multivitamins and trace elements.  MIVF per MD.  F/u I/O.  CBG checks q4h with SSI coverage  CMET,  Magnesium, Phosphorus, CBC with diff, TGs ordered daily by MD.  Prealbumin every Monday.  F/u daily.  Electrolyte replacements now: - KPhos 10 mmol in 250 ml NS IV x 1  - KCl 10 mEq/50 mL IV x 2 runs - Mag sulfate 1g IV x 1  Antonya Leeder 07/17/2015,12:01 PM

## 2015-07-17 NOTE — Progress Notes (Signed)
Long d/w family. Including husband, daughters and extended family. They are very concerned about the pt's course. They understand that there is not currently a leak and that the next question is that if a CT scan were to determine abscess would we go forward w/ CT guided drainage. The family is very concerned about "putting her through another procedure" knowing she still has stage IV pancreatic cancer and is not going to seek treatment. At this point we have decided to make Annette Beltran a DNR. The family does not want to escalate care should things take a turn for the worse. I think that the family is leaning towards transition to comfort. I have discussed this w/ Dr Jana Hakim who is in support of this. Will also speak w/ Dr Barry Dienes. For now we will cont current rx, no escalation.   Erick Colace ACNP-BC [place] Pager # [phone] OR # [phone] if no answer

## 2015-07-17 NOTE — Progress Notes (Signed)
eLink Physician-Brief Progress Note Patient Name: Annette Beltran DOB: 05/24/1946 MRN: [id number]   Date of Service  07/17/2015  HPI/Events of Note  HR remains elevated at 147 despite NE gtt off and NEO gtt almost off.  Current BP of 148/119 (127)  eICU Interventions  Plan: One time dose of 5 mg lopressor IV     Intervention Category Intermediate Interventions: Arrhythmia - evaluation and management  DETERDING,ELIZABETH 07/17/2015, 3:54 AM

## 2015-07-17 NOTE — Progress Notes (Signed)
Update: After speaking once again w/ family they now request full comfort measures. Will respect these wishes.    Erick Colace ACNP-BC [place] Pager # [phone] OR # [phone] if no answer

## 2015-07-17 NOTE — Progress Notes (Signed)
Nutrition Follow-up  DOCUMENTATION CODES:   Obesity unspecified  INTERVENTION:  - Continue TPN per pharmacy - RD will continue to monitor for needs  NUTRITION DIAGNOSIS:   Inadequate oral intake related to inability to eat, altered GI function as evidenced by NPO status. -ongoing  GOAL:   Patient will meet greater than or equal to 90% of their needs -unmet even with TPN  MONITOR:   Vent status, Weight trends, Labs, I & O's  ASSESSMENT:   She presents to the emergency department due to ongoing crampy abdominal pain, distention and nausea for the past week PTA. She underwent ex lap with partial colectomy, splenic flexure take down, partial gastrectomy  for small and large bowel obstruction  9/8.  9/14 TPN initiated at 1800 9/12 and order currently in place for Clinimix E 5/15 @ 40 mL/hr without lipids, per ICU protocol. This regimen provides 48 grams protein, 682 kcal which does not meet needs. Needs calculated during initial assessment remain appropriate after re-calculation given current vent status/medical course.  Patient remains intubated on ventilator support. Spontaneous breathing trials have been conducted with plan for pt to remain on full vent support at this time. MV: 11.8 L/min Temp (24hrs), Avg:99.5 F (37.5 C), Min:98.2 F (36.8 C), Max:100.1 F (37.8 C)  Propofol: none  Unable to enter pt's room x2 attempted visits due to sterile procedure being done in room.  Per pharmacy note today: At 1800 today:  Advance Clinimix E 5/15 to 40 ml/hr. Will titrate slow due to concerns with refeeding and glucose control.  Add regular insulin to TPN at 50 units/L (patient will receive 48 units over 24 hours).  Hold lipid infusion for first 7 days of TPN in ICU. Day #3 ICU admission.  TPN to contain standard multivitamins and trace elements.  MIVF per MD. F/u I/O.  CBG checks q4h with SSI coverage  CMET, Magnesium, Phosphorus, CBC with diff, TGs ordered daily by  MD. Prealbumin every Monday.  Per NP note this AM, CT of abdomen/pelvis negative for evidence of leak.  Not meeting needs with current TPN regimen. Clinimix E 5/15 @ 83 mL/hr without lipids will provide 100 grams protein, 1414 kcal and is the closest premixed TPN formula can get to estimated needs.  Medications reviewed. Labs reviewed; CBGs: 91-149 mg/dL, Ca: 7.1 mg/dL.    9/12 - Pt has been NPO since admission (days). She is POD #4. - Pt was transferred to the ICU from [place] this AM and was subsequently intubated due to respiratory failure.  - MV: 8.9 L/min - Family at bedside state that pt was complaining of abdominal discomfort after eating for the past two weeks. Pt had described this as a full feeling and that she felt maybe she was eating too much at at time. She then developed diarrhea which was not necessary associated with PO intakes.  - Family feels that abdominal discomfort was going on for "a long time" prior to pt mentioning it to them. They state no recent weight changes and that pt had mentioned that she had not lost any weight despite eating less.  - Will monitor for GOC, need for TPN.  - NP note from this AM indicates suspicion for anastomotic leak.  Diet Order:  Diet NPO time specified .TPN (CLINIMIX-E) Adult .TPN (CLINIMIX-E) Adult  Skin:  Wound (see comment) (abdominal surgical wound)  Last BM:  9/4  Height:   Ht Readings from Last 1 Encounters:  07/17/15 5' (1.524 m)    Weight:  Wt Readings from Last 1 Encounters:  07/17/15 201 lb 15.1 oz (91.6 kg)    Ideal Body Weight:  45.45 kg (kg)  BMI:  Body mass index is 39.44 kg/(m^2).  Estimated Nutritional Needs:   Kcal:  1382  Protein:  103 grams protein  Fluid:  1.8-2 L/day  EDUCATION NEEDS:   No education needs identified at this time      Jarome Matin, RD, LDN Inpatient Clinical Dietitian Pager # [phone] After hours/weekend pager # [phone]

## 2015-07-17 NOTE — Consult Note (Signed)
Patient ID: Annette Beltran MRN: [id number], DOB/AGE: [birth year]   Admit date: 07/14/2015   Primary Physician: [name], MD Primary Cardiologist: [facility] (Dr. Radford Pax).  Pt. Profile:  69 y/o female with recurrent pancreatic cancer now s/p exploratory laparotomy, partial colectomy, splenic flexure takedown, partial gastrectomy, 07/31/2015, by Dr. Barry Dienes. Post recovery complicated by development of septic shock and acute respiratory failure requiring intubation. Found to have elevated troponin's with a peak of 2.24 and echo findings suggestive of LAD territory ischemia/infarct with reduced EF at 40-45%. Also with paroxysmal afib w/ RVR.   Problem List  Past Medical History  Diagnosis Date  . Macular degeneration   . Fluid retention   . Colitis, collagenous   . Vitamin D deficiency   . Osteopenia   . Celiac artery stenosis   . Splenic infarction   . Arthritis   . History of blood clots   . Anxiety   . Shingles   . Pancreatitis   . Osteoporosis   . Clotting disorder   . Arrhythmia     "skips a beat"  [place]  heart  . GERD (gastroesophageal reflux disease)   . Breast cancer     rt lumpectomy    Past Surgical History  Procedure Laterality Date  . Foot surgery  2003    x 3, 2 on right, 1 on left  . Breast lumpectomy Right     radiation for 6 weeks  . Meniscus repair Right   . Eus N/A 11/23/2013    Procedure: UPPER ENDOSCOPIC ULTRASOUND (EUS) LINEAR;  Surgeon: Milus Banister, MD;  Location: [facility];  Service: Endoscopy;  Laterality: N/A;  . Tonsillectomy    . Laparoscopy N/A 12/20/2013    Procedure: LAPAROSCOPY DIAGNOSTIC ;  Surgeon: [name], MD;  Location: [place];  Service: General;  Laterality: N/A;  . Cholecystectomy N/A 12/20/2013    Procedure: LAPAROSCOPIC CHOLECYSTECTOMY;  Surgeon: [name], MD;  Location: [place];  Service: General;  Laterality: N/A;  . Splenectomy, total N/A 12/20/2013    Procedure: SPLENECTOMY;  Surgeon: [name], MD;  Location: [place];   Service: General;  Laterality: N/A;  . Partial gastrectomy N/A 12/20/2013    Procedure: PARTIAL GASTRECTOMY;  Surgeon: [name], MD;  Location: [place];  Service: General;  Laterality: N/A;  . Portacath placement N/A 01/15/2014    Procedure: INSERTION PORT-A-CATH;  Surgeon: [name], MD;  Location: [facility];  Service: General;  Laterality: N/A;  . Flexible sigmoidoscopy N/A 07/29/2015    Procedure: FLEXIBLE SIGMOIDOSCOPY;  Surgeon: Milus Banister, MD;  Location: [facility];  Service: Endoscopy;  Laterality: N/A;  . Laparotomy N/A 07/22/2015    Procedure: partial colectomy, splenic flexure take down, partial gastrectomy;  Surgeon: [name], MD;  Location: [facility];  Service: General;  Laterality: N/A;     Allergies  Allergies  Allergen Reactions  . Tape     Allergic  To  Tegaderm.  . Codeine Nausea And Vomiting  . Lactose Intolerance (Gi)   . Tequin     Severe stomach pain  . Vancomycin Itching    Whelps on arm of Infusing IV site  . Penicillins Nausea Only and Rash    HPI  69 y/o female with no past cardiac history. Her history is notable for recurrent pancreatic cancer now s/p Exploratory laparotomy, partial colectomy, splenic flexure takedown, partial gastrectomy, 07/25/2015, by Dr. Barry Dienes.    On 07/15/15 she developed acute respiratory distress, delirium and shock. WBC was severely elevated  in the 35k range. She required intubation. From her post-op drain, there was noted to be drainage of feculent material thus patient was felt to be in septic shock. As part of additional w/u, cardiac enzymes were cycled and were elevated x 3 and peaked at 2.24. Subsequently, a 2D echo was obtained 07/16/15 which showed mild to moderately reduced systolic function with an EF of 40-45%. There was also a calcified apical false tendon versus possible organized thrombus. There is anterior, anteroseptal and apical hypokinesis suggestive of LAD territory ischemia/infarct. This is new compared to prior  study in 2012. Her hospital course has also been complicated by afib w/ RVR, but she is currently in NSR.  She remains intubated and no family is currently present by beside, thus no known report of any chest pain prior to recent events.    Home Medications  Prior to Admission medications   Medication Sig Start Date End Date Taking? Authorizing Provider  ALPRAZolam Duanne Moron) 0.5 MG tablet Take 1 tablet (0.5 mg total) by mouth at bedtime as needed for anxiety. 01/15/15  Yes [name], MD  antiseptic oral rinse (BIOTENE) LIQD 15 mLs by Mouth Rinse route as needed for dry mouth.   Yes Historical Provider, MD  aspirin EC 81 MG tablet Take 81 mg by mouth every morning.    Yes Historical Provider, MD  b complex vitamins tablet Take 1 tablet by mouth every morning.    Yes Historical Provider, MD  Biotin 1 MG CAPS Take 1 capsule by mouth daily at 12 noon.    Yes Historical Provider, MD  CREON 12000 UNITS CPEP capsule TAKE TWO CAPSULES THREE TIMES DAILY WITH MEALS 01/09/15  Yes Chauncey Cruel, MD  diphenhydrAMINE (BENADRYL) 25 MG tablet Take 25 mg by mouth at bedtime as needed for sleep.    Yes Historical Provider, MD  loratadine (CLARITIN) 10 MG tablet Take 10 mg by mouth daily as needed for allergies.    Yes Historical Provider, MD  Multiple Vitamins-Minerals (PRESERVISION AREDS PO) Take 1 capsule by mouth 2 (two) times daily.   Yes Historical Provider, MD  omeprazole (PRILOSEC) 40 MG capsule Take 1 capsule (40 mg total) by mouth daily. 08/15/14  Yes Chauncey Cruel, MD  ondansetron (ZOFRAN) 8 MG tablet Take 1 tablet (8 mg total) by mouth every 8 (eight) hours as needed for nausea or vomiting. 07/03/15  Yes Susanne Borders, NP  PARoxetine HCl (PAXIL PO) Take 1 tablet by mouth daily.   Yes Historical Provider, MD  PARoxetine (PAXIL) 20 MG tablet Take 2 tablets (40 mg total) by mouth daily. Patient not taking: Reported on 07/06/2015 03/23/15   Chauncey Cruel, MD    Family History  Family History   Problem Relation Age of Onset  . Colon cancer Neg Hx   . Aneurysm Mother   . Stroke Mother   . Prostate cancer Father 67  . Skin cancer Brother 77    treated with interferon for 1 year  . Heart failure Paternal Aunt   . Breast cancer Paternal Aunt     dx over 27s  . Pancreatic cancer Maternal Grandmother     dx in her 26s  . Breast cancer Maternal Aunt     dx over 54  . Rheum arthritis Brother   . Breast cancer Paternal Aunt     dx over 64s  . Leukemia Paternal Aunt     Social History  Social History   Social History  . Marital  Status: Married    Spouse Name: N/A  . Number of Children: 2  . Years of Education: N/A   Occupational History  . Self employed     Social History Main Topics  . Smoking status: Former Smoker -- 0.50 packs/day for 28 years    Types: Cigarettes    Quit date: 11/02/2000  . Smokeless tobacco: Never Used  . Alcohol Use: Yes     Comment: occ wine   . Drug Use: No  . Sexual Activity: Not on file   Other Topics Concern  . Not on file   Social History Narrative   2 caffeine drinks daily      Review of Systems General:  No chills, fever, night sweats or weight changes.  Cardiovascular:  No chest pain, dyspnea on exertion, edema, orthopnea, palpitations, paroxysmal nocturnal dyspnea. Dermatological: No rash, lesions/masses Respiratory: No cough, dyspnea Urologic: No hematuria, dysuria Abdominal:   No nausea, vomiting, diarrhea, bright red blood per rectum, melena, or hematemesis Neurologic:  No visual changes, wkns, changes in mental status. All other systems reviewed and are otherwise negative except as noted above.  Physical Exam  Blood pressure 87/58, pulse 110, temperature 99.7 F (37.6 C), temperature source Oral, resp. rate 18, height 5' (1.524 m), weight 201 lb 15.1 oz (91.6 kg), SpO2 98 %.  General: Intubated, ill appearing Psych: intubated Neuro: intubated HEENT: Normal  Neck: Supple without bruits or JVD. Lungs:  Resp  regular and unlabored, CTA. Heart: RRR no s3, s4, or murmurs. Abdomen: Soft, non-tender, non-distended, BS + x 4.  Extremities: No clubbing, cyanosis or edema. DP/PT/Radials 2+ and equal bilaterally.  Labs  Troponin (Point of Care Test) No results for input(s): TROPIPOC in the last 72 hours.  Recent Labs  07/15/15 0835 07/16/15 1250 07/16/15 1815 07/16/15 2320  TROPONINI 1.03* 2.24* 1.83* 1.25*   Lab Results  Component Value Date   WBC 35.6* 07/17/2015   HGB 8.5* 07/17/2015   HCT 23.9* 07/17/2015   MCV 90.2 07/17/2015   PLT 451* 07/17/2015    Recent Labs Lab 07/17/15 0515  NA 136  K 3.6  CL 106  CO2 24  BUN 18  CREATININE 0.77  CALCIUM 7.1*  PROT 4.2*  BILITOT 0.8  ALKPHOS 86  ALT 239*  AST 372*  GLUCOSE 115*   Lab Results  Component Value Date   CHOL 211* 01/05/2013   HDL 75 01/05/2013   LDLCALC 113* 01/05/2013   TRIG 88 07/17/2015   No results found for: DDIMER   Radiology/Studies  Mr Abdomen W Wo Contrast  08/01/2015   CLINICAL DATA:  Pancreatic carcinoma. Rising cancer enzymes. Bowel obstruction. Evaluate for metastatic disease in the liver or pancreas.  EXAM: MRI ABDOMEN WITHOUT AND WITH CONTRAST  TECHNIQUE: Multiplanar multisequence MR imaging of the abdomen was performed both before and after the administration of intravenous contrast.  CONTRAST:  34mL MULTIHANCE GADOBENATE DIMEGLUMINE 529 MG/ML IV SOLN  COMPARISON:  08/01/2015 CT.  MRI of 03/16/2015.  PET of 05/13/2015.  FINDINGS: Mild motion degradation.  Lower chest: Apparent nodularity within the right upper lung on image 74 of series 15 could relate to a atelectasis. No nodules readily apparent on the prior PET. Small bilateral pleural effusions with adjacent atelectasis. Mild cardiomegaly.  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 Heterogeneous steatosis throughout the liver. Diminutive left portal vein, including on image 29 of series 14002. Probable  perfusion anomaly within the hepatic dome, similar. No findings to suggest hepatic metastasis.  Cholecystectomy with similar upper normal intrahepatic ducts. Hepatic veins patent.  Pancreas: Status post distal pancreatectomy, with no evidence of pancreatic head or uncinate process mass.  Spleen: Splenectomy.  Adrenals/Urinary Tract: Normal adrenal glands. Interpolar left renal lesion is too small to characterize. Normal right kidney, without hydronephrosis.  Stomach/Bowel: Gastric body is underdistended. Wall thickening which is likely secondary. The greater curvature of the stomach is intimately associated with the splenic flexure of the colon. Example image 50 of series 15. This is at the site of colonic obstruction on the prior CT. Colonic obstruction is resolved. Transverse colon is normal caliber. There is mild hyper enhancement and wall thickening involving the transverse colon. Example image 74 of series 1402. Small bowel caliber is normal.  Vascular/Lymphatic: Aortic and branch vessel atherosclerosis. Prominent peripancreatic nodes are not significantly changed. Example 9 mm portal caval node on image 29 of series 3, similar. Gastrohepatic ligament node measures 7 mm on image 31 of series 14004 and is unchanged.  Other: No ascites.  No evidence of omental or peritoneal disease.  Musculoskeletal: Fatty marrow within the upper lumbar spine is likely radiation induced.  IMPRESSION: 1. Redemonstration of distal pancreatectomy, splenectomy, cholecystectomy. No evidence of locally recurrent or metastatic disease. 2. 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 3. similar presumed perfusion anomaly within the right lobe of the liver. 4. Resolved obstruction at the splenic flexure of the colon. Suspect adhesion between the stomach and splenic flexure . Mucosal hyper enhancement involving the transverse colon, suggesting colitis. 5. Small bilateral pleural effusions. Apparent nodularity in the right lung could  be due to atelectasis. No pulmonary nodules on the PET of 05/13/2015.   Electronically Signed   By: Abigail Miyamoto M.D.   On: 07/14/2015 17:01   Ct Abdomen Pelvis W Contrast  07/15/2015   CLINICAL DATA:  Subsequent encounter for 69 year old female with history of pancreatic cancer, status post distal pancreatectomy and splenectomy with partial gastrectomy 1 year ago. Patient more recently presented with colonic obstruction secondary to recurrent pancreatic cancer. Status post exploratory laparotomy with partial colectomy, splenic flexure takedown, and partial gastrectomy on 08/01/2015. Now with leukocytosis and acute respiratory failure.  EXAM: CT ABDOMEN AND PELVIS WITH CONTRAST  TECHNIQUE: Multidetector CT imaging of the abdomen and pelvis was performed using the standard protocol following bolus administration of intravenous contrast.  CONTRAST:  151mL OMNIPAQUE IOHEXOL 300 MG/ML  SOLN  COMPARISON:  07/17/2015  FINDINGS: Lower chest: Imaging through the lower chest shows bilateral lower lobe collapse/consolidation with small to moderate bilateral pleural effusions.  Hepatobiliary: Diffuse low-attenuation of the liver parenchyma is again noted, compatible with steatosis. No focal or discrete intrahepatic parenchymal abnormality. Gallbladder is surgically absent. No intrahepatic or extrahepatic biliary dilation.  Pancreas: The patient is again noted be status post distal pancreatectomy. Pancreatic head has normal CT imaging features. There is no substantial edema or inflammation within the anterior para renal space.  Spleen: Surgically absent.  Adrenals/Urinary Tract: No adrenal nodule or mass. No evidence for hydronephrosis. No enhancing lesion in either kidney. No hydroureter. Foley catheter decompresses the urinary bladder and gas within the bladder is compatible with the presence of the Foley device.  Stomach/Bowel: Stomach is nondistended. NG tube tip is in the region of the gastric antrum. Deformity and wall  irregularity in the posterior aspect the proximal stomach is associated with surgical suture, compatible with the recent surgery/resection. There is no evidence for gross extravasation of enteric contrast from the posterior stomach. Careful evaluation of the [place] drain (  best evaluated on coronal reformations) shows low attenuation in the lumen without convincing evidence for contrast material within the surgical drain. Duodenum is nondilated. There is some oral contrast material in proximal to mid small bowel which is nondistended. The terminal ileum is normal. The appendix is not visualized, but there is no edema or inflammation in the region of the cecum.  The colon is collapsed throughout. A colonic suture line is seen in the left paramidline abdomen compatible with side to side anastomosis after splenic flexure takedown. There is no edema, fluid, or inflammation immediately adjacent to the colonic anastomosis. No gas is seen within the pericolonic fat in the region of the anastomosis.  Vascular/Lymphatic: There is abdominal aortic atherosclerosis without aneurysm. There is no gastrohepatic or hepatoduodenal ligament lymphadenopathy. No intraperitoneal or retroperitoneal lymphadenopy. No pelvic sidewall lymphadenopathy.  Reproductive: Uterus is unremarkable.  There is no adnexal mass.  Other: There is a small to moderate amount of intraperitoneal free fluid. In the abdomen, much of the fluid is seen around the liver and an area anterior to the lateral segment of the left liver (image 23 series 2) measures 4.1 x 8.5 cm and contains gas. Gas is also seen in the fluid adjacent to the right liver in just inferior to the tip of the liver. Small volume of fluid is seen each paracolic gutter. There is also free fluid in the anatomic pelvis.  Moderate free air is seen in the abdomen with a right-sided predominance. Some loculation are seen scattered diffusely in the mesentery of the mid and upper abdomen.   Musculoskeletal: Bone windows reveal no worrisome lytic or sclerotic osseous lesions.  IMPRESSION: 1. No evidence for contrast extravasation from the stomach to suggest gastric leak. Careful evaluation of the surgical drain reveals no high attenuation material within the actual lumen of the drain (best evaluated on coronal reformations) to suggest evacuation of a contrast leak through the drainage catheter. 2. No evidence for wall thickening in the region of the colonic anastomosis. There is no fluid or gas accumulation in the region of the colonic anastomosis to suggest leak. 3. Status post distal pancreatectomy. No evidence for edema or inflammation in the retroperitoneal tissues along the pancreatic clips. 4. Bilateral lower lobe collapse/consolidation with small to moderate bilateral pleural effusions. Bilateral pneumonia could certainly have this appearance. 5. Moderate intraperitoneal fluid, mainly collected around the liver in the abdomen and in the cul-de-sac of the pelvis. No clear etiology for the fluid. 6. Small to moderate intraperitoneal free air. The volume of gas is not unexpected 4 days out from surgery and while somewhat prominent, is within the spectrum of expected findings on postoperative day 4 from exploratory laparotomy. 7. No evidence at this time of an organized or rim enhancing collection of fluid/gas to suggest evolving abscess. 8. I discussed these findings by telephone with Dr.Byerly at approximately 1330 hours on 07/15/2015.   Electronically Signed   By: Misty Stanley M.D.   On: 07/15/2015 13:36   Ct Abdomen Pelvis W Contrast  07/31/2015   CLINICAL DATA:  Upper and lower abdominal pain starting on Monday. Bloating. Diarrhea. Nausea. Pancreatic cancer diagnosed in January, 2005 2015, with prior Whipple procedure, chemotherapy, and radiation. Remote breast cancer.  EXAM: CT ABDOMEN AND PELVIS WITH CONTRAST  TECHNIQUE: Multidetector CT imaging of the abdomen and pelvis was performed using  the standard protocol following bolus administration of intravenous contrast.  CONTRAST:  157mL OMNIPAQUE IOHEXOL 300 MG/ML  SOLN  COMPARISON:  05/13/2015  FINDINGS:  Lower chest:  Trace right pleural effusion with passive atelectasis.  Hepatobiliary: Steatosis primarily involving the right hepatic lobe but with some bandlike steatosis along parts of the left hepatic lobe. Hyper enhancement in the left hepatic lobe, chronic, likely due to the diminutive left portal vein causing delayed drainage.  In the dome a segment 8, a small enhancing lesion is stable and likely a perfusion anomaly. Gallbladder absent.  Pancreas: Clips are noted in the expected location of the pancreas were there is a band of fibrosis or tissue along the expected spot of the pancreatic tail and body. No increase in the volume of soft tissue in this vicinity, which was previously not significantly hypermetabolic on PET-CT. Pancreatic head unremarkable.  Spleen: Splenectomy  Adrenals/Urinary Tract: Unremarkable  Stomach/Bowel: Partial gastrectomy. Along the gastrectomy staple line in the left upper quadrant, there is an angled loop of splenic flexure of the colon marking a transition point between dilated and nondilated colon. The distal small bowel is also dilated. Appearance compatible with colonic obstruction due to adhesion. There is wall thickening in the colon leading up to the transition point, as well as wall thickening in the distal ileum and scattered air-fluid levels. Interestingly there are some air- fluid levels in the nondilated distal colon as well, particularly in the sigmoid.  Vascular/Lymphatic: Aortoiliac atherosclerotic vascular disease. The celiac trunk appears stenotic.  Reproductive: Unremarkable  Other: Mesenteric edema along the pelvic small bowel loops, as on image 63 series 2.  Musculoskeletal: Supraumbilical ventral hernia contains adipose tissue.  IMPRESSION: 1. Bowel obstruction at the level of the splenic flexure the  colon due to an adhesion along the adjacent partial gastrectomy staple line. At this point there is a transition between dilated and thick-walled colon and the nondilated distal colon. There are air-fluid levels diffusely in the colon and small bowel, and dilation of the distal small bowel, with edema in the mesentery of several small bowel loops. 2. No specific findings of pancreatic cancer recurrence. 3. Trace right pleural effusion. 4. Geographic hepatic steatosis. Hyper enhancement of the left hepatic lobe due to left portal vein diminutive size.   Electronically Signed   By: Van Clines M.D.   On: 07/12/2015 21:42   Dg Chest Port 1 View  07/15/2015   CLINICAL DATA:  Central line placement.  EXAM: PORTABLE CHEST - 1 VIEW  COMPARISON:  07/14/2015  FINDINGS: The endotracheal to is 1 cm above the carina and could be retracted 2 cm. The NG tube is coursing down the esophagus and into the stomach. The right IJ central venous catheter tip is in the distal SVC. Persistent cardiac enlargement, tortuous calcified thoracic aorta and slightly prominent mediastinal contours. There are persistent bilateral pleural effusions and bilateral lower lobe airspace consolidation.  IMPRESSION: 1. The endotracheal tube is 1 cm above the carina. 2. The right IJ central venous catheter tip is in the distal SVC. 3. Persistent cardiac enlargement, bilateral pleural effusions and bilateral lower lobe airspace consolidation.   Electronically Signed   By: Marijo Sanes M.D.   On: 07/15/2015 10:26   Dg Chest Port 1 View  07/14/2015   CLINICAL DATA:  69 year old female with a history of shortness of breath, fever  EXAM: PORTABLE CHEST - 1 VIEW  COMPARISON:  PET-CT 05/13/2015, 11/20/2014, chest x-ray 11/08/2014  FINDINGS: Cardiomediastinal silhouette partially obscured by overlying lung/ pleural disease.  Since the prior plain film 11/08/2014, there has been interval development of bibasilar opacity obscuring the left greater than  right hemidiaphragm  and heart borders. Ill-defined interstitial opacities.  Interval placement of gastric tube.  No displaced fracture.  IMPRESSION: Interval development of left greater than right pleural effusions with associated interstitial and airspace disease concerning for multifocal infection and parapneumonic effusion. Given the oncologic history, follow-up PA and lateral chest X-ray is recommended in 3-4 weeks following trial of antibiotic therapy to ensure resolution and exclude underlying malignancy.  Interval placement of gastric tube.  Signed,  Dulcy Fanny. Earleen Newport, DO  [facility]  [facility]   Electronically Signed   By: Corrie Mckusick D.O.   On: 07/14/2015 12:16   Dg Abd 2 Views  07/10/2015   CLINICAL DATA:  New bowel obstruction. Pain has resolved. History pancreatic cancer and Whipple procedure.  EXAM: ABDOMEN - 2 VIEW  COMPARISON:  MRI 08/01/2015.  CT 07/20/2015.  FINDINGS: Soft tissue structures unremarkable. Surgical clips upper abdomen. New interim near complete resolution of bowel distention. No free air. Pelvic calcifications consistent phleboliths. Degenerative changes lumbar spine. Bibasilar pleural parenchymal thickening noted consistent with scarring.  IMPRESSION: Interim near complete resolution of bowel distention.   Electronically Signed   By: Marcello Moores  Register   On: 07/10/2015 09:07   Dg Abd 2 Views  07/08/2015   CLINICAL DATA:  Followup colonic obstruction. Status post Whipple procedure for pancreatic cancer in 2015 with splenectomy and partial gastric resection.  EXAM: ABDOMEN - 2 VIEW  COMPARISON:  Abdomen and pelvis CT obtained yesterday.  FINDINGS: Again demonstrated is dilatation of the transverse colon to the level of the splenic flexure. This appears slightly improved. There is also slightly less dilatation of small bowel loops. Multiple air-fluid levels on the upright view in the colon and small bowel. No free peritoneal air.  Excreted contrast in the urinary bladder. Upper abdominal surgical clips and staples. Small bilateral pleural effusions. Probable atelectasis at both lung bases.  IMPRESSION: 1. Slightly improved pattern of colonic obstruction at the level of the splenic flexure. 2. Small bilateral pleural effusions. 3. Mild bibasilar atelectasis.   Electronically Signed   By: Claudie Revering M.D.   On: 07/08/2015 11:09   US Abdomen Limited Ruq  07/16/2015   CLINICAL DATA:  Transaminitis. History of a Whipple procedure including a cholecystectomy.  EXAM: US ABDOMEN LIMITED - RIGHT UPPER QUADRANT  COMPARISON:  CT, 07/15/2015  FINDINGS: Gallbladder:  Surgically absent  Common bile duct:  Diameter: 1.8 mm  Liver:  Coarse echotexture with diffusely increased echogenicity. Surface nodularity. Decreased through transmission of the sound beam somewhat limiting examination of the posterior liver. No discrete liver mass or focal lesion. Hepatopetal flow documented in the portal vein. There is surrounding ascites.  IMPRESSION: 1. Liver abnormal in appearance consistent with at least hepatic steatosis. Cirrhosis should be considered as suggested by the surface nodularity and coarsened echotexture. No discrete liver mass or focal lesion is seen. 2. No bile duct dilation. 3. Ascites.  Status post cholecystectomy.   Electronically Signed   By: Lajean Manes M.D.   On: 07/16/2015 16:58    ECG Atrial fibrillation w/ RVR Telemetry currently shows NSR  Echocardiogram  Study Conclusions  - Left ventricle: The cavity size was normal. Wall thickness was normal. Systolic function was mildly to moderately reduced. The estimated ejection fraction was in the range of 40% to 45%. Calcified apical false tendon versus possible organized thrombus. There is anterior, anteroseptal and apical hypokinesis suggestive of LAD territory ischemia/infarct. Doppler parameters are consistent with abnormal left ventricular relaxation (grade  1 diastolic  dysfunction). The E/e&' ratio is between 8-15, suggesting indeterminate LV filling pressure. - Aortic valve: Sclerosis without stenosis. There was trivial regurgitation. - Left atrium: The atrium was normal in size.  Impressions:  - Compared to a prior echo in 2012, there is a new anterior, anteroseptal and apical wall motion abnomality suggestive of LAD territory ischemia/infarct. LVEF is reduced to 40-45%. There is either a calcified false tendon or small organized thrombus at the apex. Suggest limited definity contrast study at some point to further characterize the LV apex.    ASSESSMENT AND PLAN  Active Problems:   Colonic obstruction   Respiratory failure   Septic shock   Transaminitis  69 y/o female with recurrent pancreatic cancer now s/p exploratory laparotomy, partial colectomy, splenic flexure takedown, partial gastrectomy, 07/09/2015, by Dr. Barry Dienes. Post recovery complicated by development of septic shock and acute respiratory failure requiring intubation. Found to have elevated troponin's with a peak of 2.24 and echo findings suggestive of LAD territory ischemia/infarct with reduced EF at 40-45%. Also with paroxysmal afib w/ RVR. Dr. Radford Pax to see who will provide recommendations.    Signed, Lyda Jester, PA-C 07/17/2015, 9:52 AM

## 2015-07-17 NOTE — Progress Notes (Signed)
Patient ID: Annette Beltran, female   DOB: [birth year], 69 y.o.   MRN: [id number] 6 Days Post-Op partial colectomy, splenic flexure take down, partial gastrectomy   Subjective: Switched to neo from norepi due to tachycardia.    Objective: Vital signs in last 24 hours: Temp:  [98.2 F (36.8 C)-101.4 F (38.6 C)] 99.7 F (37.6 C) (09/14 0400) Pulse Rate:  [42-138] 110 (09/14 0811) Resp:  [16-26] 18 (09/14 0811) BP: (68-129)/(26-86) 87/58 mmHg (09/14 0811) SpO2:  [91 %-98 %] 98 % (09/14 0811) FiO2 (%):  [40 %] 40 % (09/14 0811) Weight:  [91.6 kg (201 lb 15.1 oz)] 91.6 kg (201 lb 15.1 oz) (09/14 0300) Last BM Date: 07/06/2015  Intake/Output from previous day: 09/13 0701 - 09/14 0700 In: 4938.2 [I.V.:2079.7; NG/GT:290; IV Piggyback:1490; TPN:1058.5] Out: 1180 [Urine:965; Emesis/NG output:75; Drains:140] Intake/Output this shift:    General appearance:intubated, sedated   Resp: comfortable on vent Cardio: RR&R GI: soft, non distended, no real change in mild tenderness.   JP Murky, cloudy Incision: no erythema  Lab Results:   Recent Labs  07/16/15 0515 07/17/15 0515  WBC 33.8* 35.6*  HGB 8.9* 8.5*  HCT 25.5* 23.9*  PLT 549* 451*   BMET  Recent Labs  07/16/15 0515 07/17/15 0515  NA 136 136  K 3.8 3.6  CL 107 106  CO2 23 24  GLUCOSE 379* 115*  BUN 21* 18  CREATININE 0.93 0.77  CALCIUM 7.2* 7.1*   PT/INR No results for input(s): LABPROT, INR in the last 72 hours. ABG  Recent Labs  07/15/15 0918  PHART 7.451*  HCO3 17.4*    Studies/Results: Ct Abdomen Pelvis W Contrast  07/15/2015   CLINICAL DATA:  Subsequent encounter for 69 year old female with history of pancreatic cancer, status post distal pancreatectomy and splenectomy with partial gastrectomy 1 year ago. Patient more recently presented with colonic obstruction secondary to recurrent pancreatic cancer. Status post exploratory laparotomy with partial colectomy, splenic flexure takedown, and partial  gastrectomy on 07/22/2015. Now with leukocytosis and acute respiratory failure.  EXAM: CT ABDOMEN AND PELVIS WITH CONTRAST  TECHNIQUE: Multidetector CT imaging of the abdomen and pelvis was performed using the standard protocol following bolus administration of intravenous contrast.  CONTRAST:  120mL OMNIPAQUE IOHEXOL 300 MG/ML  SOLN  COMPARISON:  07/22/2015  FINDINGS: Lower chest: Imaging through the lower chest shows bilateral lower lobe collapse/consolidation with small to moderate bilateral pleural effusions.  Hepatobiliary: Diffuse low-attenuation of the liver parenchyma is again noted, compatible with steatosis. No focal or discrete intrahepatic parenchymal abnormality. Gallbladder is surgically absent. No intrahepatic or extrahepatic biliary dilation.  Pancreas: The patient is again noted be status post distal pancreatectomy. Pancreatic head has normal CT imaging features. There is no substantial edema or inflammation within the anterior para renal space.  Spleen: Surgically absent.  Adrenals/Urinary Tract: No adrenal nodule or mass. No evidence for hydronephrosis. No enhancing lesion in either kidney. No hydroureter. Foley catheter decompresses the urinary bladder and gas within the bladder is compatible with the presence of the Foley device.  Stomach/Bowel: Stomach is nondistended. NG tube tip is in the region of the gastric antrum. Deformity and wall irregularity in the posterior aspect the proximal stomach is associated with surgical suture, compatible with the recent surgery/resection. There is no evidence for gross extravasation of enteric contrast from the posterior stomach. Careful evaluation of the Blake drain (best evaluated on coronal reformations) shows low attenuation in the lumen without convincing evidence for contrast material within the surgical drain. Duodenum  is nondilated. There is some oral contrast material in proximal to mid small bowel which is nondistended. The terminal ileum is  normal. The appendix is not visualized, but there is no edema or inflammation in the region of the cecum.  The colon is collapsed throughout. A colonic suture line is seen in the left paramidline abdomen compatible with side to side anastomosis after splenic flexure takedown. There is no edema, fluid, or inflammation immediately adjacent to the colonic anastomosis. No gas is seen within the pericolonic fat in the region of the anastomosis.  Vascular/Lymphatic: There is abdominal aortic atherosclerosis without aneurysm. There is no gastrohepatic or hepatoduodenal ligament lymphadenopathy. No intraperitoneal or retroperitoneal lymphadenopy. No pelvic sidewall lymphadenopathy.  Reproductive: Uterus is unremarkable.  There is no adnexal mass.  Other: There is a small to moderate amount of intraperitoneal free fluid. In the abdomen, much of the fluid is seen around the liver and an area anterior to the lateral segment of the left liver (image 23 series 2) measures 4.1 x 8.5 cm and contains gas. Gas is also seen in the fluid adjacent to the right liver in just inferior to the tip of the liver. Small volume of fluid is seen each paracolic gutter. There is also free fluid in the anatomic pelvis.  Moderate free air is seen in the abdomen with a right-sided predominance. Some loculation are seen scattered diffusely in the mesentery of the mid and upper abdomen.  Musculoskeletal: Bone windows reveal no worrisome lytic or sclerotic osseous lesions.  IMPRESSION: 1. No evidence for contrast extravasation from the stomach to suggest gastric leak. Careful evaluation of the surgical drain reveals no high attenuation material within the actual lumen of the drain (best evaluated on coronal reformations) to suggest evacuation of a contrast leak through the drainage catheter. 2. No evidence for wall thickening in the region of the colonic anastomosis. There is no fluid or gas accumulation in the region of the colonic anastomosis to  suggest leak. 3. Status post distal pancreatectomy. No evidence for edema or inflammation in the retroperitoneal tissues along the pancreatic clips. 4. Bilateral lower lobe collapse/consolidation with small to moderate bilateral pleural effusions. Bilateral pneumonia could certainly have this appearance. 5. Moderate intraperitoneal fluid, mainly collected around the liver in the abdomen and in the cul-de-sac of the pelvis. No clear etiology for the fluid. 6. Small to moderate intraperitoneal free air. The volume of gas is not unexpected 4 days out from surgery and while somewhat prominent, is within the spectrum of expected findings on postoperative day 4 from exploratory laparotomy. 7. No evidence at this time of an organized or rim enhancing collection of fluid/gas to suggest evolving abscess. 8. I discussed these findings by telephone with Dr.Halima Fogal at approximately 1330 hours on 07/15/2015.   Electronically Signed   By: Misty Stanley M.D.   On: 07/15/2015 13:36   Dg Chest Port 1 View  07/15/2015   CLINICAL DATA:  Central line placement.  EXAM: PORTABLE CHEST - 1 VIEW  COMPARISON:  07/14/2015  FINDINGS: The endotracheal to is 1 cm above the carina and could be retracted 2 cm. The NG tube is coursing down the esophagus and into the stomach. The right IJ central venous catheter tip is in the distal SVC. Persistent cardiac enlargement, tortuous calcified thoracic aorta and slightly prominent mediastinal contours. There are persistent bilateral pleural effusions and bilateral lower lobe airspace consolidation.  IMPRESSION: 1. The endotracheal tube is 1 cm above the carina. 2. The right IJ central  venous catheter tip is in the distal SVC. 3. Persistent cardiac enlargement, bilateral pleural effusions and bilateral lower lobe airspace consolidation.   Electronically Signed   By: Marijo Sanes M.D.   On: 07/15/2015 10:26   US Abdomen Limited Ruq  07/16/2015   CLINICAL DATA:  Transaminitis. History of a Whipple  procedure including a cholecystectomy.  EXAM: US ABDOMEN LIMITED - RIGHT UPPER QUADRANT  COMPARISON:  CT, 07/15/2015  FINDINGS: Gallbladder:  Surgically absent  Common bile duct:  Diameter: 1.8 mm  Liver:  Coarse echotexture with diffusely increased echogenicity. Surface nodularity. Decreased through transmission of the sound beam somewhat limiting examination of the posterior liver. No discrete liver mass or focal lesion. Hepatopetal flow documented in the portal vein. There is surrounding ascites.  IMPRESSION: 1. Liver abnormal in appearance consistent with at least hepatic steatosis. Cirrhosis should be considered as suggested by the surface nodularity and coarsened echotexture. No discrete liver mass or focal lesion is seen. 2. No bile duct dilation. 3. Ascites.  Status post cholecystectomy.   Electronically Signed   By: Lajean Manes M.D.   On: 07/16/2015 16:58    Anti-infectives: Anti-infectives    Start     Dose/Rate Route Frequency Ordered Stop   07/16/15 1000  anidulafungin (ERAXIS) 100 mg in sodium chloride 0.9 % 100 mL IVPB     100 mg over 90 Minutes Intravenous Every 24 hours 07/15/15 0925     07/15/15 1000  imipenem-cilastatin (PRIMAXIN) 500 mg in sodium chloride 0.9 % 100 mL IVPB     500 mg 200 mL/hr over 30 Minutes Intravenous Every 8 hours 07/15/15 0856     07/15/15 1000  linezolid (ZYVOX) IVPB 600 mg     600 mg 300 mL/hr over 60 Minutes Intravenous Every 12 hours 07/15/15 0905     07/15/15 1000  anidulafungin (ERAXIS) 200 mg in sodium chloride 0.9 % 200 mL IVPB     200 mg over 180 Minutes Intravenous  Once 07/15/15 0925 07/15/15 1406   07/30/2015 2000  ceFAZolin (ANCEF) IVPB 2 g/50 mL premix     2 g 100 mL/hr over 30 Minutes Intravenous 3 times per day 07/16/2015 1628 07/04/2015 2349   07/10/2015 0600  ceFAZolin (ANCEF) IVPB 2 g/50 mL premix     2 g 100 mL/hr over 30 Minutes Intravenous On call to O.R. 07/10/15 0951 07/14/2015 1125      Assessment/Plan: s/p Procedure(s): partial  colectomy, splenic flexure take down, partial gastrectomy (N/A) Definitely residual cancer in abdomen.  Several nodules on bowel/stomach/peritoneum.  Not all resectable STRICT NPO/NGT for gastric resection, drain  Broad spectrum antibiotics. Pneumonia, ? Subclinical leak Plan for upper GI  today    LOS: 10 days    [facility] 07/17/2015

## 2015-07-17 NOTE — Progress Notes (Signed)
CRITICAL VALUE ALERT  Critical value received:  Lactic 2.6  Date of notification:  07/17/15  Time of notification:  0015  Critical value read back:Yes.    Nurse who received alert:  Leola Brazil, RN  MD notified (1st page):  Dr. Jimmy Footman  Time of first page:  Lyda Perone  MD notified (2nd page):  Time of second page:  Responding MD:  Dr. Jimmy Footman  Time MD responded:  Called e-link [phone]

## 2015-07-17 NOTE — Progress Notes (Signed)
Assessed Patient for PICC line placement. Attempted to accesses the veins in upper left arm with out success due to weeping edematous . Right  arm restricted. Marni Griffon NP made aware.

## 2015-07-17 NOTE — Progress Notes (Signed)
ANTICOAGULATION CONSULT NOTE - Initial Consult  Pharmacy Consult for Heparin Indication: atrial fibrillation, possible MI  Allergies  Allergen Reactions  . Tape     Allergic  To  Tegaderm.  . Codeine Nausea And Vomiting  . Lactose Intolerance (Gi)   . Tequin     Severe stomach pain  . Vancomycin Itching    Whelps on arm of Infusing IV site  . Penicillins Nausea Only and Rash    Patient Measurements: Height: 5' (152.4 cm) Weight: 201 lb 15.1 oz (91.6 kg) IBW/kg (Calculated) : 45.5 Heparin Dosing Weight: 67 kg  Vital Signs: Temp: 99.6 F (37.6 C) (09/14 1200) Temp Source: Axillary (09/14 1200) BP: 102/52 mmHg (09/14 1203) Pulse Rate: 72 (09/14 1203)  Labs:  Recent Labs  07/15/15 0459  07/16/15 0515 07/16/15 1250 07/16/15 1815 07/16/15 2320 07/17/15 0515  HGB 9.8*  --  8.9*  --   --   --  8.5*  HCT 28.9*  --  25.5*  --   --   --  23.9*  PLT 612*  --  549*  --   --   --  451*  CREATININE 0.94  --  0.93  --   --   --  0.77  TROPONINI  --   < >  --  2.24* 1.83* 1.25*  --   < > = values in this interval not displayed.  Estimated Creatinine Clearance: 65.1 mL/min (by C-G formula based on Cr of 0.77).   Medical History: Past Medical History  Diagnosis Date  . Macular degeneration   . Fluid retention   . Colitis, collagenous   . Vitamin D deficiency   . Osteopenia   . Celiac artery stenosis   . Splenic infarction   . Arthritis   . History of blood clots   . Anxiety   . Shingles   . Pancreatitis   . Osteoporosis   . Clotting disorder   . Arrhythmia     "skips a beat"  Labauer  heart  . GERD (gastroesophageal reflux disease)   . Breast cancer     rt lumpectomy      Assessment: 43 yoF s/p partial colectomy, splenic flexure take down, partial gastrectomy. Transferred to ICU 9/12 for tachycardia, tachpnea, and hypotension. Concern for bowel perforation.  Intubated on TPN, antibiotics, pressors.  Troponins elevated, went into afib with RVR last night.   Cardiology consulted and recommending heparin infusion for possible MI, possible LV thrombus, and afib.  No bolus per surgery.  Hgb 8.5, platelets 451K SCr WNL, stable  Goal of Therapy:  Heparin level 0.3-0.7 units/ml Monitor platelets by anticoagulation protocol: Yes   Plan:  1.  Start heparin infusion at 1000 units/hr. 2.  Check heparin level 8 hours after start. 3.  Daily HL and CBC while on heparin infusion.  Hershal Coria 07/17/2015,1:06 PM

## 2015-07-17 NOTE — Progress Notes (Signed)
ANTIBIOTIC CONSULT NOTE - Follow Up  Pharmacy Consult for Primaxin, Zyvox, Anidulafungin Indication: PNA, IAI  Allergies  Allergen Reactions  . Tape     Allergic  To  Tegaderm.  . Codeine Nausea And Vomiting  . Lactose Intolerance (Gi)   . Tequin     Severe stomach pain  . Vancomycin Itching    Whelps on arm of Infusing IV site  . Penicillins Nausea Only and Rash    Patient Measurements: Height: 5' (152.4 cm) Weight: 201 lb 15.1 oz (91.6 kg) IBW/kg (Calculated) : 45.5 Adjusted Body Weight:   Vital Signs: Temp: 99.6 F (37.6 C) (09/14 1200) Temp Source: Axillary (09/14 1200) BP: 102/52 mmHg (09/14 1203) Pulse Rate: 72 (09/14 1203) Intake/Output from previous day: 09/13 0701 - 09/14 0700 In: 4938.2 [I.V.:2079.7; NG/GT:290; IV Piggyback:1490; TPN:1058.5] Out: 1180 [Urine:965; Emesis/NG output:75; Drains:140] Intake/Output from this shift:    Labs:  Recent Labs  07/15/15 0459 07/16/15 0515 07/17/15 0515  WBC 48.8* 33.8* 35.6*  HGB 9.8* 8.9* 8.5*  PLT 612* 549* 451*  CREATININE 0.94 0.93 0.77   Estimated Creatinine Clearance: 65.1 mL/min (by C-G formula based on Cr of 0.77). No results for input(s): VANCOTROUGH, VANCOPEAK, VANCORANDOM, GENTTROUGH, GENTPEAK, GENTRANDOM, TOBRATROUGH, TOBRAPEAK, TOBRARND, AMIKACINPEAK, AMIKACINTROU, AMIKACIN in the last 72 hours.   Microbiology: Recent Results (from the past 720 hour(s))  C difficile quick scan w PCR reflex     Status: None   Collection Time: 07/04/15 12:19 PM  Result Value Ref Range Status   C Diff antigen NEGATIVE NEGATIVE Final   C Diff toxin NEGATIVE NEGATIVE Final   C Diff interpretation Negative for toxigenic C. difficile  Final  Surgical pcr screen     Status: None   Collection Time: 07/10/15  6:03 PM  Result Value Ref Range Status   MRSA, PCR NEGATIVE NEGATIVE Final   Staphylococcus aureus NEGATIVE NEGATIVE Final    Comment:        The Xpert SA Assay (FDA approved for NASAL specimens in patients  over 71 years of age), is one component of a comprehensive surveillance program.  Test performance has been validated by [facility] [place] for patients greater than or equal to 71 year old. It is not intended to diagnose infection nor to guide or monitor treatment.     Medical History: Past Medical History  Diagnosis Date  . Macular degeneration   . Fluid retention   . Colitis, collagenous   . Vitamin D deficiency   . Osteopenia   . Celiac artery stenosis   . Splenic infarction   . Arthritis   . History of blood clots   . Anxiety   . Shingles   . Pancreatitis   . Osteoporosis   . Clotting disorder   . Arrhythmia     "skips a beat"  Labauer  heart  . GERD (gastroesophageal reflux disease)   . Breast cancer     rt lumpectomy    Assessment: 69 yoF with recurrent pancreatic cancer s/p exploratory laparotomy, partial colectomy, splenic flexure takedown, partial gastrectomy on 07/07/2015.  On 9/12, POD4, transferred to ICU for tachycardia, tachpnea, and hypotension. Feculent odor on breath and from abdominal drain.  Concern for pneumonia, bowel leak.  Dr. Barry Dienes ordered Primaxin and Vancomycin per pharmacy.  Patient had allergic reaction with whelps immediately following vancomycin dose in 10/2014.  Spoke with Dr. Barry Dienes, will change Vancomycin to Zyvox.  She also wanted fungal coverage so will add anidulafungin.    9/12 >> Primaxin >>  9/12 >> Zyvox >> 9/12 >> Anidulafungin >>  Today, 07/17/2015:  Tmax 101.4 WBC elevated SCr decreased to 0.77, CrCl~75 ml/min (normalized), CrCl~92CG Patient intubated, on norepinephrine. Urine culture ordered.  Goal of Therapy:  Doses adjusted per renal function Eradication of infection  Plan:  Adjust Primaxin 500 mg IV to q6h for CrCl>71 ml/min (normalized) Continue Zyvox 600 mg IV q12h Continue Andidulafungin 100 mg daily (after 200 mg loading dose on day 1) Following daily Order Blood culture?  Hershal Coria 07/17/2015,12:42 PM

## 2015-07-18 DIAGNOSIS — I4891 Unspecified atrial fibrillation: Secondary | ICD-10-CM

## 2015-07-18 DIAGNOSIS — C259 Malignant neoplasm of pancreas, unspecified: Secondary | ICD-10-CM | POA: Insufficient documentation

## 2015-07-18 LAB — URINE CULTURE
Culture: 20000
Special Requests: NORMAL

## 2015-07-18 MED ORDER — LORAZEPAM 2 MG/ML IJ SOLN
2.0000 mg | INTRAMUSCULAR | Status: DC | PRN
  Administered 2015-07-18: 2 mg via INTRAVENOUS

## 2015-07-18 MED ORDER — LORAZEPAM 2 MG/ML IJ SOLN
INTRAMUSCULAR | Status: AC
Start: 2015-07-18 — End: 2015-07-18
  Administered 2015-07-18: 2 mg via INTRAVENOUS
  Filled 2015-07-18: qty 1

## 2015-07-19 LAB — LIPASE, FLUID: LIPASE-FLUID: 14 U/L

## 2015-08-03 NOTE — Discharge Summary (Signed)
Death Note: For complete accounting of the patient's history and physical on presentation please refer to the dictated H&P. In brief the patient was a 69 year old female with a history of pancreatic adenocarcinoma status post laparoscopic cholecystectomy, open ended distal pancreatectomy, splenectomy, partial gastrectomy in February 2015. Patient was admitted with a high-grade obstruction at the splenic flexure and rising CA 19-9. On 07/12/2015 she underwent partial colectomy with splenic flexure takedown and partial gastrectomy. Postoperative course was further complicated with acute hypoxic respiratory failure and septic shock on 07/15/15 requiring emergent endotracheal intubation and initiation of vasopressor support. Patient was initiated on broad-spectrum antibiotics as well as antifungal coverage given her recent abdominal surgery. As her case progressed she developed atrial fibrillation with rapid ventricular response lactic acidosis. Cardiology was consulted on 9/14 and echocardiogram revealed anterior, anteroseptal, & apical hypokinesis suggestive of LAD territory ischemia/infarct. A long discussion was held between Salvadore Dom, NP; Lurline Del, MD; & [name], MD. The family wished to transition to a DO NOT RESUSCITATE status and proceed with full comfort care and terminal extubation given her multiple medical problems and Stage IV pancreatic cancer. Vasopressor support was discontinued and the patient was terminally extubated on the morning of 2015/07/28. In reviewing the patient's chart she passed around 11:00 AM after extubation at 10:58 AM with hospital chaplain at bedside at that time comforting family.  Diagnoses at Death: 1. Acute Hypoxic Respiratory Failure 2. Acute Liver Failure 3. Acute Myocardial Infarction 4. Atrial Fibrillation with Rapid Ventricular Response 5. Septic Shock with Multi-System Organ Failure 6. Stage IV Pancreatic Cancer 7. Toxic Metabolic Encephalopathy 8.  Anemia 9. H/O Breast Cancer  Annette Beltran. Ashok Cordia, M.D. [facility] Pager:  [phone] After 3pm or if no response, call [phone]

## 2015-08-03 NOTE — Progress Notes (Signed)
Date:  07-24-15 U.R. performed for needs and level of care. Will continue to follow for Case Management needs.  Velva Harman, RN, BSN, [place]   [phone]

## 2015-08-03 NOTE — Procedures (Signed)
Extubation Procedure Note  Patient Details:   Name: Annette Beltran DOB: September 19, 1946 MRN: [id number]   Airway Documentation:     Evaluation  O2 sats: transiently fell during during procedure Complications: No apparent complications Patient did tolerate procedure well. Bilateral Breath Sounds: Expiratory wheezes Suctioning: Airway No  Annette Beltran 26-Jul-2015, 10:58 AM

## 2015-08-03 NOTE — Progress Notes (Signed)
PULMONARY / CRITICAL CARE MEDICINE   Name: Annette Beltran MRN: [id number] DOB: 1946/07/05    ADMISSION DATE:  07/27/2015 CONSULTATION DATE:  9/12  REFERRING MD :  Barry Dienes   CHIEF COMPLAINT:  Acute respiratory failure   INITIAL PRESENTATION:  69 year old female w/ Recurrent pancreatic cancer now s/p  Exploratory laparotomy, partial colectomy, splenic flexure takedown, partial gastrectomy, 07/11/15, Dr. Barry Dienes. Post-op course largely unremarkable w/ exception of leukocytosis until the AM of 9/12 when developed acute respiratory failure, delirium and subsequent shock. Feculent material draining from post-op drain. PCCM asked to assist w/ care.   STUDIES:  CT abd/pelvis 9/12: 1. No evidence for contrast extravasation from the stomach to suggest gastric leak. Careful evaluation of the surgical drain reveals no high attenuation material within the actual lumen of the drain (best evaluated on coronal reformations) to suggest evacuation of a contrast leak through the drainage catheter.2. No evidence for wall thickening in the region of the colonic anastomosis. There is no fluid or gas accumulation in the region of the colonic anastomosis to suggest leak. 3. Status post distal pancreatectomy. No evidence for edema or inflammation in the retroperitoneal tissues along the pancreatic clips. 4. Bilateral lower lobe collapse/consolidation with small to moderate bilateral pleural effusions. Bilateral pneumonia could certainly have this appearance. 5. Moderate intraperitoneal fluid, mainly collected around the liver in the abdomen and in the cul-de-sac of the pelvis. No clear etiology for the fluid. 9/13: [place] abd:  Liver abnormal in appearance consistent with at least hepatic steatosis. Cirrhosis should be considered as suggested by the surface nodularity and coarsened echotexture. No discrete liver mass 0/09 ECHO: Systolic function was mildly to moderately reduced. Theestimated ejection fraction was in the range  of 40% to 45%.Calcified apical false tendon versus possible organized thrombus.There is anterior, anteroseptal and apical hypokinesis suggestiveof LAD territory ischemia/infarct. Doppler parameters areconsistent with abnormal left ventricular relaxation (grade 1diastolic dysfunction) SIGNIFICANT EVENTS: 9/8: Exploratory laparotomy, partial colectomy, splenic flexure takedown, partial gastrectomy 9/12:  developed acute respiratory failure, delirium and subsequent shock. Feculent material draining from post-op drain. PCCM asked to assist w/ care. Intubated. CVL placed. ABX widened Fluid challenged and CT abd ordered.  9/13: CT results w/out leak. Pressors weaning. FIO2 weaning. TNA started. WBCs trending down. Stopping fent gtt to see how much is sedation vs residual sepsis driving Levo requirements.  9/13: AF w/ RVR. CEs peaked at 2.24. ECHO suggesting depressed EF and ant/lat plus apical akinesis.  9/14: still on and off pressors. No sig change  9/14: long group/family discussion. Family decided to switch to comfort only. Labs and meds stopped. Morphine started.   SUBJECTIVE:  No sig change, appears comfortable on vent and morphine gtt.   VITAL SIGNS: Temp:  [98.8 F (37.1 C)-99.6 F (37.6 C)] 98.8 F (37.1 C) (09/14 1600) Pulse Rate:  [52-99] 70 (09/15 0900) Resp:  [16-22] 16 (09/15 0900) BP: (76-172)/(42-113) 107/67 mmHg (09/14 1700) SpO2:  [89 %-99 %] 93 % (09/15 0900) FiO2 (%):  [40 %] 40 % (09/15 0846) HEMODYNAMICS:   VENTILATOR SETTINGS: Vent Mode:  [-] PRVC FiO2 (%):  [40 %] 40 % Set Rate:  [16 bmp] 16 bmp Vt Set:  [500 mL] 500 mL PEEP:  [5 cmH20] 5 cmH20 Plateau Pressure:  [25 cmH20-39 cmH20] 39 cmH20 INTAKE / OUTPUT:  Intake/Output Summary (Last 24 hours) at 07/19/15 0927 Last data filed at 19-Jul-2015 0919  Gross per 24 hour  Intake 1892.54 ml  Output    390 ml  Net  1502.54 ml    PHYSICAL EXAMINATION: General:  Sedated on vent , opens eyes w/ cough  Neuro:   Sedated, non-focal  HEENT:  NCAT, now orally intubated w/ 7.5 ETT Cardiovascular:  Tachy rrr No MRG Lungs: scattered rhonchi Abdomen:  Incision intact, not tender to palp, hypoactive BS; feculent material draining from JP drain persists -->still Musculoskeletal:  Intact  Skin:  Diaphoretic but intact, worsening anasarca  LABS:  CBC  Recent Labs Lab 07/15/15 0459 07/16/15 0515 07/17/15 0515  WBC 48.8* 33.8* 35.6*  HGB 9.8* 8.9* 8.5*  HCT 28.9* 25.5* 23.9*  PLT 612* 549* 451*   Coag's No results for input(s): APTT, INR in the last 168 hours. BMET  Recent Labs Lab 07/15/15 0459 07/16/15 0515 07/17/15 0515  NA 146* 136 136  K 4.8 3.8 3.6  CL 112* 107 106  CO2 24 23 24   BUN 19 21* 18  CREATININE 0.94 0.93 0.77  GLUCOSE 142* 379* 115*   Electrolytes  Recent Labs Lab 07/15/15 0459 07/16/15 0515 07/17/15 0515  CALCIUM 7.6* 7.2* 7.1*  MG 2.0 1.9 1.7  PHOS 3.4 1.4* 2.5   Sepsis Markers  Recent Labs Lab 07/16/15 1250 07/16/15 1815 07/16/15 2320  LATICACIDVEN 2.8* 1.9 2.6*   ABG  Recent Labs Lab 07/15/15 0918  PHART 7.451*  PCO2ART 25.4*  PO2ART 230*   Liver Enzymes  Recent Labs Lab 07/16/15 0515 07/17/15 0515  AST 1078* 372*  ALT 342* 239*  ALKPHOS 80 86  BILITOT 0.8 0.8  ALBUMIN 1.5* 1.4*   Cardiac Enzymes  Recent Labs Lab 07/16/15 1250 07/16/15 1815 07/16/15 2320  TROPONINI 2.24* 1.83* 1.25*   Glucose  Recent Labs Lab 07/16/15 2218 07/17/15 0034 07/17/15 0340 07/17/15 0810 07/17/15 1225 07/17/15 1611  GLUCAP 149* 134* 124* 96 140* 130*    Imaging Dg Abd Portable 1v  07/17/2015   CLINICAL DATA:  Recent bowel obstruction, recurrent pancreatic cancer, post laparotomy on 07/06/2015  EXAM: PORTABLE ABDOMEN - 1 VIEW  COMPARISON:  Portable exam 1214 hours compared to 07/17/2015 and correlated with CT exam of 07/15/2015  FINDINGS: Small amount retained contrast in LEFT upper quadrant, appears to be contained within stomach.   Nasogastric tube present with tip projecting over antrum.  Surgical drain noted in LEFT upper quadrant.  No definite extravasated GI contrast.  Mild bibasilar atelectasis versus infiltrate.  Additional retained contrast noted within ascending colon, which appears somewhat featureless question related to underdistention or wall thickening.  No evidence of bowel obstruction or dilatation.  Persistent large gas collection projecting over the superior aspect of the liver, corresponding to large anterior subdiaphragmatic gas collection identified on recent CT.  IMPRESSION: Nonobstructive bowel gas pattern.  Unable to exclude bowel wall thickening of the ascending colon.  Persistent gas within a large subdiaphragmatic RIGHT upper quadrant collection projecting over the liver, recommend attention on follow-up exams.   Electronically Signed   By: Lavonia Dana M.D.   On: 07/17/2015 15:51   Dg Abd Portable 1v  07/17/2015   CLINICAL DATA:  Patient with prior partial gastrectomy. History of pancreatic carcinoma.  EXAM: PORTABLE ABDOMEN - 1 VIEW  COMPARISON:  CT abdomen pelvis 07/04/2015  FINDINGS: Three abdominal radiographs were obtained prior to and after the administration of 50 cc Omnipaque 300 via enteric tube. Initial radiograph demonstrates enteric tube within the left upper quadrant. Percutaneous drainage catheter coiled within the left upper quadrant. Surgical staple line within the mid abdomen. Residual oral contrast within the right lower quadrant. Left lung  base is clear. Relative paucity of bowel gas.  After administration contrast material, contrast is demonstrated likely within the lumen of the stomach in the left upper quadrant. The contrast appears to be confined to the stomach. No contrast is identified distally to interrogate the small bowel.  IMPRESSION: Administered oral contrast material appears to be within the stomach without evidence for gross extravasation on single AP view. More posterior contrast  extravasation cannot be excluded as the patient was lying supine for radiographs. Recommend an additional follow-up radiograph in 15 at 20 minutes to assess for interval progression of oral contrast material and continued evaluation for the possibility of leak.  These results were called by telephone at the time of interpretation on 07/17/2015 to Dr. [name] , who verbally acknowledged these results.   Electronically Signed   By: Lovey Newcomer M.D.   On: 07/17/2015 15:02   Dg Abd Portable 1v  07/17/2015   CLINICAL DATA:  Patient with prior partial gastrectomy. History of pancreatic carcinoma.  EXAM: PORTABLE ABDOMEN - 1 VIEW  COMPARISON:  CT abdomen pelvis 07/04/2015  FINDINGS: Three abdominal radiographs were obtained prior to and after the administration of 50 cc Omnipaque 300 via enteric tube. Initial radiograph demonstrates enteric tube within the left upper quadrant. Percutaneous drainage catheter coiled within the left upper quadrant. Surgical staple line within the mid abdomen. Residual oral contrast within the right lower quadrant. Left lung base is clear. Relative paucity of bowel gas.  After administration contrast material, contrast is demonstrated likely within the lumen of the stomach in the left upper quadrant. The contrast appears to be confined to the stomach. No contrast is identified distally to interrogate the small bowel.  IMPRESSION: Administered oral contrast material appears to be within the stomach without evidence for gross extravasation on single AP view. More posterior contrast extravasation cannot be excluded as the patient was lying supine for radiographs. Recommend an additional follow-up radiograph in 15 at 20 minutes to assess for interval progression of oral contrast material and continued evaluation for the possibility of leak.  These results were called by telephone at the time of interpretation on 07/17/2015 to Dr. [name] , who verbally acknowledged these results.    Electronically Signed   By: Lovey Newcomer M.D.   On: 07/17/2015 15:02   Dg Abd Portable 1v  07/17/2015   CLINICAL DATA:  Patient with prior partial gastrectomy. History of pancreatic carcinoma.  EXAM: PORTABLE ABDOMEN - 1 VIEW  COMPARISON:  CT abdomen pelvis 07/04/2015  FINDINGS: Three abdominal radiographs were obtained prior to and after the administration of 50 cc Omnipaque 300 via enteric tube. Initial radiograph demonstrates enteric tube within the left upper quadrant. Percutaneous drainage catheter coiled within the left upper quadrant. Surgical staple line within the mid abdomen. Residual oral contrast within the right lower quadrant. Left lung base is clear. Relative paucity of bowel gas.  After administration contrast material, contrast is demonstrated likely within the lumen of the stomach in the left upper quadrant. The contrast appears to be confined to the stomach. No contrast is identified distally to interrogate the small bowel.  IMPRESSION: Administered oral contrast material appears to be within the stomach without evidence for gross extravasation on single AP view. More posterior contrast extravasation cannot be excluded as the patient was lying supine for radiographs. Recommend an additional follow-up radiograph in 15 at 20 minutes to assess for interval progression of oral contrast material and continued evaluation for the possibility of leak.  These  results were called by telephone at the time of interpretation on 07/17/2015 to Dr. [name] , who verbally acknowledged these results.   Electronically Signed   By: Lovey Newcomer M.D.   On: 07/17/2015 15:02     ASSESSMENT / PLAN: Recurrent pancreatic cancer with Exploratory laparotomy, partial colectomy, splenic flexure takedown, partial gastrectomy, 07/13/2015, Dr. Barry Dienes Intubated for acute respiratory distress in the setting of septic shock, and bibasilar airspace disease (ATX and effusion) Can't exclude aspiration event.  Septic shock:  presume this is abd source due w/ feculent material now draining from [place].  Ant/lateral MI w/ new systemic CM.  PAF w/ RVR-->converted back to ST  Hypernatremia -->normalized  Intermittent Hypophosphatemia  Elevated LFTs-->prob shock liver  Severe leukocytosis  Anemia of critical illness -->hgb stable as of 9/14 Thrombocytosis -->stable 9/14 Hyperglycemia  Post-op pain Acute encephalopathy   Discussion  Recurrent pancreatic cancer with Exploratory laparotomy, partial colectomy, splenic flexure takedown, partial gastrectomy, 07/17/2015, Dr. Barry Dienes Intubated for acute respiratory distress in the setting of septic shock, most likely due to abd sepsis (feculent material from abd drain). Can't exclude aspiration event. Course complicated by Acute MI, acute encephalopathy, shock liver. Family has decided to transition to comfort. Doubt she will survive long after extubation.   Plan Now on morphine gtt Plan for terminal extubation later today Titrate morphine as needed.  On-going family support  Erick Colace ACNP-BC [place] Pager # [phone] OR # [phone] if no answer    07-30-2015, 9:27 AM

## 2015-08-03 NOTE — Progress Notes (Signed)
Extubated a and expired at 57 this amj Date:  August 08, 2015 U.R. performed for needs and level of care. Will continue to follow for Case Management needs.  Velva Harman, RN, BSN, [place]   [phone]

## 2015-08-03 NOTE — Progress Notes (Signed)
  Cardiology stopped by to round on patient, but patient's family has decided for comfort care only. Will respect theses wishes. IV heparin was discontinued last night.   Annette Beltran

## 2015-08-03 NOTE — Progress Notes (Signed)
Patient ID: Annette Beltran, female   DOB: 1946-07-26, 69 y.o.   MRN: [id number] 7 Days Post-Op partial colectomy, splenic flexure take down, partial gastrectomy   Subjective: Based on overall prognosis and recent MI, family has opted for comfort care only.    Objective: Vital signs in last 24 hours: Temp:  [98.8 F (37.1 C)-99.6 F (37.6 C)] 98.8 F (37.1 C) (09/14 1600) Pulse Rate:  [52-117] 62 (09/14 1900) Resp:  [16-22] 16 (09/14 1900) BP: (76-172)/(42-113) 107/67 mmHg (09/14 1700) SpO2:  [89 %-99 %] 98 % (09/15 0308) FiO2 (%):  [40 %] 40 % (09/15 0308) Last BM Date: 07/30/2015  Intake/Output from previous day: 09/14 0701 - 09/15 0700 In: 1368.5 [I.V.:638.5; IV Piggyback:730] Out: 390 [Urine:350; Drains:40] Intake/Output this shift:    General appearance:intubated, sedated   Resp: comfortable on vent Cardio: RR&R GI: soft, non distended JP Murky, cloudy Incision: no erythema  Lab Results:   Recent Labs  07/16/15 0515 07/17/15 0515  WBC 33.8* 35.6*  HGB 8.9* 8.5*  HCT 25.5* 23.9*  PLT 549* 451*   BMET  Recent Labs  07/16/15 0515 07/17/15 0515  NA 136 136  K 3.8 3.6  CL 107 106  CO2 23 24  GLUCOSE 379* 115*  BUN 21* 18  CREATININE 0.93 0.77  CALCIUM 7.2* 7.1*   PT/INR No results for input(s): LABPROT, INR in the last 72 hours. ABG  Recent Labs  07/15/15 0918  PHART 7.451*  HCO3 17.4*    Studies/Results: Dg Abd Portable 1v  07/17/2015   CLINICAL DATA:  Recent bowel obstruction, recurrent pancreatic cancer, post laparotomy on 07/26/2015  EXAM: PORTABLE ABDOMEN - 1 VIEW  COMPARISON:  Portable exam 1214 hours compared to 07/17/2015 and correlated with CT exam of 07/15/2015  FINDINGS: Small amount retained contrast in LEFT upper quadrant, appears to be contained within stomach.  Nasogastric tube present with tip projecting over antrum.  Surgical drain noted in LEFT upper quadrant.  No definite extravasated GI contrast.  Mild bibasilar atelectasis  versus infiltrate.  Additional retained contrast noted within ascending colon, which appears somewhat featureless question related to underdistention or wall thickening.  No evidence of bowel obstruction or dilatation.  Persistent large gas collection projecting over the superior aspect of the liver, corresponding to large anterior subdiaphragmatic gas collection identified on recent CT.  IMPRESSION: Nonobstructive bowel gas pattern.  Unable to exclude bowel wall thickening of the ascending colon.  Persistent gas within a large subdiaphragmatic RIGHT upper quadrant collection projecting over the liver, recommend attention on follow-up exams.   Electronically Signed   By: Lavonia Dana M.D.   On: 07/17/2015 15:51   Dg Abd Portable 1v  07/17/2015   CLINICAL DATA:  Patient with prior partial gastrectomy. History of pancreatic carcinoma.  EXAM: PORTABLE ABDOMEN - 1 VIEW  COMPARISON:  CT abdomen pelvis 07/04/2015  FINDINGS: Three abdominal radiographs were obtained prior to and after the administration of 50 cc Omnipaque 300 via enteric tube. Initial radiograph demonstrates enteric tube within the left upper quadrant. Percutaneous drainage catheter coiled within the left upper quadrant. Surgical staple line within the mid abdomen. Residual oral contrast within the right lower quadrant. Left lung base is clear. Relative paucity of bowel gas.  After administration contrast material, contrast is demonstrated likely within the lumen of the stomach in the left upper quadrant. The contrast appears to be confined to the stomach. No contrast is identified distally to interrogate the small bowel.  IMPRESSION: Administered oral contrast material appears  to be within the stomach without evidence for gross extravasation on single AP view. More posterior contrast extravasation cannot be excluded as the patient was lying supine for radiographs. Recommend an additional follow-up radiograph in 15 at 20 minutes to assess for interval  progression of oral contrast material and continued evaluation for the possibility of leak.  These results were called by telephone at the time of interpretation on 07/17/2015 to Dr. [name] , who verbally acknowledged these results.   Electronically Signed   By: Lovey Newcomer M.D.   On: 07/17/2015 15:02   Dg Abd Portable 1v  07/17/2015   CLINICAL DATA:  Patient with prior partial gastrectomy. History of pancreatic carcinoma.  EXAM: PORTABLE ABDOMEN - 1 VIEW  COMPARISON:  CT abdomen pelvis 07/04/2015  FINDINGS: Three abdominal radiographs were obtained prior to and after the administration of 50 cc Omnipaque 300 via enteric tube. Initial radiograph demonstrates enteric tube within the left upper quadrant. Percutaneous drainage catheter coiled within the left upper quadrant. Surgical staple line within the mid abdomen. Residual oral contrast within the right lower quadrant. Left lung base is clear. Relative paucity of bowel gas.  After administration contrast material, contrast is demonstrated likely within the lumen of the stomach in the left upper quadrant. The contrast appears to be confined to the stomach. No contrast is identified distally to interrogate the small bowel.  IMPRESSION: Administered oral contrast material appears to be within the stomach without evidence for gross extravasation on single AP view. More posterior contrast extravasation cannot be excluded as the patient was lying supine for radiographs. Recommend an additional follow-up radiograph in 15 at 20 minutes to assess for interval progression of oral contrast material and continued evaluation for the possibility of leak.  These results were called by telephone at the time of interpretation on 07/17/2015 to Dr. [name] , who verbally acknowledged these results.   Electronically Signed   By: Lovey Newcomer M.D.   On: 07/17/2015 15:02   Dg Abd Portable 1v  07/17/2015   CLINICAL DATA:  Patient with prior partial gastrectomy. History of  pancreatic carcinoma.  EXAM: PORTABLE ABDOMEN - 1 VIEW  COMPARISON:  CT abdomen pelvis 07/04/2015  FINDINGS: Three abdominal radiographs were obtained prior to and after the administration of 50 cc Omnipaque 300 via enteric tube. Initial radiograph demonstrates enteric tube within the left upper quadrant. Percutaneous drainage catheter coiled within the left upper quadrant. Surgical staple line within the mid abdomen. Residual oral contrast within the right lower quadrant. Left lung base is clear. Relative paucity of bowel gas.  After administration contrast material, contrast is demonstrated likely within the lumen of the stomach in the left upper quadrant. The contrast appears to be confined to the stomach. No contrast is identified distally to interrogate the small bowel.  IMPRESSION: Administered oral contrast material appears to be within the stomach without evidence for gross extravasation on single AP view. More posterior contrast extravasation cannot be excluded as the patient was lying supine for radiographs. Recommend an additional follow-up radiograph in 15 at 20 minutes to assess for interval progression of oral contrast material and continued evaluation for the possibility of leak.  These results were called by telephone at the time of interpretation on 07/17/2015 to Dr. [name] , who verbally acknowledged these results.   Electronically Signed   By: Lovey Newcomer M.D.   On: 07/17/2015 15:02   US Abdomen Limited Ruq  07/16/2015   CLINICAL DATA:  Transaminitis. History of  a Whipple procedure including a cholecystectomy.  EXAM: US ABDOMEN LIMITED - RIGHT UPPER QUADRANT  COMPARISON:  CT, 07/15/2015  FINDINGS: Gallbladder:  Surgically absent  Common bile duct:  Diameter: 1.8 mm  Liver:  Coarse echotexture with diffusely increased echogenicity. Surface nodularity. Decreased through transmission of the sound beam somewhat limiting examination of the posterior liver. No discrete liver mass or focal  lesion. Hepatopetal flow documented in the portal vein. There is surrounding ascites.  IMPRESSION: 1. Liver abnormal in appearance consistent with at least hepatic steatosis. Cirrhosis should be considered as suggested by the surface nodularity and coarsened echotexture. No discrete liver mass or focal lesion is seen. 2. No bile duct dilation. 3. Ascites.  Status post cholecystectomy.   Electronically Signed   By: Lajean Manes M.D.   On: 07/16/2015 16:58    Anti-infectives: Anti-infectives    Start     Dose/Rate Route Frequency Ordered Stop   07/17/15 1800  imipenem-cilastatin (PRIMAXIN) 500 mg in sodium chloride 0.9 % 100 mL IVPB  Status:  Discontinued     500 mg 200 mL/hr over 30 Minutes Intravenous 4 times per day 07/17/15 1239 07/17/15 1241   07/17/15 1600  imipenem-cilastatin (PRIMAXIN) 500 mg in sodium chloride 0.9 % 100 mL IVPB  Status:  Discontinued     500 mg 200 mL/hr over 30 Minutes Intravenous Every 6 hours 07/17/15 1241 07/17/15 1643   07/16/15 1000  anidulafungin (ERAXIS) 100 mg in sodium chloride 0.9 % 100 mL IVPB  Status:  Discontinued     100 mg over 90 Minutes Intravenous Every 24 hours 07/15/15 0925 07/17/15 1643   07/15/15 1000  imipenem-cilastatin (PRIMAXIN) 500 mg in sodium chloride 0.9 % 100 mL IVPB  Status:  Discontinued     500 mg 200 mL/hr over 30 Minutes Intravenous Every 8 hours 07/15/15 0856 07/17/15 1239   07/15/15 1000  linezolid (ZYVOX) IVPB 600 mg  Status:  Discontinued     600 mg 300 mL/hr over 60 Minutes Intravenous Every 12 hours 07/15/15 0905 07/17/15 1643   07/15/15 1000  anidulafungin (ERAXIS) 200 mg in sodium chloride 0.9 % 200 mL IVPB     200 mg over 180 Minutes Intravenous  Once 07/15/15 0925 07/15/15 1406   07/26/2015 2000  ceFAZolin (ANCEF) IVPB 2 g/50 mL premix     2 g 100 mL/hr over 30 Minutes Intravenous 3 times per day 07/28/2015 1628 07/21/2015 2349   07/06/2015 0600  ceFAZolin (ANCEF) IVPB 2 g/50 mL premix     2 g 100 mL/hr over 30 Minutes  Intravenous On call to O.R. 07/10/15 0951 07/30/2015 1125      Assessment/Plan: s/p Procedure(s): partial colectomy, splenic flexure take down, partial gastrectomy (N/A) Definitely residual cancer in abdomen.  Several nodules on bowel/stomach/peritoneum.  Not all resectable  Comfort care only. Discussed with husband.   Very reasonable with incurable pancreatic cancer remaining in abdomen, MI, and significant recovery that would be required if she did survive this episode of critical illness.     LOS: 11 days    Lavelle Akel 29-Jul-2015

## 2015-08-03 NOTE — Progress Notes (Signed)
   07/25/2015 1100  Clinical Encounter Type  Visited With Family  Visit Type Follow-up;Psychological support;Spiritual support;Death  Referral From Nurse  Consult/Referral To Chaplain  Spiritual Encounters  Spiritual Needs Grief support;Emotional  Stress Factors  Family Stress Factors Loss   The Chaplain was told by the nurse that the pt., Griselda Miner, had been extubated and had recently passed. Her husband was in the room, as well as their pastor and 3 nurses.  The husband was visibly upset and tearful.  Chaplain intervention included emotional support and pastoral conversation with the husband.

## 2015-08-03 DEATH — deceased

## 2015-08-12 ENCOUNTER — Other Ambulatory Visit: Payer: Medicare Other

## 2015-09-11 ENCOUNTER — Other Ambulatory Visit: Payer: Medicare Other

## 2015-10-31 ENCOUNTER — Ambulatory Visit (HOSPITAL_COMMUNITY): Payer: Medicare Other

## 2015-11-05 ENCOUNTER — Other Ambulatory Visit: Payer: Medicare Other

## 2015-11-07 ENCOUNTER — Ambulatory Visit: Payer: Medicare Other | Admitting: Oncology

## 2015-12-06 ENCOUNTER — Other Ambulatory Visit: Payer: Self-pay | Admitting: Oncology

## 2016-09-01 ENCOUNTER — Other Ambulatory Visit: Payer: Self-pay | Admitting: Oncology
